# Patient Record
Sex: Male | Born: 1965 | Race: Black or African American | Hispanic: No | Marital: Married | Smoking: Never smoker
Health system: Southern US, Community
[De-identification: ages and names within clinical notes are randomized; demographics above are authoritative.]

## PROBLEM LIST (undated history)

## (undated) DIAGNOSIS — E11621 Type 2 diabetes mellitus with foot ulcer: Secondary | ICD-10-CM

## (undated) DIAGNOSIS — Z8619 Personal history of other infectious and parasitic diseases: Secondary | ICD-10-CM

## (undated) DIAGNOSIS — E119 Type 2 diabetes mellitus without complications: Secondary | ICD-10-CM

## (undated) DIAGNOSIS — J189 Pneumonia, unspecified organism: Secondary | ICD-10-CM

## (undated) DIAGNOSIS — H53002 Unspecified amblyopia, left eye: Secondary | ICD-10-CM

## (undated) DIAGNOSIS — I2510 Atherosclerotic heart disease of native coronary artery without angina pectoris: Secondary | ICD-10-CM

## (undated) DIAGNOSIS — Z87442 Personal history of urinary calculi: Secondary | ICD-10-CM

## (undated) DIAGNOSIS — N182 Chronic kidney disease, stage 2 (mild): Secondary | ICD-10-CM

## (undated) DIAGNOSIS — E1165 Type 2 diabetes mellitus with hyperglycemia: Secondary | ICD-10-CM

## (undated) DIAGNOSIS — I428 Other cardiomyopathies: Secondary | ICD-10-CM

## (undated) DIAGNOSIS — I5022 Chronic systolic (congestive) heart failure: Secondary | ICD-10-CM

## (undated) DIAGNOSIS — I509 Heart failure, unspecified: Secondary | ICD-10-CM

## (undated) DIAGNOSIS — Z86718 Personal history of other venous thrombosis and embolism: Secondary | ICD-10-CM

## (undated) DIAGNOSIS — I10 Essential (primary) hypertension: Secondary | ICD-10-CM

## (undated) HISTORY — DX: Type 2 diabetes mellitus without complications: E11.9

## (undated) HISTORY — DX: Essential (primary) hypertension: I10

## (undated) HISTORY — DX: Personal history of urinary calculi: Z87.442

## (undated) HISTORY — PX: EYE SURGERY: SHX253

## (undated) HISTORY — DX: Chronic kidney disease, stage 2 (mild): N18.2

## (undated) HISTORY — DX: Chronic systolic (congestive) heart failure: I50.22

## (undated) HISTORY — DX: Other cardiomyopathies: I42.8

## (undated) HISTORY — DX: Type 2 diabetes mellitus with foot ulcer: E11.621

## (undated) HISTORY — DX: Atherosclerotic heart disease of native coronary artery without angina pectoris: I25.10

## (undated) HISTORY — DX: Unspecified amblyopia, left eye: H53.002

## (undated) HISTORY — DX: Personal history of other infectious and parasitic diseases: Z86.19

## (undated) HISTORY — DX: Personal history of other venous thrombosis and embolism: Z86.718

## (undated) HISTORY — DX: Type 2 diabetes mellitus with hyperglycemia: E11.65

---

## 2011-05-29 ENCOUNTER — Emergency Department (HOSPITAL_COMMUNITY): Payer: Self-pay

## 2011-05-29 ENCOUNTER — Emergency Department (HOSPITAL_COMMUNITY)
Admission: EM | Admit: 2011-05-29 | Discharge: 2011-05-29 | Disposition: A | Discharge status: Home / Self Care | Payer: Self-pay | Attending: Emergency Medicine | Admitting: Emergency Medicine

## 2011-05-29 ENCOUNTER — Encounter (HOSPITAL_COMMUNITY): Payer: Self-pay | Admitting: Emergency Medicine

## 2011-05-29 DIAGNOSIS — W1789XA Other fall from one level to another, initial encounter: Secondary | ICD-10-CM | POA: Insufficient documentation

## 2011-05-29 DIAGNOSIS — M79603 Pain in arm, unspecified: Secondary | ICD-10-CM

## 2011-05-29 DIAGNOSIS — J45909 Unspecified asthma, uncomplicated: Secondary | ICD-10-CM | POA: Insufficient documentation

## 2011-05-29 DIAGNOSIS — M79609 Pain in unspecified limb: Secondary | ICD-10-CM | POA: Insufficient documentation

## 2011-05-29 MED ORDER — IBUPROFEN 800 MG PO TABS
800.0000 mg | ORAL_TABLET | Freq: Once | ORAL | Status: AC
  Administered 2011-05-29: 800 mg via ORAL
  Filled 2011-05-29: qty 1

## 2011-05-29 NOTE — Discharge Instructions (Signed)
Please use splint and ibuprofen. Decreased the use of the left upper extremity. Call Dr. Mina Marble' S office for followup if the immobilization does not decrease the pain in 2-3 days.   Return if worse at any time especially if you have increased redness swelling or line up the arm.Hypertension As your heart beats, it forces blood through your arteries. This force is your blood pressure. If the pressure is too high, it is called hypertension (HTN) or high blood pressure. HTN is dangerous because you may have it and not know it. High blood pressure may mean that your heart has to work harder to pump blood. Your arteries may be narrow or stiff. The extra work puts you at risk for heart disease, stroke, and other problems.  Blood pressure consists of two numbers, a higher number over a lower, 110/72, for example. It is stated as "110 over 72." The ideal is below 120 for the top number (systolic) and under 80 for the bottom (diastolic). Write down your blood pressure today. You should pay close attention to your blood pressure if you have certain conditions such as:  Heart failure.   Prior heart attack.   Diabetes   Chronic kidney disease.   Prior stroke.   Multiple risk factors for heart disease.  To see if you have HTN, your blood pressure should be measured while you are seated with your arm held at the level of the heart. It should be measured at least twice. A one-time elevated blood pressure reading (especially in the Emergency Department) does not mean that you need treatment. There may be conditions in which the blood pressure is different between your right and left arms. It is important to see your caregiver soon for a recheck. Most people have essential hypertension which means that there is not a specific cause. This type of high blood pressure may be lowered by changing lifestyle factors such as:  Stress.   Smoking.   Lack of exercise.   Excessive weight.   Drug/tobacco/alcohol  use.   Eating less salt.  Most people do not have symptoms from high blood pressure until it has caused damage to the body. Effective treatment can often prevent, delay or reduce that damage. TREATMENT  When a cause has been identified, treatment for high blood pressure is directed at the cause. There are a large number of medications to treat HTN. These fall into several categories, and your caregiver will help you select the medicines that are best for you. Medications may have side effects. You should review side effects with your caregiver. If your blood pressure stays high after you have made lifestyle changes or started on medicines,   Your medication(s) may need to be changed.   Other problems may need to be addressed.   Be certain you understand your prescriptions, and know how and when to take your medicine.   Be sure to follow up with your caregiver within the time frame advised (usually within two weeks) to have your blood pressure rechecked and to review your medications.   If you are taking more than one medicine to lower your blood pressure, make sure you know how and at what times they should be taken. Taking two medicines at the same time can result in blood pressure that is too low.  SEEK IMMEDIATE MEDICAL CARE IF:  You develop a severe headache, blurred or changing vision, or confusion.   You have unusual weakness or numbness, or a faint feeling.   You  have severe chest or abdominal pain, vomiting, or breathing problems.  MAKE SURE YOU:   Understand these instructions.   Will watch your condition.   Will get help right away if you are not doing well or get worse.  Document Released: 03/11/2005 Document Revised: 02/28/2011 Document Reviewed: 10/30/2007 [facility] Patient Information 2012 [place], [place].

## 2011-05-29 NOTE — ED Notes (Signed)
PT. REPORTS LEFT ARM PAIN ONSET 2 WEEKS AGO , FELL ON HIS RIGHT SIDE 2 WEEKS AGO .

## 2011-05-29 NOTE — ED Provider Notes (Addendum)
History     CSN: [id number]  Arrival date & time 05/29/11  0705   First MD Initiated Contact with Patient 05/29/11 [phone]      Chief Complaint  Patient presents with  . Arm Pain    (Consider location/radiation/quality/duration/timing/severity/associated sxs/prior treatment) HPI  Patient injured left arm.  Patient fell about three weeks ago but states it was on other side.  Pain in left arm began about a week ago.  Pain began gradually now worse for 1-2 days.  Patient states he was using muscle rub which helped initially.  Last night throbbing and couldn't lift.  Some swelling and warmth, no redness.  No recent trauma to area- denies cuts or needle sticks.  Pain in anterior aspect of left elbow fold.  Patient is left hand dominant. Pain "not bad" right now.  Improves with heat pad or rub.  Worsens with movement and palpation.    Past Medical History  Diagnosis Date  . Asthma     Past Surgical History  Procedure Date  . Eye surgery     No family history on file.  History  Substance Use Topics  . Smoking status: Never Smoker   . Smokeless tobacco: Not on file  . Alcohol Use: No      Review of Systems  All other systems reviewed and are negative.    Allergies  Review of patient's allergies indicates no known allergies.  Home Medications  No current outpatient prescriptions on file.  BP 157/101  Pulse 83  Temp(Src) 98.1 F (36.7 C) (Oral)  Resp 16  SpO2 96%  Physical Exam  Nursing note and vitals reviewed. Constitutional: He is oriented to person, place, and time. He appears well-developed and well-nourished.  HENT:  Head: Normocephalic.  Eyes: Conjunctivae and EOM are normal. Pupils are equal, round, and reactive to light.  Cardiovascular: Normal rate and regular rhythm.   Musculoskeletal:       Patient with some possible swelling in the medial aspect of the left anterior superior forearm. Radial pulses are 2+. Sensation is intact. He has good wrist and  finger movement. Have some increase in the pain with flexion and extension of the left wrist.  Neurological: He is alert and oriented to person, place, and time.  Skin: Skin is warm and dry.  Psychiatric: He has a normal mood and affect.    ED Course  Procedures (including critical care time)  Labs Reviewed - No data to display No results found.   No diagnosis found.  No information on file. Dg Forearm Left  05/29/2011  *RADIOLOGY REPORT*  Clinical Data: Arm pain.  LEFT FOREARM - 2 VIEW  Comparison: No priors.  Findings: AP and lateral views of the left radius and ulna demonstrates no definite acute fracture or soft tissue abnormality. There is a small well corticated bony fragment medial to the trochlea.  IMPRESSION: 1.  No acute radiographic abnormality of the bones of the left forearm. 2.  Small well-corticated bony fragment medial to the trochlea, likely reflects sequelae of remote avulsion type fracture.  To ensure that this is not related to an acute traumatic injury, correlation with physical exam findings is recommended.  Original Report Authenticated By: Florencia Reasons, M.D.   Dg Humerus Left  05/29/2011  *RADIOLOGY REPORT*  Clinical Data: Left-sided arm pain.  LEFT HUMERUS - 2+ VIEW  Comparison: No priors.  Findings: AP and lateral views of the left humerus demonstrate no definite acute fracture.  Medial to the trochlea  there is a small well corticated bony fragment, that likely reflects sequelae of remote avulsion type fracture.  IMPRESSION: 1.  No definite acute radiographic abnormality of the left humerus. 2.  Small well-corticated bony fragment medial to the trochlea, likely reflects sequelae of remote avulsion type fracture.  To ensure that this is not related to an acute traumatic injury, correlation with physical exam findings is recommended.  Original Report Authenticated By: Florencia Reasons, M.D.    MDM  Patient without any evidence of osteo-or fracture related to this  pain. The well-corticated bony fragment does not correlate with where his current pain is. Patient pain appears most severe with flexion and extension of the wrist. He does do pushing for work and this may be an overuse injury with muscle inflammation. He is placed in a splint to decrease this. He will be placed in a splint and sent to the hand surgeon for followup Patient is also hypertensive here and is advised close followup.      Hilario Quarry, MD 05/29/11 [id number]  Hilario Quarry, MD 05/29/11 1324

## 2011-05-29 NOTE — ED Notes (Signed)
Patient states his left arm started to hurt approx. 1 week ago. Patient states it feels like a pulled muscle. Patient denies any accident or activity. Left appears to be slightly swollen and warm to touch. EDP at bedside.

## 2011-05-29 NOTE — Progress Notes (Signed)
Orthopedic Tech Progress Note Patient Details:  John [name] 11/14/1965 [id number]  Other Ortho Devices Ortho Device Location: wrist splint  Ortho Device Interventions: Application   Cammer, Mickie Bail 05/29/2011, 8:50 AM

## 2011-05-30 NOTE — ED Notes (Signed)
PA Drucie Opitz did not feel like it was practical to give him a leave of absences for a week when [employer] should be able to offer light duty.There is also a question about the name being different on the FMLA vs EPIC chart.So at this time we are unable to fill out FMLA papers .

## 2011-06-11 ENCOUNTER — Emergency Department (HOSPITAL_COMMUNITY)
Admission: EM | Admit: 2011-06-11 | Discharge: 2011-06-11 | Disposition: A | Discharge status: Home / Self Care | Payer: Self-pay | Attending: Emergency Medicine | Admitting: Emergency Medicine

## 2011-06-11 DIAGNOSIS — Z0389 Encounter for observation for other suspected diseases and conditions ruled out: Secondary | ICD-10-CM | POA: Insufficient documentation

## 2011-06-11 NOTE — ED Notes (Signed)
Pt wanted FMLA papers filled out and signed from 05/29/11 til today.  Spoke with Patti in [employer] office who had already talked to this pt.  Explained to Dr. Nino Parsley who st's he will not sign.  Advised pt of this and he walked out

## 2012-03-19 ENCOUNTER — Encounter (HOSPITAL_COMMUNITY): Payer: Self-pay | Admitting: *Deleted

## 2012-03-19 ENCOUNTER — Telehealth: Payer: Self-pay | Admitting: Internal Medicine

## 2012-03-19 ENCOUNTER — Inpatient Hospital Stay (HOSPITAL_COMMUNITY)
Admission: EM | Admit: 2012-03-19 | Discharge: 2012-03-19 | DRG: 301 | Disposition: A | Discharge status: Home Health | Payer: MEDICAID | Attending: Emergency Medicine | Admitting: Emergency Medicine

## 2012-03-19 DIAGNOSIS — IMO0001 Reserved for inherently not codable concepts without codable children: Secondary | ICD-10-CM | POA: Diagnosis present

## 2012-03-19 DIAGNOSIS — M79609 Pain in unspecified limb: Secondary | ICD-10-CM

## 2012-03-19 DIAGNOSIS — J45909 Unspecified asthma, uncomplicated: Secondary | ICD-10-CM | POA: Diagnosis present

## 2012-03-19 DIAGNOSIS — M7989 Other specified soft tissue disorders: Secondary | ICD-10-CM

## 2012-03-19 DIAGNOSIS — Z86718 Personal history of other venous thrombosis and embolism: Secondary | ICD-10-CM | POA: Diagnosis present

## 2012-03-19 DIAGNOSIS — I824Y9 Acute embolism and thrombosis of unspecified deep veins of unspecified proximal lower extremity: Principal | ICD-10-CM | POA: Diagnosis present

## 2012-03-19 DIAGNOSIS — I82409 Acute embolism and thrombosis of unspecified deep veins of unspecified lower extremity: Secondary | ICD-10-CM

## 2012-03-19 LAB — CBC
HCT: 41.4 % (ref 39.0–52.0)
Hemoglobin: 14.7 g/dL (ref 13.0–17.0)
RBC: 4.76 MIL/uL (ref 4.22–5.81)

## 2012-03-19 LAB — HEMOGLOBIN A1C
Hgb A1c MFr Bld: 14.1 % — ABNORMAL HIGH (ref <5.7)
Mean Plasma Glucose: 358 mg/dL — ABNORMAL HIGH (ref <117)

## 2012-03-19 LAB — COMPREHENSIVE METABOLIC PANEL
ALT: 29 U/L (ref 0–53)
Alkaline Phosphatase: 123 U/L — ABNORMAL HIGH (ref 39–117)
CO2: 25 mEq/L (ref 19–32)
Chloride: 102 mEq/L (ref 96–112)
GFR calc Af Amer: >90 mL/min (ref >90)
GFR calc non Af Amer: >90 mL/min (ref >90)
Glucose, Bld: 311 mg/dL — ABNORMAL HIGH (ref 70–99)
Potassium: 4 mEq/L (ref 3.5–5.1)
Sodium: 139 mEq/L (ref 135–145)
Total Bilirubin: 0.7 mg/dL (ref 0.3–1.2)

## 2012-03-19 LAB — PROTIME-INR
INR: 1 (ref 0.00–1.49)
Prothrombin Time: 13.1 seconds (ref 11.6–15.2)

## 2012-03-19 MED ORDER — WARFARIN VIDEO: Freq: Once | Status: DC

## 2012-03-19 MED ORDER — SODIUM CHLORIDE 0.9 % IJ SOLN: 3.0000 mL | Freq: Two times a day (BID) | INTRAMUSCULAR | Status: DC

## 2012-03-19 MED ORDER — ACETAMINOPHEN 325 MG PO TABS: 650.0000 mg | ORAL_TABLET | Freq: Four times a day (QID) | ORAL | Status: DC | PRN

## 2012-03-19 MED ORDER — COUMADIN BOOK
Freq: Once | Status: AC
  Administered 2012-03-19: 12:00:00
  Filled 2012-03-19: qty 1

## 2012-03-19 MED ORDER — SODIUM CHLORIDE 0.9 % IV SOLN: 250.0000 mL | INTRAVENOUS | Status: DC | PRN

## 2012-03-19 MED ORDER — IBUPROFEN 800 MG PO TABS: 800.0000 mg | ORAL_TABLET | Freq: Three times a day (TID) | ORAL | Status: DC | PRN

## 2012-03-19 MED ORDER — ACETAMINOPHEN 650 MG RE SUPP: 650.0000 mg | Freq: Four times a day (QID) | RECTAL | Status: DC | PRN

## 2012-03-19 MED ORDER — IBUPROFEN 800 MG PO TABS
800.0000 mg | ORAL_TABLET | Freq: Once | ORAL | Status: AC
  Administered 2012-03-19: 800 mg via ORAL
  Filled 2012-03-19: qty 1

## 2012-03-19 MED ORDER — ENOXAPARIN SODIUM 100 MG/ML ~~LOC~~ SOLN: 1.0000 mg/kg | Freq: Two times a day (BID) | SUBCUTANEOUS | Status: DC

## 2012-03-19 MED ORDER — HEPARIN (PORCINE) IN NACL 100-0.45 UNIT/ML-% IJ SOLN
1300.0000 [IU]/h | INTRAMUSCULAR | Status: DC
  Filled 2012-03-19: qty 250

## 2012-03-19 MED ORDER — ENOXAPARIN SODIUM 120 MG/0.8ML ~~LOC~~ SOLN
120.0000 mg | SUBCUTANEOUS | Status: DC
  Administered 2012-03-19: 120 mg via SUBCUTANEOUS
  Filled 2012-03-19: qty 0.8

## 2012-03-19 MED ORDER — SODIUM CHLORIDE 0.9 % IJ SOLN: 3.0000 mL | INTRAMUSCULAR | Status: DC | PRN

## 2012-03-19 MED ORDER — WARFARIN - PHARMACIST DOSING INPATIENT: Freq: Every day | Status: DC

## 2012-03-19 MED ORDER — WARFARIN SODIUM 10 MG PO TABS
10.0000 mg | ORAL_TABLET | ORAL | Status: AC
  Administered 2012-03-19: 10 mg via ORAL
  Filled 2012-03-19: qty 1

## 2012-03-19 MED ORDER — HEPARIN BOLUS VIA INFUSION: 5000.0000 [IU] | Freq: Once | INTRAVENOUS | Status: DC

## 2012-03-19 MED ORDER — WARFARIN SODIUM 10 MG PO TABS
10.0000 mg | ORAL_TABLET | Freq: Once | ORAL | Status: DC
  Filled 2012-03-19: qty 1

## 2012-03-19 MED ORDER — ENOXAPARIN (LOVENOX) PATIENT EDUCATION KIT
PACK | Freq: Once | Status: AC
  Administered 2012-03-19: 12:00:00
  Filled 2012-03-19: qty 1

## 2012-03-19 NOTE — Progress Notes (Signed)
   CARE MANAGEMENT ED NOTE 03/19/2012  Patient:  John [name], John [name]   Account Number:  [id number]  Date Initiated:  03/19/2012  Documentation initiated by:  Fransico Michael  Subjective/Objective Assessment:   presented to ED with c/o leg pain     Subjective/Objective Assessment Detail:     Action/Plan:   Action/Plan Detail:   Anticipated DC Date:  03/19/2012     Status Recommendation to Physician:   Result of Recommendation:      DC Planning Services  CM consult  MATCH Program    Choice offered to / List presented to:            Status of service:    ED Comments:   ED Comments Detail:  03/19/12-1037-J.Zunaira Lamy,RN,BSN [phone]       Notified that paitent is being sent home on lovenox bridge to coumadin. Patient is self pay. In to speak to patient. Denies ability to pay for lovenox. Requesting assistance. Approved through [facility] program. Letter given to patient with instructions regarding copay and getting medication filled. Patient voiced understanding. No further needs identified at this time.

## 2012-03-19 NOTE — Progress Notes (Signed)
ANTICOAGULATION CONSULT NOTE - Initial Consult  Pharmacy Consult for Heparin Indication: DVT  No Known Allergies  Patient Measurements: Height: 5\' 10"  (177.8 cm) Weight: 180 lb (81.647 kg) IBW/kg (Calculated) : 73   Vital Signs: Temp: 97.8 F (36.6 C) (12/26 0739) Temp src: Oral (12/26 0739) BP: 150/99 mmHg (12/26 0739)  Labs:  Basename 03/19/12 0914  HGB 14.7  HCT 41.4  PLT 164  APTT --  LABPROT --  INR --  HEPARINUNFRC --  CREATININE --  CKTOTAL --  CKMB --  TROPONINI --    CrCl is unknown because no creatinine reading has been taken.   Medical History: Past Medical History  Diagnosis Date  . Asthma     Medications:  No home meds  Assessment: 46 y.o. male presents with LLE DVT. To begin heparin for VTE treatment. Pt on no meds at home. Baseline labs pending.  Goal of Therapy:  Heparin level 0.3-0.7 units/ml Monitor platelets by anticoagulation protocol: Yes   Plan:  1. Heparin IV bolus 5000 units 2. Heparin gtt at 1300 units/hr 3. F/u 6 hr heparin level 4. Daily heparin level and CBC 5. F/u start of po anticoagulation  Christoper Fabian, PharmD, BCPS Clinical pharmacist, pager [phone] 03/19/2012,9:53 AM

## 2012-03-19 NOTE — Telephone Encounter (Signed)
Internal Medicine Teaching Service Telephone Note:  Called Mr. Cherubini this afternoon to go over the results of his HbA1c that we checked in the ED this morning.  A1C was 14.1 with random glucose check of 311 on CMET.  This is new diagnosis of DM and he will need to be started on medication and set up with diabetes education and counselor asap along with establishing a pcp.  He was advised to call back the hospital operator and ask for IMTS.    He is supposed to come to [facility] tomorrow for INR check.  I will try to reach him then or speak to someone from clinic who can give him his starting medication and establish follow up.    -will send message to [facility] for possible discussion with him tomorrow -will send message to front desk as well -recommend starting with Metformin 500mg  PO BID, however, he likely needs insulin therapy at this point but he DOES NOT like needles at all so this will take some time and discussion and understanding.   -he does not have insurance at this time and pharmacy listed on EPIC is [facility], however, metformin will be more affordable at [facility] since it is on the $4 list.    [name]

## 2012-03-19 NOTE — Progress Notes (Addendum)
Pharmacy: Heparin --> lovenox, coumadin  To change heparin to lovenox per MD's request for LLE DVT.  Patient received heparin 5000 units bolus in the ED at 10:20 AM (heparin drip not started yet).  Will start lovenox 120mg  SQ q24h (~1.5mg kg/day) about one hour after heparin bolus dose and give coumadin 10mg  PO x1 today.  Dorna Leitz, PharmD, BCPS  ____________  Adden: Patient to be discharged home today per IM team.  Recom. lovenox 120mg  SQ q24 daily and coumadin 10mg  daily for discharge until patient is able to get INR check. Educated patient on coumadin (SE, indication, drug intxn, f/u INR, etc)

## 2012-03-19 NOTE — ED Notes (Signed)
Admitting MD made aware of heparin bolus

## 2012-03-19 NOTE — Progress Notes (Signed)
Right:  No evidence of DVT, superficial thrombosis, or Baker's cyst.  Left: DVT noted in the popliteal and posterior tibial veins.  No evidence of superficial thrombosis.  No Baker's cyst.

## 2012-03-19 NOTE — Consult Note (Signed)
Internal Medicine Teaching Service Consult Note--[place] Date: 03/19/2012  Chief Complaint: LLE pain  History of Present Illness: John [name] is a 46 year old African American male presenting with complaint of worsening LLE pain and swelling x5 days.  He explains that he woke up Sunday 03/15/12 morning with pain in his LLE that felt like a cramp.  He continued with his day and ended up driving to [facility] later that afternoon.  He says by the time he reached [place], his pain was so bad that he could not walk.  He subsequently just let his legs rest at home, applied heat which helped with pain and swelling. He drove back to [place] later that night and his condition improved until Tuesday, 03/17/12, when he starting having crampy pain in his left leg again.  He notes the leg to feel warmer than the right and swelling around his ankles.  The pain was worsening with his girlfriend massaging his leg, but improved with heat and applying icy hot.  He denies any prior similar episodes or family history of any blood clots or abnormal bleeding.  He denies smoking cigarettes, alcohol use, or any illicit drug use.  He works in [employer] and is fairly active.  He currently denies any shortness of breath, chest pain, headaches, light-headedness, syncope, fever, chills, N/V/D, pain in his right leg, or any urinary complaints at this time.    In ED: Vascular lab doppler study prelim result: Right: No evidence of DVT, superficial thrombosis, or Baker's cyst. Left: DVT noted in the popliteal and posterior tibial veins. No evidence of superficial thrombosis. No Baker's cyst.   Of note, he received 5000 unit bolus of Heparin in ED @1020am  prior to order being changed to Lovenox (120mg  sq q24h ~1.5mg /kg/day).    Meds: Current Outpatient Rx  Name  Route  Sig  Dispense  Refill  . ENOXAPARIN SODIUM 100 MG/ML [place] SOLN   Subcutaneous   Inject 0.8 mLs (80 mg total) into the skin every 12 (twelve) hours.   20 mL   0     Allergies: Allergies as of 03/19/2012  . (No Known Allergies)   Past Medical History  Diagnosis Date  . Asthma    Past Surgical History  Procedure Date  . Eye surgery    No family history on file. History   Social History  . Marital Status: Single    Spouse Name: N/A    Number of Children: N/A  . Years of Education: N/A   Occupational History  . Not on file.   Social History Main Topics  . Smoking status: Never Smoker   . Smokeless tobacco: Not on file  . Alcohol Use: No  . Drug Use: No  . Sexually Active:    Other Topics Concern  . Not on file   Social History Narrative  . No narrative on file   Review of Systems: Pertinent items are noted in HPI.  Physical Exam: Blood pressure 150/99, temperature 97.8 F (36.6 C), temperature source Oral, resp. rate 20, height 5\' 10"  (1.778 m), weight 180 lb (81.647 kg), SpO2 99.00%. Vitals reviewed. General: sitting up in bed, NAD HEENT: PERRLA, EOMI, no scleral icterus Cardiac: RRR, no rubs, murmurs or gallops Pulm: clear to auscultation bilaterally, no wheezes, rales, or rhonchi Abd: soft, nontender, nondistended, BS present Ext: warm and well perfused, +2 dp b/l, +1 edema LLE, mild tenderness to deep palpation of LLE (calf), + homan's sign LLE.  No edema noted in b/l upper  extremities and RLE.   Neuro: alert and oriented X3, cranial nerves II-XII grossly intact, strength and sensation to light touch equal in bilateral upper and lower extremities  Lab results: Basic Metabolic Panel:  [facility] 03/19/12 0914  NA 139  K 4.0  CL 102  CO2 25  GLUCOSE 311*  BUN 10  CREATININE 0.78  CALCIUM 9.8  MG --  PHOS --   Liver Function Tests:  [facility] 03/19/12 0914  AST 25  ALT 29  ALKPHOS 123*  BILITOT 0.7  PROT 8.7*  ALBUMIN 4.0   CBC:  Basename 03/19/12 0914  WBC 8.7  NEUTROABS --  HGB 14.7  HCT 41.4  MCV 87.0  PLT 164   Coagulation:  Basename 03/19/12 0914  LABPROT 13.1  INR 1.00   Assessment  & Plan by Problem: Mr. Vandergriff is a 46 year old presenting with LLE and found to have LDVT in popliteal and posterior tibial veins.    Principal Problem:  *DVT (deep venous thrombosis)--idiopathic.  LLE pain since Sunday 03/15/12.  Evaluated Mr. Barfield alongside resident (Dr. Allena Katz) and attending (Dr. Rogelia Boga).  Does not seem to have any risk factors or family history of clots.  Does not smoke, drink alcohol, or use illicit drugs.  Works at [employer] and is fairly active.  He denies any shortness of breath, chest pain, headaches, light-headedness, or syncopal episodes.  Drove to [facility] on Sunday after waking up with the pain and his only other long drive was last month also to [place].  He would like to be managed for this DVT as an outpatient and not be formally admitted to the hospital today. His girlfriend will come to meet him in the ED this afternoon and will get coaching alongside him for proper administration of Lovenox injections.  Will not do hypercoagulable panel at this time as it will be unhelpful to conduct this in an acute setting and may cause inaccurate values.   He is recommended to follow up with pcp and may consider complete and further work up as an outpatient after initial treatment.    -was given 5000unit bolus of Heparin in ED today.  Per pharmacy will receive Lovenox 120mg  SQ Q24H (~1.5mg /kg/day) and Coumadin 10mg  PO x1  today prior to leaving the ED. -coaching for proper administration of Lovenox to girlfriend and Mr. Klemens prior to leaving home from ED -discharge Mr. Hacker to home with prescription for Lovenox 120mg  SQ QD and coumadin 10mg  daily until INR check.  INR check should be done tomorrow in Internal Medicine out patient clinic.  Message has been sent to the front desk of the clinic for lab visit and contact information for the clinic will be provided to Mr. Pitre as well.    -f/u pcp  Dispo: Will be discharged from ED to home today with Lovenox and Coumadin  prescription with assistance of Match Program.  He will need to follow up in IM [facility] tomorrow, 03/19/12 for INR check and adjustment of coumadin as needed.     The patient does not have a current PCP (Default, Provider, MD), therefore will be requiring OPC follow-up after discharge.   The patient does not have transportation limitations that hinder transportation to clinic appointments.  SignedDarden Palmer 03/19/2012, 10:39 AM

## 2012-03-19 NOTE — Consult Note (Signed)
Internal Medicine Teaching Service Attending Note Date: 03/19/2012  Patient name: John [name]  Medical record number: [id number]  Date of birth: October 13, 1965   I have seen and evaluated Lacretia Leigh and discussed their care with the Residency Team. Please see Dr Waynard Reeds H&P for full details. Mr Fawver has no sig PMHx and present with L leg pain. Doppler showed L DVT of popliteal and posterior tibial veins. He has no personal or fam hx of VTE. He has no CP, SOB, palp. His only RF were drives to [facility].  He has no personal or fam hx of Dm. His random CBG was in the 300's. AiC is 14.1.   He has a girlfriend who works in [employer]. She is willing to admin Lovenox shots. He has no insurance but Dr Allena Katz arranged for him to be given lovenox and he can afford $4 for the warfarin.   PMHx, meds, allergies, soc hx, fam hx, and ROS were reviewed.  Exam : Vitals reviewed and stable L leg no sig increase in size. No edema. No tenderness to palp  Labs and imaging were reviewed   Assessment and Plan: I agree with the formulated Assessment and Plan with the following changes:   1. L acute DVT - can be tx as outpt with Lovenox for min of 5 days and warfarin for 6 month. Hypercoag testing not performed in acute setting and lack of insurance as will not change initial mgmt. Can be discussed in 6 months.  2. New onset DM II uncontrolled - start metformin and glucotrol. Will need close and intensive outpt mgmt and teaching.  Burns Spain, MD 12/26/20134:03 PM

## 2012-03-19 NOTE — ED Notes (Signed)
Patient and s/o received lovenox administration instructions with verbalized understanding and hands on skill training

## 2012-03-19 NOTE — ED Notes (Signed)
Patient with cramping in left leg x 4 days

## 2012-03-19 NOTE — ED Notes (Signed)
Patient received 5000 unit bolus bolus prior to orders d/c, Scarlett Presto, RN second nurse at bedside for verification

## 2012-03-19 NOTE — ED Provider Notes (Signed)
History     CSN: [id number]  Arrival date & time 03/19/12  5284   First MD Initiated Contact with Patient 03/19/12 0734      No chief complaint on file.   (Consider location/radiation/quality/duration/timing/severity/associated sxs/prior treatment) HPI Comments: 46 year old male presents to the emergency department complaining of cramping and swelling in his left leg since Sunday morning. States he woke up with his legs feeling crampy, then went on a car ride to [place] when the pain got worse. He has tried elevating, icing and heating his leg along with Aleve with only temporary relief. Pain rated 8/10, increasing to 10 out of 10 at night. Denies chest pain, shortness of breath or heart palpitations. No fever or chills. No numbness or tingling in his lower extremity. No personal or family history of blood clots. Patient works at [employer] and is on his feet all day long.  The history is provided by the patient.    Past Medical History  Diagnosis Date  . Asthma     Past Surgical History  Procedure Date  . Eye surgery     No family history on file.  History  Substance Use Topics  . Smoking status: Never Smoker   . Smokeless tobacco: Not on file  . Alcohol Use: No      Review of Systems  Constitutional: Negative for fever and chills.  HENT: Negative.   Respiratory: Negative for shortness of breath.   Cardiovascular: Positive for leg swelling. Negative for chest pain and palpitations.  Gastrointestinal: Negative for nausea and vomiting.  Musculoskeletal: Positive for myalgias (left calf pain).  Skin: Negative for color change.  Neurological: Negative for numbness.  Hematological: Does not bruise/bleed easily.  Psychiatric/Behavioral: The patient is not nervous/anxious.     Allergies  Review of patient's allergies indicates no known allergies.  Home Medications  No current outpatient prescriptions on file.  There were no vitals taken for this  visit.  Physical Exam  Nursing note and vitals reviewed. Constitutional: He is oriented to person, place, and time. He appears well-developed and well-nourished. No distress.  HENT:  Head: Normocephalic and atraumatic.  Eyes: Conjunctivae normal are normal.  Neck: Normal range of motion. Neck supple.  Cardiovascular: Normal rate, regular rhythm, normal heart sounds and intact distal pulses.   Pulses:      Dorsalis pedis pulses are 2+ on the right side, and 2+ on the left side.       Posterior tibial pulses are 2+ on the right side, and 2+ on the left side.  Pulmonary/Chest: Effort normal and breath sounds normal. He has no decreased breath sounds.  Musculoskeletal: Normal range of motion.       Left lower leg: He exhibits tenderness and swelling.       Legs:      L calf measures 41 cm compared to R calf measuring 39 cm.  Neurological: He is alert and oriented to person, place, and time.  Skin: Skin is warm and dry. No erythema.  Psychiatric: He has a normal mood and affect. His behavior is normal.    ED Course  Procedures (including critical care time)  Labs Reviewed - No data to display No results found.   1. DVT (deep venous thrombosis)       MDM  46 year old male with DVT seen on lower extremity duplex ultrasound. Patient has no risk factors for developing DVT. Case has been discussed with Dr. Silverio Lay feels admission for further workup is necessary at this  time. Heparin will be started per Dr. Silverio Lay. Labs drawn including CBC, CMP and PT/INR. Spoke with Dr. Allena Katz with teaching service who will evaluate patient for admission.   10:40 AM Teaching service spoke with their attending who states patient can be discharged with follow up both tomorrow and next week in the clinic where a coagulation workup can be obtained. He will begin on lovenox rather than heparin.    Trevor Mace, PA-C 03/19/12 0908  Trevor Mace, PA-C 03/19/12 1041

## 2012-03-20 ENCOUNTER — Encounter: Payer: Self-pay | Admitting: Dietician

## 2012-03-20 ENCOUNTER — Telehealth: Payer: Self-pay | Admitting: Internal Medicine

## 2012-03-20 ENCOUNTER — Other Ambulatory Visit (INDEPENDENT_AMBULATORY_CARE_PROVIDER_SITE_OTHER): Payer: Self-pay

## 2012-03-20 ENCOUNTER — Other Ambulatory Visit: Payer: Self-pay | Admitting: Internal Medicine

## 2012-03-20 ENCOUNTER — Encounter: Payer: Self-pay | Admitting: Internal Medicine

## 2012-03-20 ENCOUNTER — Ambulatory Visit (INDEPENDENT_AMBULATORY_CARE_PROVIDER_SITE_OTHER): Payer: Self-pay | Admitting: Internal Medicine

## 2012-03-20 VITALS — BP 156/98 | HR 90 | Temp 97.4°F | Ht 70.9 in | Wt 190.5 lb

## 2012-03-20 DIAGNOSIS — I82409 Acute embolism and thrombosis of unspecified deep veins of unspecified lower extremity: Secondary | ICD-10-CM

## 2012-03-20 DIAGNOSIS — I82402 Acute embolism and thrombosis of unspecified deep veins of left lower extremity: Secondary | ICD-10-CM

## 2012-03-20 DIAGNOSIS — E119 Type 2 diabetes mellitus without complications: Secondary | ICD-10-CM

## 2012-03-20 DIAGNOSIS — Z7901 Long term (current) use of anticoagulants: Secondary | ICD-10-CM

## 2012-03-20 DIAGNOSIS — E1165 Type 2 diabetes mellitus with hyperglycemia: Secondary | ICD-10-CM | POA: Insufficient documentation

## 2012-03-20 LAB — GLUCOSE, CAPILLARY

## 2012-03-20 MED ORDER — METFORMIN HCL 500 MG PO TABS: ORAL_TABLET | ORAL | Status: DC

## 2012-03-20 MED ORDER — WARFARIN SODIUM 5 MG PO TABS: ORAL_TABLET | ORAL | Status: DC

## 2012-03-20 MED ORDER — METFORMIN HCL 500 MG PO TABS: 500.0000 mg | ORAL_TABLET | Freq: Two times a day (BID) | ORAL | Status: DC

## 2012-03-20 MED ORDER — LISINOPRIL 2.5 MG PO TABS
2.5000 mg | ORAL_TABLET | Freq: Every day | ORAL | Status: DC
Start: 2012-03-20 — End: 2012-07-26

## 2012-03-20 NOTE — ED Provider Notes (Signed)
Medical screening examination/treatment/procedure(s) were performed by non-physician practitioner and as supervising physician I was immediately available for consultation/collaboration.   David H Yao, MD 03/20/12 0723 

## 2012-03-20 NOTE — Patient Instructions (Signed)
General Instructions: Please schedule a follow up appointment in 3 days with Dr. Alexandria Lodge for INR check. Please schedule a follow up with [facility] resident in 2-3 weeks Please bring your medication bottles with your next appointment. Please take your medicines as prescribed. I will call you with your lab results if anything will be abnormal.    Treatment Goals:  Goals (1 Years of Data) as of 03/20/2012          As of Today As of Today 03/19/12 06/11/11 05/29/11     Blood Pressure    . Blood Pressure < 140/90  156/98 149/100 150/99 141/99 152/99     Result Component    . HEMOGLOBIN A1C < 7.0    14.1      . LDL CALC < 130            Progress Toward Treatment Goals:    Self Care Goals & Plans:       Care Management & Community Referrals:

## 2012-03-20 NOTE — Progress Notes (Signed)
Subjective:   Patient ID: John [name] male   DOB: 1965/05/20 46 y.o.   MRN: [id number]  HPI: 46 year old man with past medical history significant for newly diagnosed left tibial and popliteal DVT and type 2 diabetes mellitus comes to the clinic as ED followup.  Patient was seen in the ER on 03/19/2012 for provoked left lower extremity DVT( risk factor-driving back and forth from [place] to [place]). He was discharged home on Lovenox and comes to the clinic today for a follow up. He still reports having pain in his left leg , rates 7/10 , describes as crampy pain associated with redness and swelling. He denies any personal or family history of blood clots.  His random blood sugar was found to be 300 and his A1c came out to be 14.1.  He states that DM runs in her maternal grandmother and aunts.    Past Medical History  Diagnosis Date  . Asthma    No family history on file. History   Social History  . Marital Status: Single    Spouse Name: N/A    Number of Children: N/A  . Years of Education: N/A   Occupational History  . Not on file.   Social History Main Topics  . Smoking status: Never Smoker   . Smokeless tobacco: Not on file  . Alcohol Use: No  . Drug Use: No  . Sexually Active: Not on file   Other Topics Concern  . Not on file   Social History Narrative  . No narrative on file   Review of Systems: General: Denies fever, chills, diaphoresis, appetite change and fatigue. HEENT: Denies photophobia, eye pain, redness, hearing loss, ear pain, congestion, sore throat, rhinorrhea, sneezing, mouth sores, trouble swallowing, neck pain, neck stiffness and tinnitus. Respiratory: Denies SOB, DOE, cough, chest tightness, and wheezing. Cardiovascular: Denies to chest pain, palpitations and leg swelling. Gastrointestinal: Denies nausea, vomiting, abdominal pain, diarrhea, constipation, blood in stool and abdominal distention. Genitourinary: Denies dysuria, urgency,  frequency, hematuria, flank pain and difficulty urinating. Musculoskeletal: Denies myalgias, back pain, joint swelling, arthralgias and gait problem.  Skin: Denies pallor, rash and wound. Neurological: Denies dizziness, seizures, syncope, weakness, light-headedness, numbness and headaches. Hematological: Denies adenopathy, easy bruising, personal or family bleeding history. Psychiatric/Behavioral: Denies suicidal ideation, mood changes, confusion, nervousness, sleep disturbance and agitation.    Current Outpatient Medications: Current Outpatient Prescriptions  Medication Sig Dispense Refill  . enoxaparin (LOVENOX) 100 MG/ML injection Inject 0.8 mLs (80 mg total) into the skin every 12 (twelve) hours.  20 mL  0  . metFORMIN (GLUCOPHAGE) 500 MG tablet Take 1 tablet (500 mg total) by mouth 2 (two) times daily with a meal.  60 tablet  3    Allergies: No Known Allergies    Objective:   Physical Exam: Filed Vitals:   03/20/12 1529  BP: 149/100  Pulse: 101  Temp: 97.4 F (36.3 C)    General: Vital signs reviewed and noted. Well-developed, well-nourished, in no acute distress; alert, appropriate and cooperative throughout examination. Walking with a limp because of left leg pain  Head: Normocephalic, atraumatic Lungs: Normal respiratory effort. Clear to auscultation BL without crackles or wheezes. Heart: RRR. S1 and S2 normal without gallop, murmur, or rubs. Abdomen:BS normoactive. Soft, Nondistended, non-tender.  No masses or organomegaly. Extremities: Left leg swelling and redness present as compared to the right side     Assessment & Plan:

## 2012-03-20 NOTE — Progress Notes (Signed)
Patient ID: John [name], male   DOB: 11-17-1965, 45 y.o.   MRN: [id number] Asked by Dr. Virgina Organ to meet with patient. Gave patient general information about diabetes, CDE phone number, answered his questions and suggested he make an appointment with CDE in the next week or so to discuss his new diagnosis and self management.

## 2012-03-20 NOTE — Addendum Note (Signed)
Addended by: Bufford Spikes on: 03/20/2012 02:50 PM   Modules accepted: Orders

## 2012-03-20 NOTE — Telephone Encounter (Signed)
Telephone call addendum:  I spoke with John [name] this afternoon.  He said he will come to clinic in the next couple of hours for INR check.  I also informed him of his elevated HbA1C and that he will need to be started on medication.  I sent his metformin prescription to [facility] that was listed on [facility] and he said that was okay.    I explained to him that he needs to establish primary care and to take his medications and learn to manage his diabetes.    He claims he has been taking his lovenox daily.    [name] 03/20/12 124pm

## 2012-03-21 LAB — MICROALBUMIN / CREATININE URINE RATIO: Microalb Creat Ratio: 118.6 mg/g — ABNORMAL HIGH (ref 0.0–30.0)

## 2012-03-22 NOTE — Assessment & Plan Note (Signed)
ER follow up for newly diagnosed left popliteal and posterior tibial vein DVT( provoked by driving back and forth from [place] to [facility]). He was discharged home on lovenox ( 80 mg BID)shots and returns to the clinic today.  - Would start him on coumadin. He was advised to take 7.5 mg of coumadin daily for 3 days and return to the clinic on Monday to see Dr. Alexandria Lodge to get his INR checked.  - Continue lovenox shots for atleast 5 days or longer until his INR is therapeutic. He may need to be on coumadin for 6 months for first provoked DVT. - Hypercoag testing not performed in acute setting and lack of insurance as will not change initial mgmt. Can be discussed in 6 months.

## 2012-03-22 NOTE — Assessment & Plan Note (Signed)
Patient is newly diagnosed Type 2 diabetic. His AIC was found to be 14.1. He was offered to be started on insulin but he wants to try oral medications first. He was seen by our diabetes educator and counselor Norm Parcel in the clinic. Appreciate her help! - Obtain micr/cr ratio. Regardless , planning to start him on ACE- I given his elevated BP.  Though ,his elevated BP could also be related to pain but I think he would benefit from low dose ACE-I given poorly controlled DM at the diagnosis making it likely to be a long standing problem already. - Start him on metformin 500 mg BID for 1 week and then increase it to 1000 mg BID  - Reschedule a follow up visit in 2-3 weeks in [facility]. At that time we may consider adding glimepiride.  - Diabetic foot exam was done today. - Lipid panel would be obtained with his visit on Monday, as per patient's preference.

## 2012-03-23 ENCOUNTER — Ambulatory Visit (INDEPENDENT_AMBULATORY_CARE_PROVIDER_SITE_OTHER): Payer: Self-pay | Admitting: Pharmacist

## 2012-03-23 DIAGNOSIS — I82409 Acute embolism and thrombosis of unspecified deep veins of unspecified lower extremity: Secondary | ICD-10-CM

## 2012-03-23 DIAGNOSIS — Z7901 Long term (current) use of anticoagulants: Secondary | ICD-10-CM

## 2012-03-23 LAB — POCT INR: INR: 1.9

## 2012-03-23 NOTE — Patient Instructions (Signed)
Patient instructed to take medications as defined in the Anti-coagulation Track section of this encounter.  Patient instructed to take today's dose.  Patient verbalized understanding of these instructions.    

## 2012-03-23 NOTE — Progress Notes (Signed)
Anti-Coagulation Progress Note  John [name] is a 46 y.o. male who is currently on an anti-coagulation regimen.    RECENT RESULTS: Recent results are below, the most recent result is correlated with a dose of 7.5 mg per day. Will increase to 10mg  QD x next 3 days; decrease to 7.5mg  warfarin QD thereafter and RTC on Monday 6-JAN-14. He will continue the Lovenox injections 80mg  SQ q12h x next 36 hours (3 doses).  Lab Results  Component Value Date   INR 1.90 03/23/2012   INR 1.1 03/20/2012   INR 1.00 03/19/2012    ANTI-COAG DOSE:   Latest dosing instructions   Total Sun Mon Tue Wed Thu Fri Sat   52.5 7.5 mg  10 mg 10 mg 10 mg 7.5 mg 7.5 mg    (5 mg1.5)  (5 mg2) (5 mg2) (5 mg2) (5 mg1.5) (5 mg1.5)         ANTICOAG SUMMARY: Anticoagulation Episode Summary              Current INR goal 2.0-3.0 Next INR check 03/30/2012   INR from last check 1.90! (03/23/2012)     Weekly max dose (mg)  Target end date 09/21/2012   Indications DVT (deep venous thrombosis) [453.40], Long term (current) use of anticoagulants [V58.61]   INR check location [facility]    Send INR reminders to    Comments Review of notes indicate this is believed to be a provoked, first known episode of VTE. Target duration of therapy found in notes indicates 6 months duration based upon these considerations.            ANTICOAG TODAY: Anticoagulation Summary as of 03/23/2012              INR goal 2.0-3.0     Selected INR 1.90! (03/23/2012) Next INR check 03/30/2012   Weekly max dose (mg)  Target end date 09/21/2012   Indications DVT (deep venous thrombosis) [453.40], Long term (current) use of anticoagulants [V58.61]    Anticoagulation Episode Summary              INR check location [facility]erred lab    Send INR reminders to    Comments Review of notes indicate this is believed to be a provoked, first known episode of VTE. Target duration of therapy found in notes indicates 6 months  duration based upon these considerations.            PATIENT INSTRUCTIONS: Patient Instructions  Patient instructed to take medications as defined in the Anti-coagulation Track section of this encounter.  Patient instructed to take today's dose.  Patient verbalized understanding of these instructions.        FOLLOW-UP Return in 7 days (on 03/30/2012) for Follow up INR at 3:30PM.  Hulen Luster, III Pharm.D., CACP

## 2012-03-30 ENCOUNTER — Ambulatory Visit (INDEPENDENT_AMBULATORY_CARE_PROVIDER_SITE_OTHER): Payer: Self-pay | Admitting: Pharmacist

## 2012-03-30 DIAGNOSIS — I82402 Acute embolism and thrombosis of unspecified deep veins of left lower extremity: Secondary | ICD-10-CM

## 2012-03-30 DIAGNOSIS — I82409 Acute embolism and thrombosis of unspecified deep veins of unspecified lower extremity: Secondary | ICD-10-CM

## 2012-03-30 DIAGNOSIS — Z7901 Long term (current) use of anticoagulants: Secondary | ICD-10-CM

## 2012-03-30 LAB — POCT INR: INR: 2.7

## 2012-03-30 MED ORDER — WARFARIN SODIUM 5 MG PO TABS: ORAL_TABLET | ORAL | Status: DC

## 2012-03-30 NOTE — Progress Notes (Signed)
Anti-Coagulation Progress Note  John [name] is a 47 y.o. male who is currently on an anti-coagulation regimen.    RECENT RESULTS: Recent results are below, the most recent result is correlated with a dose of 52.5 mg. Over 6 days. Lab Results  Component Value Date   INR 2.70 03/30/2012   INR 1.90 03/23/2012   INR 1.1 03/20/2012    ANTI-COAG DOSE:   Latest dosing instructions   Total Sun Mon Tue Wed Thu Fri Sat   60 7.5 mg 7.5 mg 10 mg 7.5 mg 10 mg 7.5 mg 10 mg    (5 mg1.5) (5 mg1.5) (5 mg2) (5 mg1.5) (5 mg2) (5 mg1.5) (5 mg2)         ANTICOAG SUMMARY: Anticoagulation Episode Summary              Current INR goal 2.0-3.0 Next INR check 04/06/2012   INR from last check 2.70 (03/30/2012)     Weekly max dose (mg)  Target end date 09/21/2012   Indications DVT (deep venous thrombosis) [453.40], Long term (current) use of anticoagulants [V58.61]   INR check location [facility]    Send INR reminders to    Comments Review of notes indicate this is believed to be a provoked, first known episode of VTE. Target duration of therapy found in notes indicates 6 months duration based upon these considerations.            ANTICOAG TODAY: Anticoagulation Summary as of 03/30/2012              INR goal 2.0-3.0     Selected INR 2.70 (03/30/2012) Next INR check 04/06/2012   Weekly max dose (mg)  Target end date 09/21/2012   Indications DVT (deep venous thrombosis) [453.40], Long term (current) use of anticoagulants [V58.61]    Anticoagulation Episode Summary              INR check location [facility]    Send INR reminders to    Comments Review of notes indicate this is believed to be a provoked, first known episode of VTE. Target duration of therapy found in notes indicates 6 months duration based upon these considerations.            PATIENT INSTRUCTIONS: Patient Instructions  Patient instructed to take medications as defined in the  Anti-coagulation Track section of this encounter.  Patient instructed to take today's dose.  Patient verbalized understanding of these instructions.        FOLLOW-UP Return in 7 days (on 04/06/2012) for Follow up INR at 3:30PM.  Hulen Luster, III Pharm.D., CACP

## 2012-03-30 NOTE — Patient Instructions (Signed)
Patient instructed to take medications as defined in the Anti-coagulation Track section of this encounter.  Patient instructed to take today's dose.  Patient verbalized understanding of these instructions.    

## 2012-04-05 ENCOUNTER — Other Ambulatory Visit: Payer: Self-pay | Admitting: Internal Medicine

## 2012-04-06 ENCOUNTER — Ambulatory Visit (INDEPENDENT_AMBULATORY_CARE_PROVIDER_SITE_OTHER): Payer: Self-pay | Admitting: Pharmacist

## 2012-04-06 ENCOUNTER — Ambulatory Visit (INDEPENDENT_AMBULATORY_CARE_PROVIDER_SITE_OTHER): Payer: Self-pay | Admitting: Internal Medicine

## 2012-04-06 VITALS — BP 138/93 | HR 101 | Temp 97.5°F | Ht 70.0 in | Wt 192.1 lb

## 2012-04-06 DIAGNOSIS — I82409 Acute embolism and thrombosis of unspecified deep veins of unspecified lower extremity: Secondary | ICD-10-CM

## 2012-04-06 DIAGNOSIS — I10 Essential (primary) hypertension: Secondary | ICD-10-CM | POA: Insufficient documentation

## 2012-04-06 DIAGNOSIS — E119 Type 2 diabetes mellitus without complications: Secondary | ICD-10-CM

## 2012-04-06 DIAGNOSIS — Z7901 Long term (current) use of anticoagulants: Secondary | ICD-10-CM

## 2012-04-06 LAB — LIPID PANEL
Cholesterol: 173 mg/dL (ref 0–200)
LDL Cholesterol: 110 mg/dL — ABNORMAL HIGH (ref 0–99)
Triglycerides: 151 mg/dL — ABNORMAL HIGH (ref <150)

## 2012-04-06 MED ORDER — GLIPIZIDE 5 MG PO TABS: 5.0000 mg | ORAL_TABLET | Freq: Two times a day (BID) | ORAL | Status: DC

## 2012-04-06 NOTE — Assessment & Plan Note (Signed)
Patient continues to do well on Coumadin. He denies any symptoms of leg swelling or leg pain today. He follows with Dr. Alexandria Lodge for his INR. He requests a refill for his Coumadin today.  -Continue Coumadin per Dr. Alexandria Lodge -Will need 6 months of anticoagulation, then reassess

## 2012-04-06 NOTE — Progress Notes (Signed)
Internal Medicine Clinic Visit    HPI:  John [name] is a 47 y.o. year old male with a recent history of DVT on Coumadin, and newly diagnosed type 2 diabetes, who presents for followup  Patient was newly diagnosed with type 2 diabetes in December 2013, A1c was found to be 14.1. He declined insulin at that time and was started on metformin.   Taking 1000 mg Metformin with lunch.   Denies changes in vision, no polyuria or polydipsia. Mouth not dry.  Has made a lot of of dietary changes since seeing [facility]. States that he has cut out fried foods, fast foods, is eating more whole gr and more vegetables. Is going to start working out soon.  Leg pain and swelling completely resolved.  No family history of blood clots  Works as a Financial risk analyst. Nonsmoker, never smoker, no etoh, no other drugs. Lives with girlfriend.    Past Medical History  Diagnosis Date  . Asthma     Past Surgical History  Procedure Date  . Eye surgery      ROS:  A complete review of systems was otherwise negative, except as noted in the HPI.  Allergies: Review of patient's allergies indicates no known allergies.  Medications: Current Outpatient Prescriptions  Medication Sig Dispense Refill  . enoxaparin (LOVENOX) 100 MG/ML injection Inject 0.8 mLs (80 mg total) into the skin every 12 (twelve) hours.  20 mL  0  . lisinopril (PRINIVIL,ZESTRIL) 2.5 MG tablet Take 1 tablet (2.5 mg total) by mouth daily.  30 tablet  3  . metFORMIN (GLUCOPHAGE) 500 MG tablet Take 1 tab by mouth twice a day for 1 week with meals and then increase it to two pills twice a day from day 8 till your next visit.  90 tablet  3  . warfarin (COUMADIN) 5 MG tablet TAKE 1 AND 1/2 TABLETS BY MOUTHJ FOR 3 DAYS THEN TAKE THE REST AS DIRECTED  30 tablet  0    History   Social History  . Marital Status: Single    Spouse Name: N/A    Number of Children: N/A  . Years of Education: N/A   Occupational History  . Not on file.   Social History  Main Topics  . Smoking status: Never Smoker   . Smokeless tobacco: Not on file  . Alcohol Use: No  . Drug Use: No  . Sexually Active: Not on file   Other Topics Concern  . Not on file   Social History Narrative  . No narrative on file    family history is not on file.  Physical Exam There were no vitals taken for this visit. General:  No acute distress, alert and oriented x 3, well-appearing  HEENT:  PERRL, EOMI, no lymphadenopathy, moist mucous membranes Cardiovascular:  Regular rate and rhythm, no murmurs, rubs or gallops Respiratory:  Clear to auscultation bilaterally, no wheezes, rales, or rhonchi Abdomen:  Soft, nondistended, nontender, normoactive bowel sounds Extremities:  Warm and well-perfused, no clubbing, cyanosis, or edema. Calf is nontender to palpation. Skin: Warm, dry, no rashes Neuro: Not anxious appearing, no depressed mood, normal affect  Labs: Lab Results  Component Value Date   CREATININE 0.78 03/19/2012   BUN 10 03/19/2012   NA 139 03/19/2012   K 4.0 03/19/2012   CL 102 03/19/2012   CO2 25 03/19/2012   Lab Results  Component Value Date   WBC 8.7 03/19/2012   HGB 14.7 03/19/2012   HCT 41.4 03/19/2012  MCV 87.0 03/19/2012   PLT 164 03/19/2012      Assessment and Plan:    FOLLOWUP: Ned Kakar will follow back up in our clinic in approximately one month. Thedore Pickel knows to call out clinic in the meantime with any questions or new issues.

## 2012-04-06 NOTE — Assessment & Plan Note (Addendum)
Patient with newly diagnosed type 2 diabetes, A1c 14.1 on 03/20/2012. He does not want to start insulin as he does not like needles. Patient was started on metformin and is now on a dose of 1000 mg BID. He denies any GI side effects from this medication at this time. Patient is already on an ACE inhibitor for hypertension. Up-to-date with exam.  -Continue maximum metformin dose to 1000 mg twice times a day -Will give new prescription for glipizide 5 mg twice a day 30 minutes before meals -Discussed use of glucometer and taking fasting blood sugars several times per week and write them in a log. Patient agrees to bring the meter and log to the next clinic visit for review. -Will check fasting lipid panel today

## 2012-04-06 NOTE — Progress Notes (Signed)
Anti-Coagulation Progress Note  John [name] is a 47 y.o. male who is currently on an anti-coagulation regimen.    RECENT RESULTS: Recent results are below, the most recent result is correlated with a dose of 62.5 mg. per week: Lab Results  Component Value Date   INR 3.20 04/06/2012   INR 2.70 03/30/2012   INR 1.90 03/23/2012    ANTI-COAG DOSE:   Latest dosing instructions   Total Sun Mon Tue Wed Thu Fri Sat   60 7.5 mg 10 mg 7.5 mg 10 mg 7.5 mg 10 mg 7.5 mg    (5 mg1.5) (5 mg2) (5 mg1.5) (5 mg2) (5 mg1.5) (5 mg2) (5 mg1.5)         ANTICOAG SUMMARY: Anticoagulation Episode Summary              Current INR goal 2.0-3.0 Next INR check 04/20/2012   INR from last check 3.20! (04/06/2012)     Weekly max dose (mg)  Target end date 09/21/2012   Indications DVT (deep venous thrombosis) [453.40], Long term (current) use of anticoagulants [V58.61]   INR check location [facility] Preferred lab    Send INR reminders to    Comments Review of notes indicate this is believed to be a provoked, first known episode of VTE. Target duration of therapy found in notes indicates 6 months duration based upon these considerations.            ANTICOAG TODAY: Anticoagulation Summary as of 04/06/2012              INR goal 2.0-3.0     Selected INR 3.20! (04/06/2012) Next INR check 04/20/2012   Weekly max dose (mg)  Target end date 09/21/2012   Indications DVT (deep venous thrombosis) [453.40], Long term (current) use of anticoagulants [V58.61]    Anticoagulation Episode Summary              INR check location [facility] Preferred lab    Send INR reminders to    Comments Review of notes indicate this is believed to be a provoked, first known episode of VTE. Target duration of therapy found in notes indicates 6 months duration based upon these considerations.            PATIENT INSTRUCTIONS: Patient Instructions  Patient instructed to take medications as defined in the  Anti-coagulation Track section of this encounter.  Patient instructed to take today's dose.  Patient verbalized understanding of these instructions.        FOLLOW-UP Return in about 2 weeks (around 04/20/2012) for Follow up INR at 1415h.  Hulen Luster, III Pharm.D., CACP

## 2012-04-06 NOTE — Patient Instructions (Addendum)
Please return to clinic in one month for followup diabetes. Please bring your glucometer and blood sugar log to next visit.

## 2012-04-06 NOTE — Patient Instructions (Signed)
Patient instructed to take medications as defined in the Anti-coagulation Track section of this encounter.  Patient instructed to take today's dose.  Patient verbalized understanding of these instructions.    

## 2012-04-06 NOTE — Assessment & Plan Note (Signed)
Patient recently started on lisinopril 2.5 mg, which is also good for his diabetes. His blood pressure today is 138/83, apical  -Continue to monitor blood pressure and adjust medications as needed

## 2012-04-08 ENCOUNTER — Encounter: Payer: Self-pay | Admitting: Dietician

## 2012-04-08 NOTE — Progress Notes (Signed)
Patient ID: John [name], male   DOB: 1966/01/05, 47 y.o.   MRN: [id number] Physician referred patient for education of self monitoring of blood glucose. Patient left office without seeing CDE for same. Per nurse, patient verbalized lack of readiness for self monitoring.

## 2012-04-10 ENCOUNTER — Ambulatory Visit: Payer: Self-pay | Admitting: Internal Medicine

## 2012-04-20 ENCOUNTER — Ambulatory Visit (INDEPENDENT_AMBULATORY_CARE_PROVIDER_SITE_OTHER): Payer: Self-pay | Admitting: Pharmacist

## 2012-04-20 DIAGNOSIS — I82409 Acute embolism and thrombosis of unspecified deep veins of unspecified lower extremity: Secondary | ICD-10-CM

## 2012-04-20 DIAGNOSIS — Z7901 Long term (current) use of anticoagulants: Secondary | ICD-10-CM

## 2012-04-20 LAB — POCT INR: INR: 3.4

## 2012-04-20 MED ORDER — WARFARIN SODIUM 5 MG PO TABS: ORAL_TABLET | ORAL | Status: DC

## 2012-04-20 NOTE — Patient Instructions (Signed)
Patient instructed to take medications as defined in the Anti-coagulation Track section of this encounter.  Patient instructed to take today's dose.  Patient verbalized understanding of these instructions.    

## 2012-04-20 NOTE — Progress Notes (Signed)
Anti-Coagulation Progress Note  John [name] is a 47 y.o. male who is currently on an anti-coagulation regimen.    RECENT RESULTS: Recent results are below, the most recent result is correlated with a dose of 60 mg. per week: Lab Results  Component Value Date   INR 3.40 04/20/2012   INR 3.20 04/06/2012   INR 2.70 03/30/2012    ANTI-COAG DOSE:   Latest dosing instructions   Total Sun Mon Tue Wed Thu Fri Sat   57.5 7.5 mg 10 mg 7.5 mg 7.5 mg 10 mg 7.5 mg 7.5 mg    (5 mg1.5) (5 mg2) (5 mg1.5) (5 mg1.5) (5 mg2) (5 mg1.5) (5 mg1.5)         ANTICOAG SUMMARY: Anticoagulation Episode Summary              Current INR goal 2.0-3.0 Next INR check 05/11/2012   INR from last check 3.40! (04/20/2012)     Weekly max dose (mg)  Target end date 09/21/2012   Indications DVT (deep venous thrombosis) [453.40], Long term (current) use of anticoagulants [V58.61]   INR check location [facility] Preferred lab    Send INR reminders to    Comments Review of notes indicate this is believed to be a provoked, first known episode of VTE. Target duration of therapy found in notes indicates 6 months duration based upon these considerations.            ANTICOAG TODAY: Anticoagulation Summary as of 04/20/2012              INR goal 2.0-3.0     Selected INR 3.40! (04/20/2012) Next INR check 05/11/2012   Weekly max dose (mg)  Target end date 09/21/2012   Indications DVT (deep venous thrombosis) [453.40], Long term (current) use of anticoagulants [V58.61]    Anticoagulation Episode Summary              [facility]R check location [facility] Preferred lab    Send INR reminders to    Comments Review of notes indicate this is believed to be a provoked, first known episode of VTE. Target duration of therapy found in notes indicates 6 months duration based upon these considerations.            PATIENT INSTRUCTIONS: Patient Instructions  Patient instructed to take medications as defined in the  Anti-coagulation Track section of this encounter.  Patient instructed to take today's dose.  Patient verbalized understanding of these instructions.        FOLLOW-UP Return in 3 weeks (on 05/11/2012) for Follow up INR at 2:30PM.  Hulen Luster, III Pharm.D., CACP

## 2012-04-22 ENCOUNTER — Other Ambulatory Visit: Payer: Self-pay | Admitting: Internal Medicine

## 2012-04-22 ENCOUNTER — Encounter: Payer: Self-pay | Admitting: Internal Medicine

## 2012-04-30 ENCOUNTER — Ambulatory Visit: Payer: Self-pay | Admitting: Emergency Medicine

## 2012-04-30 VITALS — BP 130/89 | HR 91 | Temp 97.8°F | Resp 16 | Ht 70.0 in | Wt 189.0 lb

## 2012-04-30 DIAGNOSIS — H00019 Hordeolum externum unspecified eye, unspecified eyelid: Secondary | ICD-10-CM

## 2012-04-30 MED ORDER — OFLOXACIN 0.3 % OP SOLN: 2.0000 [drp] | OPHTHALMIC | Status: DC

## 2012-04-30 NOTE — Addendum Note (Signed)
Addended by: Bufford Spikes on: 04/30/2012 10:40 AM   Modules accepted: Orders

## 2012-04-30 NOTE — Patient Instructions (Addendum)
Sty  A sty (hordeolum) is an infection of a gland in the eyelid located at the base of the eyelash. A sty may develop a white or yellow head of pus. It can be puffy (swollen). Usually, the sty will burst and pus will come out on its own. They do not leave lumps in the eyelid once they drain.  A sty is often confused with another form of cyst of the eyelid called a chalazion. Chalazions occur within the eyelid and not on the edge where the bases of the eyelashes are. They often are red, sore and then form firm lumps in the eyelid.  CAUSES    Germs (bacteria).   Lasting (chronic) eyelid inflammation.  SYMPTOMS    Tenderness, redness and swelling along the edge of the eyelid at the base of the eyelashes.   Sometimes, there is a white or yellow head of pus. It may or may not drain.  DIAGNOSIS   An ophthalmologist will be able to distinguish between a sty and a chalazion and treat the condition appropriately.   TREATMENT    Styes are typically treated with warm packs (compresses) until drainage occurs.   In rare cases, medicines that kill germs (antibiotics) may be prescribed. These antibiotics may be in the form of drops, cream or pills.   If a hard lump has formed, it is generally necessary to do a small incision and remove the hardened contents of the cyst in a minor surgical procedure done in the office.   In suspicious cases, your caregiver may send the contents of the cyst to the lab to be certain that it is not a rare, but dangerous form of cancer of the glands of the eyelid.  HOME CARE INSTRUCTIONS    Wash your hands often and dry them with a clean towel. Avoid touching your eyelid. This may spread the infection to other parts of the eye.   Apply heat to your eyelid for 10 to 20 minutes, several times a day, to ease pain and help to heal it faster.   Do not squeeze the sty. Allow it to drain on its own. Wash your eyelid carefully 3 to 4 times per day to remove any pus.  SEEK IMMEDIATE MEDICAL CARE IF:     Your eye becomes painful or puffy (swollen).   Your vision changes.   Your sty does not drain by itself within 3 days.   Your sty comes back within a short period of time, even with treatment.   You have redness (inflammation) around the eye.   You have a fever.  Document Released: 12/19/2004 Document Revised: 06/03/2011 Document Reviewed: 08/23/2008  [employer] Patient Information 2013 [employer]

## 2012-04-30 NOTE — Progress Notes (Signed)
  Subjective:    Patient ID: John [name], male    DOB: 01-27-1966, 47 y.o.   MRN: [id number]  HPI Pt here today with a complaint of his left eye being swollen. He states he has had styes in the past and eye surgery during his childhood. He was just recently diagnosed with diabetes and is now under treatment.     Review of Systems     Objective:   Physical Exam is a one by one one half centimeter red swollen area left lower area. There is an obvious stye also present of the lower lid on the left. There is sedimentation rate performed and nonreactive. 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        Assessment & Plan:  Ocuflox one drop in the left 4 times a day. Doxycycline 100 twice a day.

## 2012-05-11 ENCOUNTER — Ambulatory Visit (INDEPENDENT_AMBULATORY_CARE_PROVIDER_SITE_OTHER): Payer: Self-pay | Admitting: Pharmacist

## 2012-05-11 DIAGNOSIS — I82409 Acute embolism and thrombosis of unspecified deep veins of unspecified lower extremity: Secondary | ICD-10-CM

## 2012-05-11 DIAGNOSIS — Z7901 Long term (current) use of anticoagulants: Secondary | ICD-10-CM

## 2012-05-11 LAB — POCT INR: INR: 1.5

## 2012-05-11 NOTE — Patient Instructions (Signed)
Patient instructed to take medications as defined in the Anti-coagulation Track section of this encounter.  Patient instructed to take today's dose.  Patient verbalized understanding of these instructions.    

## 2012-05-11 NOTE — Progress Notes (Signed)
Anti-Coagulation Progress Note  John [name] is a 47 y.o. male who is currently on an anti-coagulation regimen.    RECENT RESULTS: Recent results are below, the most recent result is correlated with a dose of 57.5 mg. per week: Lab Results  Component Value Date   INR 1.50 05/11/2012   INR 3.40 04/20/2012   INR 3.20 04/06/2012    ANTI-COAG DOSE: Anticoagulation Dose Instructions as of 05/11/2012     Glynis Smiles Tue Wed Thu Fri Sat   New Dose 7.5 mg 10 mg 7.5 mg 10 mg 7.5 mg 10 mg 7.5 mg       ANTICOAG SUMMARY: Anticoagulation Episode Summary   Current INR goal 2.0-3.0  Next INR check 06/01/2012  INR from last check 1.50! (05/11/2012)  Weekly max dose   Target end date 09/21/2012  [facility] location [facility]  Preferred lab   Send INR reminders to    Indications  DVT (deep venous thrombosis) [453.40] Long term (current) use of anticoagulants [V58.61]        Comments Review of notes indicate this is believed to be a provoked, first known episode of VTE. Target duration of therapy found in notes indicates 6 months duration based upon these considerations.        ANTICOAG TODAY: Anticoagulation Summary as of 05/11/2012   INR goal 2.0-3.0  Selected INR 1.50! (05/11/2012)  Next INR check 06/01/2012  Target end date 09/21/2012   Indications  DVT (deep venous thrombosis) [453.40] Long term (current) use of anticoagulants [V58.61]      Anticoagulation Episode Summary   [facility]   Preferred lab    Send INR reminders to    Comments Review of notes indicate this is believed to be a provoked, first known episode of VTE. Target duration of therapy found in notes indicates 6 months duration based upon these considerations.      PATIENT INSTRUCTIONS: Patient Instructions  Patient instructed to take medications as defined in the Anti-coagulation Track section of this encounter.  Patient instructed to take today's dose.  Patient verbalized understanding of  these instructions.       FOLLOW-UP Return in 3 weeks (on 06/01/2012) for Follow up INR at 3PM.  Hulen Luster, III Pharm.D., CACP

## 2012-05-25 ENCOUNTER — Encounter: Payer: Self-pay | Admitting: Internal Medicine

## 2012-06-01 ENCOUNTER — Ambulatory Visit (INDEPENDENT_AMBULATORY_CARE_PROVIDER_SITE_OTHER): Payer: Self-pay | Admitting: Pharmacist

## 2012-06-01 DIAGNOSIS — I82409 Acute embolism and thrombosis of unspecified deep veins of unspecified lower extremity: Secondary | ICD-10-CM

## 2012-06-01 DIAGNOSIS — Z7901 Long term (current) use of anticoagulants: Secondary | ICD-10-CM

## 2012-06-01 LAB — POCT INR: INR: 2.1

## 2012-06-01 NOTE — Patient Instructions (Signed)
Patient instructed to take medications as defined in the Anti-coagulation Track section of this encounter.  Patient instructed to take today's dose.  Patient verbalized understanding of these instructions.    

## 2012-06-01 NOTE — Progress Notes (Signed)
Anti-Coagulation Progress Note  John [name] is a 47 y.o. male who is currently on an anti-coagulation regimen.    RECENT RESULTS: Recent results are below, the most recent result is correlated with a dose of 60 mg. per week: Lab Results  Component Value Date   INR 2.10 06/01/2012   INR 1.50 05/11/2012   INR 3.40 04/20/2012    ANTI-COAG DOSE: Anticoagulation Dose Instructions as of 06/01/2012     Glynis Smiles Tue Wed Thu Fri Sat   New Dose 7.5 mg 10 mg 10 mg 10 mg 10 mg 10 mg 10 mg       ANTICOAG SUMMARY: Anticoagulation Episode Summary   Current INR goal 2.0-3.0  Next INR check 06/22/2012  INR from last check 2.10 (06/01/2012)  Weekly max dose   Target end date 09/21/2012  [facility] location [facility]  Preferred lab   Send INR reminders to    Indications  DVT (deep venous thrombosis) [453.40] Long term (current) use of anticoagulants [V58.61]        Comments Review of notes indicate this is believed to be a provoked, first known episode of VTE. Target duration of therapy found in notes indicates 6 months duration based upon these considerations.        ANTICOAG TODAY: Anticoagulation Summary as of 06/01/2012   INR goal 2.0-3.0  Selected INR 2.10 (06/01/2012)  Next INR check 06/22/2012  Target end date 09/21/2012   Indications  DVT (deep venous thrombosis) [453.40] Long term (current) use of anticoagulants [V58.61]      Anticoagulation Episode Summary   [facility]   Preferred lab    Send INR reminders to    Comments Review of notes indicate this is believed to be a provoked, first known episode of VTE. Target duration of therapy found in notes indicates 6 months duration based upon these considerations.      PATIENT INSTRUCTIONS: Patient Instructions  Patient instructed to take medications as defined in the Anti-coagulation Track section of this encounter.  Patient instructed to take today's dose.  Patient verbalized understanding of these  instructions.       FOLLOW-UP Return in 3 weeks (on 06/22/2012) for Follow up INR at 3PM.  Hulen Luster, III Pharm.D., CACP

## 2012-06-02 NOTE — Progress Notes (Signed)
Indication: Provoked DVT, 6 months of therapy, reassessed in late June 2014.  Dr. Saralyn Pilar assessment and plan were reviewed and I agree with the plan as documented in his note.

## 2012-06-22 ENCOUNTER — Ambulatory Visit: Payer: Self-pay

## 2012-06-25 ENCOUNTER — Encounter: Payer: Self-pay | Admitting: Internal Medicine

## 2012-06-25 ENCOUNTER — Ambulatory Visit (INDEPENDENT_AMBULATORY_CARE_PROVIDER_SITE_OTHER): Payer: Self-pay | Admitting: Internal Medicine

## 2012-06-25 VITALS — BP 127/83 | HR 85 | Temp 98.6°F | Ht 70.0 in | Wt 193.5 lb

## 2012-06-25 DIAGNOSIS — Z23 Encounter for immunization: Secondary | ICD-10-CM

## 2012-06-25 DIAGNOSIS — E119 Type 2 diabetes mellitus without complications: Secondary | ICD-10-CM

## 2012-06-25 DIAGNOSIS — Z0000 Encounter for general adult medical examination without abnormal findings: Secondary | ICD-10-CM

## 2012-06-25 DIAGNOSIS — Z79899 Other long term (current) drug therapy: Secondary | ICD-10-CM

## 2012-06-25 MED ORDER — GLIPIZIDE 5 MG PO TABS: ORAL_TABLET | ORAL | Status: DC

## 2012-06-25 MED ORDER — METFORMIN HCL 1000 MG PO TABS: ORAL_TABLET | ORAL | Status: DC

## 2012-06-25 NOTE — Assessment & Plan Note (Addendum)
Lab Results  Component Value Date   HGBA1C 6.5 06/25/2012   HGBA1C 14.1* 03/19/2012     Assessment: Diabetes control: good control (HgbA1C at goal) Progress toward A1C goal:  improved  Plan: Medications:  continue current medications Home glucose monitoring: Frequency: 2 times a day Timing: before breakfast;after dinner Instruction/counseling given: reminded to bring blood glucose meter & log to each visit Educational resources provided: brochure Self management tools provided: home glucose logbook  Patient A1c decreased from 14.1 to 6.5 in the last 4 months with lifestyle changes and oral medications. I congratulated the patient on this achievement and encouraged him to continue with the diet and lifestyle modifications.  Cont Metformin 1000mg  BID and glipizide 5mg  BID Refer to ophtho for annual dilated eye exam Patient to return to clinic in 3 months, reminded patient to bring glucometer. Continue lisinopril Review of signs and symptoms of hypoglycemia. Patient knows to check his blood sugar if he has symptoms and call our clinic with any questions.

## 2012-06-25 NOTE — Patient Instructions (Addendum)
General Instructions:  Return to clinic in 3 months  Please bring your blood sugar log and all your medications to each visit    Treatment Goals:  Goals (1 Years of Data) as of 06/25/12         As of Today 04/30/12 04/06/12 03/20/12 03/20/12     Blood Pressure    . Blood Pressure < 140/90  127/83 130/89 138/93 156/98 149/100     Result Component    . HEMOGLOBIN A1C < 7.0  6.5        . LDL CALC < 130    110        Progress Toward Treatment Goals:  Treatment Goal 06/25/2012  Hemoglobin A1C unable to assess  Blood pressure at goal    Self Care Goals & Plans:  Self Care Goal 06/25/2012  Manage my medications take my medicines as prescribed; bring my medications to every visit; refill my medications on time  Monitor my health keep track of my blood glucose; bring my glucose meter and log to each visit  Eat healthy foods drink diet soda or water instead of juice or soda; eat more vegetables; eat foods that are low in salt; eat baked foods instead of fried foods  Be physically active find an activity I enjoy    Home Blood Glucose Monitoring 06/25/2012  Check my blood sugar 2 times a day  When to check my blood sugar before breakfast; after dinner     Care Management & Community Referrals:

## 2012-06-25 NOTE — Progress Notes (Signed)
Internal Medicine Clinic Visit    HPI:  John [name] is a 47 y.o. year old male with a recent history of DVT on Coumadin, and poorly controlled type 2 diabetes, who presents for followup.   Last A1c was 14 in December. He refused insulin therapy at that time and has been on metformin and glipizide. He checks his blood sugar twice daily, before breakfast and in the afternoon after lunch. He states his morning sugars are about 200. He has maintained less all changes such as cutting out fast food and high sugar foods. He denies symptoms of urinary frequency, polydipsia, excessive hunger. He denies any changes in his vision. He did not remember to bring his meter to clinic today as he came directly from work.  He states that he has been having trouble with the lancets piercing his skin. He often cannot get enough blood for the meter to read. He has not tried using warm water or massage techniques.  Works as a Financial risk analyst. Nonsmoker, never smoker, no etoh, no other drugs. Lives with girlfriend.   Past Medical History  Diagnosis Date  . Asthma   . Diabetes mellitus without complication   . Hypertension     Past Surgical History  Procedure Laterality Date  . Eye surgery       ROS:  A complete review of systems was otherwise negative, except as noted in the HPI.  Allergies: Review of patient's allergies indicates no known allergies.  Medications: Current Outpatient Prescriptions  Medication Sig Dispense Refill  . glipiZIDE (GLUCOTROL) 5 MG tablet Take 1 tablet (5 mg total) by mouth 2 (two) times daily. Take 30 min before a meal.  60 tablet  6  . lisinopril (PRINIVIL,ZESTRIL) 2.5 MG tablet Take 1 tablet (2.5 mg total) by mouth daily.  30 tablet  3  . metFORMIN (GLUCOPHAGE) 500 MG tablet Take 1 tab by mouth twice a day for 1 week with meals and then increase it to two pills twice a day from day 8 till your next visit.  90 tablet  3  . ofloxacin (OCUFLOX) 0.3 % ophthalmic solution Place 2 drops  into the left eye every 4 (four) hours.  5 mL  0  . warfarin (COUMADIN) 5 MG tablet Take 2 tablets on Monday/Thursday; Take 1 and 1/2 tables on all other days. Take as directed by anticoagulation clinic provider.  60 tablet  2  . warfarin (COUMADIN) 5 MG tablet TAKE 1 AND 1/2 TABLETS BY MOUTHJ FOR 3 DAYS THEN TAKE THE REST AS DIRECTED  30 tablet  0   No current facility-administered medications for this visit.    History   Social History  . Marital Status: Single    Spouse Name: N/A    Number of Children: N/A  . Years of Education: N/A   Occupational History  . Not on file.   Social History Main Topics  . Smoking status: Never Smoker   . Smokeless tobacco: Not on file  . Alcohol Use: No  . Drug Use: No  . Sexually Active: Not on file   Other Topics Concern  . Not on file   Social History Narrative  . No narrative on file    family history includes Heart disease in his father and mother.  Physical Exam Blood pressure 127/83, pulse 85, temperature 98.6 F (37 C), temperature source Oral, height 5\' 10"  (1.778 m), weight 193 lb 8 oz (87.771 kg), SpO2 97.00%. General:  No acute distress, alert and oriented x  3, well-appearing AAM HEENT:  PERRL, EOMI, no lymphadenopathy, moist mucous membranes Cardiovascular:  Regular rate and rhythm, no murmurs, rubs or gallops Respiratory:  Clear to auscultation bilaterally, no wheezes, rales, or rhonchi Abdomen:  Soft, nondistended, nontender, normoactive bowel sounds Extremities:  Warm and well-perfused, no clubbing, cyanosis, or edema. Calves nontender to palpation. Skin: Warm, dry, no rashes Neuro: Not anxious appearing, no depressed mood, normal affect  Labs: Lab Results  Component Value Date   CREATININE 0.78 03/19/2012   BUN 10 03/19/2012   NA 139 03/19/2012   K 4.0 03/19/2012   CL 102 03/19/2012   CO2 25 03/19/2012   Lab Results  Component Value Date   WBC 8.7 03/19/2012   HGB 14.7 03/19/2012   HCT 41.4 03/19/2012    MCV 87.0 03/19/2012   PLT 164 03/19/2012      Assessment and Plan:    FOLLOWUP: John [name] will follow back up in our clinic in approximately one month. John [name] knows to call our clinic in the meantime with any questions or new issues.

## 2012-06-25 NOTE — Progress Notes (Signed)
I have reviewed the note by Dr. Alexandria Lodge.  Agree with documented plan.

## 2012-07-01 DIAGNOSIS — Z0000 Encounter for general adult medical examination without abnormal findings: Secondary | ICD-10-CM | POA: Insufficient documentation

## 2012-07-01 NOTE — Assessment & Plan Note (Signed)
Patient referred for ophthalmology exam Tdap given at this visit Patient will need colonoscopy as he is over 40 and African American, address at next visit.

## 2012-07-13 ENCOUNTER — Other Ambulatory Visit: Payer: Self-pay | Admitting: Internal Medicine

## 2012-07-13 DIAGNOSIS — E119 Type 2 diabetes mellitus without complications: Secondary | ICD-10-CM

## 2012-07-13 MED ORDER — METFORMIN HCL 1000 MG PO TABS: ORAL_TABLET | ORAL | Status: DC

## 2012-07-26 ENCOUNTER — Other Ambulatory Visit: Payer: Self-pay | Admitting: Internal Medicine

## 2012-08-03 ENCOUNTER — Encounter: Payer: Self-pay | Admitting: Internal Medicine

## 2012-08-24 ENCOUNTER — Ambulatory Visit: Payer: Self-pay

## 2012-09-01 ENCOUNTER — Encounter: Payer: Self-pay | Admitting: Dietician

## 2012-09-07 NOTE — Addendum Note (Signed)
Addended by: Bufford Spikes on: 09/07/2012 03:39 PM   Modules accepted: Orders

## 2012-10-01 ENCOUNTER — Other Ambulatory Visit: Payer: Self-pay

## 2012-12-07 ENCOUNTER — Ambulatory Visit: Payer: Self-pay

## 2013-11-09 ENCOUNTER — Telehealth: Payer: Self-pay | Admitting: Dietician

## 2013-11-09 NOTE — Telephone Encounter (Signed)
phone not accepting calls

## 2014-03-11 ENCOUNTER — Ambulatory Visit: Payer: Self-pay | Admitting: Internal Medicine

## 2015-10-16 ENCOUNTER — Telehealth: Payer: Self-pay | Admitting: Behavioral Health

## 2015-10-16 NOTE — Telephone Encounter (Signed)
Unable to reach patient at time of Pre-Visit Call.  Left message for patient to return call when available.    

## 2015-10-18 ENCOUNTER — Ambulatory Visit (INDEPENDENT_AMBULATORY_CARE_PROVIDER_SITE_OTHER): Payer: BLUE CROSS/BLUE SHIELD | Admitting: Physician Assistant

## 2015-10-18 ENCOUNTER — Encounter: Payer: Self-pay | Admitting: Physician Assistant

## 2015-10-18 VITALS — BP 168/96 | HR 81 | Temp 98.3°F | Resp 16 | Ht 70.0 in | Wt 176.5 lb

## 2015-10-18 DIAGNOSIS — Z23 Encounter for immunization: Secondary | ICD-10-CM | POA: Diagnosis not present

## 2015-10-18 DIAGNOSIS — E119 Type 2 diabetes mellitus without complications: Secondary | ICD-10-CM | POA: Diagnosis not present

## 2015-10-18 DIAGNOSIS — I10 Essential (primary) hypertension: Secondary | ICD-10-CM | POA: Diagnosis not present

## 2015-10-18 DIAGNOSIS — Z86718 Personal history of other venous thrombosis and embolism: Secondary | ICD-10-CM | POA: Diagnosis not present

## 2015-10-18 DIAGNOSIS — R6882 Decreased libido: Secondary | ICD-10-CM | POA: Insufficient documentation

## 2015-10-18 LAB — HEMOGLOBIN A1C: Hgb A1c MFr Bld: 13.1 % — ABNORMAL HIGH (ref 4.6–6.5)

## 2015-10-18 LAB — MICROALBUMIN / CREATININE URINE RATIO
Creatinine,U: 88.5 mg/dL
MICROALB UR: 6 mg/dL — AB (ref 0.0–1.9)
Microalb Creat Ratio: 6.8 mg/g (ref 0.0–30.0)

## 2015-10-18 LAB — COMPREHENSIVE METABOLIC PANEL
ALK PHOS: 87 U/L (ref 39–117)
ALT: 19 U/L (ref 0–53)
AST: 16 U/L (ref 0–37)
Albumin: 4.3 g/dL (ref 3.5–5.2)
BILIRUBIN TOTAL: 0.9 mg/dL (ref 0.2–1.2)
BUN: 14 mg/dL (ref 6–23)
CALCIUM: 9.6 mg/dL (ref 8.4–10.5)
CO2: 29 mEq/L (ref 19–32)
CREATININE: 1.1 mg/dL (ref 0.40–1.50)
Chloride: 103 mEq/L (ref 96–112)
GFR: 91.3 mL/min (ref >60.00)
GLUCOSE: 328 mg/dL — AB (ref 70–99)
Potassium: 4 mEq/L (ref 3.5–5.1)
Sodium: 137 mEq/L (ref 135–145)
TOTAL PROTEIN: 8.2 g/dL (ref 6.0–8.3)

## 2015-10-18 LAB — URINALYSIS, ROUTINE W REFLEX MICROSCOPIC
Bilirubin Urine: NEGATIVE
Hgb urine dipstick: NEGATIVE
KETONES UR: NEGATIVE
LEUKOCYTES UA: NEGATIVE
Nitrite: NEGATIVE
PH: 5.5 (ref 5.0–8.0)
RBC / HPF: NONE SEEN (ref 0–?)
SPECIFIC GRAVITY, URINE: 1.015 (ref 1.000–1.030)
Total Protein, Urine: NEGATIVE
UROBILINOGEN UA: 0.2 (ref 0.0–1.0)

## 2015-10-18 LAB — LIPID PANEL
CHOL/HDL RATIO: 4
Cholesterol: 180 mg/dL (ref 0–200)
HDL: 43.3 mg/dL (ref >39.00)
LDL Cholesterol: 123 mg/dL — ABNORMAL HIGH (ref 0–99)
NonHDL: 136.3
TRIGLYCERIDES: 68 mg/dL (ref 0.0–149.0)
VLDL: 13.6 mg/dL (ref 0.0–40.0)

## 2015-10-18 MED ORDER — METFORMIN HCL 500 MG PO TABS: ORAL_TABLET | ORAL | 3 refills | Status: DC

## 2015-10-18 MED ORDER — LISINOPRIL 5 MG PO TABS: 5.0000 mg | ORAL_TABLET | Freq: Every day | ORAL | 3 refills | Status: DC

## 2015-10-18 MED ORDER — BLOOD GLUCOSE MONITOR KIT: PACK | 0 refills | Status: DC

## 2015-10-18 NOTE — Assessment & Plan Note (Signed)
Foot examination updated today. No abnormal sensation noted. Mild onychomycosis noted. Home remedies reviewed. Nail care discussed. Is overdue for eye examination. Denies history of retinopathy. Denies vision changes. Referral to Ophthalmology placed (Dr. Elmer Picker). Will check labs today to include BMP, A1C, UA, Urine microalbumin and Lipid profile. Will restart medication -- Metformin 500 mg once daily x 1 week. Increase to 1 tablet PO BID. Glucometer ordered. Sugar journal given to patient. He is to check fasting sugar daily and record. Bring to follow-up. Diet discussed. Meal planning guide given.   FU 1 month.

## 2015-10-18 NOTE — Assessment & Plan Note (Signed)
With diabetes. BP continues to be elevated on recheck. Asymptomatic. Will check labs today. Will start lisinopril 5 mg. DASH diet encouraged. FU 1 month for reassessment.

## 2015-10-18 NOTE — Assessment & Plan Note (Signed)
With some intermittent ED. Discussed diabetes and HTN as contributor. Will refer to urology per patient request.

## 2015-10-18 NOTE — Progress Notes (Signed)
Patient presents to clinic today to establish care.  Acute Concerns: Patient endorses decreased libido over the past couple of years. Endorses intermittent ED. Denies prior assessment or treatment. Is requesting referral to Urologist for evaluation.  Chronic Issues: Hypertension -- Endorses prior history, previously on Lisinopril. Has been out of medication for 2 years. Denies history of known CAD, MI or CVA. Patient denies chest pain, palpitations, lightheadedness, dizziness, vision changes or frequent headaches.  BP Readings from Last 3 Encounters:  10/18/15 (!) 168/96  06/25/12 127/83  04/30/12 130/89   Diabetes Mellitus II -- Uncontrolled. Previously on combination of Glucotrol and Metformin. Has not had medications in over 2 years. Has been eliminating carbs from diet. Body mass index is 25.33 kg/m. Endorses intermittent polyuria. Denies history of retinopathy. Needs referral to Ophthalmology. Denies history of kidney disease 2/2 DM. Endorses some intermittent tingling of feet bilaterally but without pain or numbness. Denies history of foot ulcers/sores.   Health Maintenance: Immunizations -- has never had Pneumovax. Agrees to have today.   Past Medical History:  Diagnosis Date  . Asthma   . Diabetes mellitus without complication (HCC)   . History of chicken pox   . History of DVT (deep vein thrombosis)    s/p trauma of R leg -- 2014. No other history of DVT  . History of kidney stones   . Hypertension   . Lazy eye of left side     Past Surgical History:  Procedure Laterality Date  . EYE SURGERY      No current outpatient prescriptions on file prior to visit.   No current facility-administered medications on file prior to visit.     No Known Allergies  Family History  Problem Relation Age of Onset  . Heart disease Mother 2  . Arthritis Mother   . Heart disease Father 22  . Stroke Father   . Healthy Brother   . Heart disease Maternal Grandmother   .  Diabetes Maternal Uncle     Social History   Social History  . Marital status: Single    Spouse name: N/A  . Number of children: N/A  . Years of education: N/A   Occupational History  . Cook     [employer]    Social History Main Topics  . Smoking status: Never Smoker  . Smokeless tobacco: Never Used  . Alcohol use No  . Drug use: No  . Sexual activity: Not Currently    Partners: Female   Other Topics Concern  . Not on file   Social History Narrative  . No narrative on file   Review of Systems  Constitutional: Negative for fever and weight loss.  HENT: Negative for ear discharge, ear pain, hearing loss and tinnitus.   Eyes: Negative for blurred vision, double vision, photophobia and pain.  Respiratory: Negative for cough and shortness of breath.   Cardiovascular: Negative for chest pain and palpitations.  Gastrointestinal: Negative for abdominal pain, blood in stool, constipation, diarrhea, heartburn, melena, nausea and vomiting.  Genitourinary: Positive for frequency. Negative for dysuria, flank pain, hematuria and urgency.       + decreased libido and ED.  Musculoskeletal: Negative for falls.  Neurological: Negative for dizziness, loss of consciousness and headaches.  Endo/Heme/Allergies: Negative for environmental allergies and polydipsia.  Psychiatric/Behavioral: Negative for depression, hallucinations, substance abuse and suicidal ideas. The patient is not nervous/anxious and does not have insomnia.    BP (!) 168/96 (BP Location: Left Arm, Cuff Size:  Large)   Pulse 81   Temp 98.3 F (36.8 C) (Oral)   Resp 16   Ht  (1.778 m)   Wt 176 lb 8 oz (80.1 kg)   SpO2 98%   BMI 25.33 kg/m   Physical Exam  Constitutional: He is oriented to person, place, and time and well-developed, well-nourished, and in no distress.  HENT:  Head: Normocephalic and atraumatic.  Right Ear: External ear normal.  Left Ear: External ear normal.  Nose: Nose normal.    Mouth/Throat: Oropharynx is clear and moist. No oropharyngeal exudate.  TM within normal limits bilaterally.  Eyes: Conjunctivae are normal. Pupils are equal, round, and reactive to light.  Neck: Neck supple.  Cardiovascular: Normal rate, regular rhythm, normal heart sounds and intact distal pulses.   Pulmonary/Chest: Effort normal and breath sounds normal. No respiratory distress. He has no wheezes. He has no rales. He exhibits no tenderness.  Lymphadenopathy:    He has no cervical adenopathy.  Neurological: He is alert and oriented to person, place, and time.  Skin: Skin is warm and dry. No rash noted.  Psychiatric: Affect normal.  Vitals reviewed.  Diabetic Foot Form - Detailed   Diabetic Foot Exam - detailed Diabetic Foot exam was performed with the following findings:  Yes 10/18/2015  9:39 AM  Visual Foot Exam completed.:  Yes  Is there a history of foot ulcer?:  No Can the patient see the bottom of their feet?:  Yes Are the shoes appropriate in style and fit?:  Yes Is there swelling or and abnormal foot shape?:  No Are the toenails long?:  Yes Are the toenails thick?:  Yes Do you have pain in calf while walking?:  No Is there a claw toe deformity?:  No Is there elevated skin temparature?:  No Is there limited skin dorsiflexion?:  No Is there foot or ankle muscle weakness?:  No Are the toenails ingrown?:  No Normal Range of Motion:  Yes Pulse Foot Exam completed.:  Yes  Right posterior Tibialias:  Present Left posterior Tibialias:  Present  Right Dorsalis Pedis:  Present Left Dorsalis Pedis:  Present  Sensory Foot Exam Completed.:  Yes Swelling:  No Semmes-Weinstein Monofilament Test   Comments:  Monofilament test performed within normal limits.     Assessment/Plan: Hypertension With diabetes. BP continues to be elevated on recheck. Asymptomatic. Will check labs today. Will start lisinopril 5 mg. DASH diet encouraged. FU 1 month for reassessment.  DM type 2  (diabetes mellitus, type 2) Foot examination updated today. No abnormal sensation noted. Mild onychomycosis noted. Home remedies reviewed. Nail care discussed. Is overdue for eye examination. Denies history of retinopathy. Denies vision changes. Referral to Ophthalmology placed (Dr. Elmer Picker). Will check labs today to include BMP, A1C, UA, Urine microalbumin and Lipid profile. Will restart medication -- Metformin 500 mg once daily x 1 week. Increase to 1 tablet PO BID. Glucometer ordered. Sugar journal given to patient. He is to check fasting sugar daily and record. Bring to follow-up. Diet discussed. Meal planning guide given.   FU 1 month.  Decreased libido With some intermittent ED. Discussed diabetes and HTN as contributor. Will refer to urology per patient request.    Piedad Climes, PA-C

## 2015-10-18 NOTE — Progress Notes (Signed)
Pre visit review using our clinic review tool, if applicable. No additional management support is needed unless otherwise documented below in the visit note/SLS  

## 2015-10-18 NOTE — Patient Instructions (Signed)
Please go to the lab for blood work. I will call you with your results. Please start the lisinopril daily as directed. We will restart metformin at 500 mg once daily for 1 week. Then increase to 1 tablet twice daily.   I have sent in a prescription for a [facility] Contour glucose testing kit with additional supplies. Please check your blood sugar each morning before breakfast. Write this down in the journal I have given you.  Bring to your follow-up in 4 weeks.  We will likely have to add on other medication to get sugar under control. Our eventual goal is a fasting sugar between 80-120.   Follow the dietary recommendations I have given you.   Follow-up with me in 1 month.  You will be contacted for a diabetic eye examination and an appointment with Urology.   Diabetes and Exercise Exercising regularly is important. It is not just about losing weight. It has many health benefits, such as:  Improving your overall fitness, flexibility, and endurance.  Increasing your bone density.  Helping with weight control.  Decreasing your body fat.  Increasing your muscle strength.  Reducing stress and tension.  Improving your overall health. People with diabetes who exercise gain additional benefits because exercise:  Reduces appetite.  Improves the body's use of blood sugar (glucose).  Helps lower or control blood glucose.  Decreases blood pressure.  Helps control blood lipids (such as cholesterol and triglycerides).  Improves the body's use of the hormone insulin by:  Increasing the body's insulin sensitivity.  Reducing the body's insulin needs.  Decreases the risk for heart disease because exercising:  Lowers cholesterol and triglycerides levels.  Increases the levels of good cholesterol (such as high-density lipoproteins [HDL]) in the body.  Lowers blood glucose levels. YOUR ACTIVITY PLAN  Choose an activity that you enjoy, and set realistic goals. To exercise safely,  you should begin practicing any new physical activity slowly, and gradually increase the intensity of the exercise over time. Your health care provider or diabetes educator can help create an activity plan that works for you. General recommendations include:  Encouraging children to engage in at least 60 minutes of physical activity each day.  Stretching and performing strength training exercises, such as yoga or weight lifting, at least 2 times per week.  Performing a total of at least 150 minutes of moderate-intensity exercise each week, such as brisk walking or water aerobics.  Exercising at least 3 days per week, making sure you allow no more than 2 consecutive days to pass without exercising.  Avoiding long periods of inactivity (90 minutes or more). When you have to spend an extended period of time sitting down, take frequent breaks to walk or stretch. RECOMMENDATIONS FOR EXERCISING WITH TYPE 1 OR TYPE 2 DIABETES   Check your blood glucose before exercising. If blood glucose levels are greater than 240 mg/dL, check for urine ketones. Do not exercise if ketones are present.  Avoid injecting insulin into areas of the body that are going to be exercised. For example, avoid injecting insulin into:  The arms when playing tennis.  The legs when jogging.  Keep a record of:  Food intake before and after you exercise.  Expected peak times of insulin action.  Blood glucose levels before and after you exercise.  The type and amount of exercise you have done.  Review your records with your health care provider. Your health care provider will help you to develop guidelines for adjusting food intake and  insulin amounts before and after exercising.  If you take insulin or oral hypoglycemic agents, watch for signs and symptoms of hypoglycemia. They include:  Dizziness.  Shaking.  Sweating.  Chills.  Confusion.  Drink plenty of water while you exercise to prevent dehydration or heat  stroke. Body water is lost during exercise and must be replaced.  Talk to your health care provider before starting an exercise program to make sure it is safe for you. Remember, almost any type of activity is better than none.   This information is not intended to replace advice given to you by your health care provider. Make sure you discuss any questions you have with your health care provider.   Document Released: 06/01/2003 Document Revised: 07/26/2014 Document Reviewed: 08/18/2012 Elsevier Interactive Patient Education [employer].

## 2015-10-20 ENCOUNTER — Telehealth: Payer: Self-pay | Admitting: Physician Assistant

## 2015-10-20 NOTE — Telephone Encounter (Signed)
Relation to VC:[facility] Call back number:[phone] Pharmacy:  Reason for call:  Patient inquiring about lab results

## 2015-10-20 NOTE — Telephone Encounter (Signed)
Patient informed, understood & agreed/sls 07/28 SEE Result note.

## 2015-10-27 LAB — HM DIABETES EYE EXAM

## 2015-10-31 ENCOUNTER — Encounter: Payer: Self-pay | Admitting: Physician Assistant

## 2015-10-31 ENCOUNTER — Ambulatory Visit (INDEPENDENT_AMBULATORY_CARE_PROVIDER_SITE_OTHER): Payer: BLUE CROSS/BLUE SHIELD | Admitting: Physician Assistant

## 2015-10-31 VITALS — BP 132/88 | HR 91 | Temp 98.5°F | Resp 16 | Ht 70.0 in | Wt 175.5 lb

## 2015-10-31 DIAGNOSIS — R350 Frequency of micturition: Secondary | ICD-10-CM

## 2015-10-31 DIAGNOSIS — I10 Essential (primary) hypertension: Secondary | ICD-10-CM

## 2015-10-31 DIAGNOSIS — R399 Unspecified symptoms and signs involving the genitourinary system: Secondary | ICD-10-CM | POA: Diagnosis not present

## 2015-10-31 DIAGNOSIS — E119 Type 2 diabetes mellitus without complications: Secondary | ICD-10-CM

## 2015-10-31 LAB — POCT URINALYSIS DIPSTICK
Bilirubin, UA: NEGATIVE
GLUCOSE UA: NEGATIVE
Ketones, UA: NEGATIVE
Leukocytes, UA: NEGATIVE
NITRITE UA: NEGATIVE
PROTEIN UA: POSITIVE
RBC UA: NEGATIVE
Spec Grav, UA: >=1.03
UROBILINOGEN UA: 0.2
pH, UA: 6

## 2015-10-31 NOTE — Patient Instructions (Signed)
Please go to the lab for blood work. I will call you with your results.  We are resending in the glucometer. Check your fasting sugars daily and write down.  If kidney function looks good we will alter your medication regimen.  Please stay well hydrated. Your urine test is negative. We are sending for culture. If positive we will treat for UTI.

## 2015-10-31 NOTE — Progress Notes (Signed)
Patient presents to clinic today for follow-up of diabetes mellitus, type II, uncontrolled. A1C obtained 10/18/15 revealed A1C at 13.1. Patient previously a diabetic and had been off of medication for quite some time before establishing here. Patient was started on metformin 500 mg once daily 1 week, then increase to 500 mg twice a day. Patient endorses taking medications as directed without side effect. Is working on diet and exercise. Is taking lisinopril 5 mg daily as directed. Patient denies chest pain, palpitations, lightheadedness, dizziness, vision changes or frequent headaches. Patient does note some urinary frequency. Denies dysuria, hematuria, nausea, vomiting, fever or chills. At last visit, prescription for [facility] meter and testing supplies was sent to pharmacy. Patient states he has not picked this up yet. As such, has not been checking fasting sugars as directed.  BP Readings from Last 3 Encounters:  10/31/15 132/88  10/18/15 (!) 168/96  06/25/12 127/83   Lab Results  Component Value Date   HGBA1C 13.1 (H) 10/18/2015   Past Medical History:  Diagnosis Date  . Asthma   . Diabetes mellitus without complication ([place])   . History of chicken pox   . History of DVT (deep vein thrombosis)    s/p trauma of R leg -- 2014. No other history of DVT  . History of kidney stones   . Hypertension   . Lazy eye of left side     Current Outpatient Prescriptions on File Prior to Visit  Medication Sig Dispense Refill  . lisinopril (PRINIVIL,ZESTRIL) 5 MG tablet Take 1 tablet (5 mg total) by mouth daily. 30 tablet 3  . metFORMIN (GLUCOPHAGE) 500 MG tablet Take 1 tablet by mouth daily x 1 week. Then increase to 1 tablet BID. (Patient taking differently: Take 500 mg by mouth 2 (two) times daily with a meal. Take 1 tablet by mouth daily x 1 week. Then increase to 1 tablet BID.) 60 tablet 3  . Multiple Vitamins-Minerals (MENS MULTIVITAMIN PLUS) TABS Take by mouth daily.    . blood glucose  meter kit and supplies KIT Dispense based on patient and insurance preference. Use up to four times daily as directed. (FOR ICD-9 250.00, 250.01). 1 each 0   No current facility-administered medications on file prior to visit.     No Known Allergies  Family History  Problem Relation Age of Onset  . Heart disease Mother 39  . Arthritis Mother   . Heart disease Father 59  . Stroke Father   . Healthy Brother   . Heart disease Maternal Grandmother   . Diabetes Maternal Uncle     Social History   Social History  . Marital status: Single    Spouse name: N/A  . Number of children: N/A  . Years of education: N/A   Occupational History  . [employer]    Social History Main Topics  . Smoking status: Never Smoker  . Smokeless tobacco: Never Used  . Alcohol use No  . Drug use: No  . Sexual activity: Not Currently    Partners: Female   Other Topics Concern  . None   Social History Narrative  . None    Review of Systems - See HPI.  All other ROS are negative.  BP 132/88 (BP Location: Left Arm, Patient Position: Sitting, Cuff Size: Large)   Pulse 91   Temp 98.5 F (36.9 C) (Oral)   Resp 16   Ht 5' 10"  (1.778 m)   Wt 175  lb 8 oz (79.6 kg)   SpO2 98%   BMI 25.18 kg/m   Physical Exam  Constitutional: He is oriented to person, place, and time and well-developed, well-nourished, and in no distress.  HENT:  Head: Normocephalic and atraumatic.  Eyes: Conjunctivae are normal.  Neck: Neck supple.  Cardiovascular: Normal rate, regular rhythm, normal heart sounds and intact distal pulses.   Pulmonary/Chest: Effort normal and breath sounds normal. No respiratory distress. He has no wheezes. He has no rales. He exhibits no tenderness.  Abdominal: Soft. Bowel sounds are normal. There is no tenderness.  Neurological: He is alert and oriented to person, place, and time.  Skin: Skin is warm and dry. No rash noted.  Psychiatric: Affect normal.  Vitals  reviewed.   Recent Results (from the past 2160 hour(s))  Comprehensive metabolic panel     Status: Abnormal   Collection Time: 10/18/15  9:28 AM  Result Value Ref Range   Sodium 137 135 - 145 mEq/L   Potassium 4.0 3.5 - 5.1 mEq/L   Chloride 103 96 - 112 mEq/L   CO2 29 19 - 32 mEq/L   Glucose, Bld 328 (H) 70 - 99 mg/dL   BUN 14 6 - 23 mg/dL   Creatinine, Ser 1.10 0.40 - 1.50 mg/dL   Total Bilirubin 0.9 0.2 - 1.2 mg/dL   Alkaline Phosphatase 87 39 - 117 U/L   AST 16 0 - 37 U/L   ALT 19 0 - 53 U/L   Total Protein 8.2 6.0 - 8.3 g/dL   Albumin 4.3 3.5 - 5.2 g/dL   Calcium 9.6 8.4 - 10.5 mg/dL   GFR 91.30 >60.00 mL/min  Hemoglobin A1c     Status: Abnormal   Collection Time: 10/18/15  9:28 AM  Result Value Ref Range   Hgb A1c MFr Bld 13.1 (H) 4.6 - 6.5 %    Comment: Glycemic Control Guidelines for People with Diabetes:Non Diabetic:  <6%Goal of Therapy: <7%Additional Action Suggested:  >8%   Lipid panel     Status: Abnormal   Collection Time: 10/18/15  9:28 AM  Result Value Ref Range   Cholesterol 180 0 - 200 mg/dL    Comment: ATP III Classification       Desirable:  < 200 mg/dL               Borderline High:  200 - 239 mg/dL          High:  > = 240 mg/dL   Triglycerides 68.0 0.0 - 149.0 mg/dL    Comment: Normal:  <150 mg/dLBorderline High:  150 - 199 mg/dL   HDL 43.30 >39.00 mg/dL   VLDL 13.6 0.0 - 40.0 mg/dL   LDL Cholesterol 123 (H) 0 - 99 mg/dL   Total CHOL/HDL Ratio 4     Comment:                Men          Women1/2 Average Risk     3.4          3.3Average Risk          5.0          4.42X Average Risk          9.6          7.13X Average Risk          15.0          11.0  NonHDL 136.30     Comment: NOTE:  Non-HDL goal should be 30 mg/dL higher than patient's LDL goal (i.e. LDL goal of < 70 mg/dL, would have non-HDL goal of < 100 mg/dL)  Urinalysis, Routine w reflex microscopic (not at [facility])     Status: Abnormal   Collection Time: 10/18/15  9:28 AM  Result  Value Ref Range   Color, Urine YELLOW Yellow;Lt. Yellow   APPearance CLEAR Clear   Specific Gravity, Urine 1.015 1.000 - 1.030   pH 5.5 5.0 - 8.0   Total Protein, Urine NEGATIVE Negative   Urine Glucose >=1000 (A) Negative   Ketones, ur NEGATIVE Negative   Bilirubin Urine NEGATIVE Negative   Hgb urine dipstick NEGATIVE Negative   Urobilinogen, UA 0.2 0.0 - 1.0   Leukocytes, UA NEGATIVE Negative   Nitrite NEGATIVE Negative   WBC, UA 0-2/hpf 0-2/hpf   RBC / HPF none seen 0-2/hpf   Squamous Epithelial / LPF Rare(0-4/hpf) Rare(0-4/hpf)  Urine Microalbumin w/creat. ratio     Status: Abnormal   Collection Time: 10/18/15  9:28 AM  Result Value Ref Range   Microalb, Ur 6.0 (H) 0.0 - 1.9 mg/dL   Creatinine,U 88.5 mg/dL   Microalb Creat Ratio 6.8 0.0 - 30.0 mg/g  HM DIABETES EYE EXAM     Status: None   Collection Time: 10/27/15 12:00 AM  Result Value Ref Range   HM Diabetic Eye Exam No Retinopathy No Retinopathy    Comment: [facility] -- Dr. Marshall Cork  Basic metabolic panel     Status: Abnormal   Collection Time: 10/31/15  4:38 PM  Result Value Ref Range   Sodium 137 135 - 145 mEq/L   Potassium 4.1 3.5 - 5.1 mEq/L   Chloride 100 96 - 112 mEq/L   CO2 32 19 - 32 mEq/L   Glucose, Bld 183 (H) 70 - 99 mg/dL   BUN 14 6 - 23 mg/dL   Creatinine, Ser 0.82 0.40 - 1.50 mg/dL   Calcium 10.4 8.4 - 10.5 mg/dL   GFR 128.12 >60.00 mL/min  POCT urinalysis dipstick     Status: None   Collection Time: 10/31/15  5:16 PM  Result Value Ref Range   Color, UA golden    Clarity, UA clear    Glucose, UA neg    Bilirubin, UA neg    Ketones, UA neg    Spec Grav, UA >=1.030    Blood, UA neg    pH, UA 6.0    Protein, UA positive 1+    Urobilinogen, UA 0.2    Nitrite, UA neg    Leukocytes, UA Negative Negative   Assessment/Plan: DM type 2 (diabetes mellitus, type 2) We'll obtain BMP today to reassess renal function. This also give me an idea of how much sugars have changed. 2 early  to repeat A1c. If renal function stable, we will continue titrating metformin up to 2000 mg max daily. Diet and exercise reviewed with patient. He will pick up glucose meter and begin checking fasting sugars daily. The current goal for now his fasting sugar between 80 and 130. FU scheduled.  Hypertension Much improved. Asymptomatic. Continue lisinopril 5 mg daily.  Urinary frequency Urine dip without sign of infection. Will send for culture. Supportive measures reviewed. Will treat based on culture results.    Leeanne Rio, PA-C

## 2015-11-01 LAB — BASIC METABOLIC PANEL
BUN: 14 mg/dL (ref 6–23)
CHLORIDE: 100 meq/L (ref 96–112)
CO2: 32 meq/L (ref 19–32)
CREATININE: 0.82 mg/dL (ref 0.40–1.50)
Calcium: 10.4 mg/dL (ref 8.4–10.5)
GFR: 128.12 mL/min (ref >60.00)
GLUCOSE: 183 mg/dL — AB (ref 70–99)
Potassium: 4.1 mEq/L (ref 3.5–5.1)
Sodium: 137 mEq/L (ref 135–145)

## 2015-11-03 ENCOUNTER — Encounter: Payer: Self-pay | Admitting: Physician Assistant

## 2015-11-03 DIAGNOSIS — R350 Frequency of micturition: Secondary | ICD-10-CM | POA: Insufficient documentation

## 2015-11-03 LAB — CULTURE, URINE COMPREHENSIVE: ORGANISM ID, BACTERIA: NO GROWTH

## 2015-11-03 NOTE — Assessment & Plan Note (Signed)
Much improved. Asymptomatic. Continue lisinopril 5 mg daily.

## 2015-11-03 NOTE — Assessment & Plan Note (Signed)
Urine dip without sign of infection. Will send for culture. Supportive measures reviewed. Will treat based on culture results.

## 2015-11-03 NOTE — Assessment & Plan Note (Signed)
We'll obtain BMP today to reassess renal function. This also give me an idea of how much sugars have changed. 2 early to repeat A1c. If renal function stable, we will continue titrating metformin up to 2000 mg max daily. Diet and exercise reviewed with patient. He will pick up glucose meter and begin checking fasting sugars daily. The current goal for now his fasting sugar between 80 and 130. FU scheduled.

## 2015-12-06 ENCOUNTER — Encounter: Payer: Self-pay | Admitting: Physician Assistant

## 2015-12-06 ENCOUNTER — Ambulatory Visit (INDEPENDENT_AMBULATORY_CARE_PROVIDER_SITE_OTHER): Payer: BLUE CROSS/BLUE SHIELD | Admitting: Physician Assistant

## 2015-12-06 VITALS — BP 136/84 | HR 105 | Temp 98.6°F | Resp 16 | Ht 70.0 in | Wt 173.4 lb

## 2015-12-06 DIAGNOSIS — E785 Hyperlipidemia, unspecified: Secondary | ICD-10-CM | POA: Diagnosis not present

## 2015-12-06 DIAGNOSIS — I10 Essential (primary) hypertension: Secondary | ICD-10-CM

## 2015-12-06 DIAGNOSIS — E119 Type 2 diabetes mellitus without complications: Secondary | ICD-10-CM | POA: Diagnosis not present

## 2015-12-06 MED ORDER — ATORVASTATIN CALCIUM 10 MG PO TABS: 10.0000 mg | ORAL_TABLET | Freq: Every day | ORAL | 3 refills | Status: DC

## 2015-12-06 NOTE — Progress Notes (Signed)
Patient presents to clinic today for close follow-up of DM II uncontrolled without known complications. Patient is currently on Metformin 500 mg AM and 1000 mg PM. Endorses taking as directed. Fasting sugars averaging 120-130. Is watching what he is eating. Endorses resolution of polydipsia, polyphagia or polyuria.  Denies urinary urgency, frequency. Denies vision changes. Patient is taking his Lisinopril - 5 mg daily. Is taking as directed. Patient denies chest pain, palpitations, lightheadedness, dizziness, vision changes or frequent headaches.  BP Readings from Last 3 Encounters:  12/06/15 136/84  10/31/15 132/88  10/18/15 (!) 168/96   Past Medical History:  Diagnosis Date  . Asthma   . Diabetes mellitus without complication ([place])   . History of chicken pox   . History of DVT (deep vein thrombosis)    s/p trauma of R leg -- 2014. No other history of DVT  . History of kidney stones   . Hypertension   . Lazy eye of left side     Current Outpatient Prescriptions on File Prior to Visit  Medication Sig Dispense Refill  . blood glucose meter kit and supplies KIT Dispense based on patient and insurance preference. Use up to four times daily as directed. (FOR ICD-9 250.00, 250.01). 1 each 0  . lisinopril (PRINIVIL,ZESTRIL) 5 MG tablet Take 1 tablet (5 mg total) by mouth daily. 30 tablet 3  . metFORMIN (GLUCOPHAGE) 500 MG tablet Take 1 tablet by mouth daily x 1 week. Then increase to 1 tablet BID. (Patient taking differently: Take 500 mg by mouth 2 (two) times daily with a meal. Take 1 tablet by mouth daily x 1 week. Then increase to 1 tablet BID.) 60 tablet 3  . Multiple Vitamins-Minerals (MENS MULTIVITAMIN PLUS) TABS Take by mouth daily.     No current facility-administered medications on file prior to visit.     No Known Allergies  Family History  Problem Relation Age of Onset  . Heart disease Mother 50  . Arthritis Mother   . Heart disease Father 94  . Stroke Father   . Healthy  Brother   . Heart disease Maternal Grandmother   . Diabetes Maternal Uncle     Social History   Social History  . Marital status: Single    Spouse name: N/A  . Number of children: N/A  . Years of education: N/A   Occupational History  . [employer]    Social History Main Topics  . Smoking status: Never Smoker  . Smokeless tobacco: Never Used  . Alcohol use No  . Drug use: No  . Sexual activity: Not Currently    Partners: Female   Other Topics Concern  . None   Social History Narrative  . None    Review of Systems - See HPI.  All other ROS are negative.  BP 136/84 (BP Location: Left Arm, Cuff Size: Normal)   Pulse (!) 105   Temp 98.6 F (37 C) (Oral)   Resp 16   Ht 5' 10"  (1.778 m)   Wt 173 lb 6 oz (78.6 kg)   SpO2 98%   BMI 24.88 kg/m   Physical Exam  Constitutional: He is oriented to person, place, and time and well-developed, well-nourished, and in no distress.  HENT:  Head: Normocephalic and atraumatic.  Eyes: Conjunctivae are normal.  Neck: Neck supple.  Cardiovascular: Normal rate, regular rhythm, normal heart sounds and intact distal pulses.   Pulmonary/Chest: Effort normal and breath sounds normal. No  respiratory distress. He has no wheezes. He has no rales. He exhibits no tenderness.  Neurological: He is alert and oriented to person, place, and time.  Skin: Skin is warm and dry. No rash noted.  Psychiatric: Affect normal.  Vitals reviewed.   Recent Results (from the past 2160 hour(s))  Comprehensive metabolic panel     Status: Abnormal   Collection Time: 10/18/15  9:28 AM  Result Value Ref Range   Sodium 137 135 - 145 mEq/L   Potassium 4.0 3.5 - 5.1 mEq/L   Chloride 103 96 - 112 mEq/L   CO2 29 19 - 32 mEq/L   Glucose, Bld 328 (H) 70 - 99 mg/dL   BUN 14 6 - 23 mg/dL   Creatinine, Ser 1.10 0.40 - 1.50 mg/dL   Total Bilirubin 0.9 0.2 - 1.2 mg/dL   Alkaline Phosphatase 87 39 - 117 U/L   AST 16 0 - 37 U/L   ALT 19 0 -  53 U/L   Total Protein 8.2 6.0 - 8.3 g/dL   Albumin 4.3 3.5 - 5.2 g/dL   Calcium 9.6 8.4 - 10.5 mg/dL   GFR 91.30 >60.00 mL/min  Hemoglobin A1c     Status: Abnormal   Collection Time: 10/18/15  9:28 AM  Result Value Ref Range   Hgb A1c MFr Bld 13.1 (H) 4.6 - 6.5 %    Comment: Glycemic Control Guidelines for People with Diabetes:Non Diabetic:  <6%Goal of Therapy: <7%Additional Action Suggested:  >8%   Lipid panel     Status: Abnormal   Collection Time: 10/18/15  9:28 AM  Result Value Ref Range   Cholesterol 180 0 - 200 mg/dL    Comment: ATP III Classification       Desirable:  < 200 mg/dL               Borderline High:  200 - 239 mg/dL          High:  > = 240 mg/dL   Triglycerides 68.0 0.0 - 149.0 mg/dL    Comment: Normal:  <150 mg/dLBorderline High:  150 - 199 mg/dL   HDL 43.30 >39.00 mg/dL   VLDL 13.6 0.0 - 40.0 mg/dL   LDL Cholesterol 123 (H) 0 - 99 mg/dL   Total CHOL/HDL Ratio 4     Comment:                Men          Women1/2 Average Risk     3.4          3.3Average Risk          5.0          4.42X Average Risk          9.6          7.13X Average Risk          15.0          11.0                       NonHDL 136.30     Comment: NOTE:  Non-HDL goal should be 30 mg/dL higher than patient's LDL goal (i.e. LDL goal of < 70 mg/dL, would have non-HDL goal of < 100 mg/dL)  Urinalysis, Routine w reflex microscopic (not at [facility])     Status: Abnormal   Collection Time: 10/18/15  9:28 AM  Result Value Ref Range   Color, Urine YELLOW Yellow;Lt. Yellow   APPearance  CLEAR Clear   Specific Gravity, Urine 1.015 1.000 - 1.030   pH 5.5 5.0 - 8.0   Total Protein, Urine NEGATIVE Negative   Urine Glucose >=1000 (A) Negative   Ketones, ur NEGATIVE Negative   Bilirubin Urine NEGATIVE Negative   Hgb urine dipstick NEGATIVE Negative   Urobilinogen, UA 0.2 0.0 - 1.0   Leukocytes, UA NEGATIVE Negative   Nitrite NEGATIVE Negative   WBC, UA 0-2/hpf 0-2/hpf   RBC / HPF none seen 0-2/hpf   Squamous  Epithelial / LPF Rare(0-4/hpf) Rare(0-4/hpf)  Urine Microalbumin w/creat. ratio     Status: Abnormal   Collection Time: 10/18/15  9:28 AM  Result Value Ref Range   Microalb, Ur 6.0 (H) 0.0 - 1.9 mg/dL   Creatinine,U 88.5 mg/dL   Microalb Creat Ratio 6.8 0.0 - 30.0 mg/g  HM DIABETES EYE EXAM     Status: None   Collection Time: 10/27/15 12:00 AM  Result Value Ref Range   HM Diabetic Eye Exam No Retinopathy No Retinopathy    Comment: [facility] -- Dr. Marshall Cork  Basic metabolic panel     Status: Abnormal   Collection Time: 10/31/15  4:38 PM  Result Value Ref Range   Sodium 137 135 - 145 mEq/L   Potassium 4.1 3.5 - 5.1 mEq/L   Chloride 100 96 - 112 mEq/L   CO2 32 19 - 32 mEq/L   Glucose, Bld 183 (H) 70 - 99 mg/dL   BUN 14 6 - 23 mg/dL   Creatinine, Ser 0.82 0.40 - 1.50 mg/dL   Calcium 10.4 8.4 - 10.5 mg/dL   GFR 128.12 >60.00 mL/min  POCT urinalysis dipstick     Status: None   Collection Time: 10/31/15  5:16 PM  Result Value Ref Range   Color, UA golden    Clarity, UA clear    Glucose, UA neg    Bilirubin, UA neg    Ketones, UA neg    Spec Grav, UA >=1.030    Blood, UA neg    pH, UA 6.0    Protein, UA positive 1+    Urobilinogen, UA 0.2    Nitrite, UA neg    Leukocytes, UA Negative Negative  CULTURE, URINE COMPREHENSIVE     Status: None   Collection Time: 10/31/15  5:19 PM  Result Value Ref Range   Organism ID, Bacteria NO GROWTH     Assessment/Plan: Hypertension BP looks good on recheck. Asymptomatic. Continue Lisinopril 5 mg daily. FU scheduled.  Hyperlipidemia LDL goal <100 Will begin Lipitor 10 mg daily. Patient has started 81 mg ASA.  DM type 2 (diabetes mellitus, type 2) Fasting sugars 100-130 per patient. Is exercising and watching diet. Will check BMP and fructosamine today. Continue Metformin. Will alter based on results. FU scheduled.    Leeanne Rio, PA-C

## 2015-12-06 NOTE — Assessment & Plan Note (Signed)
BP looks good on recheck. Asymptomatic. Continue Lisinopril 5 mg daily. FU scheduled.

## 2015-12-06 NOTE — Assessment & Plan Note (Signed)
Fasting sugars 100-130 per patient. Is exercising and watching diet. Will check BMP and fructosamine today. Continue Metformin. Will alter based on results. FU scheduled.

## 2015-12-06 NOTE — Patient Instructions (Signed)
Please go to the lab for blood work. I will call you with your results. Please continue Metformin as directed. Continue the Lisinopril. I am starting you on daily Lipitor for cholesterol.  Please start a baby Aspirin (81 mg ) daily. The combination of all of these is to help reduce risk for stoke and heart attack.  Follow-up with me at the end of October so we can recheck A1C.

## 2015-12-06 NOTE — Addendum Note (Signed)
Addended by: Regis Bill on: 12/06/2015 03:28 PM   Modules accepted: Orders

## 2015-12-06 NOTE — Assessment & Plan Note (Signed)
Will begin Lipitor 10 mg daily. Patient has started 81 mg ASA.

## 2015-12-07 LAB — BASIC METABOLIC PANEL
BUN: 15 mg/dL (ref 6–23)
CALCIUM: 9.5 mg/dL (ref 8.4–10.5)
CHLORIDE: 107 meq/L (ref 96–112)
CO2: 25 meq/L (ref 19–32)
CREATININE: 0.8 mg/dL (ref 0.40–1.50)
GFR: 131.77 mL/min (ref >60.00)
Glucose, Bld: 140 mg/dL — ABNORMAL HIGH (ref 70–99)
Potassium: 3.8 mEq/L (ref 3.5–5.1)
Sodium: 141 mEq/L (ref 135–145)

## 2015-12-07 LAB — URIC ACID: Uric Acid, Serum: 2.7 mg/dL — ABNORMAL LOW (ref 4.0–7.8)

## 2015-12-08 LAB — FRUCTOSAMINE: Fructosamine: 352 umol/L — ABNORMAL HIGH (ref 190–270)

## 2015-12-11 ENCOUNTER — Telehealth: Payer: Self-pay | Admitting: *Deleted

## 2015-12-11 MED ORDER — METFORMIN HCL 1000 MG PO TABS: 1000.0000 mg | ORAL_TABLET | Freq: Two times a day (BID) | ORAL | 2 refills | Status: DC

## 2015-12-11 NOTE — Telephone Encounter (Signed)
-----   Message from Waldon Merl, PA-C sent at 12/10/2015 12:47 PM EDT ----- Labs in. Fructosamine level at 352 correlating to A1C around 8.5, which is a major improvement since last check. Want him to increase the Metformin to 1000 mg twice daily. Ok to send in new Rx with new dose to cut down on number of pills needed per day. Uric acid level was low. Do not feel there is a chronic gout issue present. Have him follow-up with me as scheduled.

## 2015-12-11 NOTE — Telephone Encounter (Signed)
Patient informed, understood & agreed; new Rx to pharmacy/SLS 09/18

## 2016-01-22 ENCOUNTER — Ambulatory Visit: Payer: BLUE CROSS/BLUE SHIELD | Admitting: Physician Assistant

## 2016-01-24 ENCOUNTER — Ambulatory Visit (INDEPENDENT_AMBULATORY_CARE_PROVIDER_SITE_OTHER): Payer: BLUE CROSS/BLUE SHIELD | Admitting: Physician Assistant

## 2016-01-24 ENCOUNTER — Encounter: Payer: Self-pay | Admitting: Physician Assistant

## 2016-01-24 VITALS — BP 138/86 | HR 95 | Temp 98.2°F | Ht 70.0 in | Wt 175.4 lb

## 2016-01-24 DIAGNOSIS — I10 Essential (primary) hypertension: Secondary | ICD-10-CM | POA: Diagnosis not present

## 2016-01-24 DIAGNOSIS — E119 Type 2 diabetes mellitus without complications: Secondary | ICD-10-CM

## 2016-01-24 LAB — BASIC METABOLIC PANEL
BUN: 11 mg/dL (ref 6–23)
CALCIUM: 9.9 mg/dL (ref 8.4–10.5)
CO2: 25 mEq/L (ref 19–32)
Chloride: 104 mEq/L (ref 96–112)
Creatinine, Ser: 0.76 mg/dL (ref 0.40–1.50)
GFR: 139.73 mL/min (ref >60.00)
GLUCOSE: 173 mg/dL — AB (ref 70–99)
Potassium: 3.8 mEq/L (ref 3.5–5.1)
Sodium: 138 mEq/L (ref 135–145)

## 2016-01-24 LAB — HEMOGLOBIN A1C: HEMOGLOBIN A1C: 7.4 % — AB (ref 4.6–6.5)

## 2016-01-24 MED ORDER — ALBUTEROL SULFATE HFA 108 (90 BASE) MCG/ACT IN AERS: 2.0000 | INHALATION_SPRAY | Freq: Four times a day (QID) | RESPIRATORY_TRACT | 0 refills | Status: DC | PRN

## 2016-01-24 NOTE — Progress Notes (Signed)
Patient presents to clinic today for follow-up of Diabetes Mellitus II without complication, previously uncontrolled. Patient is currently on a regimen Metformin 1000 mg BID. Is taking medications as directed. Immunizations are up-to-date. Foot exam and eye examination up-to-date. Fasting sugars are averaging around 100-110. Denies polyuria, polydipsia and polyphagia. Is currently on Lisinopril 5 mg daily. Is taking as directed. Patient denies chest pain, palpitations, lightheadedness, dizziness, vision changes or frequent headaches. Took medication 10 minutes before appointment.   Past Medical History:  Diagnosis Date  . Asthma   . Diabetes mellitus without complication ([place])   . History of chicken pox   . History of DVT (deep vein thrombosis)    s/p trauma of R leg -- 2014. No other history of DVT  . History of kidney stones   . Hypertension   . Lazy eye of left side     Current Outpatient Prescriptions on File Prior to Visit  Medication Sig Dispense Refill  . aspirin 81 MG chewable tablet Chew 81 mg by mouth daily.    Marland Kitchen atorvastatin (LIPITOR) 10 MG tablet Take 1 tablet (10 mg total) by mouth daily. 30 tablet 3  . blood glucose meter kit and supplies KIT Dispense based on patient and insurance preference. Use up to four times daily as directed. (FOR ICD-9 250.00, 250.01). 1 each 0  . lisinopril (PRINIVIL,ZESTRIL) 5 MG tablet Take 1 tablet (5 mg total) by mouth daily. 30 tablet 3  . metFORMIN (GLUCOPHAGE) 1000 MG tablet Take 1 tablet (1,000 mg total) by mouth 2 (two) times daily with a meal. 60 tablet 2  . Multiple Vitamins-Minerals (MENS MULTIVITAMIN PLUS) TABS Take by mouth daily.     No current facility-administered medications on file prior to visit.     No Known Allergies  Family History  Problem Relation Age of Onset  . Heart disease Mother 91  . Arthritis Mother   . Heart disease Father 4  . Stroke Father   . Healthy Brother   . Heart disease Maternal Grandmother   .  Diabetes Maternal Uncle     Social History   Social History  . Marital status: Single    Spouse name: N/A  . Number of children: N/A  . Years of education: N/A   Occupational History  . [employer]    Social History Main Topics  . Smoking status: Never Smoker  . Smokeless tobacco: Never Used  . Alcohol use No  . Drug use: No  . Sexual activity: Not Currently    Partners: Female   Other Topics Concern  . Not on file   Social History Narrative  . No narrative on file   Review of Systems - See HPI.  All other ROS are negative.  BP 138/86   Pulse 95   Temp 98.2 F (36.8 C) (Oral)   Ht 5' 10"  (1.778 m)   Wt 175 lb 6.4 oz (79.6 kg)   SpO2 100%   BMI 25.17 kg/m   Physical Exam  Constitutional: He is oriented to person, place, and time and well-developed, well-nourished, and in no distress.  HENT:  Head: Normocephalic and atraumatic.  Eyes: Conjunctivae are normal.  Cardiovascular: Normal rate, regular rhythm, normal heart sounds and intact distal pulses.   Pulmonary/Chest: Effort normal and breath sounds normal. No respiratory distress. He has no wheezes. He has no rales.  Neurological: He is alert and oriented to person, place, and time.  Skin: Skin is warm  and dry. No rash noted.  Psychiatric: Affect normal.  Vitals reviewed.   Recent Results (from the past 2160 hour(s))  HM DIABETES EYE EXAM     Status: None   Collection Time: 10/27/15 12:00 AM  Result Value Ref Range   HM Diabetic Eye Exam No Retinopathy No Retinopathy    Comment: [facility] -- Dr. Marshall Cork  Basic metabolic panel     Status: Abnormal   Collection Time: 10/31/15  4:38 PM  Result Value Ref Range   Sodium 137 135 - 145 mEq/L   Potassium 4.1 3.5 - 5.1 mEq/L   Chloride 100 96 - 112 mEq/L   CO2 32 19 - 32 mEq/L   Glucose, Bld 183 (H) 70 - 99 mg/dL   BUN 14 6 - 23 mg/dL   Creatinine, Ser 0.82 0.40 - 1.50 mg/dL   Calcium 10.4 8.4 - 10.5 mg/dL   GFR  128.12 >60.00 mL/min  POCT urinalysis dipstick     Status: None   Collection Time: 10/31/15  5:16 PM  Result Value Ref Range   Color, UA golden    Clarity, UA clear    Glucose, UA neg    Bilirubin, UA neg    Ketones, UA neg    Spec Grav, UA >=1.030    Blood, UA neg    pH, UA 6.0    Protein, UA positive 1+    Urobilinogen, UA 0.2    Nitrite, UA neg    Leukocytes, UA Negative Negative  CULTURE, URINE COMPREHENSIVE     Status: None   Collection Time: 10/31/15  5:19 PM  Result Value Ref Range   Organism ID, Bacteria NO GROWTH   Fructosamine     Status: Abnormal   Collection Time: 12/06/15  3:03 PM  Result Value Ref Range   Fructosamine 352 (H) 190 - 270 umol/L  Basic metabolic panel     Status: Abnormal   Collection Time: 12/06/15  3:03 PM  Result Value Ref Range   Sodium 141 135 - 145 mEq/L   Potassium 3.8 3.5 - 5.1 mEq/L   Chloride 107 96 - 112 mEq/L   CO2 25 19 - 32 mEq/L   Glucose, Bld 140 (H) 70 - 99 mg/dL   BUN 15 6 - 23 mg/dL   Creatinine, Ser 0.80 0.40 - 1.50 mg/dL   Calcium 9.5 8.4 - 10.5 mg/dL   GFR 131.77 >60.00 mL/min  Uric acid     Status: Abnormal   Collection Time: 12/06/15  3:03 PM  Result Value Ref Range   Uric Acid, Serum 2.7 (L) 4.0 - 7.8 mg/dL    Assessment/Plan: Hypertension BP improved on recheck. Patient had just taken medication. Asymptomatic. Continue current regimen.  DM type 2 (diabetes mellitus, type 2) Foot exam, Eye exam up-to-date. Immunizations up-to-date except influenza which patient declines. Fasting CBGs averaging low 100s. Will repeat A1C today and BMP. Will alter regimen based on findings. Will plan on FU in 3 months. If A1C < 7.5 will FU 6 months.    Leeanne Rio, PA-C

## 2016-01-24 NOTE — Assessment & Plan Note (Signed)
BP improved on recheck. Patient had just taken medication. Asymptomatic. Continue current regimen.

## 2016-01-24 NOTE — Assessment & Plan Note (Signed)
Foot exam, Eye exam up-to-date. Immunizations up-to-date except influenza which patient declines. Fasting CBGs averaging low 100s. Will repeat A1C today and BMP. Will alter regimen based on findings. Will plan on FU in 3 months. If A1C < 7.5 will FU 6 months.

## 2016-01-24 NOTE — Patient Instructions (Signed)
Please restart your inhaler.  Continue chronic medications as directed.  I want you to return to the office next week for a quick BP check with the nurse. Make sure you have had your BP medication at least an hour before visit.  Follow-up will be based on your results.   Diabetes and Exercise Exercising regularly is important. It is not just about losing weight. It has many health benefits, such as:  Improving your overall fitness, flexibility, and endurance.  Increasing your bone density.  Helping with weight control.  Decreasing your body fat.  Increasing your muscle strength.  Reducing stress and tension.  Improving your overall health. People with diabetes who exercise gain additional benefits because exercise:  Reduces appetite.  Improves the body's use of blood sugar (glucose).  Helps lower or control blood glucose.  Decreases blood pressure.  Helps control blood lipids (such as cholesterol and triglycerides).  Improves the body's use of the hormone insulin by:  Increasing the body's insulin sensitivity.  Reducing the body's insulin needs.  Decreases the risk for heart disease because exercising:  Lowers cholesterol and triglycerides levels.  Increases the levels of good cholesterol (such as high-density lipoproteins [HDL]) in the body.  Lowers blood glucose levels. YOUR ACTIVITY PLAN  Choose an activity that you enjoy, and set realistic goals. To exercise safely, you should begin practicing any new physical activity slowly, and gradually increase the intensity of the exercise over time. Your health care provider or diabetes educator can help create an activity plan that works for you. General recommendations include:  Encouraging children to engage in at least 60 minutes of physical activity each day.  Stretching and performing strength training exercises, such as yoga or weight lifting, at least 2 times per week.  Performing a total of at least 150  minutes of moderate-intensity exercise each week, such as brisk walking or water aerobics.  Exercising at least 3 days per week, making sure you allow no more than 2 consecutive days to pass without exercising.  Avoiding long periods of inactivity (90 minutes or more). When you have to spend an extended period of time sitting down, take frequent breaks to walk or stretch. RECOMMENDATIONS FOR EXERCISING WITH TYPE 1 OR TYPE 2 DIABETES   Check your blood glucose before exercising. If blood glucose levels are greater than 240 mg/dL, check for urine ketones. Do not exercise if ketones are present.  Avoid injecting insulin into areas of the body that are going to be exercised. For example, avoid injecting insulin into:  The arms when playing tennis.  The legs when jogging.  Keep a record of:  Food intake before and after you exercise.  Expected peak times of insulin action.  Blood glucose levels before and after you exercise.  The type and amount of exercise you have done.  Review your records with your health care provider. Your health care provider will help you to develop guidelines for adjusting food intake and insulin amounts before and after exercising.  If you take insulin or oral hypoglycemic agents, watch for signs and symptoms of hypoglycemia. They include:  Dizziness.  Shaking.  Sweating.  Chills.  Confusion.  Drink plenty of water while you exercise to prevent dehydration or heat stroke. Body water is lost during exercise and must be replaced.  Talk to your health care provider before starting an exercise program to make sure it is safe for you. Remember, almost any type of activity is better than none.   This information  is not intended to replace advice given to you by your health care provider. Make sure you discuss any questions you have with your health care provider.   Document Released: 06/01/2003 Document Revised: 07/26/2014 Document Reviewed:  08/18/2012 Elsevier Interactive Patient Education [employer]

## 2016-02-01 ENCOUNTER — Other Ambulatory Visit: Payer: Self-pay | Admitting: Physician Assistant

## 2016-02-01 DIAGNOSIS — E119 Type 2 diabetes mellitus without complications: Secondary | ICD-10-CM

## 2016-02-01 DIAGNOSIS — I10 Essential (primary) hypertension: Secondary | ICD-10-CM

## 2016-02-12 ENCOUNTER — Ambulatory Visit: Payer: BLUE CROSS/BLUE SHIELD | Admitting: Physician Assistant

## 2016-02-12 DIAGNOSIS — Z0289 Encounter for other administrative examinations: Secondary | ICD-10-CM

## 2016-04-16 ENCOUNTER — Other Ambulatory Visit: Payer: Self-pay | Admitting: Physician Assistant

## 2016-04-16 DIAGNOSIS — E785 Hyperlipidemia, unspecified: Secondary | ICD-10-CM

## 2016-06-23 ENCOUNTER — Other Ambulatory Visit: Payer: Self-pay | Admitting: Physician Assistant

## 2016-06-23 DIAGNOSIS — E119 Type 2 diabetes mellitus without complications: Secondary | ICD-10-CM

## 2016-06-23 DIAGNOSIS — I10 Essential (primary) hypertension: Secondary | ICD-10-CM

## 2016-11-20 ENCOUNTER — Telehealth: Payer: Self-pay | Admitting: Emergency Medicine

## 2016-11-20 NOTE — Telephone Encounter (Signed)
LMOVM advising patient is due for DM follow up or CPE. He has not been seen since 01/2016. Please schedule patient for an appointment

## 2017-02-22 LAB — HM DIABETES EYE EXAM

## 2017-04-22 ENCOUNTER — Emergency Department (HOSPITAL_COMMUNITY): Payer: Self-pay

## 2017-04-22 ENCOUNTER — Emergency Department (HOSPITAL_COMMUNITY)
Admission: EM | Admit: 2017-04-22 | Discharge: 2017-04-22 | Disposition: A | Discharge status: Home / Self Care | Payer: Self-pay | Attending: Emergency Medicine | Admitting: Emergency Medicine

## 2017-04-22 ENCOUNTER — Encounter (HOSPITAL_COMMUNITY): Payer: Self-pay | Admitting: *Deleted

## 2017-04-22 ENCOUNTER — Other Ambulatory Visit: Payer: Self-pay

## 2017-04-22 DIAGNOSIS — Z7984 Long term (current) use of oral hypoglycemic drugs: Secondary | ICD-10-CM | POA: Insufficient documentation

## 2017-04-22 DIAGNOSIS — J181 Lobar pneumonia, unspecified organism: Secondary | ICD-10-CM | POA: Insufficient documentation

## 2017-04-22 DIAGNOSIS — J45909 Unspecified asthma, uncomplicated: Secondary | ICD-10-CM | POA: Insufficient documentation

## 2017-04-22 DIAGNOSIS — E119 Type 2 diabetes mellitus without complications: Secondary | ICD-10-CM | POA: Insufficient documentation

## 2017-04-22 DIAGNOSIS — Z79899 Other long term (current) drug therapy: Secondary | ICD-10-CM | POA: Insufficient documentation

## 2017-04-22 DIAGNOSIS — J189 Pneumonia, unspecified organism: Secondary | ICD-10-CM

## 2017-04-22 DIAGNOSIS — Z7982 Long term (current) use of aspirin: Secondary | ICD-10-CM | POA: Insufficient documentation

## 2017-04-22 DIAGNOSIS — I10 Essential (primary) hypertension: Secondary | ICD-10-CM | POA: Insufficient documentation

## 2017-04-22 LAB — URINALYSIS, ROUTINE W REFLEX MICROSCOPIC
BACTERIA UA: NONE SEEN
BILIRUBIN URINE: NEGATIVE
Glucose, UA: >=500 mg/dL — AB
Ketones, ur: 20 mg/dL — AB
LEUKOCYTES UA: NEGATIVE
NITRITE: NEGATIVE
Specific Gravity, Urine: 1.025 (ref 1.005–1.030)
pH: 5 (ref 5.0–8.0)

## 2017-04-22 LAB — COMPREHENSIVE METABOLIC PANEL
ALBUMIN: 3.2 g/dL — AB (ref 3.5–5.0)
ALT: 27 U/L (ref 17–63)
ANION GAP: 10 (ref 5–15)
AST: 40 U/L (ref 15–41)
Alkaline Phosphatase: 68 U/L (ref 38–126)
BILIRUBIN TOTAL: 1 mg/dL (ref 0.3–1.2)
BUN: 10 mg/dL (ref 6–20)
CO2: 20 mmol/L — ABNORMAL LOW (ref 22–32)
Calcium: 8.6 mg/dL — ABNORMAL LOW (ref 8.9–10.3)
Chloride: 102 mmol/L (ref 101–111)
Creatinine, Ser: 0.89 mg/dL (ref 0.61–1.24)
GFR calc Af Amer: >60 mL/min (ref >60)
GFR calc non Af Amer: >60 mL/min (ref >60)
GLUCOSE: 335 mg/dL — AB (ref 65–99)
POTASSIUM: 3.9 mmol/L (ref 3.5–5.1)
Sodium: 132 mmol/L — ABNORMAL LOW (ref 135–145)
TOTAL PROTEIN: 7.4 g/dL (ref 6.5–8.1)

## 2017-04-22 LAB — CBC WITH DIFFERENTIAL/PLATELET
BASOS ABS: 0.1 10*3/uL (ref 0.0–0.1)
Basophils Relative: 1 %
Eosinophils Absolute: 0 10*3/uL (ref 0.0–0.7)
Eosinophils Relative: 0 %
HEMATOCRIT: 38.6 % — AB (ref 39.0–52.0)
HEMOGLOBIN: 13.5 g/dL (ref 13.0–17.0)
LYMPHS PCT: 35 %
Lymphs Abs: 2.2 10*3/uL (ref 0.7–4.0)
MCH: 31 pg (ref 26.0–34.0)
MCHC: 35 g/dL (ref 30.0–36.0)
MCV: 88.5 fL (ref 78.0–100.0)
MONOS PCT: 9 %
Monocytes Absolute: 0.6 10*3/uL (ref 0.1–1.0)
Neutro Abs: 3.4 10*3/uL (ref 1.7–7.7)
Neutrophils Relative %: 55 %
Platelets: 185 10*3/uL (ref 150–400)
RBC: 4.36 MIL/uL (ref 4.22–5.81)
RDW: 12.4 % (ref 11.5–15.5)
WBC: 6.3 10*3/uL (ref 4.0–10.5)

## 2017-04-22 LAB — I-STAT CG4 LACTIC ACID, ED: LACTIC ACID, VENOUS: 1.19 mmol/L (ref 0.5–1.9)

## 2017-04-22 MED ORDER — AZITHROMYCIN 250 MG PO TABS: 250.0000 mg | ORAL_TABLET | Freq: Every day | ORAL | 0 refills | Status: DC

## 2017-04-22 MED ORDER — ALBUTEROL SULFATE HFA 108 (90 BASE) MCG/ACT IN AERS
1.0000 | INHALATION_SPRAY | Freq: Once | RESPIRATORY_TRACT | Status: AC
  Administered 2017-04-22: 1 via RESPIRATORY_TRACT
  Filled 2017-04-22: qty 6.7

## 2017-04-22 MED ORDER — IPRATROPIUM-ALBUTEROL 0.5-2.5 (3) MG/3ML IN SOLN
3.0000 mL | Freq: Once | RESPIRATORY_TRACT | Status: AC
  Administered 2017-04-22: 3 mL via RESPIRATORY_TRACT
  Filled 2017-04-22: qty 3

## 2017-04-22 MED ORDER — AMOXICILLIN 500 MG PO CAPS: 500.0000 mg | ORAL_CAPSULE | Freq: Three times a day (TID) | ORAL | 0 refills | Status: DC

## 2017-04-22 MED ORDER — SODIUM CHLORIDE 0.9 % IV BOLUS (SEPSIS)
1000.0000 mL | Freq: Once | INTRAVENOUS | Status: AC
  Administered 2017-04-22: 1000 mL via INTRAVENOUS

## 2017-04-22 NOTE — ED Notes (Signed)
Breathing Treatment finished.

## 2017-04-22 NOTE — ED Provider Notes (Signed)
[facility] EMERGENCY DEPARTMENT Provider Note   CSN: [id number] Arrival date & time: 04/22/17  1250     History   Chief Complaint Chief Complaint  Patient presents with  . Cough    mask on patient  . Weakness  . Fever    HPI John [name] is a 52 y.o. male with past medical history of type 2 diabetes, hypertension, asthma, presenting to the ED for productive cough and fever since last night.  Patient states he had a cold last week with congestion and sore throat, which has improved, however he began having a worsening cough and fever last night.  He states he has been managing his asthma at home with albuterol.  States he feels slightly short of breath today.  No recent antibiotics, or hospital admissions.  The history is provided by the patient.    Past Medical History:  Diagnosis Date  . Asthma   . Diabetes mellitus without complication ([place])   . History of chicken pox   . History of DVT (deep vein thrombosis)    s/p trauma of R leg -- 2014. No other history of DVT  . History of kidney stones   . Hypertension   . Lazy eye of left side     Patient Active Problem List   Diagnosis Date Noted  . Hyperlipidemia LDL goal <100 12/06/2015  . Decreased libido 10/18/2015  . Hypertension 04/06/2012  . DM type 2 (diabetes mellitus, type 2) ([place]) 03/20/2012  . History of DVT (deep vein thrombosis) 03/19/2012    Past Surgical History:  Procedure Laterality Date  . EYE SURGERY         Home Medications    Prior to Admission medications   Medication Sig Start Date End Date Taking? Authorizing Provider  albuterol (PROVENTIL HFA;VENTOLIN HFA) 108 (90 Base) MCG/ACT inhaler Inhale 2 puffs into the lungs every 6 (six) hours as needed for wheezing or shortness of breath. 01/24/16   [name], PA-C  amoxicillin (AMOXIL) 500 MG capsule Take 1 capsule (500 mg total) by mouth 3 (three) times daily. 04/22/17   Robinson, [place] N, PA-C  aspirin 81 MG chewable  tablet Chew 81 mg by mouth daily.    [provider]  atorvastatin (LIPITOR) 10 MG tablet TAKE 1 TABLET EVERY DAY 04/16/16   [name], PA-C  azithromycin (ZITHROMAX) 250 MG tablet Take 1 tablet (250 mg total) by mouth daily. Take first 2 tablets together, then 1 every day until finished. 04/22/17   Robinson, [place] N, PA-C  blood glucose meter kit and supplies KIT Dispense based on patient and insurance preference. Use up to four times daily as directed. (FOR ICD-9 250.00, 250.01). 10/18/15   [name], PA-C  lisinopril (PRINIVIL,ZESTRIL) 5 MG tablet TAKE 1 TABLET (5 MG TOTAL) BY MOUTH DAILY. 06/24/16   [name], PA-C  metFORMIN (GLUCOPHAGE) 1000 MG tablet TAKE 1 TABLET BY MOUTH TWICE A DAY WITH A MEAL 04/16/16   [name], PA-C  Multiple Vitamins-Minerals (MENS MULTIVITAMIN PLUS) TABS Take by mouth daily.    [provider]    Family History Family History  Problem Relation Age of Onset  . Heart disease Mother 50  . Arthritis Mother   . Heart disease Father 38  . Stroke Father   . Healthy Brother   . Heart disease Maternal Grandmother   . Diabetes Maternal Uncle     Social History Social History   Tobacco Use  .  Smoking status: Never Smoker  . Smokeless tobacco: Never Used  Substance Use Topics  . Alcohol use: No  . Drug use: No     Allergies   Patient has no known allergies.   Review of Systems Review of Systems  Constitutional: Positive for fever.  HENT: Negative for congestion, ear pain and sore throat.   Respiratory: Positive for cough and shortness of breath.   Cardiovascular: Negative for chest pain.  All other systems reviewed and are negative.    Physical Exam Updated Vital Signs BP (!) 163/88 (BP Location: Left Arm)   Pulse (!) 110   Temp 98.5 F (36.9 C) (Oral)   Resp 18   SpO2 97%   Physical Exam  Constitutional: He appears well-developed and well-nourished. No distress.  Tolerating secretions.  No  increased work of breathing.  HENT:  Head: Normocephalic and atraumatic.  Mouth/Throat: Oropharynx is clear and moist.  Eyes: Conjunctivae are normal.  Neck: Normal range of motion. Neck supple. No JVD present. No tracheal deviation present.  Cardiovascular: Regular rhythm, normal heart sounds and intact distal pulses.  Slightly tachycardic  Pulmonary/Chest: Effort normal. No stridor. No respiratory distress. He has no wheezes. He has no rales.  decreased lung sounds left lower lung.  Abdominal: Soft. Bowel sounds are normal. He exhibits no distension. There is no tenderness.  Lymphadenopathy:    He has no cervical adenopathy.  Neurological: He is alert.  Skin: Skin is warm.  Psychiatric: He has a normal mood and affect. His behavior is normal.  Nursing note and vitals reviewed.    ED Treatments / Results  Labs (all labs ordered are listed, but only abnormal results are displayed) Labs Reviewed  COMPREHENSIVE METABOLIC PANEL - Abnormal; Notable for the following components:      Result Value   Sodium 132 (*)    CO2 20 (*)    Glucose, Bld 335 (*)    Calcium 8.6 (*)    Albumin 3.2 (*)    All other components within normal limits  CBC WITH DIFFERENTIAL/PLATELET - Abnormal; Notable for the following components:   HCT 38.6 (*)    All other components within normal limits  URINALYSIS, ROUTINE W REFLEX MICROSCOPIC - Abnormal; Notable for the following components:   APPearance HAZY (*)    Glucose, UA >=500 (*)    Hgb urine dipstick SMALL (*)    Ketones, ur 20 (*)    Protein, ur >=300 (*)    Squamous Epithelial / LPF 0-5 (*)    All other components within normal limits  I-STAT CG4 LACTIC ACID, ED  I-STAT CG4 LACTIC ACID, ED    EKG  EKG Interpretation None       Radiology Dg Chest 2 View  Result Date: 04/22/2017 CLINICAL DATA:  Cold symptoms since last Tuesday. EXAM: CHEST  2 VIEW COMPARISON:  None. FINDINGS: The heart size and mediastinal contours are within normal  limits. There is patchy consolidation of the left mid and lung base. There is no pleural effusion or pulmonary edema. The visualized skeletal structures are unremarkable. IMPRESSION: Left mid and lung base pneumonia. Electronically Signed   By: Abelardo Diesel M.D.   On: 04/22/2017 14:47    Procedures Procedures (including critical care time)  Medications Ordered in ED Medications  sodium chloride 0.9 % bolus 1,000 mL (0 mLs Intravenous Stopped 04/22/17 2042)  ipratropium-albuterol (DUONEB) 0.5-2.5 (3) MG/3ML nebulizer solution 3 mL (3 mLs Nebulization Given 04/22/17 1843)  albuterol (PROVENTIL HFA;VENTOLIN HFA) 108 (90 Base)  MCG/ACT inhaler 1 puff (1 puff Inhalation Given 04/22/17 2030)     Initial Impression / Assessment and Plan / ED Course  I have reviewed the triage vital signs and the nursing notes.  Pertinent labs & imaging results that were available during my care of the patient were reviewed by me and considered in my medical decision making (see chart for details).  Clinical Course as of Apr 22 2057  Tue Apr 22, 2017  1927 Pt re-evaluated; improvement in SOB, feels he is at his baseline. Will recheck VS once IVF finish, with anticipated discharge.  [JR]    Clinical Course User Index [JR] Robinson, [place] N, PA-C    Patient has been diagnosed with CAP via chest xray. Pt is not ill appearing, immunocompromised, and does not have multiple co morbidities. Pt with improvement in SOB to baseline with duoneb. Pt mildly tachycardic, though given IVF. Suspect persistent tachycardia is 2/t to albuterol. Afebrile, nontoxic, not in distress. Pt has been advised to return to the ED if symptoms worsen or they do not improve. Pt to follow up with PCP. Safe for discharge.  Patient discussed with Dr. Hillard Danker, who agrees with care plan.  Discussed results, findings, treatment and follow up. Patient advised of return precautions. Patient verbalized understanding and agreed with plan.   Final  Clinical Impressions(s) / ED Diagnoses   Final diagnoses:  Community acquired pneumonia of left lower lobe of lung [facility])    ED Discharge Orders        Ordered    azithromycin (ZITHROMAX) 250 MG tablet  Daily     04/22/17 2026    amoxicillin (AMOXIL) 500 MG capsule  3 times daily     04/22/17 2026       Robinson, [place] N, PA-C 04/22/17 2059    Dorie Rank, MD 04/23/17 [phone]

## 2017-04-22 NOTE — ED Notes (Signed)
Pt is resting and appears comfortable with family at bedside.

## 2017-04-22 NOTE — ED Triage Notes (Signed)
PT went to urgent care and has had cold symptoms since last Tuesday.  Pt has asthma. Pt states fever and cough last night.  This am nauseated and vomited.  Pt reports weak and states there was something abnormal in his urine like "protein".

## 2017-04-22 NOTE — Discharge Instructions (Signed)
Please read instructions below. Take the antibiotics,as prescribed until they are gone. Use the albuterol inhaler every 6 hours as needed for shortness of breath. You can take Tylenol as needed for fever. Schedule an appointment with your primary care provider to follow up on your visit today. Return to the ER for worsening shortness of breath not relieved by albuterol, or new or worsening symptoms.

## 2017-04-22 NOTE — ED Notes (Signed)
Results reviewed

## 2017-04-24 ENCOUNTER — Telehealth: Payer: Self-pay

## 2017-04-24 NOTE — Telephone Encounter (Signed)
LM requesting call back to schedule ER f/u with PCP.  

## 2017-04-25 ENCOUNTER — Encounter: Payer: Self-pay | Admitting: Physician Assistant

## 2017-04-25 ENCOUNTER — Emergency Department (HOSPITAL_BASED_OUTPATIENT_CLINIC_OR_DEPARTMENT_OTHER): Payer: BLUE CROSS/BLUE SHIELD

## 2017-04-25 ENCOUNTER — Emergency Department (HOSPITAL_BASED_OUTPATIENT_CLINIC_OR_DEPARTMENT_OTHER)
Admission: EM | Admit: 2017-04-25 | Discharge: 2017-04-25 | Disposition: A | Discharge status: Home / Self Care | Payer: BLUE CROSS/BLUE SHIELD | Attending: Emergency Medicine | Admitting: Emergency Medicine

## 2017-04-25 ENCOUNTER — Encounter (HOSPITAL_BASED_OUTPATIENT_CLINIC_OR_DEPARTMENT_OTHER): Payer: Self-pay

## 2017-04-25 ENCOUNTER — Other Ambulatory Visit: Payer: Self-pay

## 2017-04-25 ENCOUNTER — Ambulatory Visit (INDEPENDENT_AMBULATORY_CARE_PROVIDER_SITE_OTHER): Payer: BLUE CROSS/BLUE SHIELD | Admitting: Physician Assistant

## 2017-04-25 VITALS — BP 134/93 | HR 117 | Temp 98.3°F | Resp 17 | Ht 70.0 in | Wt 173.2 lb

## 2017-04-25 DIAGNOSIS — J189 Pneumonia, unspecified organism: Secondary | ICD-10-CM | POA: Diagnosis present

## 2017-04-25 DIAGNOSIS — K2900 Acute gastritis without bleeding: Secondary | ICD-10-CM | POA: Insufficient documentation

## 2017-04-25 DIAGNOSIS — E860 Dehydration: Secondary | ICD-10-CM | POA: Diagnosis not present

## 2017-04-25 DIAGNOSIS — J45909 Unspecified asthma, uncomplicated: Secondary | ICD-10-CM | POA: Diagnosis not present

## 2017-04-25 DIAGNOSIS — I10 Essential (primary) hypertension: Secondary | ICD-10-CM | POA: Diagnosis not present

## 2017-04-25 DIAGNOSIS — R1110 Vomiting, unspecified: Secondary | ICD-10-CM

## 2017-04-25 DIAGNOSIS — J181 Lobar pneumonia, unspecified organism: Secondary | ICD-10-CM

## 2017-04-25 DIAGNOSIS — R197 Diarrhea, unspecified: Secondary | ICD-10-CM

## 2017-04-25 DIAGNOSIS — E119 Type 2 diabetes mellitus without complications: Secondary | ICD-10-CM

## 2017-04-25 LAB — COMPREHENSIVE METABOLIC PANEL
ALK PHOS: 82 U/L (ref 38–126)
ALT: 22 U/L (ref 17–63)
AST: 33 U/L (ref 15–41)
Albumin: 2.7 g/dL — ABNORMAL LOW (ref 3.5–5.0)
Anion gap: 10 (ref 5–15)
BUN: 12 mg/dL (ref 6–20)
CALCIUM: 9 mg/dL (ref 8.9–10.3)
CHLORIDE: 99 mmol/L — AB (ref 101–111)
CO2: 26 mmol/L (ref 22–32)
CREATININE: 1.26 mg/dL — AB (ref 0.61–1.24)
GFR calc non Af Amer: >60 mL/min (ref >60)
Glucose, Bld: 398 mg/dL — ABNORMAL HIGH (ref 65–99)
Potassium: 4.5 mmol/L (ref 3.5–5.1)
Sodium: 135 mmol/L (ref 135–145)
Total Bilirubin: 0.8 mg/dL (ref 0.3–1.2)
Total Protein: 7.8 g/dL (ref 6.5–8.1)

## 2017-04-25 LAB — CBC WITH DIFFERENTIAL/PLATELET
BASOS PCT: 0 %
Basophils Absolute: 0 10*3/uL (ref 0.0–0.1)
EOS ABS: 0 10*3/uL (ref 0.0–0.7)
Eosinophils Relative: 0 %
HCT: 43.7 % (ref 39.0–52.0)
HEMOGLOBIN: 15 g/dL (ref 13.0–17.0)
LYMPHS PCT: 22 %
Lymphs Abs: 2.1 10*3/uL (ref 0.7–4.0)
MCH: 30.6 pg (ref 26.0–34.0)
MCHC: 34.3 g/dL (ref 30.0–36.0)
MCV: 89.2 fL (ref 78.0–100.0)
Monocytes Absolute: 1.1 10*3/uL — ABNORMAL HIGH (ref 0.1–1.0)
Monocytes Relative: 12 %
NEUTROS PCT: 66 %
Neutro Abs: 6.2 10*3/uL (ref 1.7–7.7)
PLATELETS: 310 10*3/uL (ref 150–400)
RBC: 4.9 MIL/uL (ref 4.22–5.81)
RDW: 12.3 % (ref 11.5–15.5)
WBC: 9.4 10*3/uL (ref 4.0–10.5)

## 2017-04-25 LAB — CBG MONITORING, ED
GLUCOSE-CAPILLARY: 277 mg/dL — AB (ref 65–99)
Glucose-Capillary: 383 mg/dL — ABNORMAL HIGH (ref 65–99)

## 2017-04-25 LAB — POCT CBG (FASTING - GLUCOSE)-MANUAL ENTRY: Glucose Fasting, POC: 430 mg/dL — AB (ref 70–99)

## 2017-04-25 LAB — LIPASE, BLOOD: Lipase: 39 U/L (ref 11–51)

## 2017-04-25 LAB — POCT GLYCOSYLATED HEMOGLOBIN (HGB A1C): Hemoglobin A1C: 13.3

## 2017-04-25 MED ORDER — PANTOPRAZOLE SODIUM 40 MG IV SOLR
40.0000 mg | Freq: Once | INTRAVENOUS | Status: AC
  Administered 2017-04-25: 40 mg via INTRAVENOUS
  Filled 2017-04-25: qty 40

## 2017-04-25 MED ORDER — DICYCLOMINE HCL 10 MG/ML IM SOLN
20.0000 mg | Freq: Once | INTRAMUSCULAR | Status: AC
  Administered 2017-04-25: 20 mg via INTRAMUSCULAR
  Filled 2017-04-25: qty 2

## 2017-04-25 MED ORDER — SODIUM CHLORIDE 0.9 % IV BOLUS (SEPSIS)
1000.0000 mL | Freq: Once | INTRAVENOUS | Status: AC
  Administered 2017-04-25: 1000 mL via INTRAVENOUS

## 2017-04-25 MED ORDER — GI COCKTAIL ~~LOC~~
30.0000 mL | Freq: Once | ORAL | Status: AC
Start: 2017-04-25 — End: 2017-04-25
  Administered 2017-04-25: 30 mL via ORAL
  Filled 2017-04-25: qty 30

## 2017-04-25 MED ORDER — ONDANSETRON 4 MG PO TBDP: 4.0000 mg | ORAL_TABLET | Freq: Three times a day (TID) | ORAL | 0 refills | Status: DC | PRN

## 2017-04-25 MED ORDER — PANTOPRAZOLE SODIUM 20 MG PO TBEC: 40.0000 mg | DELAYED_RELEASE_TABLET | Freq: Every day | ORAL | 0 refills | Status: DC

## 2017-04-25 MED ORDER — ONDANSETRON HCL 4 MG/2ML IJ SOLN
4.0000 mg | Freq: Once | INTRAMUSCULAR | Status: AC
  Administered 2017-04-25: 4 mg via INTRAVENOUS
  Filled 2017-04-25: qty 2

## 2017-04-25 MED FILL — ONDANSETRON ODT 4 MG TABLET: 4 | 2 days supply | Qty: 9 | Fill #0

## 2017-04-25 MED FILL — PANTOPRAZOLE SOD DR 20 MG T: 20 | 7 days supply | Qty: 14 | Fill #0

## 2017-04-25 NOTE — ED Notes (Signed)
Patient transported to X-ray 

## 2017-04-25 NOTE — Progress Notes (Signed)
Patient presents to clinic today for ER follow-up. Patient was seen in ER 04/22/17 for URI symptoms. Was diagnosed with CAP and started on Amoxicillin 500 mg TID and a Z-pack. Is taking as directed. Has noted significant nausea since starting the medication, now with vomiting and diarrhea. Is trying to stay hydrated. Has not had anything to eat in past 24 hours. Denies fever, chills. Denies headache or AMS. Notes significant fatigue and the need to lay down. Patient with DM II, initially stating he is taking medication as directed. Has not been seen since 2017 and medication refills are all expired for quite some time on check with pharmacy. After questioning he notes he is not taking medications as directed. Is not checking his sugars as directed. POC fasting CBG checked by CMA at 430.    Past Medical History:  Diagnosis Date  . Asthma   . Diabetes mellitus without complication ([place])   . History of chicken pox   . History of DVT (deep vein thrombosis)    s/p trauma of R leg -- 2014. No other history of DVT  . History of kidney stones   . Hypertension   . Lazy eye of left side     Current Outpatient Medications on File Prior to Visit  Medication Sig Dispense Refill  . albuterol (PROVENTIL HFA;VENTOLIN HFA) 108 (90 Base) MCG/ACT inhaler Inhale 2 puffs into the lungs every 6 (six) hours as needed for wheezing or shortness of breath. 1 Inhaler 0  . amoxicillin (AMOXIL) 500 MG capsule Take 1 capsule (500 mg total) by mouth 3 (three) times daily. 21 capsule 0  . aspirin 81 MG chewable tablet Chew 81 mg by mouth daily.    Marland Kitchen azithromycin (ZITHROMAX) 250 MG tablet Take 1 tablet (250 mg total) by mouth daily. Take first 2 tablets together, then 1 every day until finished. 6 tablet 0  . blood glucose meter kit and supplies KIT Dispense based on patient and insurance preference. Use up to four times daily as directed. (FOR ICD-9 250.00, 250.01). 1 each 0  . Multiple Vitamins-Minerals (MENS MULTIVITAMIN  PLUS) TABS Take by mouth daily.    Marland Kitchen atorvastatin (LIPITOR) 10 MG tablet TAKE 1 TABLET EVERY DAY (Patient not taking: Reported on 04/25/2017) 30 tablet 5  . lisinopril (PRINIVIL,ZESTRIL) 5 MG tablet TAKE 1 TABLET (5 MG TOTAL) BY MOUTH DAILY. (Patient not taking: Reported on 04/25/2017) 30 tablet 3  . metFORMIN (GLUCOPHAGE) 1000 MG tablet TAKE 1 TABLET BY MOUTH TWICE A DAY WITH A MEAL (Patient not taking: Reported on 04/25/2017) 60 tablet 5   No current facility-administered medications on file prior to visit.     No Known Allergies  Family History  Problem Relation Age of Onset  . Heart disease Mother 60  . Arthritis Mother   . Heart disease Father 14  . Stroke Father   . Healthy Brother   . Heart disease Maternal Grandmother   . Diabetes Maternal Uncle     Social History   Socioeconomic History  . Marital status: Single    Spouse name: None  . Number of children: None  . Years of education: None  . Highest education level: None  Social Needs  . Financial resource strain: None  . Food insecurity - worry: None  . Food insecurity - inability: None  . Transportation needs - medical: None  . Transportation needs - non-medical: None  Occupational History  . 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 Smoking status: Never Smoker  . Smokeless tobacco: Never Used  Substance and Sexual Activity  . Alcohol use: No  . Drug use: No  . Sexual activity: Not Currently    Partners: Female  Other Topics Concern  . None  Social History Narrative  . None   Review of Systems - See HPI.  All other ROS are negative.  BP (!) 134/93   Pulse (!) 117   Temp 98.3 F (36.8 C) (Oral)   Resp 17   Ht '5\' 10"'$  (1.778 m)   Wt 173 lb 4 oz (78.6 kg)   SpO2 97%   BMI 24.86 kg/m   Physical Exam  Constitutional: He is oriented to person, place, and time and well-developed, well-nourished, and in no distress.  HENT:  Head: Normocephalic and atraumatic.  Right Ear:  Tympanic membrane and external ear normal.  Left Ear: Tympanic membrane and external ear normal.  Nose: Nose normal.  Mouth/Throat: Uvula is midline and oropharynx is clear and moist. Mucous membranes are dry.  Neck: Neck supple.  Cardiovascular: Regular rhythm, normal heart sounds and intact distal pulses. Tachycardia present.  Pulmonary/Chest: Effort normal and breath sounds normal. No respiratory distress. He has no wheezes. He has no rales. He exhibits no tenderness.  Neurological: He is alert and oriented to person, place, and time.  Skin: Skin is warm and dry. No rash noted.    Recent Results (from the past 2160 hour(s))  [facility] DIABETES EYE EXAM     Status: None   Collection Time: 02/22/17 12:00 AM  Result Value Ref Range   HM Diabetic Eye Exam No Retinopathy No Retinopathy  Comprehensive metabolic panel     Status: Abnormal   Collection Time: 04/22/17  1:44 PM  Result Value Ref Range   Sodium 132 (L) 135 - 145 mmol/L   Potassium 3.9 3.5 - 5.1 mmol/L   Chloride 102 101 - 111 mmol/L   CO2 20 (L) 22 - 32 mmol/L   Glucose, Bld 335 (H) 65 - 99 mg/dL   BUN 10 6 - 20 mg/dL   Creatinine, Ser 0.89 0.61 - 1.24 mg/dL   Calcium 8.6 (L) 8.9 - 10.3 mg/dL   Total Protein 7.4 6.5 - 8.1 g/dL   Albumin 3.2 (L) 3.5 - 5.0 g/dL   AST 40 15 - 41 U/L   ALT 27 17 - 63 U/L   Alkaline Phosphatase 68 38 - 126 U/L   Total Bilirubin 1.0 0.3 - 1.2 mg/dL   GFR calc non Af Amer >60 >60 mL/min   GFR calc Af Amer >60 >60 mL/min    Comment: (NOTE) The eGFR has been calculated using the CKD EPI equation. This calculation has not been validated in all clinical situations. eGFR's persistently <60 mL/min signify possible Chronic Kidney Disease.    Anion gap 10 5 - 15  CBC with Differential     Status: Abnormal   Collection Time: 04/22/17  1:44 PM  Result Value Ref Range   WBC 6.3 4.0 - 10.5 K/uL   RBC 4.36 4.22 - 5.81 MIL/uL   Hemoglobin 13.5 13.0 - 17.0 g/dL   HCT 38.6 (L) 39.0 - 52.0 %   MCV 88.5  78.0 - 100.0 fL   MCH 31.0 26.0 - 34.0 pg   MCHC 35.0 30.0 - 36.0 g/dL   RDW 12.4 11.5 - 15.5 %   Platelets 185 150 - 400 K/uL   Neutrophils Relative % 55 %   Lymphocytes Relative  35 %   Monocytes Relative 9 %   Eosinophils Relative 0 %   Basophils Relative 1 %   Neutro Abs 3.4 1.7 - 7.7 K/uL   Lymphs Abs 2.2 0.7 - 4.0 K/uL   Monocytes Absolute 0.6 0.1 - 1.0 K/uL   Eosinophils Absolute 0.0 0.0 - 0.7 K/uL   Basophils Absolute 0.1 0.0 - 0.1 K/uL   WBC Morphology MILD LEFT SHIFT (1-5% METAS, OCC MYELO, OCC BANDS)     Comment: ATYPICAL LYMPHOCYTES  Urinalysis, Routine w reflex microscopic     Status: Abnormal   Collection Time: 04/22/17  1:53 PM  Result Value Ref Range   Color, Urine YELLOW YELLOW   APPearance HAZY (A) CLEAR   Specific Gravity, Urine 1.025 1.005 - 1.030   pH 5.0 5.0 - 8.0   Glucose, UA >=500 (A) NEGATIVE mg/dL   Hgb urine dipstick SMALL (A) NEGATIVE   Bilirubin Urine NEGATIVE NEGATIVE   Ketones, ur 20 (A) NEGATIVE mg/dL   Protein, ur >=300 (A) NEGATIVE mg/dL   Nitrite NEGATIVE NEGATIVE   Leukocytes, UA NEGATIVE NEGATIVE   RBC / HPF 0-5 0 - 5 RBC/hpf   WBC, UA 0-5 0 - 5 WBC/hpf   Bacteria, UA NONE SEEN NONE SEEN   Squamous Epithelial / LPF 0-5 (A) NONE SEEN   Mucus PRESENT   I-Stat CG4 Lactic Acid, ED     Status: None   Collection Time: 04/22/17  1:58 PM  Result Value Ref Range   Lactic Acid, Venous 1.19 0.5 - 1.9 mmol/L  POCT CBG (Fasting - Glucose)     Status: Abnormal   Collection Time: 04/25/17 12:11 PM  Result Value Ref Range   Glucose Fasting, POC 430 (A) 70 - 99 mg/dL  POCT HgB A1C     Status: Abnormal   Collection Time: 04/25/17 12:11 PM  Result Value Ref Range   Hemoglobin A1C 13.3     Assessment/Plan: 1. Type 2 diabetes mellitus without complication, without long-term current use of insulin ([place]) 2. Community acquired pneumonia of left lower lobe of lung ([place]) 3. Essential hypertension 4. Vomiting and diarrhea   Patient with uncontrolled  Type II diabetes, out of medication for some time. Has not been seen since 2017 concerning chronic medical issues. Non-adherent with recommendations. Fasting glucose is 430. Signs of mild dehydration noted. No "fruity breath" noted. He has been having nausea and vomiting 2/2 antibiotics for CAP. Suspect Amoxicillin is most likely culprit. Has noted diarrhea as well. Noted respiratory symptoms are improved. Lungs CTAB. Concern for further deterioration and AKI due to vomiting, loose stools. Stop Amoxicillin. Patient sent to ER with girlfriend for IV Fluids and assessment. Will call patient to discuss chronic medications and follow-up once he is out of the ER.    Leeanne Rio, PA-C

## 2017-04-25 NOTE — ED Triage Notes (Signed)
Pt sent from PCP office-pt dx with PNE 1/29-was at PCP for f/u-sent for possible dehydration-pt c/o nausea, diarrhea x 1 episode-see PCP notes-pt NAD-steady gait

## 2017-04-25 NOTE — ED Notes (Signed)
ED Provider at bedside. 

## 2017-04-25 NOTE — ED Provider Notes (Signed)
[facility]ENC[facility] DEPARTMENT Provider Note   CSN: [id number] Arrival date & time: 04/25/17  1253     History   Chief Complaint Chief Complaint  Patient presents with  . Pneumonia    HPI John [name] is a 52 y.o. male.  HPI   Last Tuesday, had cold, was bed-ridden for about 3 days, then asthma was triggered then Fever started Sunday and Monday went to ER and was diagnosed with pneumonia.  Was vomiting prior.  Went to ED, coughing, diagnosed with pneumonia. Given abx. Stomach pain worsened, stopped eating, dry heaving.  Wednesday-today was still vomiting.  Saw PA Cody because found was dehydrated.  Thinks maybe amoxicillin bothering stomach. Not coughing.  Hurts in top of abdomen and LUQ, comes and goes.  Worsens with eating or drinking.  Can't keep anything solid on stomach. Can take in water but drinking or eating anything else makes it worse. Feels like gets full quickly.  Dry heaving at this point.  One episode of diarrhea yesterday.  No hx of abdominal surgery.  No urinary symptoms.  No fevers since the beginning of the week. Not taking ibuprofen.   Past Medical History:  Diagnosis Date  . Asthma   . Diabetes mellitus without complication ([place])   . History of chicken pox   . History of DVT (deep vein thrombosis)    s/p trauma of R leg -- 2014. No other history of DVT  . History of kidney stones   . Hypertension   . Lazy eye of left side     Patient Active Problem List   Diagnosis Date Noted  . Hyperlipidemia LDL goal <100 12/06/2015  . Decreased libido 10/18/2015  . Hypertension 04/06/2012  . DM type 2 (diabetes mellitus, type 2) ([place]) 03/20/2012  . History of DVT (deep vein thrombosis) 03/19/2012    Past Surgical History:  Procedure Laterality Date  . EYE SURGERY         Home Medications    Prior to Admission medications   Medication Sig Start Date End Date Taking? Authorizing Provider  albuterol (PROVENTIL HFA;VENTOLIN HFA) 108 (90 Base)  MCG/ACT inhaler Inhale 2 puffs into the lungs every 6 (six) hours as needed for wheezing or shortness of breath. 01/24/16   [name], PA-C  amoxicillin (AMOXIL) 500 MG capsule Take 1 capsule (500 mg total) by mouth 3 (three) times daily. 04/22/17   Robinson, [place] N, PA-C  aspirin 81 MG chewable tablet Chew 81 mg by mouth daily.    [provider]  atorvastatin (LIPITOR) 10 MG tablet TAKE 1 TABLET EVERY DAY Patient not taking: Reported on 04/25/2017 04/16/16   [name], PA-C  azithromycin (ZITHROMAX) 250 MG tablet Take 1 tablet (250 mg total) by mouth daily. Take first 2 tablets together, then 1 every day until finished. 04/22/17   Robinson, [place] N, PA-C  blood glucose meter kit and supplies KIT Dispense based on patient and insurance preference. Use up to four times daily as directed. (FOR ICD-9 250.00, 250.01). 10/18/15   [name], PA-C  lisinopril (PRINIVIL,ZESTRIL) 5 MG tablet TAKE 1 TABLET (5 MG TOTAL) BY MOUTH DAILY. Patient not taking: Reported on 04/25/2017 06/24/16   [name], PA-C  metFORMIN (GLUCOPHAGE) 1000 MG tablet TAKE 1 TABLET BY MOUTH TWICE A DAY WITH A MEAL Patient not taking: Reported on 04/25/2017 04/16/16   [name], PA-C  Multiple Vitamins-Minerals (MENS MULTIVITAMIN PLUS) TABS Take by mouth daily.    [provider]  ondansetron (ZOFRAN ODT) 4 MG disintegrating tablet Take 1 tablet (4 mg total) by mouth every 8 (eight) hours as needed for nausea or vomiting. 04/25/17   Gareth Morgan, MD  pantoprazole (PROTONIX) 20 MG tablet Take 2 tablets (40 mg total) by mouth daily for 7 days. 04/25/17 05/02/17  Gareth Morgan, MD    Family History Family History  Problem Relation Age of Onset  . Heart disease Mother 62  . Arthritis Mother   . Heart disease Father 51  . Stroke Father   . Healthy Brother   . Heart disease Maternal Grandmother   . Diabetes Maternal Uncle     Social History Social History   Tobacco Use  . Smoking  status: Never Smoker  . Smokeless tobacco: Never Used  Substance Use Topics  . Alcohol use: No  . Drug use: No     Allergies   Patient has no known allergies.   Review of Systems Review of Systems  Constitutional: Negative for fever (improved now).  HENT: Negative for sore throat.   Eyes: Negative for visual disturbance.  Respiratory: Positive for cough (improving). Negative for shortness of breath.   Cardiovascular: Negative for chest pain.  Gastrointestinal: Positive for abdominal pain, diarrhea (one episode), nausea and vomiting.  Genitourinary: Negative for difficulty urinating.  Musculoskeletal: Negative for back pain and neck stiffness.  Skin: Negative for rash.  Neurological: Positive for headaches (thinks from not eating). Negative for syncope.     Physical Exam Updated Vital Signs BP (!) 172/101 (BP Location: Left Arm)   Pulse 90   Temp 98.2 F (36.8 C) (Oral)   Resp 18   Wt 79.2 kg (174 lb 9.7 oz)   SpO2 100%   BMI 25.05 kg/m   Physical Exam  Constitutional: He is oriented to person, place, and time. He appears well-developed and well-nourished. No distress.  HENT:  Head: Normocephalic and atraumatic.  Eyes: Conjunctivae and EOM are normal.  Neck: Normal range of motion.  Cardiovascular: Normal rate, regular rhythm, normal heart sounds and intact distal pulses. Exam reveals no gallop and no friction rub.  No murmur heard. Pulmonary/Chest: Effort normal and breath sounds normal. No respiratory distress. He has no wheezes. He has no rales.  Abdominal: Soft. He exhibits no distension. There is tenderness (LUQ). There is no guarding.  Negative murphy's, no RUQ tenderness  Musculoskeletal: He exhibits no edema.  Neurological: He is alert and oriented to person, place, and time.  Skin: Skin is warm and dry. He is not diaphoretic.  Nursing note and vitals reviewed.    ED Treatments / Results  Labs (all labs ordered are listed, but only abnormal results  are displayed) Labs Reviewed  CBC WITH DIFFERENTIAL/PLATELET - Abnormal; Notable for the following components:      Result Value   Monocytes Absolute 1.1 (*)    All other components within normal limits  COMPREHENSIVE METABOLIC PANEL - Abnormal; Notable for the following components:   Chloride 99 (*)    Glucose, Bld 398 (*)    Creatinine, Ser 1.26 (*)    Albumin 2.7 (*)    All other components within normal limits  CBG MONITORING, ED - Abnormal; Notable for the following components:   Glucose-Capillary 383 (*)    All other components within normal limits  CBG MONITORING, ED - Abnormal; Notable for the following components:   Glucose-Capillary 277 (*)    All other components within normal limits  LIPASE, BLOOD    EKG  EKG  Interpretation None       Radiology Dg Chest 2 View  Result Date: 04/25/2017 CLINICAL DATA:  Cough, fever and congestion for 1 week. EXAM: CHEST  2 VIEW COMPARISON:  PA and lateral chest 04/22/2017. FINDINGS: Patchy airspace disease in the left lower lobe appears mildly improved. The right lung is clear. Heart size is normal. No pneumothorax or pleural effusion. IMPRESSION: Some improvement and patchy left lower lobe airspace disease. No new abnormality. Electronically Signed   By: Inge Rise M.D.   On: 04/25/2017 13:29    Procedures Procedures (including critical care time)  Medications Ordered in ED Medications  sodium chloride 0.9 % bolus 1,000 mL (0 mLs Intravenous Stopped 04/25/17 1418)  sodium chloride 0.9 % bolus 1,000 mL (0 mLs Intravenous Stopped 04/25/17 1538)  ondansetron (ZOFRAN) injection 4 mg (4 mg Intravenous Given 04/25/17 1417)  pantoprazole (PROTONIX) injection 40 mg (40 mg Intravenous Given 04/25/17 1417)  gi cocktail (Maalox,Lidocaine,Donnatal) (30 mLs Oral Given 04/25/17 1417)  dicyclomine (BENTYL) injection 20 mg (20 mg Intramuscular Given 04/25/17 1417)     Initial Impression / Assessment and Plan / ED Course  I have reviewed the  triage vital signs and the nursing notes.  Pertinent labs & imaging results that were available during my care of the patient were reviewed by me and considered in my medical decision making (see chart for details).     52 year old male with history above including and recent diagnosis of pneumonia days ago presents with concern for abdominal pain nausea and vomiting.  Pneumonia symptoms are improving, and chest x-ray is also improving on evaluation.  Abdominal exam is benign, with no right upper quadrant, no right lower quadrants, no left lower quadrant tenderness, no signs of distention or small bowel obstruction.  Have low suspicion for cholecystitis, appendicitis or diverticulitis.  Lipase and transaminases are within normal limits.  Suspect left upper quadrant pain is likely gastritis, post viral from recent illness, or possibly related to antibiotic treatment.  Patient's creatinine is mildly increased to 1.26 from prior.  He also has hyperglycemia, however no signs of DKA.  He is given 2 L of normal saline, zofran, GI cocktail, Bentyl and Protonix with improvement of symptoms.  Given prescription for Protonix and Zofran.  He is tolerating p.o.  Recommend follow-up with primary care physician, with recheck of creatinine in 1 week and continue hydration.  Final Clinical Impressions(s) / ED Diagnoses   Final diagnoses:  Dehydration  Acute gastritis without hemorrhage, unspecified gastritis type  Community acquired pneumonia of left lung, unspecified part of lung    ED Discharge Orders        Ordered    ondansetron (ZOFRAN ODT) 4 MG disintegrating tablet  Every 8 hours PRN     04/25/17 1525    pantoprazole (PROTONIX) 20 MG tablet  Daily     04/25/17 1525       Gareth Morgan, MD 04/25/17 Bosie Helper

## 2017-04-25 NOTE — Patient Instructions (Signed)
Please keep well-hydrated.  I am sending you to the ER for IV fluids due to signs of mild dehydration on exam, increased heart rate and significanly elevated fasting glucose.  This will help you feel better and lower sugars. I will be reviewing their notes so I can call you once discharge and alter your medications based on kidney function.   Stop the Amoxicillin as it is likely the cause of your nausea.  Finish entire course of azithromycin unless directed otherwise by ER physician.   We will schedule you a follow-up once you get your acute assessment.

## 2017-05-06 ENCOUNTER — Telehealth: Payer: Self-pay | Admitting: Physician Assistant

## 2017-05-06 NOTE — Telephone Encounter (Signed)
Copied from CRM [id number]. Topic: Inquiry >> May 06, 2017 10:20 AM Yvonna Alanis wrote: Reason for CRM: Patient called requesting a refill of metFORMIN (GLUCOPHAGE) 1000 MG tablet and his blood pressure medication (which the patient could not name at the time of this call). Patient did contact his pharmacy, but they stated that Selena Batten would have to send in the prescriptions. Patient's preferred pharmacy is  [id number] [place], [place] - [address] [phone] (Phone) [phone] (Fax)

## 2017-05-08 NOTE — Telephone Encounter (Signed)
Patient requested a refill on medications, refused to list specific meds. Requesting provider give him a call to verify which meds needs to be refilled. please advise. CB# [phone]

## 2017-05-08 NOTE — Telephone Encounter (Addendum)
Spoke with patient about medication questions. He is out of the Metformin and needed a refill.Will hold off on refilling medication until recheck of creatinine level at scheduled a hosp follow up

## 2017-05-09 ENCOUNTER — Other Ambulatory Visit: Payer: Self-pay

## 2017-05-09 ENCOUNTER — Encounter: Payer: Self-pay | Admitting: Physician Assistant

## 2017-05-09 ENCOUNTER — Ambulatory Visit (INDEPENDENT_AMBULATORY_CARE_PROVIDER_SITE_OTHER): Payer: BLUE CROSS/BLUE SHIELD | Admitting: Physician Assistant

## 2017-05-09 VITALS — BP 170/98 | HR 90 | Temp 97.8°F | Resp 16 | Ht 70.0 in | Wt 178.0 lb

## 2017-05-09 DIAGNOSIS — E785 Hyperlipidemia, unspecified: Secondary | ICD-10-CM

## 2017-05-09 DIAGNOSIS — E010 Iodine-deficiency related diffuse (endemic) goiter: Secondary | ICD-10-CM | POA: Diagnosis not present

## 2017-05-09 DIAGNOSIS — I10 Essential (primary) hypertension: Secondary | ICD-10-CM

## 2017-05-09 DIAGNOSIS — E119 Type 2 diabetes mellitus without complications: Secondary | ICD-10-CM

## 2017-05-09 DIAGNOSIS — N289 Disorder of kidney and ureter, unspecified: Secondary | ICD-10-CM

## 2017-05-09 DIAGNOSIS — Z1211 Encounter for screening for malignant neoplasm of colon: Secondary | ICD-10-CM | POA: Diagnosis not present

## 2017-05-09 MED ORDER — ONETOUCH ULTRASOFT LANCETS MISC: 12 refills | Status: DC

## 2017-05-09 MED ORDER — GLUCOSE BLOOD VI STRP: ORAL_STRIP | 12 refills | Status: DC

## 2017-05-09 MED ORDER — AMLODIPINE BESYLATE 5 MG PO TABS: 5.0000 mg | ORAL_TABLET | Freq: Every day | ORAL | 1 refills | Status: DC

## 2017-05-09 NOTE — Patient Instructions (Signed)
Please go to the lab today for blood work.  I will call you with your results. We will alter treatment regimen(s) if indicated by your results.   Please continue Metformin as directed for now. We are holding off on the lisinopril until we get lab results. We will restart this and change Metformin dose based on results. Start the Amlodipine once daily for blood pressure.  Watch diet and keep active. I am setting you up with a Podiatrist for diabetic foot care.  Get some OTC Lotrimin to apply to the feet as directed for yeast. The nail fungus will take more aggressive treatment. Will let you discuss with the Podiatrist.  You will be contacted for a screening colonoscopy.  You will also be contacted for an Ultrasound of your thyroid due to some enlargement noted on examination.   Please check fasting sugar every day and record.   Follow-up with me in 2 weeks for reassessment.

## 2017-05-10 LAB — COMPREHENSIVE METABOLIC PANEL
AG Ratio: 1.1 (calc) (ref 1.0–2.5)
ALBUMIN MSPROF: 3.5 g/dL — AB (ref 3.6–5.1)
ALKALINE PHOSPHATASE (APISO): 67 U/L (ref 40–115)
ALT: 14 U/L (ref 9–46)
AST: 15 U/L (ref 10–35)
BILIRUBIN TOTAL: 0.5 mg/dL (ref 0.2–1.2)
BUN: 14 mg/dL (ref 7–25)
CALCIUM: 8.9 mg/dL (ref 8.6–10.3)
CHLORIDE: 105 mmol/L (ref 98–110)
CO2: 24 mmol/L (ref 20–32)
CREATININE: 1.24 mg/dL (ref 0.70–1.33)
GLOBULIN: 3.3 g/dL (ref 1.9–3.7)
Glucose, Bld: 229 mg/dL — ABNORMAL HIGH (ref 65–99)
POTASSIUM: 3.8 mmol/L (ref 3.5–5.3)
Sodium: 140 mmol/L (ref 135–146)
Total Protein: 6.8 g/dL (ref 6.1–8.1)

## 2017-05-10 LAB — LIPID PANEL
CHOLESTEROL: 245 mg/dL — AB (ref <200)
HDL: 49 mg/dL (ref >40)
LDL CHOLESTEROL (CALC): 176 mg/dL — AB
Non-HDL Cholesterol (Calc): 196 mg/dL (calc) — ABNORMAL HIGH (ref <130)
TRIGLYCERIDES: 89 mg/dL (ref <150)
Total CHOL/HDL Ratio: 5 (calc) — ABNORMAL HIGH (ref <5.0)

## 2017-05-10 LAB — TSH: TSH: 0.82 mIU/L (ref 0.40–4.50)

## 2017-05-10 LAB — T4, FREE: Free T4: 1.1 ng/dL (ref 0.8–1.8)

## 2017-05-10 LAB — EXTRA LAV TOP TUBE

## 2017-05-13 NOTE — Progress Notes (Signed)
Patient presents to clinic today for follow-up regarding diabetes. At last visit on 04/25/17, patient was sent to the ER due to significant hyperglycemia due to hemoconcentration related to dehydration. ER assessment included CBG monitoring, CMP with mild elevation in creatinine to 1.26, negative CXR. Patient was hydrated with 2 boluses of NaCl. Patient was discharged with Zofran and Pantoprazole for gastritis. Was instructed to hold lisinopril until his follow-up here in PCP office.   Since discharge, patient endorses doing very well overall. Denies any residual GI symptoms. Has stopped his Lisinopril as directed. Is taking Metformin twice daily as directed. Notes glucose averaging in the upper 100s. Denies polyuria, polydipsia or polyphagia. Denies podiatric symptoms. Has been staying well-hydrated and eating a well-balanced diet.    Past Medical History:  Diagnosis Date  . Asthma   . Diabetes mellitus without complication ([place])   . History of chicken pox   . History of DVT (deep vein thrombosis)    s/p trauma of R leg -- 2014. No other history of DVT  . History of kidney stones   . Hypertension   . Lazy eye of left side     Current Outpatient Medications on File Prior to Visit  Medication Sig Dispense Refill  . albuterol (PROVENTIL HFA;VENTOLIN HFA) 108 (90 Base) MCG/ACT inhaler Inhale 2 puffs into the lungs every 6 (six) hours as needed for wheezing or shortness of breath. 1 Inhaler 0  . aspirin 81 MG chewable tablet Chew 81 mg by mouth daily.    Marland Kitchen lisinopril (PRINIVIL,ZESTRIL) 5 MG tablet TAKE 1 TABLET (5 MG TOTAL) BY MOUTH DAILY. 30 tablet 3  . metFORMIN (GLUCOPHAGE) 1000 MG tablet TAKE 1 TABLET BY MOUTH TWICE A DAY WITH A MEAL 60 tablet 5  . Multiple Vitamins-Minerals (MENS MULTIVITAMIN PLUS) TABS Take by mouth daily.    Marland Kitchen atorvastatin (LIPITOR) 10 MG tablet TAKE 1 TABLET EVERY DAY (Patient not taking: Reported on 04/25/2017) 30 tablet 5  . blood glucose meter kit and supplies KIT  Dispense based on patient and insurance preference. Use up to four times daily as directed. (FOR ICD-9 250.00, 250.01). (Patient not taking: Reported on 05/09/2017) 1 each 0   No current facility-administered medications on file prior to visit.     No Known Allergies  Family History  Problem Relation Age of Onset  . Heart disease Mother 43  . Arthritis Mother   . Heart disease Father 4  . Stroke Father   . Healthy Brother   . Heart disease Maternal Grandmother   . Diabetes Maternal Uncle     Social History   Socioeconomic History  . Marital status: Single    Spouse name: None  . Number of children: None  . Years of education: None  . Highest education level: None  Social Needs  . Financial resource strain: None  . Food insecurity - worry: None  . Food insecurity - inability: None  . Transportation needs - medical: None  . Transportation needs - non-medical: None  Occupational History  . Occupation: [employer]    Comment: [employer]   Tobacco Use  . Smoking status: Never Smoker  . Smokeless tobacco: Never Used  Substance and Sexual Activity  . Alcohol use: No  . Drug use: No  . Sexual activity: None  Other Topics Concern  . None  Social History Narrative  . None   Review of Systems - See HPI.  All other ROS are negative.  BP (!) 170/98   Pulse  90   Temp 97.8 F (36.6 C) (Oral)   Resp 16   Ht 5' 10"  (1.778 m)   Wt 178 lb (80.7 kg)   SpO2 98%   BMI 25.54 kg/m   Physical Exam  Constitutional: He is oriented to person, place, and time and well-developed, well-nourished, and in no distress.  HENT:  Head: Normocephalic and atraumatic.  Eyes: Conjunctivae are normal.  Neck: Neck supple. Thyromegaly (without palpable nodularity) present.  Cardiovascular: Normal rate, regular rhythm, normal heart sounds and intact distal pulses.  Pulmonary/Chest: Effort normal and breath sounds normal. No respiratory distress. He has no wheezes. He has no rales. He  exhibits no tenderness.  Abdominal: Soft. Bowel sounds are normal. He exhibits no distension and no mass. There is no tenderness. There is no rebound and no guarding.  Lymphadenopathy:    He has no cervical adenopathy.  Neurological: He is alert and oriented to person, place, and time.  Skin: Skin is warm and dry. No rash noted.  Psychiatric: Affect normal.  Vitals reviewed.  Recent Results (from the past 2160 hour(s))  HM DIABETES EYE EXAM     Status: None   Collection Time: 02/22/17 12:00 AM  Result Value Ref Range   HM Diabetic Eye Exam No Retinopathy No Retinopathy  Comprehensive metabolic panel     Status: Abnormal   Collection Time: 04/22/17  1:44 PM  Result Value Ref Range   Sodium 132 (L) 135 - 145 mmol/L   Potassium 3.9 3.5 - 5.1 mmol/L   Chloride 102 101 - 111 mmol/L   CO2 20 (L) 22 - 32 mmol/L   Glucose, Bld 335 (H) 65 - 99 mg/dL   BUN 10 6 - 20 mg/dL   Creatinine, Ser 0.89 0.61 - 1.24 mg/dL   Calcium 8.6 (L) 8.9 - 10.3 mg/dL   Total Protein 7.4 6.5 - 8.1 g/dL   Albumin 3.2 (L) 3.5 - 5.0 g/dL   AST 40 15 - 41 U/L   ALT 27 17 - 63 U/L   Alkaline Phosphatase 68 38 - 126 U/L   Total Bilirubin 1.0 0.3 - 1.2 mg/dL   GFR calc non Af Amer >60 >60 mL/min   GFR calc Af Amer >60 >60 mL/min    Comment: (NOTE) The eGFR has been calculated using the CKD EPI equation. This calculation has not been validated in all clinical situations. eGFR's persistently <60 mL/min signify possible Chronic Kidney Disease.    Anion gap 10 5 - 15  CBC with Differential     Status: Abnormal   Collection Time: 04/22/17  1:44 PM  Result Value Ref Range   WBC 6.3 4.0 - 10.5 K/uL   RBC 4.36 4.22 - 5.81 MIL/uL   Hemoglobin 13.5 13.0 - 17.0 g/dL   HCT 38.6 (L) 39.0 - 52.0 %   MCV 88.5 78.0 - 100.0 fL   MCH 31.0 26.0 - 34.0 pg   MCHC 35.0 30.0 - 36.0 g/dL   RDW 12.4 11.5 - 15.5 %   Platelets 185 150 - 400 K/uL   Neutrophils Relative % 55 %   Lymphocytes Relative 35 %   Monocytes Relative 9 %     Eosinophils Relative 0 %   Basophils Relative 1 %   Neutro Abs 3.4 1.7 - 7.7 K/uL   Lymphs Abs 2.2 0.7 - 4.0 K/uL   Monocytes Absolute 0.6 0.1 - 1.0 K/uL   Eosinophils Absolute 0.0 0.0 - 0.7 K/uL   Basophils Absolute 0.1 0.0 - 0.1  K/uL   WBC Morphology MILD LEFT SHIFT (1-5% METAS, OCC MYELO, OCC BANDS)     Comment: ATYPICAL LYMPHOCYTES  Urinalysis, Routine w reflex microscopic     Status: Abnormal   Collection Time: 04/22/17  1:53 PM  Result Value Ref Range   Color, Urine YELLOW YELLOW   APPearance HAZY (A) CLEAR   Specific Gravity, Urine 1.025 1.005 - 1.030   pH 5.0 5.0 - 8.0   Glucose, UA >=500 (A) NEGATIVE mg/dL   Hgb urine dipstick SMALL (A) NEGATIVE   Bilirubin Urine NEGATIVE NEGATIVE   Ketones, ur 20 (A) NEGATIVE mg/dL   Protein, ur >=300 (A) NEGATIVE mg/dL   Nitrite NEGATIVE NEGATIVE   Leukocytes, UA NEGATIVE NEGATIVE   RBC / HPF 0-5 0 - 5 RBC/hpf   WBC, UA 0-5 0 - 5 WBC/hpf   Bacteria, UA NONE SEEN NONE SEEN   Squamous Epithelial / LPF 0-5 (A) NONE SEEN   Mucus PRESENT   I-Stat CG4 Lactic Acid, ED     Status: None   Collection Time: 04/22/17  1:58 PM  Result Value Ref Range   Lactic Acid, Venous 1.19 0.5 - 1.9 mmol/L  POCT CBG (Fasting - Glucose)     Status: Abnormal   Collection Time: 04/25/17 12:11 PM  Result Value Ref Range   Glucose Fasting, POC 430 (A) 70 - 99 mg/dL  POCT HgB A1C     Status: Abnormal   Collection Time: 04/25/17 12:11 PM  Result Value Ref Range   Hemoglobin A1C 13.3   CBG monitoring, ED     Status: Abnormal   Collection Time: 04/25/17  1:06 PM  Result Value Ref Range   Glucose-Capillary 383 (H) 65 - 99 mg/dL  CBC with Differential     Status: Abnormal   Collection Time: 04/25/17  1:08 PM  Result Value Ref Range   WBC 9.4 4.0 - 10.5 K/uL   RBC 4.90 4.22 - 5.81 MIL/uL   Hemoglobin 15.0 13.0 - 17.0 g/dL   HCT 43.7 39.0 - 52.0 %   MCV 89.2 78.0 - 100.0 fL   MCH 30.6 26.0 - 34.0 pg   MCHC 34.3 30.0 - 36.0 g/dL   RDW 12.3 11.5 - 15.5 %    Platelets 310 150 - 400 K/uL   Neutrophils Relative % 66 %   Lymphocytes Relative 22 %   Monocytes Relative 12 %   Eosinophils Relative 0 %   Basophils Relative 0 %   Neutro Abs 6.2 1.7 - 7.7 K/uL   Lymphs Abs 2.1 0.7 - 4.0 K/uL   Monocytes Absolute 1.1 (H) 0.1 - 1.0 K/uL   Eosinophils Absolute 0.0 0.0 - 0.7 K/uL   Basophils Absolute 0.0 0.0 - 0.1 K/uL   Smear Review MORPHOLOGY UNREMARKABLE     Comment: Performed at [facility], [address] [place], [place] [address]  Comprehensive metabolic panel     Status: Abnormal   Collection Time: 04/25/17  1:08 PM  Result Value Ref Range   Sodium 135 135 - 145 mmol/L   Potassium 4.5 3.5 - 5.1 mmol/L   Chloride 99 (L) 101 - 111 mmol/L   CO2 26 22 - 32 mmol/L   Glucose, Bld 398 (H) 65 - 99 mg/dL   BUN 12 6 - 20 mg/dL   Creatinine, Ser 1.26 (H) 0.61 - 1.24 mg/dL   Calcium 9.0 8.9 - 10.3 mg/dL   Total Protein 7.8 6.5 - 8.1 g/dL   Albumin 2.7 (L) 3.5 -  5.0 g/dL   AST 33 15 - 41 U/L   ALT 22 17 - 63 U/L   Alkaline Phosphatase 82 38 - 126 U/L   Total Bilirubin 0.8 0.3 - 1.2 mg/dL   GFR calc non Af Amer >60 >60 mL/min   GFR calc Af Amer >60 >60 mL/min    Comment: (NOTE) The eGFR has been calculated using the CKD EPI equation. This calculation has not been validated in all clinical situations. eGFR's persistently <60 mL/min signify possible Chronic Kidney Disease.    Anion gap 10 5 - 15    Comment: Performed at [facility], [address] [place], [place] [address]  Lipase, blood     Status: None   Collection Time: 04/25/17  1:08 PM  Result Value Ref Range   Lipase 39 11 - 51 U/L    Comment: Performed at [facility], [address] [place], [place] [address]  POC CBG, ED     Status: Abnormal   Collection Time: 04/25/17  2:56 PM  Result Value Ref Range   Glucose-Capillary 277 (H) 65 - 99 mg/dL  Comp Met (CMET)     Status: Abnormal   Collection Time: 05/09/17  3:34 PM  Result Value Ref Range    Glucose, Bld 229 (H) 65 - 99 mg/dL    Comment: .            Fasting reference interval . For someone without known diabetes, a glucose value >125 mg/dL indicates that they may have diabetes and this should be confirmed with a follow-up test. .    BUN 14 7 - 25 mg/dL   Creat 1.24 0.70 - 1.33 mg/dL    Comment: For patients >29 years of age, the reference limit for Creatinine is approximately 13% higher for people identified as African-American. .    BUN/Creatinine Ratio NOT APPLICABLE 6 - 22 (calc)   Sodium 140 135 - 146 mmol/L   Potassium 3.8 3.5 - 5.3 mmol/L   Chloride 105 98 - 110 mmol/L   CO2 24 20 - 32 mmol/L   Calcium 8.9 8.6 - 10.3 mg/dL   Total Protein 6.8 6.1 - 8.1 g/dL   Albumin 3.5 (L) 3.6 - 5.1 g/dL   Globulin 3.3 1.9 - 3.7 g/dL (calc)   AG Ratio 1.1 1.0 - 2.5 (calc)   Total Bilirubin 0.5 0.2 - 1.2 mg/dL   Alkaline phosphatase (APISO) 67 40 - 115 U/L   AST 15 10 - 35 U/L   ALT 14 9 - 46 U/L  Lipid panel     Status: Abnormal   Collection Time: 05/09/17  3:34 PM  Result Value Ref Range   Cholesterol 245 (H) <200 mg/dL   HDL 49 >40 mg/dL   Triglycerides 89 <150 mg/dL   LDL Cholesterol (Calc) 176 (H) mg/dL (calc)    Comment: Reference range: <100 . Desirable range <100 mg/dL for primary prevention;   <70 mg/dL for patients with CHD or diabetic patients  with > or = 2 CHD risk factors. Marland Kitchen LDL-C is now calculated using the Terris Germano-Hopkins  calculation, which is a validated novel method providing  better accuracy than the Friedewald equation in the  estimation of LDL-C.  Cresenciano Genre et al. Annamaria Helling. 8546;270(35): 2061-2068  (http://education.[facility].com/faq/FAQ164)    Total CHOL/HDL Ratio 5.0 (H) <5.0 (calc)   Non-HDL Cholesterol (Calc) 196 (H) <130 mg/dL (calc)    Comment: For patients with diabetes plus 1 major ASCVD risk  factor,  treating to a non-HDL-C goal of <100 mg/dL  (LDL-C of <70 mg/dL) is considered a therapeutic  option.   T4, free      Status: None   Collection Time: 05/09/17  3:34 PM  Result Value Ref Range   Free T4 1.1 0.8 - 1.8 ng/dL  TSH     Status: None   Collection Time: 05/09/17  3:34 PM  Result Value Ref Range   TSH 0.82 0.40 - 4.50 mIU/L  EXTRA LAV TOP TUBE     Status: None   Collection Time: 05/09/17  3:34 PM  Result Value Ref Range   EXTRA LAVENDER-TOP TUBE      Comment: We received an extra specimen with no test requested. If any test is desired for this specimen please call client services and advise.    Assessment/Plan: 1. Type 2 diabetes mellitus without complication, without long-term current use of insulin (HCC) Glucose levels much improved. Will reassess CMP today and increase Metformin if renal function improved. Supplies refilled. - POCT CBG (Fasting - Glucose) - glucose blood test strip; Use as instructed, Check blood sugars twice daily  Dispense: 100 each; Refill: 12 - Lancets (ONETOUCH ULTRASOFT) lancets; Use as instructed, Check blood sugars twice daily  Dispense: 100 each; Refill: 12 - Comp Met (CMET) - Lipid panel - T4, free - TSH  2. Essential hypertension Above goal off of the lisinopril. Will start amlodipine 5 mg. Check CMP today and restart lisinopril as well if labs are improved. Close follow-up scheduled. - amLODipine (NORVASC) 5 MG tablet; Take 1 tablet (5 mg total) by mouth daily.  Dispense: 30 tablet; Refill: 1 - Comp Met (CMET) - Lipid panel - T4, free - TSH  3. Hyperlipidemia LDL goal <100 - Comp Met (CMET) - Lipid panel   4. Thyromegaly Noted on examination. Will check thyroid function and obtain US of the thyroid to further assess.  - US Soft Tissue Head/Neck; Future - Comp Met (CMET) - Lipid panel - T4, free - TSH  5. Acute renal insufficiency Is hydrating well. Will repeat CMP today and restart lisinopril if appropriate.  - Comp Met (CMET) - Lipid panel 6. Colon cancer screening Due. Average risk. Asymptomatic. Referral to Gastroenterology placed. -  Ambulatory referral to Gastroenterology    Leeanne Rio, PA-C

## 2017-05-14 ENCOUNTER — Telehealth: Payer: Self-pay | Admitting: Physician Assistant

## 2017-05-14 NOTE — Telephone Encounter (Signed)
Results given and documented in result note. 

## 2017-05-14 NOTE — Telephone Encounter (Signed)
Copied from CRM [id number]. Topic: Quick Communication - Lab Results >> May 14, 2017  5:57 PM Stephannie Li, [place] wrote: Patient called and would like lab results  [phone]

## 2017-05-15 ENCOUNTER — Other Ambulatory Visit: Payer: Self-pay | Admitting: Physician Assistant

## 2017-05-15 DIAGNOSIS — E785 Hyperlipidemia, unspecified: Secondary | ICD-10-CM

## 2017-05-15 MED ORDER — ATORVASTATIN CALCIUM 10 MG PO TABS: 10.0000 mg | ORAL_TABLET | Freq: Every day | ORAL | 5 refills | Status: DC

## 2017-05-23 ENCOUNTER — Encounter: Payer: Self-pay | Admitting: Emergency Medicine

## 2017-05-23 ENCOUNTER — Ambulatory Visit: Payer: BLUE CROSS/BLUE SHIELD | Admitting: Physician Assistant

## 2017-06-09 ENCOUNTER — Telehealth: Payer: Self-pay | Admitting: *Deleted

## 2017-06-09 NOTE — Telephone Encounter (Signed)
OK w me.  

## 2017-06-09 NOTE — Telephone Encounter (Signed)
Copied from CRM [id number]. Topic: Appointment Scheduling - Scheduling Inquiry for Clinic >> Jun 09, 2017  2:39 PM Oneal Grout wrote: Reason for CRM: Would like to switch providers from Malva Cogan to someone in [facility], Dr Carmelia Roller, insurance will only pay for MD. Please advise

## 2017-06-09 NOTE — Telephone Encounter (Signed)
Ok with me 

## 2017-06-10 NOTE — Telephone Encounter (Signed)
LVM on cell phone for pt to schedule a transfer pt appt that was approved by Dr Carmelia Roller.

## 2017-06-16 ENCOUNTER — Ambulatory Visit: Payer: PRIVATE HEALTH INSURANCE | Admitting: Family Medicine

## 2017-06-16 ENCOUNTER — Encounter: Payer: Self-pay | Admitting: Family Medicine

## 2017-06-16 VITALS — BP 170/120 | HR 100 | Temp 98.2°F | Ht 70.5 in | Wt 178.5 lb

## 2017-06-16 DIAGNOSIS — I10 Essential (primary) hypertension: Secondary | ICD-10-CM

## 2017-06-16 DIAGNOSIS — E119 Type 2 diabetes mellitus without complications: Secondary | ICD-10-CM | POA: Diagnosis not present

## 2017-06-16 MED ORDER — METFORMIN HCL 1000 MG PO TABS: 1000.0000 mg | ORAL_TABLET | Freq: Two times a day (BID) | ORAL | 3 refills | Status: DC

## 2017-06-16 MED ORDER — EMPAGLIFLOZIN 10 MG PO TABS: 10.0000 mg | ORAL_TABLET | Freq: Every day | ORAL | 2 refills | Status: DC

## 2017-06-16 MED ORDER — LISINOPRIL 10 MG PO TABS: 10.0000 mg | ORAL_TABLET | Freq: Every day | ORAL | 3 refills | Status: DC

## 2017-06-16 MED ORDER — ATORVASTATIN CALCIUM 20 MG PO TABS: 20.0000 mg | ORAL_TABLET | Freq: Every day | ORAL | 0 refills | Status: DC

## 2017-06-16 MED ORDER — AMLODIPINE BESYLATE 5 MG PO TABS: 5.0000 mg | ORAL_TABLET | Freq: Every day | ORAL | 3 refills | Status: DC

## 2017-06-16 NOTE — Progress Notes (Signed)
Chief Complaint  Patient presents with  . Establish Care    transfer from  [place] is a 52 y.o. male who presents for hypertension follow up. He does not monitor home blood pressures. He is compliant with medications normally.  He went to a funeral over the past couple days and did not take any of his medicine. Patient has these side effects of medication: none He is adhering to a healthy diet overall. Current exercise: physically active at work  Hx of DM, does not check sugars because the lancets do not work. PCV23 in 2017. Last eye exam was a couple months ago.  His last A1c was 13.3 on 2/1, at that time his previous PCP increase his dose of metformin from 500 mg twice daily to 1000 mg twice daily.  He tolerated this increase well.  He is not on insulin or any other medications.  As noted above, his diet is healthy overall and is physically active at work.  He does admit that he probably needs to exercise more.  He has received a pneumonia vaccine.  He is on Lipitor 10 mg daily.  Past Medical History:  Diagnosis Date  . Asthma   . Diabetes mellitus without complication ([place])   . History of chicken pox   . History of DVT (deep vein thrombosis)    s/p trauma of R leg -- 2014. No other history of DVT  . History of kidney stones   . Hypertension   . Lazy eye of left side    Family History  Problem Relation Age of Onset  . Heart disease Mother 11  . Arthritis Mother   . Heart disease Father 45  . Stroke Father   . Healthy Brother   . Heart disease Maternal Grandmother   . Diabetes Maternal Uncle     Medications Current Outpatient Medications on File Prior to Visit  Medication Sig Dispense Refill  . albuterol (PROVENTIL HFA;VENTOLIN HFA) 108 (90 Base) MCG/ACT inhaler Inhale 2 puffs into the lungs every 6 (six) hours as needed for wheezing or shortness of breath. 1 Inhaler 0  . amLODipine (NORVASC) 5 MG tablet Take 1 tablet (5 mg total) by mouth  daily. 30 tablet 1  . aspirin 81 MG chewable tablet Chew 81 mg by mouth daily.    Marland Kitchen atorvastatin (LIPITOR) 10 MG tablet Take 1 tablet (10 mg total) by mouth daily. 30 tablet 5  . blood glucose meter kit and supplies KIT Dispense based on patient and insurance preference. Use up to four times daily as directed. (FOR ICD-9 250.00, 250.01). 1 each 0  . glucose blood test strip Use as instructed, Check blood sugars twice daily 100 each 12  . Lancets (ONETOUCH ULTRASOFT) lancets Use as instructed, Check blood sugars twice daily 100 each 12  . metFORMIN (GLUCOPHAGE) 1000 MG tablet TAKE 1 TABLET BY MOUTH TWICE A DAY WITH A MEAL 60 tablet 5  . Multiple Vitamins-Minerals (MENS MULTIVITAMIN PLUS) TABS Take by mouth daily.    Marland Kitchen lisinopril (PRINIVIL,ZESTRIL) 5 MG tablet TAKE 1 TABLET (5 MG TOTAL) BY MOUTH DAILY. (Patient not taking: Reported on 06/16/2017) 30 tablet 3    Allergies No Known Allergies  Review of Systems Cardiovascular: no chest pain Respiratory:  no shortness of breath  Exam BP (!) 170/120 (BP Location: Left Arm, Patient Position: Sitting, Cuff Size: Normal)   Pulse 100   Temp 98.2 F (36.8 C) (Oral)   Ht 5' 10.5" (  1.791 m)   Wt 178 lb 8 oz (81 kg)   SpO2 97%   BMI 25.25 kg/m  General:  well developed, well nourished, in no apparent distress Skin: warm, no pallor or diaphoresis Eyes: pupils equal and round, sclera anicteric without injection Heart: RRR, no bruits, no LE edema Lungs: clear to auscultation, no accessory muscle use Skin: dry skin and medial great toe callus noted, no ulcers or fissures of feet Neuro: Sensation intact to pinprick Psych: well oriented with normal range of affect and appropriate judgment/insight  Type 2 diabetes mellitus without complication, without long-term current use of insulin (HCC) - Plan: metFORMIN (GLUCOPHAGE) 1000 MG tablet, atorvastatin (LIPITOR) 20 MG tablet, empagliflozin (JARDIANCE) 10 MG TABS tablet, Microalbumin / creatinine urine  ratio  Essential hypertension - Plan: lisinopril (PRINIVIL,ZESTRIL) 10 MG tablet, amLODipine (NORVASC) 5 MG tablet  Orders as above.  Add Jardiance, increase dose of Lipitor, check microalbumin/creatinine ratio.  Payment card for Jardiance given, if too expensive he will let us know.  I will likely try Actos at that time. Counseled on diet and exercise. Add back lisinopril, discussed returning in 2 weeks for a nurse visit versus checking blood pressures at home. F/u in 6 weeks. He is going to check BP's over next couple weeks The patient voiced understanding and agreement to the plan.  [place], DO 06/16/17  4:40 PM

## 2017-06-16 NOTE — Progress Notes (Signed)
Pre visit review using our clinic review tool, if applicable. No additional management support is needed unless otherwise documented below in the visit note. 

## 2017-06-16 NOTE — Patient Instructions (Signed)
Check blood pressures at home. If they do not come back down, let us know.  Keep the diet clean.  Stay active. Try to add some walking to your routine.  Take 2 tabs of Lipitor until you run out. A new dose has been called in.   Do not fill medicine if too expensive. Let me know though.   Let us know if you need anything.

## 2017-06-17 LAB — MICROALBUMIN / CREATININE URINE RATIO
Creatinine,U: 165.5 mg/dL
Microalb Creat Ratio: 52.6 mg/g — ABNORMAL HIGH (ref 0.0–30.0)
Microalb, Ur: 87 mg/dL — ABNORMAL HIGH (ref 0.0–1.9)

## 2017-07-28 ENCOUNTER — Ambulatory Visit: Payer: PRIVATE HEALTH INSURANCE | Admitting: Family Medicine

## 2017-07-28 DIAGNOSIS — Z0289 Encounter for other administrative examinations: Secondary | ICD-10-CM

## 2017-07-29 ENCOUNTER — Encounter: Payer: Self-pay | Admitting: Family Medicine

## 2019-01-27 ENCOUNTER — Ambulatory Visit
Admission: EM | Admit: 2019-01-27 | Discharge: 2019-01-27 | Disposition: A | Discharge status: Home / Self Care | Payer: BC Managed Care – PPO | Attending: Physician Assistant | Admitting: Physician Assistant

## 2019-01-27 DIAGNOSIS — H6123 Impacted cerumen, bilateral: Secondary | ICD-10-CM

## 2019-01-27 NOTE — Discharge Instructions (Addendum)
Unable to remove all the ear wax due to hardness of the ear wax. Continue ear drop both ears for 7 days, and we can recheck. You can use mixture of hydrogen peroxide and water for warm mixture to flush your ears. Follow up in 7 days for further evaluation if still not improving.

## 2019-01-27 NOTE — ED Triage Notes (Signed)
Pt c/o lt ear fullness and pain x3 days

## 2019-01-27 NOTE — ED Provider Notes (Signed)
[facility]    CSN: [id number] Arrival date & time: 01/27/19  5809      History   Chief Complaint Chief Complaint  Patient presents with  . Otalgia    HPI John [name] is a 53 y.o. male.   53 year old male comes in for 3-day history of left ear fullness and pain.  States area is pounding in sensation.  Has muffled hearing.  Denies ear drainage.  Denies URI symptoms such as cough, congestion, sore throat.  Denies fever, chills, body aches.  Has tried over-the-counter eardrops and cotton swab without relief.  Patient states last A1c about 8 earlier this year, has not followed up for recheck for few months.     Past Medical History:  Diagnosis Date  . Asthma   . Diabetes mellitus without complication ([place])   . History of chicken pox   . History of DVT (deep vein thrombosis)    s/p trauma of R leg -- 2014. No other history of DVT  . History of kidney stones   . Hypertension   . Lazy eye of left side     Patient Active Problem List   Diagnosis Date Noted  . Hyperlipidemia LDL goal <100 12/06/2015  . Hypertension 04/06/2012  . DM type 2 (diabetes mellitus, type 2) ([place]) 03/20/2012  . History of DVT (deep vein thrombosis) 03/19/2012    Past Surgical History:  Procedure Laterality Date  . EYE SURGERY         Home Medications    Prior to Admission medications   Medication Sig Start Date End Date Taking? Authorizing Provider  albuterol (PROVENTIL HFA;VENTOLIN HFA) 108 (90 Base) MCG/ACT inhaler Inhale 2 puffs into the lungs every 6 (six) hours as needed for wheezing or shortness of breath. 01/24/16   [name], PA-C  amLODipine (NORVASC) 5 MG tablet Take 1 tablet (5 mg total) by mouth daily. 06/16/17   [name], DO  aspirin 81 MG chewable tablet Chew 81 mg by mouth daily.    [provider]  atorvastatin (LIPITOR) 20 MG tablet Take 1 tablet (20 mg total) by mouth daily. 06/16/17   [name], DO  blood glucose  meter kit and supplies KIT Dispense based on patient and insurance preference. Use up to four times daily as directed. (FOR ICD-9 250.00, 250.01). 10/18/15   Raiford Noble C, PA-C  glucose blood test strip Use as instructed, Check blood sugars twice daily 05/09/17   [name], PA-C  Lancets [facility] ULTRASOFT) lancets Use as instructed, Check blood sugars twice daily 05/09/17   [name], PA-C  lisinopril (PRINIVIL,ZESTRIL) 10 MG tablet Take 1 tablet (10 mg total) by mouth daily. 06/16/17   [name], DO  metFORMIN (GLUCOPHAGE) 1000 MG tablet Take 1 tablet (1,000 mg total) by mouth 2 (two) times daily with a meal. 06/16/17   Wendling, [name], DO  Multiple Vitamins-Minerals (MENS MULTIVITAMIN PLUS) TABS Take by mouth daily.    [provider]    Family History Family History  Problem Relation Age of Onset  . Heart disease Mother 65  . Arthritis Mother   . Heart disease Father 68  . Stroke Father   . Healthy Brother   . Heart disease Maternal Grandmother   . Diabetes Maternal Uncle     Social History Social History   Tobacco Use  . Smoking status: Never Smoker  . Smokeless tobacco: Never Used  Substance Use Topics  . Alcohol use: No  .  Drug use: No     Allergies   Patient has no known allergies.   Review of Systems Review of Systems  Reason unable to perform ROS: See HPI as above.     Physical Exam Triage Vital Signs ED Triage Vitals [01/27/19 0831]  Enc Vitals Group     BP (!) 168/98     Pulse Rate 91     Resp 18     Temp 97.9 F (36.6 C)     Temp Source Oral     SpO2 98 %     Weight      Height      Head Circumference      Peak Flow      Pain Score 2     Pain Loc      Pain Edu?      Excl. in [place]?    No data found.  Updated Vital Signs BP (!) 168/98 (BP Location: Left Arm)   Pulse 91   Temp 97.9 F (36.6 C) (Oral)   Resp 18   SpO2 98%   Physical Exam Constitutional:      General: He is not in acute  distress.    Appearance: He is well-developed. He is not diaphoretic.  HENT:     Head: Normocephalic and atraumatic.     Right Ear: External ear normal.     Left Ear: External ear normal.     Ears:     Comments: No tenderness to palpation of bilateral tragus.  Bilateral cerumen impaction, TM not visible.  Ear irrigation discontinued by patient due to pain.  Bilateral cerumen impaction still present, hard cerumen. TM not visible. Eyes:     Conjunctiva/sclera: Conjunctivae normal.     Pupils: Pupils are equal, round, and reactive to light.  Pulmonary:     Effort: Pulmonary effort is normal. No respiratory distress.  Neurological:     Mental Status: He is alert and oriented to person, place, and time.      UC Treatments / Results  Labs (all labs ordered are listed, but only abnormal results are displayed) Labs Reviewed - No data to display  EKG   Radiology No results found.  Procedures Procedures (including critical care time)  Medications Ordered in UC Medications - No data to display  Initial Impression / Assessment and Plan / UC Course  I have reviewed the triage vital signs and the nursing notes.  Pertinent labs & imaging results that were available during my care of the patient were reviewed by me and considered in my medical decision making (see chart for details).    Ear irrigation discontinued by patient due to pain.  Bilateral cerumen hard, TM not visible.  Will have patient use Debrox/mineral oil-year-old for 7 days and recheck for irrigation.  Discussed trying patient peroxide at home to see if this relieves symptoms.  Return precautions given.  Patient expresses understanding and agrees to plan.  Final Clinical Impressions(s) / UC Diagnoses   Final diagnoses:  Bilateral impacted cerumen   ED Prescriptions    None     PDMP not reviewed this encounter.   Ok Edwards, PA-C 01/27/19 [id number]

## 2019-05-28 ENCOUNTER — Ambulatory Visit: Payer: BC Managed Care – PPO | Admitting: Family Medicine

## 2019-05-28 ENCOUNTER — Other Ambulatory Visit: Payer: Self-pay

## 2019-05-28 ENCOUNTER — Encounter: Payer: Self-pay | Admitting: Family Medicine

## 2019-05-28 ENCOUNTER — Other Ambulatory Visit: Payer: Self-pay | Admitting: Family Medicine

## 2019-05-28 VITALS — BP 170/118 | HR 109 | Temp 96.6°F | Ht 70.0 in | Wt 170.2 lb

## 2019-05-28 DIAGNOSIS — I10 Essential (primary) hypertension: Secondary | ICD-10-CM

## 2019-05-28 DIAGNOSIS — L84 Corns and callosities: Secondary | ICD-10-CM | POA: Diagnosis not present

## 2019-05-28 DIAGNOSIS — E119 Type 2 diabetes mellitus without complications: Secondary | ICD-10-CM | POA: Diagnosis not present

## 2019-05-28 LAB — COMPREHENSIVE METABOLIC PANEL
ALT: 22 U/L (ref 0–53)
AST: 17 U/L (ref 0–37)
Albumin: 4.4 g/dL (ref 3.5–5.2)
Alkaline Phosphatase: 97 U/L (ref 39–117)
BUN: 15 mg/dL (ref 6–23)
CO2: 27 mEq/L (ref 19–32)
Calcium: 10.2 mg/dL (ref 8.4–10.5)
Chloride: 101 mEq/L (ref 96–112)
Creatinine, Ser: 1.26 mg/dL (ref 0.40–1.50)
GFR: 72.4 mL/min (ref >60.00)
Glucose, Bld: 340 mg/dL — ABNORMAL HIGH (ref 70–99)
Potassium: 4.2 mEq/L (ref 3.5–5.1)
Sodium: 137 mEq/L (ref 135–145)
Total Bilirubin: 1.2 mg/dL (ref 0.2–1.2)
Total Protein: 8.3 g/dL (ref 6.0–8.3)

## 2019-05-28 LAB — LIPID PANEL
Cholesterol: 210 mg/dL — ABNORMAL HIGH (ref 0–200)
HDL: 47.1 mg/dL (ref >39.00)
LDL Cholesterol: 146 mg/dL — ABNORMAL HIGH (ref 0–99)
NonHDL: 162.59
Total CHOL/HDL Ratio: 4
Triglycerides: 83 mg/dL (ref 0.0–149.0)
VLDL: 16.6 mg/dL (ref 0.0–40.0)

## 2019-05-28 LAB — HEMOGLOBIN A1C: Hgb A1c MFr Bld: 15.3 % — ABNORMAL HIGH (ref 4.6–6.5)

## 2019-05-28 LAB — MICROALBUMIN / CREATININE URINE RATIO
Creatinine,U: 125.9 mg/dL
Microalb Creat Ratio: 22.6 mg/g (ref 0.0–30.0)
Microalb, Ur: 28.5 mg/dL — ABNORMAL HIGH (ref 0.0–1.9)

## 2019-05-28 MED ORDER — FREESTYLE LIBRE SENSOR SYSTEM MISC: 5 refills | Status: DC

## 2019-05-28 MED ORDER — LISINOPRIL 20 MG PO TABS: 20.0000 mg | ORAL_TABLET | Freq: Every day | ORAL | 2 refills | Status: DC

## 2019-05-28 MED ORDER — METFORMIN HCL ER 500 MG PO TB24: 1000.0000 mg | ORAL_TABLET | Freq: Every day | ORAL | 2 refills | Status: DC

## 2019-05-28 MED ORDER — GLYBURIDE 5 MG PO TABS: 5.0000 mg | ORAL_TABLET | Freq: Two times a day (BID) | ORAL | 2 refills | Status: DC

## 2019-05-28 MED ORDER — FREESTYLE LIBRE 14 DAY READER DEVI: 1.0000 | 4 refills | Status: DC

## 2019-05-28 MED ORDER — AMLODIPINE BESYLATE 5 MG PO TABS: 5.0000 mg | ORAL_TABLET | Freq: Every day | ORAL | 3 refills | Status: DC

## 2019-05-28 MED ORDER — ATORVASTATIN CALCIUM 20 MG PO TABS: 20.0000 mg | ORAL_TABLET | Freq: Every day | ORAL | 3 refills | Status: DC

## 2019-05-28 NOTE — Progress Notes (Signed)
Subjective:   Chief Complaint  Patient presents with  . Follow-up    Blood sugar problems    Zubair Lofton is a 54 y.o. male here for follow-up of diabetes.   [place] does not monitor his home sugars. Reports he cannot draw blood when he uses his lancets. Patient does not require insulin.   Medications include: Metformin 1 g bid; gets GI side effects Exercise: Active at work  Hypertension Patient presents for hypertension follow up. He does not monitor home blood pressures. He is compliant with medication- lisinopril 10 mg/d. Patient has these side effects of medication: none Diet/exercise as above.   Past Medical History:  Diagnosis Date  . Asthma   . Diabetes mellitus without complication (HCC)   . History of chicken pox   . History of DVT (deep vein thrombosis)    s/p trauma of R leg -- 2014. No other history of DVT  . History of kidney stones   . Hypertension   . Lazy eye of left side      Related testing: Date of retinal exam: Done Pneumovax: done Flu Shot: done  Review of Systems: Pulmonary:  No SOB Cardiovascular:  No chest pain  Objective:  BP (!) 170/118 (BP Location: Left Arm, Patient Position: Sitting, Cuff Size: Normal)   Pulse (!) 109   Temp (!) 96.6 F (35.9 C) (Temporal)   Ht 5\' 10"  (1.778 m)   Wt 170 lb 4 oz (77.2 kg)   SpO2 97%   BMI 24.43 kg/m  General:  Well developed, well nourished, in no apparent distress Skin:  Large medial callous on feet Head:  Normocephalic, atraumatic Eyes:  Pupils equal and round, sclera anicteric without injection  Lungs:  CTAB, no access msc use Cardio:  RRR, no bruits, no LE edema Psych: Age appropriate judgment and insight  Assessment:   Type 2 diabetes mellitus without complication, without long-term current use of insulin (HCC) - Plan: atorvastatin (LIPITOR) 20 MG tablet, Microalbumin / creatinine urine ratio, Comprehensive metabolic panel, Hemoglobin A1c, Lipid panel, Ambulatory referral to  Podiatry  Essential hypertension - Plan: amLODipine (NORVASC) 5 MG tablet  Pre-ulcerative corn or callous - Plan: Ambulatory referral to Podiatry   Plan:   1- Cont statin. Change metformin to XR. Counseled on diet and exercise. 2- Increase lisinopril to 20 mg/d. Add back Norvasc. 3- Refer to podiatry.  F/u in 1 mo. The patient voiced understanding and agreement to the plan.  [place], DO 05/28/19 11:10 AM

## 2019-05-28 NOTE — Patient Instructions (Addendum)
Keep the diet clean and stay active.  Give [place] 2-3 business days to get the results of your labs back.   The only lifestyle changes that have data behind them are weight loss for the overweight/obese and elevating the head of the bed. Finding out which foods/positions are triggers is important.  Let us know if you need anything.  Healthy Eating Plan Many factors influence your heart health, including eating and exercise habits. Heart (coronary) risk increases with abnormal blood fat (lipid) levels. Heart-healthy meal planning includes limiting unhealthy fats, increasing healthy fats, and making other small dietary changes. This includes maintaining a healthy body weight to help keep lipid levels within a normal range.  WHAT IS MY PLAN?  Your health care provider recommends that you:  Drink a glass of water before meals to help with satiety.  Eat slowly.  An alternative to the water is to add Metamucil. This will help with satiety as well. It does contain calories, unlike water.  WHAT TYPES OF FAT SHOULD I CHOOSE?  Choose healthy fats more often. Choose monounsaturated and polyunsaturated fats, such as olive oil and canola oil, flaxseeds, walnuts, almonds, and seeds.  Eat more omega-3 fats. Good choices include salmon, mackerel, sardines, tuna, flaxseed oil, and ground flaxseeds. Aim to eat fish at least two times each week.  Avoid foods with partially hydrogenated oils in them. These contain trans fats. Examples of foods that contain trans fats are stick margarine, some tub margarines, cookies, crackers, and other baked goods. If you are going to avoid a fat, this is the one to avoid!  WHAT GENERAL GUIDELINES DO I NEED TO FOLLOW?  Check food labels carefully to identify foods with trans fats. Avoid these types of options when possible.  Fill one half of your plate with vegetables and green salads. Eat 4-5 servings of vegetables per day. A serving of vegetables equals 1 cup of raw leafy  vegetables,  cup of raw or cooked cut-up vegetables, or  cup of vegetable juice.  Fill one fourth of your plate with whole grains. Look for the word "whole" as the first word in the ingredient list.  Fill one fourth of your plate with lean protein foods.  Eat 4-5 servings of fruit per day. A serving of fruit equals one medium whole fruit,  cup of dried fruit,  cup of fresh, frozen, or canned fruit. Try to avoid fruits in cups/syrups as the sugar content can be high.  Eat more foods that contain soluble fiber. Examples of foods that contain this type of fiber are apples, broccoli, carrots, beans, peas, and barley. Aim to get 20-30 g of fiber per day.  Eat more home-cooked food and less restaurant, buffet, and fast food.  Limit or avoid alcohol.  Limit foods that are high in starch and sugar.  Avoid fried foods when able.  Cook foods by using methods other than frying. Baking, boiling, grilling, and broiling are all great options. Other fat-reducing suggestions include: ? Removing the skin from poultry. ? Removing all visible fats from meats. ? Skimming the fat off of stews, soups, and gravies before serving them. ? Steaming vegetables in water or broth.  Lose weight if you are overweight. Losing just 5-10% of your initial body weight can help your overall health and prevent diseases such as diabetes and heart disease.  Increase your consumption of nuts, legumes, and seeds to 4-5 servings per week. One serving of dried beans or legumes equals  cup after being cooked,  one serving of nuts equals 1 ounces, and one serving of seeds equals  ounce or 1 tablespoon.  WHAT ARE GOOD FOODS CAN I EAT? Grains Grainy breads (try to find bread that is 3 g of fiber per slice or greater), oatmeal, light popcorn. Whole-grain cereals. Rice and pasta, including brown rice and those that are made with whole wheat. Edamame pasta is a great alternative to grain pasta. It has a higher protein content. Try  to avoid significant consumption of white bread, sugary cereals, or pastries/baked goods.  Vegetables All vegetables. Cooked white potatoes do not count as vegetables.  Fruits All fruits, but limit pineapple and bananas as these fruits have a higher sugar content.  Meats and Other Protein Sources Lean, well-trimmed beef, veal, pork, and lamb. Chicken and [place] without skin. All fish and shellfish. Wild duck, rabbit, pheasant, and venison. Egg whites or low-cholesterol egg substitutes. Dried beans, peas, lentils, and tofu.Seeds and most nuts.  Dairy Low-fat or nonfat cheeses, including ricotta, string, and mozzarella. Skim or 1% milk that is liquid, powdered, or evaporated. Buttermilk that is made with low-fat milk. Nonfat or low-fat yogurt. Soy/Almond milk are good alternatives if you cannot handle dairy.  Beverages Water is the best for you. Sports drinks with less sugar are more desirable unless you are a highly active athlete.  Sweets and Desserts Sherbets and fruit ices. Honey, jam, marmalade, jelly, and syrups. Dark chocolate.  Eat all sweets and desserts in moderation.  Fats and Oils Nonhydrogenated (trans-free) margarines. Vegetable oils, including soybean, sesame, sunflower, olive, peanut, safflower, corn, canola, and cottonseed. Salad dressings or mayonnaise that are made with a vegetable oil. Limit added fats and oils that you use for cooking, baking, salads, and as spreads.  Other Cocoa powder. Coffee and tea. Most condiments.  The items listed above may not be a complete list of recommended foods or beverages. Contact your dietitian for more options.

## 2019-06-02 ENCOUNTER — Ambulatory Visit: Payer: PRIVATE HEALTH INSURANCE | Admitting: Family Medicine

## 2019-06-03 LAB — HM DIABETES EYE EXAM

## 2019-06-18 IMAGING — CR DG CHEST 2V
2 series · 2 of 2 positions shown · non-contrast
Comparison: PA and lateral chest 04/22/2017.

CLINICAL DATA: Cough, fever and congestion for 1 week.

EXAM:
CHEST  2 VIEW

[w chest pa]
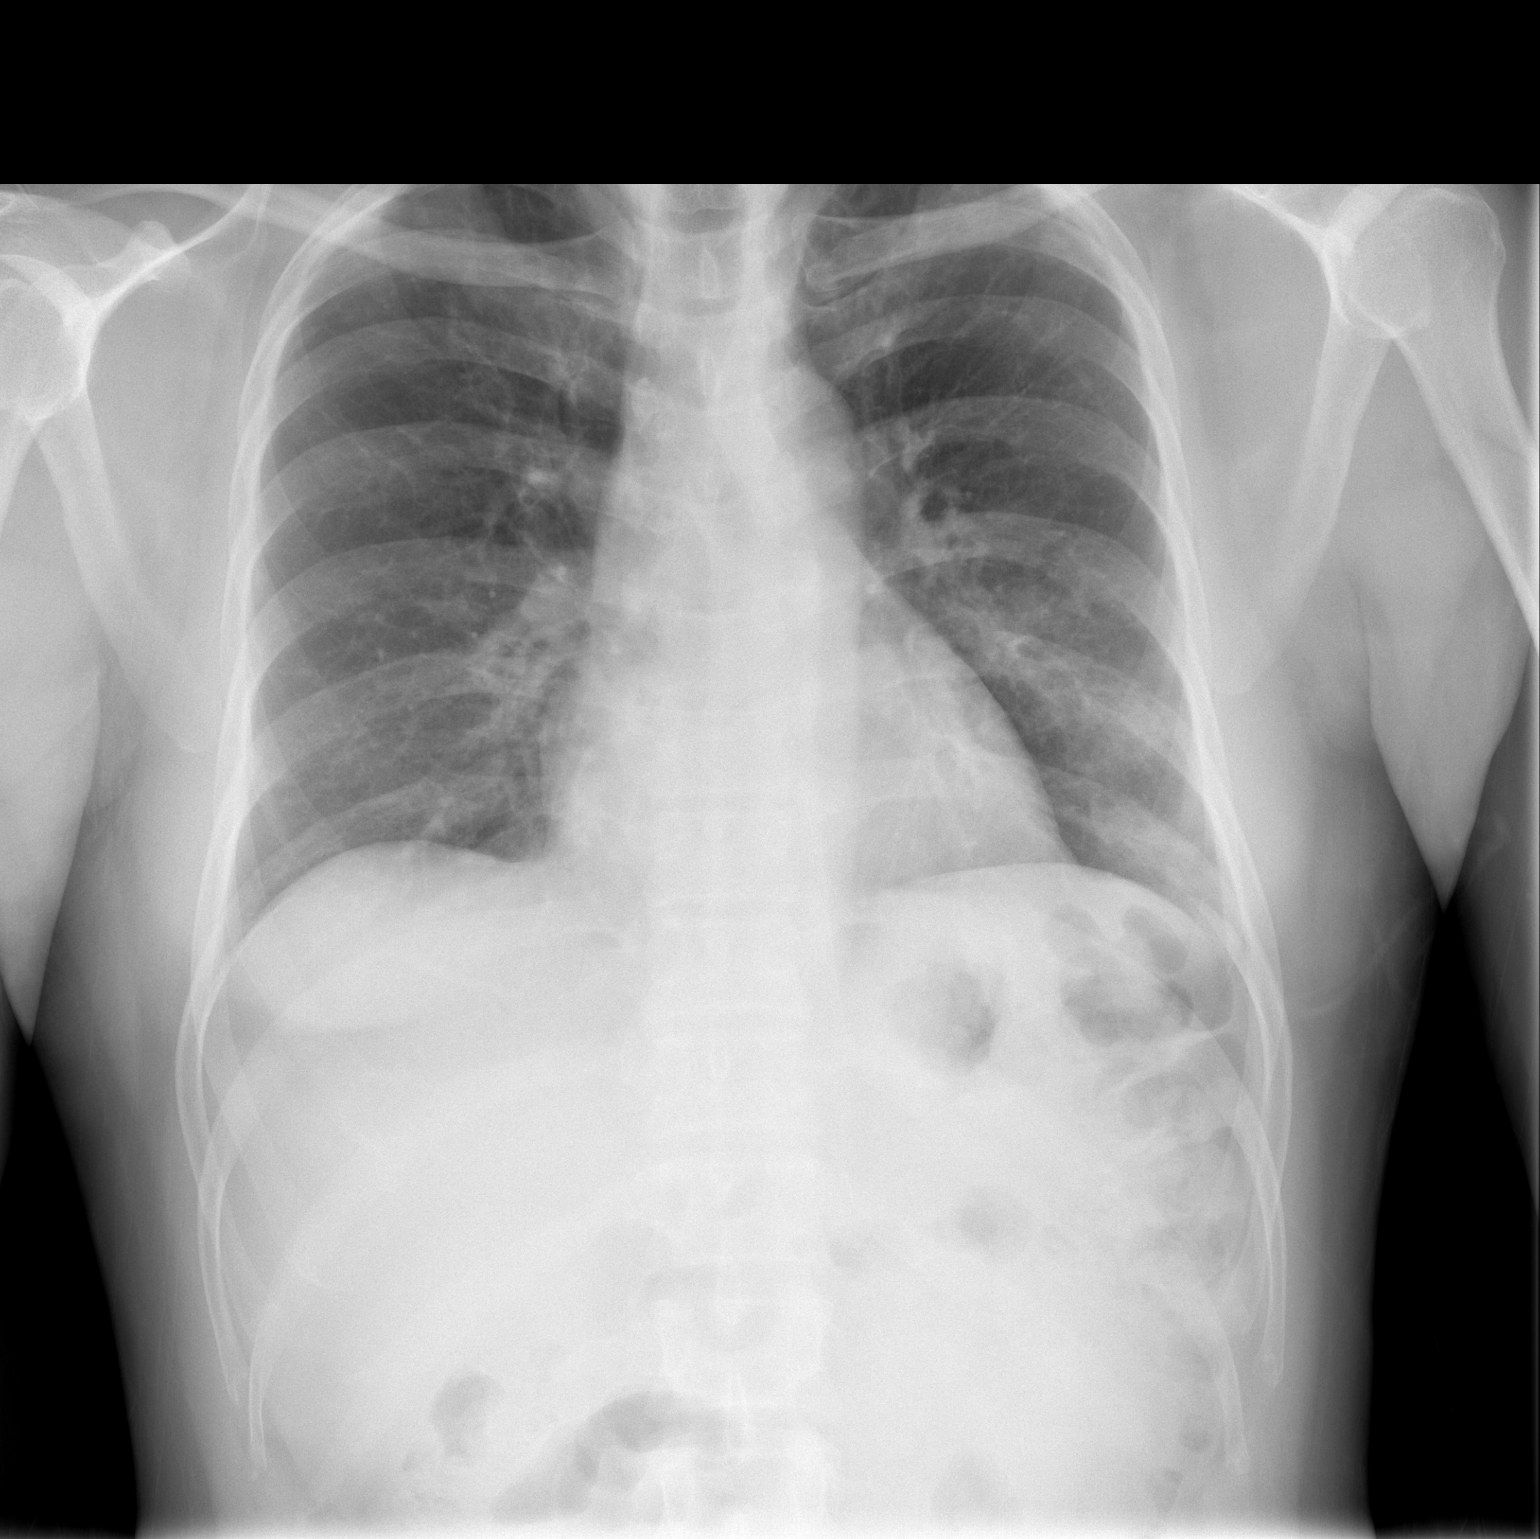

[w chest lat]
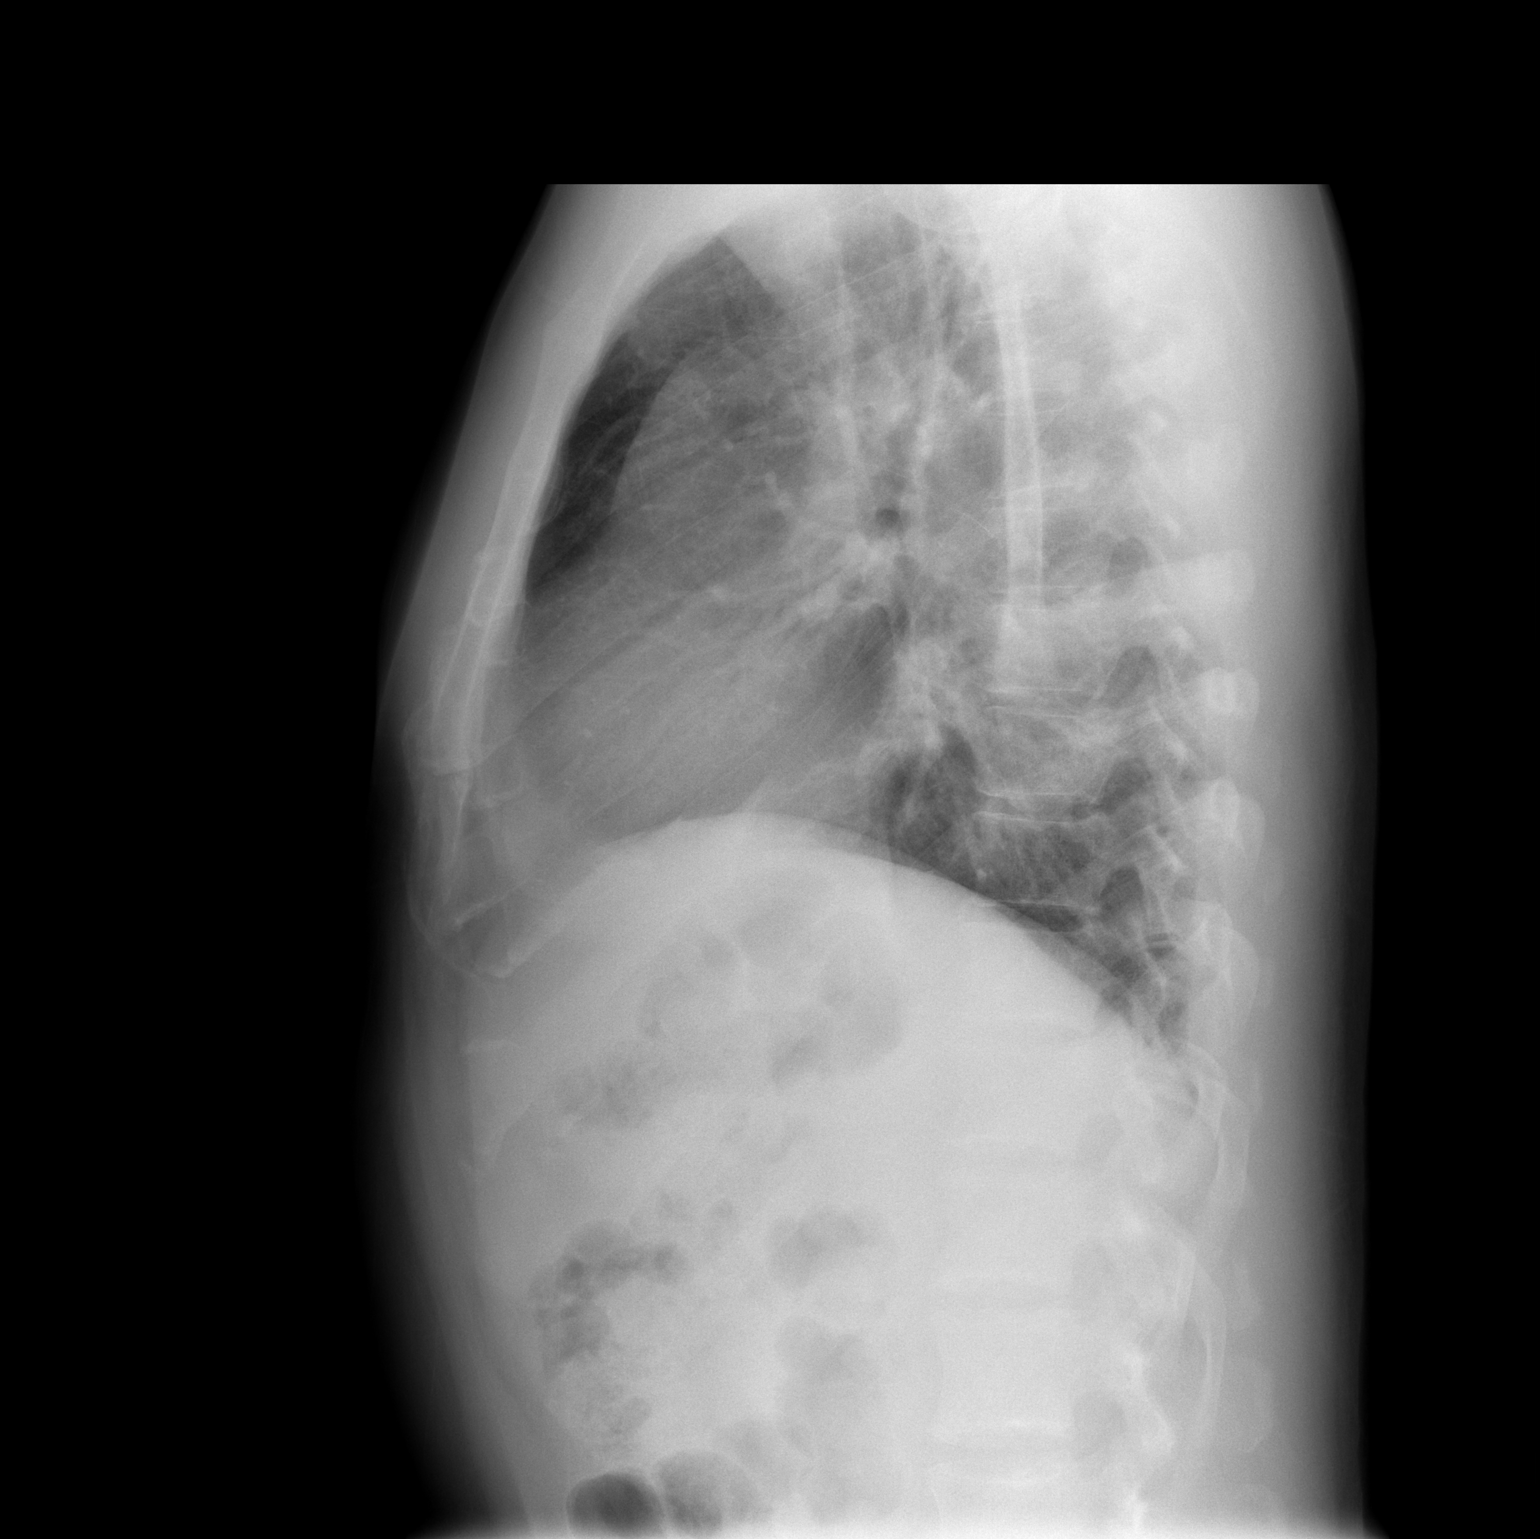

[2 of 2 positions shown; findings below may reference images not displayed]

FINDINGS: Patchy airspace disease in the left lower lobe appears mildly
improved. The right lung is clear. Heart size is normal. No
pneumothorax or pleural effusion.
IMPRESSION: Some improvement and patchy left lower lobe airspace disease. No new
abnormality.

## 2019-06-28 ENCOUNTER — Ambulatory Visit (INDEPENDENT_AMBULATORY_CARE_PROVIDER_SITE_OTHER): Payer: BC Managed Care – PPO

## 2019-06-28 ENCOUNTER — Ambulatory Visit: Payer: BC Managed Care – PPO | Admitting: Podiatry

## 2019-06-28 ENCOUNTER — Other Ambulatory Visit: Payer: Self-pay

## 2019-06-28 VITALS — Temp 96.0°F

## 2019-06-28 DIAGNOSIS — M79671 Pain in right foot: Secondary | ICD-10-CM | POA: Diagnosis not present

## 2019-06-28 DIAGNOSIS — E0843 Diabetes mellitus due to underlying condition with diabetic autonomic (poly)neuropathy: Secondary | ICD-10-CM

## 2019-06-28 DIAGNOSIS — M79676 Pain in unspecified toe(s): Secondary | ICD-10-CM | POA: Diagnosis not present

## 2019-06-28 DIAGNOSIS — M79672 Pain in left foot: Secondary | ICD-10-CM

## 2019-06-28 DIAGNOSIS — B351 Tinea unguium: Secondary | ICD-10-CM

## 2019-06-28 DIAGNOSIS — L989 Disorder of the skin and subcutaneous tissue, unspecified: Secondary | ICD-10-CM | POA: Diagnosis not present

## 2019-06-29 ENCOUNTER — Other Ambulatory Visit: Payer: Self-pay

## 2019-06-30 ENCOUNTER — Other Ambulatory Visit: Payer: Self-pay | Admitting: Family Medicine

## 2019-06-30 ENCOUNTER — Other Ambulatory Visit: Payer: Self-pay

## 2019-06-30 ENCOUNTER — Encounter: Payer: Self-pay | Admitting: Family Medicine

## 2019-06-30 ENCOUNTER — Ambulatory Visit: Payer: BC Managed Care – PPO | Admitting: Family Medicine

## 2019-06-30 VITALS — BP 152/88 | HR 89 | Temp 95.9°F | Ht 70.0 in | Wt 176.2 lb

## 2019-06-30 DIAGNOSIS — I10 Essential (primary) hypertension: Secondary | ICD-10-CM | POA: Diagnosis not present

## 2019-06-30 DIAGNOSIS — B353 Tinea pedis: Secondary | ICD-10-CM | POA: Diagnosis not present

## 2019-06-30 DIAGNOSIS — E119 Type 2 diabetes mellitus without complications: Secondary | ICD-10-CM | POA: Diagnosis not present

## 2019-06-30 LAB — BASIC METABOLIC PANEL
BUN: 19 mg/dL (ref 6–23)
CO2: 27 mEq/L (ref 19–32)
Calcium: 9.4 mg/dL (ref 8.4–10.5)
Chloride: 103 mEq/L (ref 96–112)
Creatinine, Ser: 1.1 mg/dL (ref 0.40–1.50)
GFR: 84.65 mL/min (ref >60.00)
Glucose, Bld: 209 mg/dL — ABNORMAL HIGH (ref 70–99)
Potassium: 4.2 mEq/L (ref 3.5–5.1)
Sodium: 137 mEq/L (ref 135–145)

## 2019-06-30 LAB — GLUCOSE, POCT (MANUAL RESULT ENTRY): POC Glucose: 240 mg/dl — AB (ref 70–99)

## 2019-06-30 MED ORDER — GLYBURIDE 5 MG PO TABS: 10.0000 mg | ORAL_TABLET | Freq: Two times a day (BID) | ORAL | 3 refills | Status: DC

## 2019-06-30 MED ORDER — KETOCONAZOLE 2 % EX CREA: 1.0000 "application " | TOPICAL_CREAM | Freq: Every day | CUTANEOUS | 0 refills | Status: AC

## 2019-06-30 MED ORDER — CHLORTHALIDONE 25 MG PO TABS: 25.0000 mg | ORAL_TABLET | Freq: Every day | ORAL | 2 refills | Status: DC

## 2019-06-30 NOTE — Progress Notes (Signed)
Chief Complaint  Patient presents with  . Follow-up    Diabetes and BP    Subjective John [name] is a 54 y.o. male who presents for hypertension follow up. He does monitor home blood pressures. Blood pressures ranging from 140's/90-100's on average. He is compliant with medications- Norvasc 5 mg/d, lisinopril 20 mg/d. Patient has these side effects of medication: none He is adhering to a healthy diet overall. Current exercise: active at work (works at [employer])  DM II Taking Metformin XR 1000 mg/d and glyburide 5 mg bid. Compliant, no AE's. Does not check sugars at home. Has difficulty w lancets. Diet/exercise as above.    Past Medical History:  Diagnosis Date  . Asthma   . Diabetes mellitus without complication (HCC)   . History of chicken pox   . History of DVT (deep vein thrombosis)    s/p trauma of R leg -- 2014. No other history of DVT  . History of kidney stones   . Hypertension   . Lazy eye of left side     Exam BP (!) 152/88 (BP Location: Left Arm, Patient Position: Sitting, Cuff Size: Normal)   Pulse 89   Temp (!) 95.9 F (35.5 C) (Temporal)   Ht 5\' 10"  (1.778 m)   Wt 176 lb 4 oz (79.9 kg)   SpO2 95%   BMI 25.29 kg/m  General:  well developed, well nourished, in no apparent distress Heart: RRR, no bruits, no LE edema Lungs: clear to auscultation, no accessory muscle use Skin: Macerated tissue between R digits 3/4 on foot.  No other lesions noted Neuro: Sensation intact to pinprick b/l feet Psych: well oriented with normal range of affect and appropriate judgment/insight  Type 2 diabetes mellitus without complication, without long-term current use of insulin (HCC) - Plan: POCT Glucose (CBG)  Essential hypertension - Plan: Basic metabolic panel, chlorthalidone (HYGROTON) 25 MG tablet  Tinea pedis of right foot - Plan: ketoconazole (NIZORAL) 2 % cream  1- Ck BMP. Cont meds for now, may need to add another. Will consider GLP-1 vs insulin vs Actos. Counseled  on diet and exercise. 2- Add chlorthalidone. Cont monitoring BP's at home. 3- 6 weeks of Ketoconazole.  F/u in 1 mo. The patient voiced understanding and agreement to the plan.  [place], DO 06/30/19  11:09 AM

## 2019-06-30 NOTE — Patient Instructions (Addendum)
Keep the diet clean and stay active.  Keep checking your blood pressure at home.  We will be in touch regarding your labs and if we need to make any changes with your diabetes regimen.   Let us know if you need anything.

## 2019-07-01 NOTE — Progress Notes (Signed)
    Subjective: Patient is a 54 y.o. male with PMHx of diabetes mellitus (last A1C on 05/28/2019 was 15.3) presenting to the office today as a new patient with a chief complaint of burning pain secondary to callus lesion(s) noted to the bilateral feet that have been present for the past few years. Walking increases his pain. He has not had any treatment for the symptoms.  Patient also complains of elongated, thickened nails that cause pain while ambulating in shoes. He is unable to trim his own nails. Patient presents today for further treatment and evaluation.  Past Medical History:  Diagnosis Date  . Asthma   . Diabetes mellitus without complication (HCC)   . History of chicken pox   . History of DVT (deep vein thrombosis)    s/p trauma of R leg -- 2014. No other history of DVT  . History of kidney stones   . Hypertension   . Lazy eye of left side     Objective:  Physical Exam General: Alert and oriented x3 in no acute distress  Dermatology: Hyperkeratotic lesion(s) present on the bilateral feet. Pain on palpation with a central nucleated core noted. Skin is warm, dry and supple bilateral lower extremities. Negative for open lesions or macerations. Nails are tender, long, thickened and dystrophic with subungual debris, consistent with onychomycosis, 1-5 bilateral. No signs of infection noted.  Vascular: Palpable pedal pulses bilaterally. No edema or erythema noted. Capillary refill within normal limits.  Neurological: Epicritic and protective threshold diminished bilaterally.   Musculoskeletal Exam: Pain on palpation at the keratotic lesion(s) noted. Range of motion within normal limits bilateral. Muscle strength 5/5 in all groups bilateral.  Radiographic Exam:  Normal osseous mineralization. Joint spaces preserved. No fracture/dislocation/boney destruction.    Assessment: 1. Onychodystrophic nails 1-5 bilateral with hyperkeratosis of nails.  2. Onychomycosis of nail due to  dermatophyte bilateral 3. Pre-ulcerative callus lesions noted to the bilateral feet x 4 4. Diabetes mellitus type II - uncontrolled    Plan of Care:  1. Patient evaluated. X-Rays reviewed.  2. Excisional debridement of keratoic lesion(s) using a chisel blade was performed without incident.  3. Dressed with light dressing. 4. Mechanical debridement of nails 1-5 bilaterally performed using a nail nipper. Filed with dremel without incident.  5. Continue management of diabetes mellitus with PCP.  6. Patient is to return to the clinic in 3 months.   Felecia Shelling, DPM [facility]  Dr. Felecia Shelling, DPM    [address]                                        [place], [place] [address]                Office [phone]  Fax [phone]

## 2019-07-08 ENCOUNTER — Telehealth: Payer: Self-pay | Admitting: *Deleted

## 2019-07-08 DIAGNOSIS — I10 Essential (primary) hypertension: Secondary | ICD-10-CM

## 2019-07-08 NOTE — Telephone Encounter (Signed)
Pt has lab appt scheduled for tomorrow at 8:30am but I do not see in orders in Epic.  Please place future orders if appropriate.

## 2019-07-09 ENCOUNTER — Other Ambulatory Visit (INDEPENDENT_AMBULATORY_CARE_PROVIDER_SITE_OTHER): Payer: BC Managed Care – PPO

## 2019-07-09 ENCOUNTER — Other Ambulatory Visit: Payer: Self-pay

## 2019-07-09 DIAGNOSIS — I10 Essential (primary) hypertension: Secondary | ICD-10-CM | POA: Diagnosis not present

## 2019-07-09 LAB — BASIC METABOLIC PANEL
BUN: 23 mg/dL (ref 6–23)
CO2: 25 mEq/L (ref 19–32)
Calcium: 9.9 mg/dL (ref 8.4–10.5)
Chloride: 102 mEq/L (ref 96–112)
Creatinine, Ser: 1.18 mg/dL (ref 0.40–1.50)
GFR: 78.06 mL/min (ref >60.00)
Glucose, Bld: 177 mg/dL — ABNORMAL HIGH (ref 70–99)
Potassium: 3.9 mEq/L (ref 3.5–5.1)
Sodium: 137 mEq/L (ref 135–145)

## 2019-07-20 ENCOUNTER — Other Ambulatory Visit: Payer: Self-pay | Admitting: Family Medicine

## 2019-07-20 DIAGNOSIS — I10 Essential (primary) hypertension: Secondary | ICD-10-CM

## 2019-07-22 ENCOUNTER — Other Ambulatory Visit: Payer: Self-pay | Admitting: Family Medicine

## 2019-07-26 ENCOUNTER — Ambulatory Visit: Payer: BC Managed Care – PPO | Admitting: Family Medicine

## 2019-07-26 ENCOUNTER — Encounter: Payer: Self-pay | Admitting: Family Medicine

## 2019-07-26 ENCOUNTER — Other Ambulatory Visit: Payer: Self-pay

## 2019-07-26 VITALS — BP 136/84 | HR 105 | Temp 96.2°F | Ht 70.0 in | Wt 172.0 lb

## 2019-07-26 DIAGNOSIS — H6123 Impacted cerumen, bilateral: Secondary | ICD-10-CM

## 2019-07-26 DIAGNOSIS — I10 Essential (primary) hypertension: Secondary | ICD-10-CM | POA: Diagnosis not present

## 2019-07-26 NOTE — Progress Notes (Signed)
Chief Complaint  Patient presents with  . Follow-up  . Ear Pain    Subjective John [name] is a 54 y.o. male who presents for hypertension follow up. He does monitor home blood pressures. Blood pressures ranging from 130's/80's on average. He is compliant with medications- Norvasc 5 mg/d, lisinopril 20 mg/d, chlorthalidone 25 mg/d. Patient has these side effects of medication: none He is adhering to a healthy diet overall. Current exercise: active at work  L ear pain over past 2 weeks. No inj. Hx of wax buildup. +hearing loss. No allergies or URI s/s's.    Past Medical History:  Diagnosis Date  . Asthma   . Diabetes mellitus without complication (HCC)   . History of chicken pox   . History of DVT (deep vein thrombosis)    s/p trauma of R leg -- 2014. No other history of DVT  . History of kidney stones   . Hypertension   . Lazy eye of left side      Exam BP 136/84 (BP Location: Right Arm, Patient Position: Sitting, Cuff Size: Normal)   Pulse (!) 105   Temp (!) 96.2 F (35.7 C) (Temporal)   Ht 5\' 10"  (1.778 m)   Wt 172 lb (78 kg)   SpO2 96%   BMI 24.68 kg/m  General:  well developed, well nourished, in no apparent distress Ears: 100% obstructed w cerumen b/l Heart: RRR, no bruits, no LE edema Lungs: clear to auscultation, no accessory muscle use Psych: well oriented with normal range of affect and appropriate judgment/insight  Procedure note: Cerumen removal irrigation Verbal consent obtained. Robin Ewing, CMA performed procedure. A mixture of warm water and Dulcolax was used to irrigate ear. Cerumen successfully removed. Repeated on contralateral side.  Pt reported immediate improvement. Pt tolerated procedure well. There were no immediate complications noted.  Essential hypertension  Bilateral impacted cerumen  1- Cont current meds. OK to stop cking bp. Counseled on diet and exercise. 2- Removed today. Home irrigation instructions verbalized and written  down.  F/u in 5 weeks for DM visit. The patient voiced understanding and agreement to the plan.  [place], DO 07/26/19  9:40 AM

## 2019-07-26 NOTE — Patient Instructions (Addendum)
Keep the diet clean and stay active.  Because your blood pressure is well-controlled, you no longer have to check your blood pressure at home anymore unless you wish. Some people check it twice daily every day and some people stop altogether. Either or anything in between is fine. Strong work!  OK to use Debrox (peroxide) in the ear to loosen up wax. Also recommend using a bulb syringe (for removing boogers from baby's noses) to flush through warm water. Do not use Q-tips as this can impact wax further.  Let us know if you need anything.

## 2019-08-24 ENCOUNTER — Ambulatory Visit: Payer: BC Managed Care – PPO | Admitting: Family Medicine

## 2019-08-27 ENCOUNTER — Other Ambulatory Visit (INDEPENDENT_AMBULATORY_CARE_PROVIDER_SITE_OTHER): Payer: BC Managed Care – PPO

## 2019-08-27 ENCOUNTER — Other Ambulatory Visit: Payer: Self-pay | Admitting: Family Medicine

## 2019-08-27 ENCOUNTER — Other Ambulatory Visit: Payer: Self-pay

## 2019-08-27 ENCOUNTER — Ambulatory Visit: Payer: BC Managed Care – PPO | Admitting: Family Medicine

## 2019-08-27 ENCOUNTER — Encounter: Payer: Self-pay | Admitting: Family Medicine

## 2019-08-27 VITALS — BP 132/84 | HR 105 | Temp 96.3°F | Ht 70.0 in | Wt 174.0 lb

## 2019-08-27 DIAGNOSIS — I10 Essential (primary) hypertension: Secondary | ICD-10-CM | POA: Diagnosis not present

## 2019-08-27 DIAGNOSIS — E1165 Type 2 diabetes mellitus with hyperglycemia: Secondary | ICD-10-CM

## 2019-08-27 DIAGNOSIS — D729 Disorder of white blood cells, unspecified: Secondary | ICD-10-CM

## 2019-08-27 DIAGNOSIS — R000 Tachycardia, unspecified: Secondary | ICD-10-CM

## 2019-08-27 DIAGNOSIS — D72818 Other decreased white blood cell count: Secondary | ICD-10-CM

## 2019-08-27 LAB — CBC
HCT: 35.2 % — ABNORMAL LOW (ref 39.0–52.0)
Hemoglobin: 12.1 g/dL — ABNORMAL LOW (ref 13.0–17.0)
MCHC: 34.5 g/dL (ref 30.0–36.0)
MCV: 91.1 fl (ref 78.0–100.0)
Platelets: 224 10*3/uL (ref 150.0–400.0)
RBC: 3.86 Mil/uL — ABNORMAL LOW (ref 4.22–5.81)
RDW: 12.5 % (ref 11.5–15.5)
WBC: 8.2 10*3/uL (ref 4.0–10.5)

## 2019-08-27 LAB — TSH: TSH: 1.16 u[IU]/mL (ref 0.35–4.50)

## 2019-08-27 LAB — T4, FREE: Free T4: 0.97 ng/dL (ref 0.60–1.60)

## 2019-08-27 LAB — IBC + FERRITIN
Ferritin: 245.3 ng/mL (ref 22.0–322.0)
Iron: 61 ug/dL (ref 42–165)
Saturation Ratios: 16.9 % — ABNORMAL LOW (ref 20.0–50.0)
Transferrin: 258 mg/dL (ref 212.0–360.0)

## 2019-08-27 LAB — HEMOGLOBIN A1C: Hgb A1c MFr Bld: 9.1 % — ABNORMAL HIGH (ref 4.6–6.5)

## 2019-08-27 MED ORDER — GLYBURIDE 5 MG PO TABS: ORAL_TABLET | ORAL | 3 refills | Status: DC

## 2019-08-27 MED ORDER — DAPAGLIFLOZIN PROPANEDIOL 10 MG PO TABS: 10.0000 mg | ORAL_TABLET | Freq: Every day | ORAL | 3 refills | Status: DC

## 2019-08-27 NOTE — Patient Instructions (Signed)
Give [place] 2-3 business days to get the results of your labs back.   Keep the diet clean and stay active.  We will make any changes based on your lab results.  Let us know if you need anything.

## 2019-08-27 NOTE — Progress Notes (Signed)
Subjective:   Chief Complaint  Patient presents with  . Follow-up    labs    John [name] is a 54 y.o. male here for follow-up of diabetes.   John [name] does not check his sugars.  Patient does not require insulin.   Medications include: metformin XR 1000 mg daily, glyburide 10 mg twice daily Diet is healthy overall. Exercise: Active at work.  Hypertension Patient presents for hypertension follow up. He does not monitor home blood pressures. He is compliant with medications-chlorthalidone 25 mg daily, lisinopril 20 mg daily, amlodipine 5 mg daily. Patient has these side effects of medication: none Diet/exercise as above.   Past Medical History:  Diagnosis Date  . Asthma   . Diabetes mellitus without complication (HCC)   . History of chicken pox   . History of DVT (deep vein thrombosis)    s/p trauma of R leg -- 2014. No other history of DVT  . History of kidney stones   . Hypertension   . Lazy eye of left side      Related testing: Date of retinal exam: Done Pneumovax: done Objective:  BP 132/84 (BP Location: Right Arm, Patient Position: Sitting, Cuff Size: Normal)   Pulse (!) 105   Temp (!) 96.3 F (35.7 C) (Temporal)   Ht 5\' 10"  (1.778 m)   Wt 174 lb (78.9 kg)   SpO2 99%   BMI 24.97 kg/m  General:  Well developed, well nourished, in no apparent distress Head:  Normocephalic, atraumatic Eyes:  Pupils equal and round, sclera anicteric without injection  Lungs:  CTAB, no access msc use Cardio: Tachycardic, regular rhythm, no bruits, no LE edema Psych: Age appropriate judgment and insight  Assessment:   Type 2 diabetes mellitus with hyperglycemia, without long-term current use of insulin (HCC) - Plan: Hemoglobin A1c, CBC  Tachycardia - Plan: TSH, T4, free  Essential hypertension   Plan:   Check above.  Continue current medications for both diabetes and high blood pressure.  Does not need to check blood pressure.  Counseled on diet and exercise. Follow-up in  3-6 months pending above. The patient voiced understanding and agreement to the plan.  [place], DO 08/27/19 10:42 AM

## 2019-08-30 ENCOUNTER — Telehealth: Payer: Self-pay | Admitting: Family Medicine

## 2019-08-30 MED ORDER — PIOGLITAZONE HCL 30 MG PO TABS: 30.0000 mg | ORAL_TABLET | Freq: Every day | ORAL | 3 refills | Status: DC

## 2019-08-30 NOTE — Telephone Encounter (Signed)
Medication  dapagliflozin propanediol (FARXIGA) 10 MG TABS tablet    Patient was told to call if this meds was too expensive. He stated they wanted  $1200 and he is not able to afford that. Patient is looking for a replacement medication. Please advise   He also has questions about his low blood count

## 2019-08-30 NOTE — Telephone Encounter (Signed)
Called left message to call back 

## 2019-08-30 NOTE — Telephone Encounter (Signed)
Will remove Farxiga and add Actos.  We will recheck blood count at his next visit. Iron levels are normal.  Ty.

## 2019-08-30 NOTE — Telephone Encounter (Signed)
Called informed the patient of change and response to questions

## 2019-09-18 ENCOUNTER — Other Ambulatory Visit: Payer: Self-pay | Admitting: Family Medicine

## 2019-09-24 ENCOUNTER — Other Ambulatory Visit: Payer: Self-pay | Admitting: Family Medicine

## 2019-09-24 DIAGNOSIS — I10 Essential (primary) hypertension: Secondary | ICD-10-CM

## 2019-09-29 ENCOUNTER — Ambulatory Visit: Payer: BC Managed Care – PPO | Admitting: Podiatry

## 2019-10-21 ENCOUNTER — Other Ambulatory Visit: Payer: Self-pay | Admitting: Family Medicine

## 2019-10-21 MED ORDER — ALBUTEROL SULFATE HFA 108 (90 BASE) MCG/ACT IN AERS: 2.0000 | INHALATION_SPRAY | Freq: Four times a day (QID) | RESPIRATORY_TRACT | 5 refills | Status: DC | PRN

## 2019-10-21 NOTE — Telephone Encounter (Signed)
Refills sent

## 2019-10-21 NOTE — Telephone Encounter (Signed)
New message:   1.Medication Requested: albuterol (PROVENTIL HFA;VENTOLIN HFA) 108 (90 Base) MCG/ACT inhaler 2. Pharmacy (Name, Street, [place]): [id number] - [place],  - [address] 3. On Med List: yes  4. Last Visit with PCP: 08/27/19  5. Next visit date with PCP: 12/01/19   Agent: Please be advised that RX refills may take up to 3 business days. We ask that you follow-up with your pharmacy.

## 2019-11-05 ENCOUNTER — Other Ambulatory Visit: Payer: Self-pay | Admitting: Family Medicine

## 2019-12-01 ENCOUNTER — Ambulatory Visit: Payer: BC Managed Care – PPO | Admitting: Family Medicine

## 2019-12-01 ENCOUNTER — Encounter: Payer: Self-pay | Admitting: Family Medicine

## 2019-12-01 ENCOUNTER — Other Ambulatory Visit: Payer: Self-pay

## 2019-12-01 VITALS — BP 140/90 | HR 111 | Temp 98.4°F | Ht 70.0 in | Wt 174.1 lb

## 2019-12-01 DIAGNOSIS — I10 Essential (primary) hypertension: Secondary | ICD-10-CM | POA: Diagnosis not present

## 2019-12-01 DIAGNOSIS — Z114 Encounter for screening for human immunodeficiency virus [HIV]: Secondary | ICD-10-CM | POA: Diagnosis not present

## 2019-12-01 DIAGNOSIS — E1165 Type 2 diabetes mellitus with hyperglycemia: Secondary | ICD-10-CM

## 2019-12-01 DIAGNOSIS — Z1159 Encounter for screening for other viral diseases: Secondary | ICD-10-CM | POA: Diagnosis not present

## 2019-12-01 MED ORDER — CARVEDILOL 25 MG PO TABS: 25.0000 mg | ORAL_TABLET | Freq: Two times a day (BID) | ORAL | 3 refills | Status: DC

## 2019-12-01 MED ORDER — LEVOCETIRIZINE DIHYDROCHLORIDE 5 MG PO TABS: 5.0000 mg | ORAL_TABLET | Freq: Every evening | ORAL | 2 refills | Status: DC

## 2019-12-01 MED ORDER — AMLODIPINE BESYLATE 5 MG PO TABS: 5.0000 mg | ORAL_TABLET | Freq: Every day | ORAL | 2 refills | Status: DC

## 2019-12-01 NOTE — Patient Instructions (Addendum)
Give [place] 2-3 business days to get the results of your labs back.   Keep the diet clean and stay active.  Stop chewing gum, drinking carbonated beverages, gulping liquids, and drinking alcohol to help with belching.  These foods may cause you to belch more: Wheat, barley, rye, onion, leek, white part of spring onion, garlic, shallots, artichokes, beetroot, fennel, peas, chicory, pistachio, cashews, legumes, lentils, and chickpeas; Milk, custard, ice cream, and yogurt; Apples, pears, mangoes, cherries, watermelon, asparagus, sugar snap peas, honey, high-fructose corn syrup; Apricots, nectarines, peaches, plums, mushrooms, cauliflower, artificially sweetened chewing gum and confectionery  Claritin (loratadine), Allegra (fexofenadine), Zyrtec (cetirizine) which is also equivalent to Xyzal (levocetirizine); these are listed in order from weakest to strongest. Generic, and therefore cheaper, options are in the parentheses.   Flonase (fluticasone); nasal spray that is over the counter. 2 sprays each nostril, once daily. Aim towards the same side eye when you spray.  There are available OTC, and the generic versions, which may be cheaper, are in parentheses. Show this to a pharmacist if you have trouble finding any of these items.  Let us know if you need anything.

## 2019-12-01 NOTE — Progress Notes (Signed)
Subjective:   Chief Complaint  Patient presents with  . Follow-up    John [name] is a 54 y.o. male here for follow-up of diabetes.   Otoniel does not routinely monitor labs.  Patient does not require insulin.   Medications include: metformin XR 1000 mg/d, glyburide 10 mg bid Diet is fair.  Exercise: active at work.  Hypertension Patient presents for hypertension follow up. He does monitor home blood pressures. Blood pressures ranging on average from 130-140's/70-80's. He is compliant with medications- Norvasc 5 mg/d, lisinopril 20 mg/d, chlorthalidone 25 mg/d. Patient has these side effects of medication: reflux Diet/exercise as above.   Past Medical History:  Diagnosis Date  . Asthma   . Diabetes mellitus without complication (HCC)   . History of chicken pox   . History of DVT (deep vein thrombosis)    s/p trauma of R leg -- 2014. No other history of DVT  . History of kidney stones   . Hypertension   . Lazy eye of left side      Related testing: Retinal exam: Done Pneumovax: done  Objective:  BP 140/90 (BP Location: Left Arm, Patient Position: Sitting, Cuff Size: Normal)   Pulse (!) 111   Temp 98.4 F (36.9 C) (Oral)   Ht 5\' 10"  (1.778 m)   Wt 174 lb 2 oz (79 kg)   SpO2 99%   BMI 24.98 kg/m  General:  Well developed, well nourished, in no apparent distress Skin:  Warm, no pallor or diaphoresis Head:  Normocephalic, atraumatic Eyes:  Pupils equal and round, sclera anicteric without injection  Lungs:  CTAB, no access msc use Cardio:  RRR (HR around 84 during my exam), no bruits, no LE edema Psych: Age appropriate judgment and insight  Assessment:   Type 2 diabetes mellitus with hyperglycemia, without long-term current use of insulin (HCC) - Plan: Hemoglobin A1c  Essential hypertension - Plan: carvedilol (COREG) 25 MG tablet, amLODipine (NORVASC) 5 MG tablet  Encounter for hepatitis C screening test for low risk patient - Plan: Hepatitis C  antibody  Screening for HIV (human immunodeficiency virus) - Plan: HIV Antibody (routine testing w rflx)   Plan:   Counseled on diet and exercise. Change chlorthalidone, as the most likely culprit for his GI irritation, to Coreg.  Interested to see what his A1c is.  F/u in 1 mo to reck BP. The patient voiced understanding and agreement to the plan.  [place], DO 12/01/19 9:05 AM

## 2019-12-02 ENCOUNTER — Other Ambulatory Visit: Payer: Self-pay | Admitting: Family Medicine

## 2019-12-02 LAB — HEMOGLOBIN A1C
Hgb A1c MFr Bld: 11.7 % of total Hgb — ABNORMAL HIGH (ref <5.7)
Mean Plasma Glucose: 289 (calc)
eAG (mmol/L): 16 (calc)

## 2019-12-02 LAB — HIV ANTIBODY (ROUTINE TESTING W REFLEX): HIV 1&2 Ab, 4th Generation: NONREACTIVE

## 2019-12-02 LAB — HEPATITIS C ANTIBODY
Hepatitis C Ab: NONREACTIVE
SIGNAL TO CUT-OFF: 0.17 (ref <1.00)

## 2019-12-02 MED ORDER — METFORMIN HCL ER 500 MG PO TB24: 1000.0000 mg | ORAL_TABLET | Freq: Two times a day (BID) | ORAL | 2 refills | Status: DC

## 2019-12-06 ENCOUNTER — Encounter: Payer: Self-pay | Admitting: Family Medicine

## 2020-01-04 ENCOUNTER — Ambulatory Visit: Payer: BC Managed Care – PPO | Admitting: Family Medicine

## 2020-01-04 ENCOUNTER — Encounter: Payer: Self-pay | Admitting: Family Medicine

## 2020-01-04 ENCOUNTER — Other Ambulatory Visit: Payer: Self-pay

## 2020-01-04 VITALS — BP 130/80 | HR 79 | Temp 98.0°F | Ht 70.0 in | Wt 174.4 lb

## 2020-01-04 DIAGNOSIS — I10 Essential (primary) hypertension: Secondary | ICD-10-CM | POA: Diagnosis not present

## 2020-01-04 DIAGNOSIS — E1165 Type 2 diabetes mellitus with hyperglycemia: Secondary | ICD-10-CM | POA: Diagnosis not present

## 2020-01-04 DIAGNOSIS — Z23 Encounter for immunization: Secondary | ICD-10-CM | POA: Diagnosis not present

## 2020-01-04 NOTE — Patient Instructions (Signed)
Continue checking your sugars and blood pressure at home.  If blood pressures are >140/90 consistently, let me know.  Keep the diet clean and stay active.  Let us know if you need anything.

## 2020-01-04 NOTE — Progress Notes (Signed)
Chief Complaint  Patient presents with  . Follow-up    1 month    Subjective John [name] is a 54 y.o. male who presents for hypertension follow up. He does monitor home blood pressures. Blood pressures ranging from 130's/70-80's on average. He is compliant with medications. Patient has these side effects of medication: none He is sometimes adhering to a healthy diet overall. Current exercise: active at work  DM Metformin has been increased to twice daily. He is tolerating well. Taking glyburide 10 mg bid also. No low's. Sugars running in the low 100's since making change. Not on insulin.    Past Medical History:  Diagnosis Date  . Asthma   . Diabetes mellitus without complication (HCC)   . History of chicken pox   . History of DVT (deep vein thrombosis)    s/p trauma of R leg -- 2014. No other history of DVT  . History of kidney stones   . Hypertension   . Lazy eye of left side     Exam BP 130/80 (BP Location: Left Arm, Patient Position: Sitting, Cuff Size: Normal)   Pulse 79   Temp 98 F (36.7 C) (Oral)   Ht 5\' 10"  (1.778 m)   Wt 174 lb 6 oz (79.1 kg)   SpO2 99%   BMI 25.02 kg/m  General:  well developed, well nourished, in no apparent distress Heart: RRR, no bruits, no LE edema Lungs: clear to auscultation, no accessory muscle use Psych: well oriented with normal range of affect and appropriate judgment/insight  Primary hypertension  Type 2 diabetes mellitus with hyperglycemia, without long-term current use of insulin (HCC)  Need for influenza vaccination - Plan: Flu Vaccine QUAD 6+ mos PF IM (Fluarix Quad PF)  1. Cont monitoring BP at home. Cont Norvasc 5 mg/d, Coreg 25 mg bid, lisinopril 20 mg/d. If elevated at home, will double dosage of Norvasc to 10 mg/d and let me know. Counseled on diet and exercise. 2. Sounds like sugars are doing much better. Cont Metformin XR 1000 mg bid and glyburide 10 mg bid. Does not want SGLT-2 due to high cost in past.  F/u in  2.5 mo for DM visit. The patient voiced understanding and agreement to the plan.  [place], DO 01/04/20  9:00 AM

## 2020-01-19 ENCOUNTER — Other Ambulatory Visit: Payer: Self-pay

## 2020-01-19 ENCOUNTER — Ambulatory Visit: Payer: BC Managed Care – PPO | Admitting: Podiatry

## 2020-01-19 DIAGNOSIS — M79676 Pain in unspecified toe(s): Secondary | ICD-10-CM | POA: Diagnosis not present

## 2020-01-19 DIAGNOSIS — L989 Disorder of the skin and subcutaneous tissue, unspecified: Secondary | ICD-10-CM | POA: Diagnosis not present

## 2020-01-19 DIAGNOSIS — E0843 Diabetes mellitus due to underlying condition with diabetic autonomic (poly)neuropathy: Secondary | ICD-10-CM

## 2020-01-19 DIAGNOSIS — B351 Tinea unguium: Secondary | ICD-10-CM

## 2020-01-19 NOTE — Progress Notes (Signed)
    Subjective: Patient is a 54 y.o. male with PMHx of diabetes mellitus presenting today for follow-up evaluation of routine nail care.  He complains of elongated thickened hyperkeratotic nails bilateral.  He also states that his feet feel stiff and he has significant calluses to the bilateral forefoot.  Past Medical History:  Diagnosis Date  . Asthma   . Diabetes mellitus without complication (HCC)   . History of chicken pox   . History of DVT (deep vein thrombosis)    s/p trauma of R leg -- 2014. No other history of DVT  . History of kidney stones   . Hypertension   . Lazy eye of left side     Objective:  Physical Exam General: Alert and oriented x3 in no acute distress  Dermatology: Hyperkeratotic lesion(s) present on the bilateral feet. Pain on palpation with a central nucleated core noted. Skin is warm, dry and supple bilateral lower extremities. Negative for open lesions or macerations. Nails are tender, long, thickened and dystrophic with subungual debris, consistent with onychomycosis, 1-5 bilateral. No signs of infection noted.  Vascular: Palpable pedal pulses bilaterally. No edema or erythema noted. Capillary refill within normal limits.  Neurological: Epicritic and protective threshold diminished bilaterally.   Musculoskeletal Exam: Pain on palpation at the keratotic lesion(s) noted. Range of motion within normal limits bilateral. Muscle strength 5/5 in all groups bilateral.  Radiographic Exam:  Normal osseous mineralization. Joint spaces preserved. No fracture/dislocation/boney destruction.    Assessment: 1. Onychodystrophic nails 1-5 bilateral with hyperkeratosis of nails.  2. Onychomycosis of nail due to dermatophyte bilateral 3. Pre-ulcerative callus lesions noted to the bilateral feet x 4 4. Diabetes mellitus type II - uncontrolled    Plan of Care:  1. Patient evaluated. X-Rays reviewed.  2. Excisional debridement of keratoic lesion(s) using a chisel blade  was performed without incident.  3. Dressed with light dressing. 4. Mechanical debridement of nails 1-5 bilaterally performed using a nail nipper. Filed with dremel without incident.  5. Continue management of diabetes mellitus with PCP.  6. Patient is to return to the clinic in 3 months.   Felecia Shelling, DPM [facility]  Dr. Felecia Shelling, DPM    [address]                                        [place], [place] [address]                Office [phone]  Fax [phone]

## 2020-03-21 ENCOUNTER — Ambulatory Visit: Payer: BC Managed Care – PPO | Admitting: Family Medicine

## 2020-03-21 ENCOUNTER — Telehealth: Payer: Self-pay

## 2020-03-21 NOTE — Telephone Encounter (Signed)
Caller states he is needing to reschedule his appointment. Telephone: [phone]

## 2020-03-25 ENCOUNTER — Other Ambulatory Visit: Payer: Self-pay | Admitting: Family Medicine

## 2020-03-25 DIAGNOSIS — I10 Essential (primary) hypertension: Secondary | ICD-10-CM

## 2020-03-27 NOTE — Telephone Encounter (Signed)
Called to reschedule appt  LVM to call office back

## 2020-04-26 ENCOUNTER — Ambulatory Visit: Payer: BC Managed Care – PPO | Admitting: Podiatry

## 2020-07-18 ENCOUNTER — Other Ambulatory Visit: Payer: Self-pay

## 2020-07-18 ENCOUNTER — Encounter: Payer: Self-pay | Admitting: Family Medicine

## 2020-07-18 ENCOUNTER — Ambulatory Visit: Payer: BC Managed Care – PPO | Admitting: Family Medicine

## 2020-07-18 VITALS — BP 138/86 | HR 114 | Temp 98.2°F | Ht 70.0 in | Wt 171.1 lb

## 2020-07-18 DIAGNOSIS — E119 Type 2 diabetes mellitus without complications: Secondary | ICD-10-CM

## 2020-07-18 DIAGNOSIS — I10 Essential (primary) hypertension: Secondary | ICD-10-CM | POA: Diagnosis not present

## 2020-07-18 MED ORDER — METFORMIN HCL ER 500 MG PO TB24: 1000.0000 mg | ORAL_TABLET | Freq: Two times a day (BID) | ORAL | 2 refills | Status: DC

## 2020-07-18 MED ORDER — CARVEDILOL 25 MG PO TABS: 25.0000 mg | ORAL_TABLET | Freq: Two times a day (BID) | ORAL | 2 refills | Status: DC

## 2020-07-18 MED ORDER — ATORVASTATIN CALCIUM 20 MG PO TABS: 20.0000 mg | ORAL_TABLET | Freq: Every day | ORAL | 3 refills | Status: DC

## 2020-07-18 MED ORDER — GLYBURIDE 5 MG PO TABS: ORAL_TABLET | ORAL | 1 refills | Status: DC

## 2020-07-18 MED ORDER — CHLORTHALIDONE 25 MG PO TABS: 25.0000 mg | ORAL_TABLET | Freq: Every day | ORAL | 2 refills | Status: DC

## 2020-07-18 MED ORDER — AMLODIPINE BESYLATE 5 MG PO TABS: 5.0000 mg | ORAL_TABLET | Freq: Every day | ORAL | 2 refills | Status: DC

## 2020-07-18 MED ORDER — LISINOPRIL 20 MG PO TABS: 20.0000 mg | ORAL_TABLET | Freq: Every day | ORAL | 2 refills | Status: DC

## 2020-07-18 MED ORDER — KETOCONAZOLE 2 % EX CREA: 1.0000 "application " | TOPICAL_CREAM | Freq: Every day | CUTANEOUS | 0 refills | Status: AC

## 2020-07-18 NOTE — Progress Notes (Signed)
Subjective:   Chief Complaint  Patient presents with  . Follow-up    diabetes    John [name] is a 55 y.o. male here for follow-up of diabetes.   [name]'s self monitored glucose range is low 100's.  Patient denies hypoglycemic reactions. He checks his glucose levels intermittently.  Patient does not require insulin.   Medications include: Metformin XR 1000 mg twice daily, glyburide 10 mg twice daily Diet is healthy overall.  Exercise: Active at work No CP or SOB.   Hypertension Patient presents for hypertension follow up. He does not monitor home blood pressures. He is compliant with medications-lisinopril 20 mg daily, chlorthalidone 25 mg daily, amlodipine 5 mg daily, Coreg 25 mg daily. Patient has these side effects of medication: none Diet and exercise as above  Past Medical History:  Diagnosis Date  . Asthma   . Diabetes mellitus without complication (HCC)   . History of chicken pox   . History of DVT (deep vein thrombosis)    s/p trauma of R leg -- 2014. No other history of DVT  . History of kidney stones   . Hypertension   . Lazy eye of left side      Related testing: Retinal exam: Due. Scheduled.  Pneumovax: done  Objective:  BP 138/86 (BP Location: Left Arm, Patient Position: Sitting, Cuff Size: Normal)   Pulse (!) 114   Temp 98.2 F (36.8 C) (Oral)   Ht 5\' 10"  (1.778 m)   Wt 171 lb 2 oz (77.6 kg)   SpO2 98%   BMI 24.55 kg/m  General:  Well developed, well nourished, in no apparent distress Skin: Macerated tissue between the toes bilaterally.  There is also a bunion on the left.  Thickened skin noted on the plantar surface of both heels.  Do not appreciate any wounds or ulcers. Head:  Normocephalic, atraumatic Eyes:  Pupils equal and round, sclera anicteric without injection  Lungs:  CTAB, no access msc use Cardio:  RRR, no bruits, no LE edema Musculoskeletal:  Symmetrical muscle groups noted without atrophy or deformity Neuro:  Sensation intact to  pinprick on feet Psych: Age appropriate judgment and insight  Assessment:   Type 2 diabetes mellitus without complication, without long-term current use of insulin (HCC) - Plan: atorvastatin (LIPITOR) 20 MG tablet, glyBURIDE (DIABETA) 5 MG tablet, metFORMIN (GLUCOPHAGE-XR) 500 MG 24 hr tablet, CBC, Comprehensive metabolic panel, Hemoglobin A1c, Lipid panel, Microalbumin / creatinine urine ratio  Essential hypertension - Plan: amLODipine (NORVASC) 5 MG tablet, lisinopril (ZESTRIL) 20 MG tablet, chlorthalidone (HYGROTON) 25 MG tablet, carvedilol (COREG) 25 MG tablet  Essential (primary) hypertension   Plan:   1.  Continue monitoring sugars.  Check above.  Continue metformin XR 1000 mg twice daily and glyburide 10 mg twice daily.  Depending on his A1c, will consider Ozempic versus Actos.  We will try to hold off on insulin.  Counseled on diet and exercise. 2.  Currently controlled.  Continue amlodipine 5 mg daily, lisinopril 20 mg daily, chlorthalidone mg thank you for your favorite twice daily.  Does not need to monitor blood pressure at home. F/u in 3-6 mo pending results. The patient voiced understanding and agreement to the plan.  [place], DO 07/18/20 4:45 PM

## 2020-07-18 NOTE — Patient Instructions (Signed)
Give [place] 2-3 business days to get the results of your labs back.   Make sure to reschedule with your foot doctor.  Our follow up will be based on your lab results.  Let us know if you need anything.

## 2020-07-19 LAB — COMPREHENSIVE METABOLIC PANEL
ALT: 19 U/L (ref 0–53)
AST: 18 U/L (ref 0–37)
Albumin: 4.5 g/dL (ref 3.5–5.2)
Alkaline Phosphatase: 116 U/L (ref 39–117)
BUN: 22 mg/dL (ref 6–23)
CO2: 27 mEq/L (ref 19–32)
Calcium: 10.5 mg/dL (ref 8.4–10.5)
Chloride: 93 mEq/L — ABNORMAL LOW (ref 96–112)
Creatinine, Ser: 1.56 mg/dL — ABNORMAL HIGH (ref 0.40–1.50)
GFR: 50.12 mL/min — ABNORMAL LOW (ref >60.00)
Glucose, Bld: 465 mg/dL — ABNORMAL HIGH (ref 70–99)
Potassium: 4.3 mEq/L (ref 3.5–5.1)
Sodium: 131 mEq/L — ABNORMAL LOW (ref 135–145)
Total Bilirubin: 1 mg/dL (ref 0.2–1.2)
Total Protein: 8.5 g/dL — ABNORMAL HIGH (ref 6.0–8.3)

## 2020-07-19 LAB — CBC
HCT: 40.5 % (ref 39.0–52.0)
Hemoglobin: 13.9 g/dL (ref 13.0–17.0)
MCHC: 34.3 g/dL (ref 30.0–36.0)
MCV: 89.5 fl (ref 78.0–100.0)
Platelets: 173 10*3/uL (ref 150.0–400.0)
RBC: 4.53 Mil/uL (ref 4.22–5.81)
RDW: 12.6 % (ref 11.5–15.5)
WBC: 9.1 10*3/uL (ref 4.0–10.5)

## 2020-07-19 LAB — LIPID PANEL
Cholesterol: 162 mg/dL (ref 0–200)
HDL: 46.7 mg/dL (ref >39.00)
LDL Cholesterol: 88 mg/dL (ref 0–99)
NonHDL: 115.3
Total CHOL/HDL Ratio: 3
Triglycerides: 138 mg/dL (ref 0.0–149.0)
VLDL: 27.6 mg/dL (ref 0.0–40.0)

## 2020-07-19 LAB — MICROALBUMIN / CREATININE URINE RATIO
Creatinine,U: 54.3 mg/dL
Microalb Creat Ratio: 9.2 mg/g (ref 0.0–30.0)
Microalb, Ur: 5 mg/dL — ABNORMAL HIGH (ref 0.0–1.9)

## 2020-07-19 LAB — HEMOGLOBIN A1C: Hgb A1c MFr Bld: 16.1 % — ABNORMAL HIGH (ref 4.6–6.5)

## 2020-07-24 ENCOUNTER — Other Ambulatory Visit: Payer: Self-pay

## 2020-07-24 ENCOUNTER — Ambulatory Visit: Payer: BC Managed Care – PPO | Admitting: Family Medicine

## 2020-07-24 ENCOUNTER — Encounter: Payer: Self-pay | Admitting: Family Medicine

## 2020-07-24 VITALS — BP 118/70 | HR 121 | Temp 98.2°F | Ht 70.0 in | Wt 166.1 lb

## 2020-07-24 DIAGNOSIS — E0843 Diabetes mellitus due to underlying condition with diabetic autonomic (poly)neuropathy: Secondary | ICD-10-CM

## 2020-07-24 MED ORDER — NOVOFINE PLUS PEN NEEDLE 32G X 4 MM MISC: 1 refills | Status: DC

## 2020-07-24 MED ORDER — TRESIBA FLEXTOUCH 100 UNIT/ML ~~LOC~~ SOPN: 12.0000 [IU] | PEN_INJECTOR | Freq: Every day | SUBCUTANEOUS | 2 refills | Status: DC

## 2020-07-24 NOTE — Patient Instructions (Signed)
John [name] is the insulin it looks like your insurance will cover.  If things are too expensive, we can try the [facility] payment card.  Check your sugars in the mornings and give me a few evening/afternoon readings.   Send me a message with your readings in 2 weeks.   Keep the diet clean and stay active.  Let us know if you need anything.

## 2020-07-24 NOTE — Progress Notes (Signed)
Chief Complaint  Patient presents with  . Discuss insulin    Subjective: Patient is a 55 y.o. male here for DM f/u.  The patient had a diabetes visit and his A1c had gone from 11.7-16.1.  He reports a decent diet and is active in his job.  He is compliant with glyburide 10 mg twice daily, metformin XR 1000 mg twice daily.  SGLT-2 inhibitors were too expensive and he was averse to the idea of giving himself a shot.  He does have family members with diabetes who require insulin.  Past Medical History:  Diagnosis Date  . Asthma   . Diabetes mellitus without complication (HCC)   . History of chicken pox   . History of DVT (deep vein thrombosis)    s/p trauma of R leg -- 2014. No other history of DVT  . History of kidney stones   . Hypertension   . Lazy eye of left side     Objective: BP 118/70 (BP Location: Left Arm, Patient Position: Sitting, Cuff Size: Normal)   Pulse (!) 121   Temp 98.2 F (36.8 C) (Oral)   Ht 5\' 10"  (1.778 m)   Wt 166 lb 2 oz (75.4 kg)   SpO2 95%   BMI 23.84 kg/m  General: Awake, appears stated age Lungs: No accessory muscle use Psych: Age appropriate judgment and insight, normal affect and mood  Assessment and Plan: Diabetes mellitus due to underlying condition with diabetic autonomic neuropathy, unspecified whether long term insulin use (HCC) - Plan: insulin degludec (TRESIBA FLEXTOUCH) 100 UNIT/ML FlexTouch Pen  Status: Chronic, uncontrolled.  I offered to refer him to an endocrinologist or come back here to discuss insulin.  appears to be the insulin his insurance prefers.  We will start a basal insulin dosage of 12 units nightly.  He will send me a message with his readings in 2 weeks.  I will see him in 1 month.  He is to let me know if the medicine is too expensive.  Continue metformin XR 1000 mg twice daily and glyburide 10 mg twice daily.  Counseled about exercise. The patient voiced understanding and agreement to the plan.  John [name]  [place], DO 07/24/20  2:07 PM

## 2020-07-24 NOTE — Addendum Note (Signed)
Addended by: Scharlene Gloss B on: 07/24/2020 02:32 PM   Modules accepted: Orders

## 2020-08-14 ENCOUNTER — Ambulatory Visit (INDEPENDENT_AMBULATORY_CARE_PROVIDER_SITE_OTHER): Payer: BC Managed Care – PPO

## 2020-08-14 ENCOUNTER — Ambulatory Visit: Payer: BC Managed Care – PPO | Admitting: Podiatry

## 2020-08-14 ENCOUNTER — Other Ambulatory Visit: Payer: Self-pay

## 2020-08-14 DIAGNOSIS — B351 Tinea unguium: Secondary | ICD-10-CM | POA: Diagnosis not present

## 2020-08-14 DIAGNOSIS — L989 Disorder of the skin and subcutaneous tissue, unspecified: Secondary | ICD-10-CM | POA: Diagnosis not present

## 2020-08-14 DIAGNOSIS — M79672 Pain in left foot: Secondary | ICD-10-CM | POA: Diagnosis not present

## 2020-08-14 DIAGNOSIS — M79675 Pain in left toe(s): Secondary | ICD-10-CM | POA: Diagnosis not present

## 2020-08-14 DIAGNOSIS — L97512 Non-pressure chronic ulcer of other part of right foot with fat layer exposed: Secondary | ICD-10-CM | POA: Diagnosis not present

## 2020-08-14 DIAGNOSIS — M79671 Pain in right foot: Secondary | ICD-10-CM | POA: Diagnosis not present

## 2020-08-14 DIAGNOSIS — M79674 Pain in right toe(s): Secondary | ICD-10-CM

## 2020-08-14 DIAGNOSIS — E0843 Diabetes mellitus due to underlying condition with diabetic autonomic (poly)neuropathy: Secondary | ICD-10-CM | POA: Diagnosis not present

## 2020-08-14 DIAGNOSIS — S90129A Contusion of unspecified lesser toe(s) without damage to nail, initial encounter: Secondary | ICD-10-CM

## 2020-08-25 ENCOUNTER — Ambulatory Visit: Payer: BC Managed Care – PPO | Admitting: Family Medicine

## 2020-08-25 ENCOUNTER — Encounter: Payer: Self-pay | Admitting: Family Medicine

## 2020-08-25 ENCOUNTER — Other Ambulatory Visit: Payer: Self-pay

## 2020-08-25 VITALS — BP 148/90 | HR 93 | Temp 98.0°F | Ht 70.0 in | Wt 171.0 lb

## 2020-08-25 DIAGNOSIS — K219 Gastro-esophageal reflux disease without esophagitis: Secondary | ICD-10-CM | POA: Diagnosis not present

## 2020-08-25 DIAGNOSIS — E1165 Type 2 diabetes mellitus with hyperglycemia: Secondary | ICD-10-CM

## 2020-08-25 DIAGNOSIS — Z794 Long term (current) use of insulin: Secondary | ICD-10-CM | POA: Diagnosis not present

## 2020-08-25 MED ORDER — PANTOPRAZOLE SODIUM 40 MG PO TBEC: 40.0000 mg | DELAYED_RELEASE_TABLET | Freq: Every day | ORAL | 1 refills | Status: DC

## 2020-08-25 MED ORDER — GLYBURIDE 5 MG PO TABS: ORAL_TABLET | ORAL | 1 refills | Status: DC

## 2020-08-25 NOTE — Progress Notes (Signed)
Chief Complaint  Patient presents with  . Follow-up    Acid reflux     Subjective: Patient is a 55 y.o. male here for f/u DM.   Pt started on Tresiba 12 u qhs. He is also continuing glyburide 10 mg bid, metformin XR 1000 mg bid. Actos, Marcelline Deist and [place] Pepper were not affordable for him. Diet is fair, he is active at work. Lifts weights at home. No low's. Sugars have been running at low 100's. No CP or SOB.   GERD Going on for past week. Was taking omeprazole 20 mg OTC that did not help. Has been drinking a lot of diet sodas. It happens after he eats. Stomach churns, heart burn/pain, nausea, water brash, bitter taste. No bleeding, unintentional wt loss, nighttime awakenings.  Of note, he has not taken his blood pressure medicine in the last few days due to the reflux.  Past Medical History:  Diagnosis Date  . Asthma   . Diabetes mellitus without complication (HCC)   . History of chicken pox   . History of DVT (deep vein thrombosis)    s/p trauma of R leg -- 2014. No other history of DVT  . History of kidney stones   . Hypertension   . Lazy eye of left side     Objective: BP (!) 148/90 (BP Location: Left Arm, Patient Position: Sitting, Cuff Size: Normal)   Pulse 93   Temp 98 F (36.7 C) (Oral)   Ht 5\' 10"  (1.778 m)   Wt 171 lb (77.6 kg)   SpO2 94%   BMI 24.54 kg/m  General: Awake, appears stated age HEENT: MMM, EOMi Heart: RRR, no bruits, no lower extremity edema Abdomen: Bowel sounds present, soft, nontender, nondistended. Lungs: CTAB, no rales, wheezes or rhonchi. No accessory muscle use Psych: Age appropriate judgment and insight, normal affect and mood  Assessment and Plan: Type 2 diabetes mellitus with hyperglycemia, with long-term current use of insulin (HCC) - Plan: glyBURIDE (DIABETA) 5 MG tablet  Gastroesophageal reflux disease, unspecified whether esophagitis present - Plan: pantoprazole (PROTONIX) 40 MG tablet  1.  Chronic, seems like it is improving.   Continue glyburide 10 mg twice daily, metformin XR 1000 mg twice daily, Tresiba 12 units nightly.  We will recheck in 2 months.  Counseled on diet and exercise.  Continue to monitor sugars.  He needs to schedule his eye exam. 2.  Chronic, uncontrolled.  Start Protonix 40 mg daily for the next 2 months.  We will reassess at that time.  Reflux precautions discussed. The patient voiced understanding and agreement to the plan.  [place], DO 08/25/20  8:23 AM

## 2020-08-25 NOTE — Patient Instructions (Addendum)
Please continue to check your sugars.  Keep the diet clean and stay active.  The only lifestyle changes that have data behind them are weight loss for the overweight/obese and elevating the head of the bed. Finding out which foods/positions are triggers is important.  Schedule your eye exam please.   Let us know if you need anything.

## 2020-08-28 NOTE — Progress Notes (Signed)
   Subjective:  55 y.o. male with PMHx of diabetes mellitus presenting today for evaluation of a symptomatic callus to the right great toe that is been present for few weeks now.  He does not recall an incident that would have aggravated the area.  He states that slowly over time the callus is built-up and now has some discoloration. He also is states that he is here for routine diabetic foot care.  He complains of symptomatic thickened elongated nails bilateral.  He is unable to trim his own nails.  He presents for further treatment and evaluation   Past Medical History:  Diagnosis Date  . Asthma   . Diabetes mellitus without complication (HCC)   . History of chicken pox   . History of DVT (deep vein thrombosis)    s/p trauma of R leg -- 2014. No other history of DVT  . History of kidney stones   . Hypertension   . Lazy eye of left side       Objective/Physical Exam General: The patient is alert and oriented x3 in no acute distress.  Dermatology:  Wound #1 noted to the plantar aspect right hallux measuring approximately 3.0 x 2.0 x 0.2 cm (LxWxD).   To the noted ulceration(s), there is no eschar. There is a moderate amount of slough, fibrin, and necrotic tissue noted. Granulation tissue and wound base is red. There is a minimal amount of serosanguineous drainage noted. There is no exposed bone muscle-tendon ligament or joint. There is no malodor. Periwound integrity is intact. Skin is warm, dry and supple bilateral lower extremities.  Hyperkeratotic elongated thickened nails noted 1-5 bilateral with associated tenderness to palpation There is also some hyperkeratotic preulcerative callus lesions noted to the bilateral feet  Vascular: Palpable pedal pulses bilaterally. No edema or erythema noted. Capillary refill within normal limits.  Neurological: Epicritic and protective threshold diminished bilaterally.   Musculoskeletal Exam: Range of motion within normal limits to all pedal and  ankle joints bilateral. Muscle strength 5/5 in all groups bilateral.   Assessment: 1.  Ulcer right hallux secondary to diabetes mellitus 2.  Pain due to onychomycosis of toenails bilateral  3.  Preulcerative callus lesions bilateral  4.  Diabetes mellitus w/ peripheral neuropathy   Plan of Care:  1. Patient was evaluated. 2. medically necessary excisional debridement including subcutaneous tissue was performed using a tissue nipper and a chisel blade. Excisional debridement of all the necrotic nonviable tissue down to healthy bleeding viable tissue was performed with post-debridement measurements same as pre-. 3. the wound was cleansed and dry sterile dressing applied. 4.  Recommend iodine daily to the hallux with a light dressing  5.  Mechanical debridement of nails 1-5 bilateral was performed using a nail nipper without incident or bleeding  6.  Excisional debridement of the hyperkeratotic preulcerative callus lesions was performed using a 312 scalpel without incident or bleeding  7.  Patient is to return to clinic in 4 weeks   Felecia Shelling, DPM [facility]  Dr. Felecia Shelling, DPM    [address] [place], [place] [address]                Office [phone]  Fax [phone]

## 2020-09-04 LAB — HM DIABETES EYE EXAM

## 2020-09-05 ENCOUNTER — Encounter: Payer: Self-pay | Admitting: Family Medicine

## 2020-09-18 ENCOUNTER — Other Ambulatory Visit: Payer: Self-pay

## 2020-09-18 ENCOUNTER — Ambulatory Visit: Payer: BC Managed Care – PPO | Admitting: Podiatry

## 2020-09-18 DIAGNOSIS — L97512 Non-pressure chronic ulcer of other part of right foot with fat layer exposed: Secondary | ICD-10-CM | POA: Diagnosis not present

## 2020-09-18 DIAGNOSIS — E0843 Diabetes mellitus due to underlying condition with diabetic autonomic (poly)neuropathy: Secondary | ICD-10-CM

## 2020-09-18 MED ORDER — GENTAMICIN SULFATE 0.1 % EX CREA: 1.0000 "application " | TOPICAL_CREAM | Freq: Two times a day (BID) | CUTANEOUS | 1 refills | Status: DC

## 2020-09-18 NOTE — Progress Notes (Signed)
   Subjective:  55 y.o. male with PMHx of diabetes mellitus presenting today for follow-up evaluation of an ulcer to the right great toe.  Patient states is feeling much better.  He has been applying iodine daily to the area.  He states that overall there is significant improvement.  No new complaints at this time  Past Medical History:  Diagnosis Date   Asthma    Diabetes mellitus without complication (HCC)    History of chicken pox    History of DVT (deep vein thrombosis)    s/p trauma of R leg -- 2014. No other history of DVT   History of kidney stones    Hypertension    Lazy eye of left side       Objective/Physical Exam General: The patient is alert and oriented x3 in no acute distress.  Dermatology:  Wound #1 noted to the plantar aspect right hallux measuring approximately 0.5 x 0.5 x 0.2 cm (LxWxD).   To the noted ulceration(s), there is no eschar. There is a moderate amount of slough, fibrin, and necrotic tissue noted. Granulation tissue and wound base is red. There is a minimal amount of serosanguineous drainage noted. There is no exposed bone muscle-tendon ligament or joint. There is no malodor. Periwound integrity is intact. Skin is warm, dry and supple bilateral lower extremities. There is also some hyperkeratotic preulcerative callus lesions noted to the bilateral feet  Vascular: Palpable pedal pulses bilaterally. No edema or erythema noted. Capillary refill within normal limits.  Neurological: Epicritic and protective threshold diminished bilaterally.   Musculoskeletal Exam: Range of motion within normal limits to all pedal and ankle joints bilateral. Muscle strength 5/5 in all groups bilateral.   Assessment: 1.  Ulcer right hallux secondary to diabetes mellitus 2.  Preulcerative callus lesions bilateral  3.  Diabetes mellitus w/ peripheral neuropathy   Plan of Care:  1. Patient was evaluated. 2. medically necessary excisional debridement including subcutaneous  tissue was performed using a tissue nipper and a chisel blade. Excisional debridement of all the necrotic nonviable tissue down to healthy bleeding viable tissue was performed with post-debridement measurements same as pre-. 3. the wound was cleansed and dry sterile dressing applied. 4.  Prescription for gentamicin cream.  Apply to the right hallux daily with a Band-Aid 5.  Continue wearing good supportive shoes and sneakers.  Advised against going barefoot 6.  Excisional debridement of the hyperkeratotic preulcerative callus lesions was performed using a 312 scalpel without incident or bleeding  7.  Patient is to return to clinic in 4 weeks   Felecia Shelling, DPM [facility]  Dr. Felecia Shelling, DPM    [address] [place], [place] [address]                Office [phone]  Fax [phone]

## 2020-10-16 ENCOUNTER — Ambulatory Visit: Payer: BC Managed Care – PPO | Admitting: Podiatry

## 2020-10-16 ENCOUNTER — Other Ambulatory Visit: Payer: Self-pay

## 2020-10-16 DIAGNOSIS — L989 Disorder of the skin and subcutaneous tissue, unspecified: Secondary | ICD-10-CM

## 2020-10-16 NOTE — Progress Notes (Signed)
   Subjective: 55 y.o. male presenting to the office today for follow-up evaluation of bilateral calluses that are preulcerative to the bilateral feet.  Patient has a history of diabetes mellitus.  He works on his feet all day.  He presents for further treatment and evaluation   Past Medical History:  Diagnosis Date   Asthma    Diabetes mellitus without complication (HCC)    History of chicken pox    History of DVT (deep vein thrombosis)    s/p trauma of R leg -- 2014. No other history of DVT   History of kidney stones    Hypertension    Lazy eye of left side      Objective:  Physical Exam General: Alert and oriented x3 in no acute distress  Dermatology: Hyperkeratotic lesion(s) present on the bilateral feet. Pain on palpation with a central nucleated core noted. Skin is warm, dry and supple bilateral lower extremities. Negative for open lesions or macerations.  There are no open wounds noted today  Vascular: Palpable pedal pulses bilaterally. No edema or erythema noted. Capillary refill within normal limits.  Neurological: Epicritic and protective threshold grossly intact bilaterally.   Musculoskeletal Exam: Pain on palpation at the keratotic lesion(s) noted. Range of motion within normal limits bilateral. Muscle strength 5/5 in all groups bilateral.  Assessment: 1.  Preulcerative callus lesions bilateral feet   Plan of Care:  1. Patient evaluated 2. Excisional debridement of keratoic lesion(s) using a chisel blade was performed without incident.  3. Dressed area with light dressing. 4. Patient is to return to the clinic PRN.   Felecia Shelling, DPM [facility]  Dr. Felecia Shelling, DPM    [address] [place], [place] [address]                Office [phone]  Fax [phone]

## 2020-10-27 ENCOUNTER — Ambulatory Visit: Payer: BC Managed Care – PPO | Admitting: Family Medicine

## 2020-10-27 ENCOUNTER — Encounter: Payer: Self-pay | Admitting: Family Medicine

## 2020-10-27 ENCOUNTER — Other Ambulatory Visit: Payer: Self-pay | Admitting: Family Medicine

## 2020-10-27 ENCOUNTER — Other Ambulatory Visit: Payer: Self-pay

## 2020-10-27 VITALS — BP 152/92 | HR 88 | Temp 97.0°F | Ht 71.0 in | Wt 181.0 lb

## 2020-10-27 DIAGNOSIS — E1165 Type 2 diabetes mellitus with hyperglycemia: Secondary | ICD-10-CM | POA: Diagnosis not present

## 2020-10-27 DIAGNOSIS — E0843 Diabetes mellitus due to underlying condition with diabetic autonomic (poly)neuropathy: Secondary | ICD-10-CM

## 2020-10-27 DIAGNOSIS — Z794 Long term (current) use of insulin: Secondary | ICD-10-CM

## 2020-10-27 DIAGNOSIS — I10 Essential (primary) hypertension: Secondary | ICD-10-CM | POA: Diagnosis not present

## 2020-10-27 LAB — BASIC METABOLIC PANEL
BUN: 13 mg/dL (ref 6–23)
CO2: 25 mEq/L (ref 19–32)
Calcium: 9.5 mg/dL (ref 8.4–10.5)
Chloride: 105 mEq/L (ref 96–112)
Creatinine, Ser: 1.18 mg/dL (ref 0.40–1.50)
GFR: 69.93 mL/min (ref >60.00)
Glucose, Bld: 251 mg/dL — ABNORMAL HIGH (ref 70–99)
Potassium: 4.3 mEq/L (ref 3.5–5.1)
Sodium: 137 mEq/L (ref 135–145)

## 2020-10-27 LAB — HEMOGLOBIN A1C: Hgb A1c MFr Bld: 9.9 % — ABNORMAL HIGH (ref 4.6–6.5)

## 2020-10-27 MED ORDER — TRESIBA FLEXTOUCH 100 UNIT/ML ~~LOC~~ SOPN: 16.0000 [IU] | PEN_INJECTOR | Freq: Every day | SUBCUTANEOUS | 2 refills | Status: DC

## 2020-10-27 MED ORDER — NOVOFINE PLUS PEN NEEDLE 32G X 4 MM MISC: 1 refills | Status: DC

## 2020-10-27 NOTE — Addendum Note (Signed)
Addended by: Scharlene Gloss B on: 10/27/2020 07:59 AM   Modules accepted: Orders

## 2020-10-27 NOTE — Progress Notes (Signed)
Subjective:   Chief Complaint  Patient presents with   Follow-up    John [name] is a 55 y.o. male here for follow-up of diabetes.   [place] self monitored glucose range is low-mid 100's.  Patient denies hypoglycemic reactions. He checks his glucose levels 2 time(s) per day. Patient does not require insulin.   Medications include: glyburide 10 mg bid, metformin XR 1000 mg bid,  Diet is healthy overall.  Exercise: active at work No CP or SOB.  Hypertension Patient presents for hypertension follow up. He does not monitor home blood pressures. He is compliant with medications-Coreg 25 mg twice daily, amlodipine 5 mg daily, chlorthalidone 25 mg daily, lisinopril 20 mg daily. Patient has these side effects of medication: none Diet/exercise as above.   Past Medical History:  Diagnosis Date   Asthma    Diabetes mellitus without complication (HCC)    History of chicken pox    History of DVT (deep vein thrombosis)    s/p trauma of R leg -- 2014. No other history of DVT   History of kidney stones    Hypertension    Lazy eye of left side      Related testing: Retinal exam: Done Pneumovax: done  Objective:  BP (!) 152/92   Pulse 88   Temp (!) 97 F (36.1 C) (Oral)   Ht 5\' 11"  (1.803 m)   Wt 181 lb (82.1 kg)   SpO2 98%   BMI 25.24 kg/m  General:  Well developed, well nourished, in no apparent distress Skin:  Warm, no pallor or diaphoresis Head:  Normocephalic, atraumatic Lungs:  CTAB, no access msc use Cardio:  RRR, no bruits, no LE edema Psych: Age appropriate judgment and insight  Assessment:   Type 2 diabetes mellitus with hyperglycemia, with long-term current use of insulin (HCC) - Plan: Basic metabolic panel, Hemoglobin A1c  Primary hypertension   Plan:   Chronic, uncontrolled. Cont Tresiba 12 u qhs, glyburide 10 mg bid, metformin XR 1000 mg bid. Cont to monitor sugars. May need a new monitor if this is not controlled/improved. Counseled on diet and  exercise. Chronic, uncontrolled. Monitor BP at home starting now. He had been controlled before. Cont Coreg 25 mg bid, Norvasc 5 mg/d, lisinopril 20 mg/d, chlorthalidone 25 mg/d. Would consider increasing dosage of Norvasc and/or lisinopril vs adding spironolactone if not controlled. I will plan to see him in 1 mo if not controlled.  F/u in 3-6  mo pending lab results. The patient voiced understanding and agreement to the plan.  [place], DO 10/27/20 7:15 AM

## 2020-10-27 NOTE — Patient Instructions (Addendum)
Give [place] 2-3 business days to get the results of your labs back. Our follow up will be based on the results.   Keep the diet clean and stay active.  Check your blood pressures 2-3 times per week, alternating the time of day you check it. If it is high, considering waiting 1-2 minutes and rechecking. If it gets higher, your anxiety is likely creeping up and we should avoid rechecking. If >140/90 consistently, I want to see you in a month.   I recommend getting the flu shot in mid October. This suggestion would change if the CDC comes out with a different recommendation.   The new Shingrix vaccine (for shingles) is a 2 shot series. It can make people feel low energy, achy and almost like they have the flu for 48 hours after injection. Please plan accordingly when deciding on when to get this shot. Call our office for a nurse visit appointment to get this. The second shot of the series is less severe regarding the side effects, but it still lasts 48 hours.   Let us know if you need anything.

## 2020-11-20 ENCOUNTER — Ambulatory Visit: Payer: BC Managed Care – PPO | Admitting: Podiatry

## 2020-12-18 ENCOUNTER — Ambulatory Visit: Payer: BC Managed Care – PPO | Admitting: Family Medicine

## 2021-01-27 ENCOUNTER — Ambulatory Visit
Admission: EM | Admit: 2021-01-27 | Discharge: 2021-01-27 | Disposition: A | Discharge status: Home / Self Care | Payer: BC Managed Care – PPO | Attending: Internal Medicine | Admitting: Internal Medicine

## 2021-01-27 ENCOUNTER — Ambulatory Visit: Payer: BC Managed Care – PPO

## 2021-01-27 ENCOUNTER — Other Ambulatory Visit: Payer: Self-pay

## 2021-01-27 DIAGNOSIS — H6123 Impacted cerumen, bilateral: Secondary | ICD-10-CM

## 2021-01-27 DIAGNOSIS — H60391 Other infective otitis externa, right ear: Secondary | ICD-10-CM

## 2021-01-27 DIAGNOSIS — H65191 Other acute nonsuppurative otitis media, right ear: Secondary | ICD-10-CM

## 2021-01-27 DIAGNOSIS — I10 Essential (primary) hypertension: Secondary | ICD-10-CM

## 2021-01-27 MED ORDER — OFLOXACIN 0.3 % OT SOLN: 10.0000 [drp] | Freq: Every day | OTIC | 0 refills | Status: AC

## 2021-01-27 MED ORDER — AMOXICILLIN 875 MG PO TABS: 875.0000 mg | ORAL_TABLET | Freq: Two times a day (BID) | ORAL | 0 refills | Status: AC

## 2021-01-27 MED ORDER — CHLORTHALIDONE 25 MG PO TABS: 25.0000 mg | ORAL_TABLET | Freq: Every day | ORAL | 0 refills | Status: DC

## 2021-01-27 NOTE — Discharge Instructions (Signed)
Your ears have been cleaned out and you have ear infection of the right ear.  You have been prescribed 2 medications to help treat this.  Your chlorthalidone has also been refilled.  Please follow-up with primary care for further evaluation management of high blood pressure.

## 2021-01-27 NOTE — ED Provider Notes (Signed)
John [name]    CSN: [id number] Arrival date & time: 01/27/21  6195      History   Chief Complaint Chief Complaint  Patient presents with   right ear situation    HPI John [name] is a 55 y.o. male.   Patient presents with right ear discomfort and states that it feels like his ear is "clogged up".  Denies any pain, drainage, decreased hearing.  Denies any fever or upper respiratory symptoms.  Denies any trauma to the ear.  Also has elevated blood pressure reading today in urgent [name].  Patient reports that he does take several blood pressure medications but has not taken his blood pressure medicine in approximately 2 days.  Denies any chest pain, shortness of breath, headache, blurred vision, dizziness, nausea, vomiting.  Patient requesting refill on chlorthalidone blood pressure medication.    Past Medical History:  Diagnosis Date   Asthma    Diabetes mellitus without complication ([name])    History of chicken pox    History of DVT (deep vein thrombosis)    s/p trauma of R leg -- 2014. No other history of DVT   History of kidney stones    Hypertension    Lazy eye of left side     Patient Active Problem List   Diagnosis Date Noted   Diabetes mellitus due to underlying condition with diabetic autonomic neuropathy, unspecified whether long term insulin use ([name]) 07/24/2020   Hyperlipidemia LDL goal <100 12/06/2015   Hypertension 04/06/2012   Type 2 diabetes mellitus with hyperglycemia, with long-term current use of insulin ([name]) 03/20/2012   History of DVT (deep vein thrombosis) 03/19/2012    Past Surgical History:  Procedure Laterality Date   EYE SURGERY         Home Medications    Prior to Admission medications   Medication Sig Start Date End Date Taking? Authorizing Provider  amoxicillin (AMOXIL) 875 MG tablet Take 1 tablet (875 mg total) by mouth 2 (two) times daily for 7 days. 01/27/21 02/03/21 Yes Nilah Belcourt, Michele Rockers, FNP  ofloxacin (FLOXIN) 0.3 % OTIC  solution Place 10 drops into the right ear daily for 7 days. 01/27/21 02/03/21 Yes [name], Michele Rockers, FNP  albuterol (VENTOLIN HFA) 108 (90 Base) MCG/ACT inhaler Inhale 2 puffs into the lungs every 6 (six) hours as needed for wheezing or shortness of breath. 10/21/19   Wendling, [name], DO  amLODipine (NORVASC) 5 MG tablet Take 1 tablet (5 mg total) by mouth daily. 07/18/20   [name], DO  aspirin 81 MG chewable tablet Chew 81 mg by mouth daily.    [provider]  atorvastatin (LIPITOR) 20 MG tablet Take 1 tablet (20 mg total) by mouth daily. 07/18/20   [name], DO  blood glucose meter kit and supplies KIT Dispense based on patient and insurance preference. Use up to four times daily as directed. (FOR ICD-9 250.00, 250.01). 10/18/15   [name], PA-C  carvedilol (COREG) 25 MG tablet Take 1 tablet (25 mg total) by mouth 2 (two) times daily with a meal. 07/18/20   Wendling, [name], DO  chlorthalidone (HYGROTON) 25 MG tablet Take 1 tablet (25 mg total) by mouth daily. 01/27/21   [name], FNP  gentamicin cream (GARAMYCIN) 0.1 % Apply 1 application topically 2 (two) times daily. 09/18/20   [name], DPM  glucose blood test strip Use as instructed, Check blood sugars twice daily 05/09/17   [name], PA-C  glyBURIDE (DIABETA) 5 MG tablet Take 2 tabs twice daily. 08/25/20   [name], DO  insulin degludec (TRESIBA FLEXTOUCH) 100 UNIT/ML FlexTouch Pen Inject 16 Units into the skin at bedtime. 10/27/20   [name], DO  Insulin Pen Needle (NOVOFINE PLUS PEN NEEDLE) 32G X 4 MM MISC Use daily to insulin 10/27/20   Wendling, [name], DO  Lancets [facility] ULTRASOFT) lancets Use as instructed, Check blood sugars twice daily 05/09/17   [name], PA-C  levocetirizine (XYZAL) 5 MG tablet Take 1 tablet (5 mg total) by mouth every evening. 12/01/19   [name], DO  lisinopril (ZESTRIL) 20 MG tablet  Take 1 tablet (20 mg total) by mouth daily. 07/18/20   [name], DO  metFORMIN (GLUCOPHAGE-XR) 500 MG 24 hr tablet Take 2 tablets (1,000 mg total) by mouth in the morning and at bedtime. 07/18/20   [name], DO  Multiple Vitamins-Minerals (MENS MULTIVITAMIN PLUS) TABS Take by mouth daily.    [provider]  pantoprazole (PROTONIX) 40 MG tablet Take 1 tablet (40 mg total) by mouth daily. 08/25/20   [name], DO  Prednisolon-Gatiflox-Bromfenac 1-0.5-0.075 % SUSP Place 1 drop into the right eye 4 (four) times daily. 08/04/19   [provider]    Family History Family History  Problem Relation Age of Onset   Heart disease Mother 68   Arthritis Mother    Heart disease Father 38   Stroke Father    Healthy Brother    Heart disease Maternal Grandmother    Diabetes Maternal Uncle     Social History Social History   Tobacco Use   Smoking status: Never   Smokeless tobacco: Never  Substance Use Topics   Alcohol use: No   Drug use: No     Allergies   Patient has no known allergies.   Review of Systems Review of Systems Per HPI  Physical Exam Triage Vital Signs ED Triage Vitals  Enc Vitals Group     BP 01/27/21 1007 (!) 206/125     Pulse Rate 01/27/21 1007 (!) 101     Resp 01/27/21 1007 18     Temp 01/27/21 1007 98 F (36.7 C)     Temp Source 01/27/21 1007 Oral     SpO2 01/27/21 1007 98 %     Weight --      Height --      Head Circumference --      Peak Flow --      Pain Score 01/27/21 1008 5     Pain Loc --      Pain Edu? --      Excl. in [place]? --    No data found.  Updated Vital Signs BP (!) 177/121 (BP Location: Right Arm)   Pulse (!) 101   Temp 98 F (36.7 C) (Oral)   Resp 18   SpO2 98%   Visual Acuity Right Eye Distance:   Left Eye Distance:   Bilateral Distance:    Right Eye Near:   Left Eye Near:    Bilateral Near:     Physical Exam Constitutional:      General: He is not in acute  distress.    Appearance: Normal appearance. He is not toxic-appearing or diaphoretic.  HENT:     Head: Normocephalic and atraumatic.     Right Ear: Ear canal and external ear normal. No decreased hearing noted. No drainage, swelling or tenderness. There is impacted cerumen.  Tympanic membrane is erythematous. Tympanic membrane is not perforated or bulging.     Left Ear: Ear canal and external ear normal. No decreased hearing noted. No drainage, swelling or tenderness. There is impacted cerumen. Tympanic membrane is not perforated, erythematous or bulging.     Nose: Nose normal.     Mouth/Throat:     Lips: Pink.     Mouth: Mucous membranes are moist.     Pharynx: Oropharynx is clear. No posterior oropharyngeal erythema.     Comments: Impacted cerumen bilaterally on initial exam.  Patient has erythematous right tympanic membrane and erythematous external canal of right ear on his second physical exam once ears were cleaned out. Eyes:     Extraocular Movements: Extraocular movements intact.     Conjunctiva/sclera: Conjunctivae normal.     Pupils: Pupils are equal, round, and reactive to light.  Cardiovascular:     Rate and Rhythm: Normal rate and regular rhythm.     Pulses: Normal pulses.     Heart sounds: Normal heart sounds.  Pulmonary:     Effort: Pulmonary effort is normal. No respiratory distress.     Breath sounds: Normal breath sounds.  Neurological:     General: No focal deficit present.     Mental Status: He is alert and oriented to person, place, and time. Mental status is at baseline.     Cranial Nerves: Cranial nerves 2-12 are intact.     Sensory: Sensation is intact.     Motor: Motor function is intact.     Coordination: Coordination is intact.     Gait: Gait is intact.     Comments: Patient has irregular pupil to the left eye.  Patient reports that this is due to trauma when he was a child and that this is normal.  Psychiatric:        Mood and Affect: Mood normal.         Behavior: Behavior normal.        Thought Content: Thought content normal.        Judgment: Judgment normal.     UC Treatments / Results  Labs (all labs ordered are listed, but only abnormal results are displayed) Labs Reviewed - No data to display  EKG   Radiology No results found.  Procedures Procedures (including critical [name] time)  Medications Ordered in UC Medications - No data to display  Initial Impression / Assessment and Plan / UC Course  I have reviewed the triage vital signs and the nursing notes.  Pertinent labs & imaging results that were available during my [name] of the patient were reviewed by me and considered in my medical decision making (see chart for details).     Cerumen removal completed by bilateral ear irrigation.  On second physical exam after cerumen removal, patient had right otitis media and right eye externa.  Will treat with ofloxacin eardrops and amoxicillin antibiotic x7 days.  Patient had elevated blood pressure reading in urgent [name] today, although patient has not taken his blood pressure medication in 2 to 3 days.  Advised patient of the importance of restarting medications as soon as possible.  No signs of endorgan damage on exam and neuro exam was normal.  Recheck on blood pressure had decreased to 161 systolic.  Will refill chlorthalidone for 30 days per patient request. He states that he has other BP meds at home.  Patient was advised to follow-up with PCP as soon as possible for further evaluation and management of  HTN and to manage refills on medications.  Patient advised to monitor blood pressure at home with home cuff at least twice daily and to go to the hospital if blood pressure remains elevated.  No signs of hypertensive urgency on exam.  Discussed strict return precautions.  Patient verbalized understanding and was agreeable plan. Final Clinical Impressions(s) / UC Diagnoses   Final diagnoses:  Impacted cerumen of both ears  Other  non-recurrent acute nonsuppurative otitis media of right ear  Other infective acute otitis externa of right ear     Discharge Instructions      Your ears have been cleaned out and you have ear infection of the right ear.  You have been prescribed 2 medications to help treat this.  Your chlorthalidone has also been refilled.  Please follow-up with primary [name] for further evaluation management of high blood pressure.     ED Prescriptions     Medication Sig Dispense Auth. Provider   chlorthalidone (HYGROTON) 25 MG tablet Take 1 tablet (25 mg total) by mouth daily. 30 tablet [place], [place] E, [place]   amoxicillin (AMOXIL) 875 MG tablet Take 1 tablet (875 mg total) by mouth 2 (two) times daily for 7 days. 14 tablet [place], [place] E, [place]   ofloxacin (FLOXIN) 0.3 % OTIC solution Place 10 drops into the right ear daily for 7 days. 5 mL [name], [place]      PDMP not reviewed this encounter.   [name], [place] 01/27/21 1123

## 2021-01-27 NOTE — ED Triage Notes (Signed)
Pt c/o "wax buildup" in right ear first noticed Monday. States it is painful, and sounds "like I'm in a fog."   Denies drainage, sore throat, cough, headache.

## 2021-01-31 ENCOUNTER — Ambulatory Visit: Payer: BC Managed Care – PPO | Admitting: Family Medicine

## 2021-02-06 ENCOUNTER — Ambulatory Visit: Payer: BC Managed Care – PPO | Admitting: Family Medicine

## 2021-02-06 ENCOUNTER — Encounter: Payer: Self-pay | Admitting: Family Medicine

## 2021-02-26 ENCOUNTER — Ambulatory Visit (INDEPENDENT_AMBULATORY_CARE_PROVIDER_SITE_OTHER): Payer: BC Managed Care – PPO

## 2021-02-26 ENCOUNTER — Other Ambulatory Visit: Payer: Self-pay

## 2021-02-26 ENCOUNTER — Ambulatory Visit: Payer: BC Managed Care – PPO | Admitting: Podiatry

## 2021-02-26 DIAGNOSIS — M2012 Hallux valgus (acquired), left foot: Secondary | ICD-10-CM

## 2021-02-26 DIAGNOSIS — L989 Disorder of the skin and subcutaneous tissue, unspecified: Secondary | ICD-10-CM

## 2021-02-26 DIAGNOSIS — L97512 Non-pressure chronic ulcer of other part of right foot with fat layer exposed: Secondary | ICD-10-CM

## 2021-02-26 DIAGNOSIS — M2042 Other hammer toe(s) (acquired), left foot: Secondary | ICD-10-CM

## 2021-02-26 DIAGNOSIS — M79675 Pain in left toe(s): Secondary | ICD-10-CM | POA: Diagnosis not present

## 2021-02-26 DIAGNOSIS — L97522 Non-pressure chronic ulcer of other part of left foot with fat layer exposed: Secondary | ICD-10-CM

## 2021-02-26 DIAGNOSIS — E0843 Diabetes mellitus due to underlying condition with diabetic autonomic (poly)neuropathy: Secondary | ICD-10-CM | POA: Diagnosis not present

## 2021-02-26 DIAGNOSIS — B351 Tinea unguium: Secondary | ICD-10-CM | POA: Diagnosis not present

## 2021-02-26 DIAGNOSIS — M79674 Pain in right toe(s): Secondary | ICD-10-CM | POA: Diagnosis not present

## 2021-02-26 MED ORDER — GENTAMICIN SULFATE 0.1 % EX CREA: 1.0000 "application " | TOPICAL_CREAM | Freq: Two times a day (BID) | CUTANEOUS | 1 refills | Status: DC

## 2021-02-26 MED ORDER — DOXYCYCLINE HYCLATE 100 MG PO TABS: 100.0000 mg | ORAL_TABLET | Freq: Two times a day (BID) | ORAL | 0 refills | Status: DC

## 2021-02-26 NOTE — Progress Notes (Signed)
G

## 2021-02-26 NOTE — Progress Notes (Signed)
Subjective:  55 y.o. male with PMHx of diabetes mellitus presenting to the office today for evaluation of symptomatic calluses and ulcers that developed to the patient's bilateral feet.  He was last seen in the office on 10/16/2020 for the same condition.  He says the calluses have gotten significantly painful and increased in size.  He now has a wound on his left second toe.  He presents for further treatment evaluation  Patient also complains of elongated thickened dystrophic toenails 1-5 bilateral   Past Medical History:  Diagnosis Date   Asthma    Diabetes mellitus without complication (HCC)    History of chicken pox    History of DVT (deep vein thrombosis)    s/p trauma of R leg -- 2014. No other history of DVT   History of kidney stones    Hypertension    Lazy eye of left side          Objective/Physical Exam General: The patient is alert and oriented x3 in no acute distress.  Dermatology:  Wound #1 noted to the left second toe measuring approximately 0.5x0.7 x 0.2 cm (LxWxD).   Wound #2 noted to the plantar aspect of the right great toe measuring approximately 1.5x1.5 x 0.1 with a hypergranular central portion of the wound  To the noted ulceration(s), there is no eschar. There is a moderate amount of slough, fibrin, and necrotic tissue noted. Granulation tissue and wound base is red. There is a minimal amount of serosanguineous drainage noted. There is no exposed bone muscle-tendon ligament or joint. There is no malodor. Periwound integrity is intact.  There is also significant amount of hyperkeratotic preulcerative callus tissue noted to the bilateral feet and weightbearing surfaces of the feet.  There is also elongated thickened dystrophic nails 1-5 bilateral  Vascular: Palpable pedal pulses bilaterally. No edema or erythema noted. Capillary refill within normal limits.  Neurological: Epicritic and protective threshold diminished bilaterally.   Musculoskeletal Exam:  Range of motion within normal limits to all pedal and ankle joints bilateral. Muscle strength 5/5 in all groups bilateral.  Hallux valgus deformity noted left foot with hammertoe contracture and overlapping of the second digit  Radiographic exam: Normal osseous mineralization.  There may be some slight periosteal reaction to the medial aspect of the PIPJ of the left second toe.  No clear evidence of osteomyelitis or cortical erosion.  Joint spaces mostly preserved.  Assessment: 1.  Ulcers left second toe and right great toe secondary to diabetes mellitus 2. diabetes mellitus w/ peripheral neuropathy 3.  Hallux valgus left 4.  Hammertoe second left 5.  Multiple hyperkeratotic preulcerative callus tissue bilateral feet 6.  Pain due to onychomycosis of toenails bilateral   Plan of Care:  1. Patient was evaluated. 2. medically necessary excisional debridement including subcutaneous tissue was performed using a tissue nipper and a chisel blade. Excisional debridement of all the necrotic nonviable tissue down to healthy bleeding viable tissue was performed with post-debridement measurements same as pre-. 3. the wound was cleansed and dry sterile dressing applied. 4.  Prescription for gentamicin cream applied daily  5.  Prescription for doxycycline 100 mg 2 times daily  6.  Cultures taken and sent to pathology left second toe  7.  Postsurgical shoes dispensed.  Weightbearing as tolerated  8.  We did discuss today the possibility of surgery to correct for the hallux valgus and hammertoe deformities if these wounds heal.  Correction of the hallux valgus and hammertoes would likely improve the  patient's calluses and possibly prevent recurrent ulcers from developing which could lead to possible amputation  9.  Excisional debridement of the hyperkeratotic preulcerative callus lesions was performed using 312 scalpel without incident or bleeding  10.  Mechanical debridement of nails 1-5 bilateral was  performed using a nail nipper without incident or bleeding  11.  Patient is to return to clinic in 3 weeks   Felecia Shelling, DPM [facility]  Dr. Felecia Shelling, DPM    [address]                                        [place], [place] [address]                Office [phone]  Fax [phone]

## 2021-02-26 NOTE — Addendum Note (Signed)
Addended by: Francesca Oman on: 02/26/2021 11:01 AM   Modules accepted: Orders

## 2021-03-01 LAB — WOUND CULTURE
MICRO NUMBER:: 12714511
SPECIMEN QUALITY:: ADEQUATE

## 2021-03-12 ENCOUNTER — Other Ambulatory Visit: Payer: Self-pay

## 2021-03-12 ENCOUNTER — Ambulatory Visit: Payer: BC Managed Care – PPO | Admitting: Podiatry

## 2021-03-12 DIAGNOSIS — L97522 Non-pressure chronic ulcer of other part of left foot with fat layer exposed: Secondary | ICD-10-CM

## 2021-03-12 DIAGNOSIS — L97512 Non-pressure chronic ulcer of other part of right foot with fat layer exposed: Secondary | ICD-10-CM

## 2021-03-12 DIAGNOSIS — E0843 Diabetes mellitus due to underlying condition with diabetic autonomic (poly)neuropathy: Secondary | ICD-10-CM

## 2021-03-12 MED ORDER — GENTAMICIN SULFATE 0.1 % EX CREA: 1.0000 "application " | TOPICAL_CREAM | Freq: Two times a day (BID) | CUTANEOUS | 1 refills | Status: DC

## 2021-03-12 MED ORDER — LEVOFLOXACIN 750 MG PO TABS: 750.0000 mg | ORAL_TABLET | Freq: Every day | ORAL | 0 refills | Status: DC

## 2021-03-12 NOTE — Progress Notes (Signed)
Subjective:  55 y.o. male with PMHx of diabetes mellitus presenting to the office today for follow-up evaluation of diabetic foot ulcers to the left second toe and right great toe.  Patient states that he is using the gentamicin cream daily.  He is still taking the doxycycline and cleaning the wound every day.  He has been wearing the postsurgical shoe as instructed.  No new complaints at this time  Past Medical History:  Diagnosis Date   Asthma    Diabetes mellitus without complication (HCC)    History of chicken pox    History of DVT (deep vein thrombosis)    s/p trauma of R leg -- 2014. No other history of DVT   History of kidney stones    Hypertension    Lazy eye of left side      Objective/Physical Exam General: The patient is alert and oriented x3 in no acute distress.  Dermatology:  Wound #1 noted to the left second toe measuring approximately 0.5x0.7 x 0.2 cm (LxWxD).  Mostly unchanged since last visit  Wound #2 noted to the plantar aspect of the right great toe measuring approximately 1.5x1.5 x 0.1 centimeters.  To the noted ulceration(s), there is no eschar. There is a moderate amount of slough, fibrin, and necrotic tissue noted. Granulation tissue and wound base is red. There is a minimal amount of serosanguineous drainage noted. There is no exposed bone muscle-tendon ligament or joint. There is no malodor. Periwound integrity is intact.  There continues to be a significant amount of hyperkeratotic preulcerative callus tissue noted to the bilateral feet and weightbearing surfaces of the feet.  Vascular: Palpable pedal pulses bilaterally. No edema or erythema noted. Capillary refill within normal limits.  Neurological: Epicritic and protective threshold diminished bilaterally.   Musculoskeletal Exam: Range of motion within normal limits to all pedal and ankle joints bilateral. Muscle strength 5/5 in all groups bilateral.  Hallux valgus deformity noted left foot with  hammertoe contracture and overlapping of the second digit which is likely contributing to the ulcer to the medial aspect of the second toe as well as the plantar aspect of the great toe  Assessment: 1.  Ulcers left second toe and right great toe secondary to diabetes mellitus 2. diabetes mellitus w/ peripheral neuropathy 3.  Hallux valgus left 4.  Hammertoe second left 5.  Multiple hyperkeratotic preulcerative callus tissue bilateral feet 6.  Pain due to onychomycosis of toenails bilateral   Plan of Care:  1. Patient was evaluated. 2. medically necessary excisional debridement including subcutaneous tissue was performed using a tissue nipper and a chisel blade. Excisional debridement of all the necrotic nonviable tissue down to healthy bleeding viable tissue was performed with post-debridement measurements same as pre-. 3. the wound was cleansed and dry sterile dressing applied. 4.  Continue the gentamicin cream daily.  Refill prescription provided 5.  Prescription for Levaquin 750 mg daily based on cultures taken last visit 6.  Appointment with Pedorthist for diabetic shoes and insoles to offload pressure from the foot 7.  Patient's last A1c was 9.0 on 10/27/2020.  Discussed the importance of maintaining control of his diabetes and working closely with his PCP.  Patient understands. 8.  Patient states that he has a follow-up appointment with his PCP in approximately 2 weeks.  Return to clinic after PCP follow-up for further management and follow-up x-ray to compare with x-rays taken 02/27/2019.  Felecia Shelling, DPM [facility]  Dr. Felecia Shelling,  DPM    [address]                                        [place], [place] [address]                Office [phone]  Fax [phone]

## 2021-03-14 ENCOUNTER — Other Ambulatory Visit: Payer: Self-pay | Admitting: Family Medicine

## 2021-03-14 DIAGNOSIS — E119 Type 2 diabetes mellitus without complications: Secondary | ICD-10-CM

## 2021-03-14 MED ORDER — ATORVASTATIN CALCIUM 20 MG PO TABS: 20.0000 mg | ORAL_TABLET | Freq: Every day | ORAL | 3 refills | Status: DC

## 2021-04-26 ENCOUNTER — Other Ambulatory Visit: Payer: Self-pay

## 2021-04-26 ENCOUNTER — Emergency Department (HOSPITAL_BASED_OUTPATIENT_CLINIC_OR_DEPARTMENT_OTHER): Payer: BC Managed Care – PPO

## 2021-04-26 ENCOUNTER — Ambulatory Visit: Payer: BC Managed Care – PPO

## 2021-04-26 ENCOUNTER — Emergency Department (HOSPITAL_BASED_OUTPATIENT_CLINIC_OR_DEPARTMENT_OTHER)
Admission: EM | Admit: 2021-04-26 | Discharge: 2021-04-26 | Disposition: A | Discharge status: Home / Self Care | Payer: BC Managed Care – PPO | Attending: Emergency Medicine | Admitting: Emergency Medicine

## 2021-04-26 ENCOUNTER — Ambulatory Visit
Admission: RE | Admit: 2021-04-26 | Discharge: 2021-04-26 | Disposition: A | Discharge status: Home / Self Care | Payer: BC Managed Care – PPO | Source: Ambulatory Visit | Attending: Family Medicine | Admitting: Family Medicine

## 2021-04-26 ENCOUNTER — Encounter (HOSPITAL_BASED_OUTPATIENT_CLINIC_OR_DEPARTMENT_OTHER): Payer: Self-pay | Admitting: Emergency Medicine

## 2021-04-26 VITALS — BP 158/111 | HR 127 | Temp 97.9°F | Resp 18 | Ht 70.0 in | Wt 175.0 lb

## 2021-04-26 DIAGNOSIS — K3184 Gastroparesis: Secondary | ICD-10-CM | POA: Diagnosis not present

## 2021-04-26 DIAGNOSIS — R1013 Epigastric pain: Secondary | ICD-10-CM | POA: Diagnosis not present

## 2021-04-26 DIAGNOSIS — N179 Acute kidney failure, unspecified: Secondary | ICD-10-CM | POA: Insufficient documentation

## 2021-04-26 DIAGNOSIS — E1169 Type 2 diabetes mellitus with other specified complication: Secondary | ICD-10-CM

## 2021-04-26 DIAGNOSIS — E860 Dehydration: Secondary | ICD-10-CM | POA: Diagnosis not present

## 2021-04-26 DIAGNOSIS — Z7984 Long term (current) use of oral hypoglycemic drugs: Secondary | ICD-10-CM | POA: Diagnosis not present

## 2021-04-26 DIAGNOSIS — Z7982 Long term (current) use of aspirin: Secondary | ICD-10-CM | POA: Insufficient documentation

## 2021-04-26 DIAGNOSIS — R1012 Left upper quadrant pain: Secondary | ICD-10-CM

## 2021-04-26 DIAGNOSIS — E1143 Type 2 diabetes mellitus with diabetic autonomic (poly)neuropathy: Secondary | ICD-10-CM | POA: Diagnosis not present

## 2021-04-26 DIAGNOSIS — Z794 Long term (current) use of insulin: Secondary | ICD-10-CM | POA: Insufficient documentation

## 2021-04-26 DIAGNOSIS — Z0389 Encounter for observation for other suspected diseases and conditions ruled out: Secondary | ICD-10-CM | POA: Diagnosis not present

## 2021-04-26 LAB — URINALYSIS, ROUTINE W REFLEX MICROSCOPIC
Bilirubin Urine: NEGATIVE
Glucose, UA: >1000 mg/dL — AB
Hgb urine dipstick: NEGATIVE
Ketones, ur: 15 mg/dL — AB
Leukocytes,Ua: NEGATIVE
Nitrite: NEGATIVE
Protein, ur: 30 mg/dL — AB
Specific Gravity, Urine: 1.027 (ref 1.005–1.030)
pH: 7 (ref 5.0–8.0)

## 2021-04-26 LAB — I-STAT VENOUS BLOOD GAS, ED
Acid-Base Excess: 4 mmol/L — ABNORMAL HIGH (ref 0.0–2.0)
Bicarbonate: 28.6 mmol/L — ABNORMAL HIGH (ref 20.0–28.0)
Calcium, Ion: 1.19 mmol/L (ref 1.15–1.40)
HCT: 36 % — ABNORMAL LOW (ref 39.0–52.0)
Hemoglobin: 12.2 g/dL — ABNORMAL LOW (ref 13.0–17.0)
O2 Saturation: 55 %
Potassium: 3.9 mmol/L (ref 3.5–5.1)
Sodium: 134 mmol/L — ABNORMAL LOW (ref 135–145)
TCO2: 30 mmol/L (ref 22–32)
pCO2, Ven: 43.5 mmHg — ABNORMAL LOW (ref 44.0–60.0)
pH, Ven: 7.426 (ref 7.250–7.430)
pO2, Ven: 28 mmHg — CL (ref 32.0–45.0)

## 2021-04-26 LAB — COMPREHENSIVE METABOLIC PANEL
ALT: 9 U/L (ref 0–44)
AST: 17 U/L (ref 15–41)
Albumin: 4.4 g/dL (ref 3.5–5.0)
Alkaline Phosphatase: 85 U/L (ref 38–126)
Anion gap: 14 (ref 5–15)
BUN: 18 mg/dL (ref 6–20)
CO2: 27 mmol/L (ref 22–32)
Calcium: 10.7 mg/dL — ABNORMAL HIGH (ref 8.9–10.3)
Chloride: 91 mmol/L — ABNORMAL LOW (ref 98–111)
Creatinine, Ser: 1.56 mg/dL — ABNORMAL HIGH (ref 0.61–1.24)
GFR, Estimated: 52 mL/min — ABNORMAL LOW (ref >60)
Glucose, Bld: 376 mg/dL — ABNORMAL HIGH (ref 70–99)
Potassium: 4.4 mmol/L (ref 3.5–5.1)
Sodium: 132 mmol/L — ABNORMAL LOW (ref 135–145)
Total Bilirubin: 1.3 mg/dL — ABNORMAL HIGH (ref 0.3–1.2)
Total Protein: 9.4 g/dL — ABNORMAL HIGH (ref 6.5–8.1)

## 2021-04-26 LAB — CBC
HCT: 41.7 % (ref 39.0–52.0)
Hemoglobin: 14.1 g/dL (ref 13.0–17.0)
MCH: 29.9 pg (ref 26.0–34.0)
MCHC: 33.8 g/dL (ref 30.0–36.0)
MCV: 88.3 fL (ref 80.0–100.0)
Platelets: 225 10*3/uL (ref 150–400)
RBC: 4.72 MIL/uL (ref 4.22–5.81)
RDW: 11.9 % (ref 11.5–15.5)
WBC: 10.4 10*3/uL (ref 4.0–10.5)
nRBC: 0 % (ref 0.0–0.2)

## 2021-04-26 LAB — CBG MONITORING, ED: Glucose-Capillary: 358 mg/dL — ABNORMAL HIGH (ref 70–99)

## 2021-04-26 LAB — TROPONIN I (HIGH SENSITIVITY): Troponin I (High Sensitivity): 6 ng/L (ref <18)

## 2021-04-26 LAB — LIPASE, BLOOD: Lipase: 14 U/L (ref 11–51)

## 2021-04-26 MED ORDER — FAMOTIDINE IN NACL 20-0.9 MG/50ML-% IV SOLN
20.0000 mg | Freq: Once | INTRAVENOUS | Status: AC
  Administered 2021-04-26: 20 mg via INTRAVENOUS
  Filled 2021-04-26: qty 50

## 2021-04-26 MED ORDER — SODIUM CHLORIDE 0.9 % IV BOLUS
1000.0000 mL | Freq: Once | INTRAVENOUS | Status: AC
  Administered 2021-04-26: 1000 mL via INTRAVENOUS

## 2021-04-26 MED ORDER — ALUM & MAG HYDROXIDE-SIMETH 200-200-20 MG/5ML PO SUSP
15.0000 mL | Freq: Once | ORAL | Status: AC
Start: 2021-04-26 — End: 2021-04-26
  Administered 2021-04-26: 15 mL via ORAL
  Filled 2021-04-26: qty 30

## 2021-04-26 MED ORDER — METOPROLOL TARTRATE 5 MG/5ML IV SOLN
5.0000 mg | Freq: Once | INTRAVENOUS | Status: AC
  Administered 2021-04-26: 5 mg via INTRAVENOUS
  Filled 2021-04-26: qty 5

## 2021-04-26 MED ORDER — ONDANSETRON HCL 4 MG PO TABS: 4.0000 mg | ORAL_TABLET | Freq: Four times a day (QID) | ORAL | 0 refills | Status: DC

## 2021-04-26 MED ORDER — KETOROLAC TROMETHAMINE 15 MG/ML IJ SOLN
15.0000 mg | Freq: Once | INTRAMUSCULAR | Status: AC
  Administered 2021-04-26: 15 mg via INTRAVENOUS
  Filled 2021-04-26: qty 1

## 2021-04-26 MED ORDER — FAMOTIDINE 20 MG PO TABS: 20.0000 mg | ORAL_TABLET | Freq: Two times a day (BID) | ORAL | 0 refills | Status: DC

## 2021-04-26 MED ORDER — ONDANSETRON HCL 4 MG/2ML IJ SOLN
4.0000 mg | Freq: Once | INTRAMUSCULAR | Status: AC
  Administered 2021-04-26: 4 mg via INTRAVENOUS
  Filled 2021-04-26: qty 2

## 2021-04-26 MED ORDER — INSULIN ASPART 100 UNIT/ML IJ SOLN
10.0000 [IU] | Freq: Once | INTRAMUSCULAR | Status: AC
  Administered 2021-04-26: 10 [IU] via SUBCUTANEOUS

## 2021-04-26 NOTE — ED Provider Notes (Signed)
[place] EMERGENCY DEPT Provider Note   CSN: [id number] Arrival date & time: 04/26/21  7416     History  Chief Complaint  Patient presents with   Nausea   Heartburn   Abdominal Pain    John [name] is a 56 y.o. male.  Patient is a 56 yo male with pmh of DM presenting for nausea, vomiting, and epigastric abdominal pain since 5 days. Pt denies fevers or chills. Admits to heart burn. Denies chest pain or sob. Has not taken insulin or metformin six weeks.   The history is provided by the patient. No language interpreter was used.  Heartburn Associated symptoms include abdominal pain. Pertinent negatives include no chest pain and no shortness of breath.  Abdominal Pain Associated symptoms: no chest pain, no chills, no cough, no dysuria, no fever, no hematuria, no shortness of breath, no sore throat and no vomiting       Home Medications Prior to Admission medications   Medication Sig Start Date End Date Taking? Authorizing Provider  albuterol (VENTOLIN HFA) 108 (90 Base) MCG/ACT inhaler Inhale 2 puffs into the lungs every 6 (six) hours as needed for wheezing or shortness of breath. 10/21/19   Wendling, [name], DO  amLODipine (NORVASC) 5 MG tablet Take 1 tablet (5 mg total) by mouth daily. 07/18/20   [name], DO  aspirin 81 MG chewable tablet Chew 81 mg by mouth daily.    [provider]  atorvastatin (LIPITOR) 20 MG tablet Take 1 tablet (20 mg total) by mouth daily. 03/14/21   [name], DO  blood glucose meter kit and supplies KIT Dispense based on patient and insurance preference. Use up to four times daily as directed. (FOR ICD-9 250.00, 250.01). 10/18/15   [name], PA-C  carvedilol (COREG) 25 MG tablet Take 1 tablet (25 mg total) by mouth 2 (two) times daily with a meal. 07/18/20   Wendling, [name], DO  chlorthalidone (HYGROTON) 25 MG tablet Take 1 tablet (25 mg total) by mouth daily. 01/27/21   [name], FNP  doxycycline (VIBRA-TABS) 100 MG tablet Take 1 tablet (100 mg total) by mouth 2 (two) times daily. 02/26/21   [name], DPM  gentamicin cream (GARAMYCIN) 0.1 % Apply 1 application topically 2 (two) times daily. 03/12/21   [name], DPM  glucose blood test strip Use as instructed, Check blood sugars twice daily 05/09/17   [name], PA-C  glyBURIDE (DIABETA) 5 MG tablet Take 2 tabs twice daily. 08/25/20   [name], DO  insulin degludec (TRESIBA FLEXTOUCH) 100 UNIT/ML FlexTouch Pen Inject 16 Units into the skin at bedtime. 10/27/20   [name], DO  Insulin Pen Needle (NOVOFINE PLUS PEN NEEDLE) 32G X 4 MM MISC Use daily to insulin 10/27/20   Wendling, [name], DO  Lancets [facility] ULTRASOFT) lancets Use as instructed, Check blood sugars twice daily 05/09/17   [name], PA-C  levocetirizine (XYZAL) 5 MG tablet Take 1 tablet (5 mg total) by mouth every evening. 12/01/19   [name], DO  levofloxacin (LEVAQUIN) 750 MG tablet Take 1 tablet (750 mg total) by mouth daily. 03/12/21   [name], DPM  lisinopril (ZESTRIL) 20 MG tablet Take 1 tablet (20 mg total) by mouth daily. 07/18/20   [name], DO  metFORMIN (GLUCOPHAGE-XR) 500 MG 24 hr tablet Take 2 tablets (1,000 mg total) by mouth in the morning and at bedtime. 07/18/20  [name], DO  Multiple Vitamins-Minerals (MENS MULTIVITAMIN PLUS) TABS Take by mouth daily.    [provider]  pantoprazole (PROTONIX) 40 MG tablet Take 1 tablet (40 mg total) by mouth daily. 08/25/20   [name], DO  Prednisolon-Gatiflox-Bromfenac 1-0.5-0.075 % SUSP Place 1 drop into the right eye 4 (four) times daily. 08/04/19   [provider]      Allergies    Patient has no known allergies.    Review of Systems   Review of Systems  Constitutional:  Negative for chills and fever.  HENT:  Negative for ear pain and sore throat.   Eyes:   Negative for pain and visual disturbance.  Respiratory:  Negative for cough and shortness of breath.   Cardiovascular:  Negative for chest pain and palpitations.  Gastrointestinal:  Positive for abdominal pain and heartburn. Negative for vomiting.  Genitourinary:  Negative for dysuria and hematuria.  Musculoskeletal:  Negative for arthralgias and back pain.  Skin:  Negative for color change and rash.  Neurological:  Negative for seizures and syncope.  All other systems reviewed and are negative.  Physical Exam Updated Vital Signs BP (!) 165/108    Pulse (!) 108    Temp 98.7 F (37.1 C)    Resp 16    Ht _0  (1.778 m)    Wt 79.4 kg    SpO2 96%    BMI 25.12 kg/m  Physical Exam Vitals and nursing note reviewed.  Constitutional:      General: He is not in acute distress.    Appearance: He is well-developed.  HENT:     Head: Normocephalic and atraumatic.  Eyes:     Conjunctiva/sclera: Conjunctivae normal.  Cardiovascular:     Rate and Rhythm: Normal rate and regular rhythm.     Heart sounds: No murmur heard. Pulmonary:     Effort: Pulmonary effort is normal. No respiratory distress.     Breath sounds: Normal breath sounds.  Abdominal:     Palpations: Abdomen is soft.     Tenderness: There is abdominal tenderness in the epigastric area.  Musculoskeletal:        General: No swelling.     Cervical back: Neck supple.  Skin:    General: Skin is warm and dry.     Capillary Refill: Capillary refill takes less than 2 seconds.  Neurological:     Mental Status: He is alert.  Psychiatric:        Mood and Affect: Mood normal.    ED Results / Procedures / Treatments   Labs (all labs ordered are listed, but only abnormal results are displayed) Labs Reviewed  COMPREHENSIVE METABOLIC PANEL - Abnormal; Notable for the following components:      Result Value   Sodium 132 (*)    Chloride 91 (*)    Glucose, Bld 376 (*)    Creatinine, Ser 1.56 (*)    Calcium 10.7 (*)    Total Protein  9.4 (*)    Total Bilirubin 1.3 (*)    GFR, Estimated 52 (*)    All other components within normal limits  URINALYSIS, ROUTINE W REFLEX MICROSCOPIC - Abnormal; Notable for the following components:   Glucose, UA >1,000 (*)    Ketones, ur 15 (*)    Protein, ur 30 (*)    All other components within normal limits  LIPASE, BLOOD  CBC  TROPONIN I (HIGH SENSITIVITY)    EKG EKG Interpretation  Date/Time:  Thursday April 26 2021 10:24:37 EST Ventricular Rate:  125 PR Interval:  130 QRS Duration: 80 QT Interval:  326 QTC Calculation: 470 R Axis:   55 Text Interpretation: Sinus tachycardia Biatrial enlargement Minimal voltage criteria for LVH, may be normal variant ( Sokolow-Lyon ) Nonspecific ST abnormality Abnormal ECG No previous ECGs available Confirmed by Campbell Stall ([id number]) on 05/25/9922 11:36:09 AM  Radiology No results found.  Procedures Procedures    Medications Ordered in ED Medications  alum & mag hydroxide-simeth (MAALOX/MYLANTA) 200-200-20 MG/5ML suspension 15 mL (has no administration in time range)  famotidine (PEPCID) IVPB 20 mg premix (has no administration in time range)  ketorolac (TORADOL) 15 MG/ML injection 15 mg (has no administration in time range)  ondansetron (ZOFRAN) injection 4 mg (has no administration in time range)  metoprolol tartrate (LOPRESSOR) injection 5 mg (has no administration in time range)  sodium chloride 0.9 % bolus 1,000 mL (has no administration in time range)    ED Course/ Medical Decision Making/ A&P                           Medical Decision Making Amount and/or Complexity of Data Reviewed Labs: ordered. Radiology: ordered.  Risk OTC drugs. Prescription drug management.   11:44 AM  56 yo male with pmh of DM presenting for nausea, vomiting, and epigastric abdominal pain since 5 days. Pt is Aox3, no acute distress, afebrile, with stable vitals. Physicial exam demonstrates tenderness to epigastric region. Pepcid, maalox,  zofran, and iv fluids given.  Laboratory studies demonstrates hyperglycemia-likely due to pt have DM and not taking his insulin for weeks. No DKA. Insulin 10 U subq given. Otherwise stable liver profile and lipase. AKI likely secondary to dehydration. CT abdomen demonstrates no acute process.   Patient in no distress and overall condition improved here in the ED. Patient's symptoms likely secondary to gastroparesis and hyperglycemia. Social work consult placed due to patient difficulty obtaining medications.    Detailed discussions were had with the patient regarding current findings, and need for close f/u with PCP or on call doctor. The patient has been instructed to return immediately if the symptoms worsen in any way for re-evaluation. Patient verbalized understanding and is in agreement with current care plan. All questions answered prior to discharge.         Final Clinical Impression(s) / ED Diagnoses Final diagnoses:  Type 2 diabetes mellitus with other specified complication, with long-term current use of insulin ([place])  Dehydration  AKI (acute kidney injury) ([place])  Diabetic gastroparesis ([place])    Rx / DC Orders ED Discharge Orders     None         Lianne Cure, DO 26/83/41 [id number]

## 2021-04-26 NOTE — ED Triage Notes (Signed)
Patient c/o heartburn, dizziness and "knots in stomach" x 4 days.  Patient has had some nausea and vomiting.  Patient has taken Pepto Bismol and Omeprazole w/o much relief.

## 2021-04-26 NOTE — Discharge Instructions (Addendum)
Your labs today showed evidence of kidney injury likely secondary to dehydration.   Sometimes when your blood sugar is too high this can cause nausea, vomiting, and abdominal pain. Please make sure to take your insulin so that you do not have any life threatening results of elevated blood sugar.   Stay hydrated

## 2021-04-26 NOTE — ED Triage Notes (Signed)
Patient arrives via POV for complaints of heartburn, nausea, and abdominal pain x4 days. The patient took some otc meds with minimal relief. Went to urgent care this morning and they recommended he come here for additional testing.

## 2021-04-26 NOTE — ED Provider Notes (Signed)
[facility]    CSN: [id number] Arrival date & time: 04/26/21  0856      History   Chief Complaint Chief Complaint  Patient presents with   Appointment    Heartburn/Dizziness    HPI John [name] is a 56 y.o. male.   Patient presents with abdominal pain, nausea, vomiting, diarrhea, dizziness that have been present for approximately 4 days.  Patient attributes this to heartburn as it feels slightly similar to heartburn that he has had in the past.  Pain is located in the left upper quadrant and is constant.  Pain is currently 4/10 on pain scale but has been more severe over the past few days.  States that he has taken Pepto-Bismol and omeprazole with not much relief which typically eradicates his heartburn.  Also has some occasional dizziness.  Denies chest pain or shortness of breath.  No aggravating or relieving factors to pain.  No blood in stool or emesis.    Past Medical History:  Diagnosis Date   Asthma    Diabetes mellitus without complication ([place])    History of chicken pox    History of DVT (deep vein thrombosis)    s/p trauma of R leg -- 2014. No other history of DVT   History of kidney stones    Hypertension    Lazy eye of left side     Patient Active Problem List   Diagnosis Date Noted   Diabetes mellitus due to underlying condition with diabetic autonomic neuropathy, unspecified whether long term insulin use ([place]) 07/24/2020   Hyperlipidemia LDL goal <100 12/06/2015   Hypertension 04/06/2012   Type 2 diabetes mellitus with hyperglycemia, with long-term current use of insulin ([place]) 03/20/2012   History of DVT (deep vein thrombosis) 03/19/2012    Past Surgical History:  Procedure Laterality Date   EYE SURGERY         Home Medications    Prior to Admission medications   Medication Sig Start Date End Date Taking? Authorizing Provider  albuterol (VENTOLIN HFA) 108 (90 Base) MCG/ACT inhaler Inhale 2 puffs into the lungs every 6 (six) hours as  needed for wheezing or shortness of breath. 10/21/19  Yes Wendling, [name], DO  amLODipine (NORVASC) 5 MG tablet Take 1 tablet (5 mg total) by mouth daily. 07/18/20  Yes [name], DO  aspirin 81 MG chewable tablet Chew 81 mg by mouth daily.   Yes [provider]  atorvastatin (LIPITOR) 20 MG tablet Take 1 tablet (20 mg total) by mouth daily. 03/14/21  Yes [name], DO  blood glucose meter kit and supplies KIT Dispense based on patient and insurance preference. Use up to four times daily as directed. (FOR ICD-9 250.00, 250.01). 10/18/15  Yes [name], PA-C  carvedilol (COREG) 25 MG tablet Take 1 tablet (25 mg total) by mouth 2 (two) times daily with a meal. 07/18/20  Yes Wendling, [name], DO  chlorthalidone (HYGROTON) 25 MG tablet Take 1 tablet (25 mg total) by mouth daily. 01/27/21  Yes Brennyn Ortlieb, Hildred Alamin E, FNP  gentamicin cream (GARAMYCIN) 0.1 % Apply 1 application topically 2 (two) times daily. 03/12/21  Yes [name], DPM  glucose blood test strip Use as instructed, Check blood sugars twice daily 05/09/17  Yes [name], PA-C  glyBURIDE (DIABETA) 5 MG tablet Take 2 tabs twice daily. 08/25/20  Yes Wendling, [name], DO  insulin degludec (TRESIBA FLEXTOUCH) 100 UNIT/ML FlexTouch Pen Inject 16 Units into the skin  at bedtime. 10/27/20  Yes [name], DO  Insulin Pen Needle (NOVOFINE PLUS PEN NEEDLE) 32G X 4 MM MISC Use daily to insulin 10/27/20  Yes Wendling, [name], DO  Lancets [facility] ULTRASOFT) lancets Use as instructed, Check blood sugars twice daily 05/09/17  Yes [name], PA-C  levocetirizine (XYZAL) 5 MG tablet Take 1 tablet (5 mg total) by mouth every evening. 12/01/19  Yes [name], DO  lisinopril (ZESTRIL) 20 MG tablet Take 1 tablet (20 mg total) by mouth daily. 07/18/20  Yes [name], DO  metFORMIN (GLUCOPHAGE-XR) 500 MG 24 hr tablet Take 2 tablets (1,000 mg total) by  mouth in the morning and at bedtime. 07/18/20  Yes Wendling, [name], DO  Multiple Vitamins-Minerals (MENS MULTIVITAMIN PLUS) TABS Take by mouth daily.   Yes [provider]  pantoprazole (PROTONIX) 40 MG tablet Take 1 tablet (40 mg total) by mouth daily. 08/25/20  Yes [name], DO  doxycycline (VIBRA-TABS) 100 MG tablet Take 1 tablet (100 mg total) by mouth 2 (two) times daily. 02/26/21   [name], DPM  levofloxacin (LEVAQUIN) 750 MG tablet Take 1 tablet (750 mg total) by mouth daily. 03/12/21   [name], DPM  Prednisolon-Gatiflox-Bromfenac 1-0.5-0.075 % SUSP Place 1 drop into the right eye 4 (four) times daily. 08/04/19   [provider]    Family History Family History  Problem Relation Age of Onset   Heart disease Mother 67   Arthritis Mother    Heart disease Father 47   Stroke Father    Healthy Brother    Heart disease Maternal Grandmother    Diabetes Maternal Uncle     Social History Social History   Tobacco Use   Smoking status: Never   Smokeless tobacco: Never  Substance Use Topics   Alcohol use: No   Drug use: No     Allergies   Patient has no known allergies.   Review of Systems Review of Systems Per HPI  Physical Exam Triage Vital Signs ED Triage Vitals  Enc Vitals Group     BP 04/26/21 0906 (!) 158/111     Pulse Rate 04/26/21 0906 (!) 127     Resp 04/26/21 0906 18     Temp 04/26/21 0906 97.9 F (36.6 C)     Temp Source 04/26/21 0906 Oral     SpO2 04/26/21 0906 98 %     Weight 04/26/21 0907 175 lb (79.4 kg)     Height 04/26/21 0907 _0  (1.778 m)     Head Circumference --      Peak Flow --      Pain Score 04/26/21 0907 2     Pain Loc --      Pain Edu? --      Excl. in [place]? --    No data found.  Updated Vital Signs BP (!) 158/111 (BP Location: Right Arm)    Pulse (!) 127    Temp 97.9 F (36.6 C) (Oral)    Resp 18    Ht _1  (1.778 m)    Wt 175 lb (79.4 kg)    SpO2 98%    BMI 25.11 kg/m    Visual Acuity Right Eye Distance:   Left Eye Distance:   Bilateral Distance:    Right Eye Near:   Left Eye Near:    Bilateral Near:     Physical Exam Constitutional:      General: He is not in  acute distress.    Appearance: Normal appearance. He is not toxic-appearing or diaphoretic.  HENT:     Head: Normocephalic and atraumatic.  Eyes:     Extraocular Movements: Extraocular movements intact.     Conjunctiva/sclera: Conjunctivae normal.  Cardiovascular:     Rate and Rhythm: Normal rate and regular rhythm.     Pulses: Normal pulses.     Heart sounds: Normal heart sounds.  Pulmonary:     Effort: Pulmonary effort is normal. No respiratory distress.     Breath sounds: Normal breath sounds.  Abdominal:     General: Abdomen is flat. Bowel sounds are normal. There is no distension.     Palpations: Abdomen is soft.     Tenderness: There is abdominal tenderness in the left upper quadrant.  Neurological:     General: No focal deficit present.     Mental Status: He is alert and oriented to person, place, and time. Mental status is at baseline.  Psychiatric:        Mood and Affect: Mood normal.        Behavior: Behavior normal.        Thought Content: Thought content normal.        Judgment: Judgment normal.     UC Treatments / Results  Labs (all labs ordered are listed, but only abnormal results are displayed) Labs Reviewed - No data to display  EKG   Radiology No results found.  Procedures Procedures (including critical care time)  Medications Ordered in UC Medications - No data to display  Initial Impression / Assessment and Plan / UC Course  I have reviewed the triage vital signs and the nursing notes.  Pertinent labs & imaging results that were available during my care of the patient were reviewed by me and considered in my medical decision making (see chart for details).     It is likely the patient's symptoms could be related to gastroesophageal reflux.   Although, patient is experiencing tachycardia, dizziness, and diarrhea so do think that more worrisome etiologies need to be ruled out.  Pain has also been refractory to reflux medications.  Advised patient that I do think he needs more extensive evaluation than can be provided at the urgent care.  Patient was advised to go to the hospital for more extensive evaluation and management.  Patient was agreeable with plan.  Vital signs fairly stable at discharge.  Agree with patient self transport to the hospital. Final Clinical Impressions(s) / UC Diagnoses   Final diagnoses:  Left upper quadrant abdominal pain     Discharge Instructions      Please go to the emergency department as soon as you leave urgent care for further evaluation and management.    ED Prescriptions   None    PDMP not reviewed this encounter.   [name], [place] 04/26/21 [phone]

## 2021-04-26 NOTE — Discharge Instructions (Signed)
Please go to the emergency department as soon as you leave urgent care for further evaluation and management. ?

## 2021-04-27 ENCOUNTER — Telehealth: Payer: Self-pay

## 2021-04-27 NOTE — Telephone Encounter (Signed)
TOC consult indicates patient not taking DM medications.  RNCM called patient x2, to advise patient to utilize [facility] at [facility], due to lower cost for insulin and metformin.  RNCM unable to leave message.

## 2021-04-30 ENCOUNTER — Encounter: Payer: Self-pay | Admitting: Family Medicine

## 2021-04-30 ENCOUNTER — Ambulatory Visit: Payer: BC Managed Care – PPO | Admitting: Family Medicine

## 2021-04-30 VITALS — BP 132/86 | HR 96 | Temp 98.0°F | Ht 70.0 in | Wt 175.5 lb

## 2021-04-30 DIAGNOSIS — R600 Localized edema: Secondary | ICD-10-CM | POA: Diagnosis not present

## 2021-04-30 DIAGNOSIS — Z794 Long term (current) use of insulin: Secondary | ICD-10-CM

## 2021-04-30 DIAGNOSIS — S91302A Unspecified open wound, left foot, initial encounter: Secondary | ICD-10-CM | POA: Diagnosis not present

## 2021-04-30 DIAGNOSIS — E1165 Type 2 diabetes mellitus with hyperglycemia: Secondary | ICD-10-CM

## 2021-04-30 NOTE — Patient Instructions (Addendum)
For the swelling in your lower extremities, be sure to elevate your legs when able, mind the salt intake, stay physically active and consider wearing compression stockings.  When you do wash it, use only soap and water. Do not vigorously scrub. Apply triple antibiotic ointment (like Neosporin) twice daily. Keep the area clean and dry.   Things to look out for: increasing pain not relieved by ibuprofen/acetaminophen, fevers, spreading redness, drainage of pus, or foul odor.  Make sure to follow up with your podiatrist.   Give [place] 2-3 business days to get the results of your labs back.   Keep the diet clean and stay active.  Consider contacting your insurance company to see what insulin they will cover.   Let us know if you need anything.

## 2021-04-30 NOTE — Progress Notes (Signed)
Chief Complaint  Patient presents with   Foot Swelling    Both     Roye Gustafson here for bilateral leg swelling.  Duration: 2 weeks Hx of prolonged bedrest, recent surgery, travel or injury? 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 No SOB? No Personal or family history of clot or bleeding disorder? No Hx of heart failure, renal failure, hepatic failure? No  Patient with a history of type 2 diabetes.  A1c was 16.1 and he was started on insulin.  He takes 16 units of Tresiba nightly.  Since that time, his sugars have been in the low 100s.  His last A1c 6 months ago was 9.9.  Diet is fair, he stays active at work.  No lows.  Cost is prohibitive with his insulin.  He does follow with a podiatrist but notes a blister popped yesterday on his foot and his left wound.  Past Medical History:  Diagnosis Date   Asthma    Diabetes mellitus without complication ([place])    History of chicken pox    History of DVT (deep vein thrombosis)    s/p trauma of R leg -- 2014. No other history of DVT   History of kidney stones    Hypertension    Lazy eye of left side    Family History  Problem Relation Age of Onset   Heart disease Mother 30   Arthritis Mother    Heart disease Father 42   Stroke Father    Healthy Brother    Heart disease Maternal Grandmother    Diabetes Maternal Uncle    Past Surgical History:  Procedure Laterality Date   EYE SURGERY      Current Outpatient Medications:    albuterol (VENTOLIN HFA) 108 (90 Base) MCG/ACT inhaler, Inhale 2 puffs into the lungs every 6 (six) hours as needed for wheezing or shortness of breath., Disp: 18 g, Rfl: 5   amLODipine (NORVASC) 5 MG tablet, Take 1 tablet (5 mg total) by mouth daily., Disp: 90 tablet, Rfl: 2   aspirin 81 MG chewable tablet, Chew 81 mg by mouth daily., Disp: , Rfl:    atorvastatin (LIPITOR) 20 MG tablet, Take 1 tablet (20 mg total) by mouth daily., Disp: 90 tablet, Rfl: 3   blood glucose meter kit and  supplies KIT, Dispense based on patient and insurance preference. Use up to four times daily as directed. (FOR ICD-9 250.00, 250.01)., Disp: 1 each, Rfl: 0   carvedilol (COREG) 25 MG tablet, Take 1 tablet (25 mg total) by mouth 2 (two) times daily with a meal., Disp: 180 tablet, Rfl: 2   chlorthalidone (HYGROTON) 25 MG tablet, Take 1 tablet (25 mg total) by mouth daily., Disp: 30 tablet, Rfl: 0   famotidine (PEPCID) 20 MG tablet, Take 1 tablet (20 mg total) by mouth 2 (two) times daily., Disp: 30 tablet, Rfl: 0   gentamicin cream (GARAMYCIN) 0.1 %, Apply 1 application topically 2 (two) times daily., Disp: 30 g, Rfl: 1   glucose blood test strip, Use as instructed, Check blood sugars twice daily, Disp: 100 each, Rfl: 12   glyBURIDE (DIABETA) 5 MG tablet, Take 2 tabs twice daily., Disp: 360 tablet, Rfl: 1   insulin degludec (TRESIBA FLEXTOUCH) 100 UNIT/ML FlexTouch Pen, Inject 16 Units into the skin at bedtime., Disp: 15 mL, Rfl: 2   Insulin Pen Needle (NOVOFINE PLUS PEN NEEDLE) 32G X 4 MM MISC, Use daily to insulin, Disp: 50 each, Rfl:  1   Lancets (ONETOUCH ULTRASOFT) lancets, Use as instructed, Check blood sugars twice daily, Disp: 100 each, Rfl: 12   levocetirizine (XYZAL) 5 MG tablet, Take 1 tablet (5 mg total) by mouth every evening., Disp: 30 tablet, Rfl: 2   levofloxacin (LEVAQUIN) 750 MG tablet, Take 1 tablet (750 mg total) by mouth daily., Disp: 14 tablet, Rfl: 0   lisinopril (ZESTRIL) 20 MG tablet, Take 1 tablet (20 mg total) by mouth daily., Disp: 90 tablet, Rfl: 2   metFORMIN (GLUCOPHAGE-XR) 500 MG 24 hr tablet, Take 2 tablets (1,000 mg total) by mouth in the morning and at bedtime., Disp: 360 tablet, Rfl: 2   Multiple Vitamins-Minerals (MENS MULTIVITAMIN PLUS) TABS, Take by mouth daily., Disp: , Rfl:    ondansetron (ZOFRAN) 4 MG tablet, Take 1 tablet (4 mg total) by mouth every 6 (six) hours., Disp: 12 tablet, Rfl: 0   pantoprazole (PROTONIX) 40 MG tablet, Take 1 tablet (40 mg total) by  mouth daily., Disp: 30 tablet, Rfl: 1   Prednisolon-Gatiflox-Bromfenac 1-0.5-0.075 % SUSP, Place 1 drop into the right eye 4 (four) times daily., Disp: , Rfl:   BP 132/86 (BP Location: Left Arm, Cuff Size: Normal)    Pulse 96    Temp 98 F (36.7 C) (Oral)    Ht 5' 10"  (1.778 m)    Wt 175 lb 8 oz (79.6 kg)    SpO2 99%    BMI 25.18 kg/m  Gen- awake, alert, appears stated age Heart- RRR, no murmurs, + nonpitting LE edema tapering distal third of tibia bilaterally Lungs- CTAB, normal effort w/o accessory muscle use Skin: Various hyperkeratinization/callus formation on his feet.  On the left lateral great toe, there is an open wound without TTP, fluctuance, drainage, spreading redness, eschar formation, or foul odor. MSK-no calf pain Psych: Age appropriate judgment and insight  Bilateral lower extremity edema - Plan: TSH  Type 2 diabetes mellitus with hyperglycemia, with long-term current use of insulin (HCC) - Plan: Lipid panel, Hemoglobin A1c, Comprehensive metabolic panel, CBC, Microalbumin / creatinine urine ratio  Wound of left foot  Likely dependent edema secondary to being on his feet.  Elevate legs, light salt intake, stay active.  Consider over-the-counter compression stockings. Chronic, uncontrolled.  Payment cards for Basaglar and Toujeo provided.  He will contact his insurance company to see which formulation they cover.  Continue glyburide 10 mg twice daily, metformin XR 1000 mg twice daily.  He cannot afford any GLP-1 or SGLT2 inhibitor. Triple antibiotic ointment twice daily, keep clean and dry.  He has an appointment with his podiatrist coming up.  Warning signs and symptoms verbalized and written down. F/u pending the above. Pt voiced understanding and agreement to the plan.  [place], DO 04/30/21  9:01 AM

## 2021-05-14 ENCOUNTER — Ambulatory Visit: Payer: BC Managed Care – PPO | Admitting: Podiatry

## 2021-05-14 ENCOUNTER — Other Ambulatory Visit: Payer: Self-pay

## 2021-05-14 ENCOUNTER — Encounter (HOSPITAL_COMMUNITY): Payer: Self-pay | Admitting: Emergency Medicine

## 2021-05-14 ENCOUNTER — Inpatient Hospital Stay (HOSPITAL_COMMUNITY)
Admission: EM | Admit: 2021-05-14 | Discharge: 2021-05-19 | DRG: 255 | Disposition: A | Discharge status: Home / Self Care | Payer: BC Managed Care – PPO | Attending: Internal Medicine | Admitting: Internal Medicine

## 2021-05-14 ENCOUNTER — Ambulatory Visit (INDEPENDENT_AMBULATORY_CARE_PROVIDER_SITE_OTHER): Payer: BC Managed Care – PPO

## 2021-05-14 ENCOUNTER — Emergency Department (HOSPITAL_COMMUNITY): Payer: BC Managed Care – PPO

## 2021-05-14 DIAGNOSIS — M869 Osteomyelitis, unspecified: Secondary | ICD-10-CM | POA: Diagnosis not present

## 2021-05-14 DIAGNOSIS — E1122 Type 2 diabetes mellitus with diabetic chronic kidney disease: Secondary | ICD-10-CM | POA: Diagnosis not present

## 2021-05-14 DIAGNOSIS — L97529 Non-pressure chronic ulcer of other part of left foot with unspecified severity: Secondary | ICD-10-CM | POA: Diagnosis not present

## 2021-05-14 DIAGNOSIS — U071 COVID-19: Secondary | ICD-10-CM

## 2021-05-14 DIAGNOSIS — N1831 Chronic kidney disease, stage 3a: Secondary | ICD-10-CM | POA: Diagnosis present

## 2021-05-14 DIAGNOSIS — E1165 Type 2 diabetes mellitus with hyperglycemia: Secondary | ICD-10-CM | POA: Diagnosis not present

## 2021-05-14 DIAGNOSIS — M86172 Other acute osteomyelitis, left ankle and foot: Secondary | ICD-10-CM | POA: Diagnosis not present

## 2021-05-14 DIAGNOSIS — R609 Edema, unspecified: Secondary | ICD-10-CM | POA: Diagnosis not present

## 2021-05-14 DIAGNOSIS — L97512 Non-pressure chronic ulcer of other part of right foot with fat layer exposed: Secondary | ICD-10-CM

## 2021-05-14 DIAGNOSIS — Z09 Encounter for follow-up examination after completed treatment for conditions other than malignant neoplasm: Secondary | ICD-10-CM

## 2021-05-14 DIAGNOSIS — Z8249 Family history of ischemic heart disease and other diseases of the circulatory system: Secondary | ICD-10-CM | POA: Diagnosis not present

## 2021-05-14 DIAGNOSIS — Z833 Family history of diabetes mellitus: Secondary | ICD-10-CM

## 2021-05-14 DIAGNOSIS — Z794 Long term (current) use of insulin: Secondary | ICD-10-CM | POA: Diagnosis not present

## 2021-05-14 DIAGNOSIS — I10 Essential (primary) hypertension: Secondary | ICD-10-CM

## 2021-05-14 DIAGNOSIS — R112 Nausea with vomiting, unspecified: Secondary | ICD-10-CM

## 2021-05-14 DIAGNOSIS — L97522 Non-pressure chronic ulcer of other part of left foot with fat layer exposed: Secondary | ICD-10-CM | POA: Diagnosis not present

## 2021-05-14 DIAGNOSIS — M86171 Other acute osteomyelitis, right ankle and foot: Secondary | ICD-10-CM | POA: Diagnosis not present

## 2021-05-14 DIAGNOSIS — N183 Chronic kidney disease, stage 3 unspecified: Secondary | ICD-10-CM | POA: Diagnosis present

## 2021-05-14 DIAGNOSIS — Z823 Family history of stroke: Secondary | ICD-10-CM

## 2021-05-14 DIAGNOSIS — Z86718 Personal history of other venous thrombosis and embolism: Secondary | ICD-10-CM | POA: Diagnosis not present

## 2021-05-14 DIAGNOSIS — E871 Hypo-osmolality and hyponatremia: Secondary | ICD-10-CM

## 2021-05-14 DIAGNOSIS — R1110 Vomiting, unspecified: Secondary | ICD-10-CM

## 2021-05-14 DIAGNOSIS — E785 Hyperlipidemia, unspecified: Secondary | ICD-10-CM | POA: Diagnosis not present

## 2021-05-14 DIAGNOSIS — E119 Type 2 diabetes mellitus without complications: Secondary | ICD-10-CM

## 2021-05-14 DIAGNOSIS — E1169 Type 2 diabetes mellitus with other specified complication: Secondary | ICD-10-CM | POA: Diagnosis present

## 2021-05-14 DIAGNOSIS — Z8261 Family history of arthritis: Secondary | ICD-10-CM | POA: Diagnosis not present

## 2021-05-14 DIAGNOSIS — D631 Anemia in chronic kidney disease: Secondary | ICD-10-CM | POA: Diagnosis not present

## 2021-05-14 DIAGNOSIS — D649 Anemia, unspecified: Secondary | ICD-10-CM

## 2021-05-14 DIAGNOSIS — E1152 Type 2 diabetes mellitus with diabetic peripheral angiopathy with gangrene: Principal | ICD-10-CM | POA: Diagnosis present

## 2021-05-14 DIAGNOSIS — D509 Iron deficiency anemia, unspecified: Secondary | ICD-10-CM | POA: Diagnosis present

## 2021-05-14 DIAGNOSIS — E0843 Diabetes mellitus due to underlying condition with diabetic autonomic (poly)neuropathy: Secondary | ICD-10-CM

## 2021-05-14 DIAGNOSIS — Z7984 Long term (current) use of oral hypoglycemic drugs: Secondary | ICD-10-CM | POA: Diagnosis not present

## 2021-05-14 DIAGNOSIS — I517 Cardiomegaly: Secondary | ICD-10-CM | POA: Diagnosis not present

## 2021-05-14 DIAGNOSIS — E11621 Type 2 diabetes mellitus with foot ulcer: Secondary | ICD-10-CM | POA: Diagnosis present

## 2021-05-14 DIAGNOSIS — S98112A Complete traumatic amputation of left great toe, initial encounter: Secondary | ICD-10-CM | POA: Diagnosis not present

## 2021-05-14 DIAGNOSIS — L97519 Non-pressure chronic ulcer of other part of right foot with unspecified severity: Secondary | ICD-10-CM | POA: Diagnosis not present

## 2021-05-14 DIAGNOSIS — Z89412 Acquired absence of left great toe: Secondary | ICD-10-CM | POA: Diagnosis not present

## 2021-05-14 DIAGNOSIS — I129 Hypertensive chronic kidney disease with stage 1 through stage 4 chronic kidney disease, or unspecified chronic kidney disease: Secondary | ICD-10-CM | POA: Diagnosis not present

## 2021-05-14 DIAGNOSIS — L03116 Cellulitis of left lower limb: Secondary | ICD-10-CM | POA: Diagnosis not present

## 2021-05-14 DIAGNOSIS — Z7982 Long term (current) use of aspirin: Secondary | ICD-10-CM | POA: Diagnosis not present

## 2021-05-14 DIAGNOSIS — R7989 Other specified abnormal findings of blood chemistry: Secondary | ICD-10-CM | POA: Diagnosis not present

## 2021-05-14 DIAGNOSIS — M86672 Other chronic osteomyelitis, left ankle and foot: Secondary | ICD-10-CM | POA: Diagnosis not present

## 2021-05-14 DIAGNOSIS — Z79899 Other long term (current) drug therapy: Secondary | ICD-10-CM

## 2021-05-14 DIAGNOSIS — I96 Gangrene, not elsewhere classified: Secondary | ICD-10-CM | POA: Diagnosis not present

## 2021-05-14 DIAGNOSIS — L03032 Cellulitis of left toe: Secondary | ICD-10-CM | POA: Diagnosis not present

## 2021-05-14 LAB — CBC WITH DIFFERENTIAL/PLATELET
Abs Immature Granulocytes: 0.03 10*3/uL (ref 0.00–0.07)
Basophils Absolute: 0 10*3/uL (ref 0.0–0.1)
Basophils Relative: 0 %
Eosinophils Absolute: 0.2 10*3/uL (ref 0.0–0.5)
Eosinophils Relative: 2 %
HCT: 36.6 % — ABNORMAL LOW (ref 39.0–52.0)
Hemoglobin: 12.4 g/dL — ABNORMAL LOW (ref 13.0–17.0)
Immature Granulocytes: 1 %
Lymphocytes Relative: 20 %
Lymphs Abs: 1.3 10*3/uL (ref 0.7–4.0)
MCH: 30.6 pg (ref 26.0–34.0)
MCHC: 33.9 g/dL (ref 30.0–36.0)
MCV: 90.4 fL (ref 80.0–100.0)
Monocytes Absolute: 0.8 10*3/uL (ref 0.1–1.0)
Monocytes Relative: 13 %
Neutro Abs: 4.1 10*3/uL (ref 1.7–7.7)
Neutrophils Relative %: 64 %
Platelets: 251 10*3/uL (ref 150–400)
RBC: 4.05 MIL/uL — ABNORMAL LOW (ref 4.22–5.81)
RDW: 11.9 % (ref 11.5–15.5)
WBC: 6.3 10*3/uL (ref 4.0–10.5)
nRBC: 0 % (ref 0.0–0.2)

## 2021-05-14 LAB — URINALYSIS, ROUTINE W REFLEX MICROSCOPIC
Bacteria, UA: NONE SEEN
Bilirubin Urine: NEGATIVE
Glucose, UA: >=500 mg/dL — AB
Ketones, ur: 5 mg/dL — AB
Leukocytes,Ua: NEGATIVE
Nitrite: NEGATIVE
Protein, ur: 100 mg/dL — AB
Specific Gravity, Urine: 1.02 (ref 1.005–1.030)
pH: 5 (ref 5.0–8.0)

## 2021-05-14 LAB — COMPREHENSIVE METABOLIC PANEL
ALT: 18 U/L (ref 0–44)
AST: 17 U/L (ref 15–41)
Albumin: 3.5 g/dL (ref 3.5–5.0)
Alkaline Phosphatase: 66 U/L (ref 38–126)
Anion gap: 9 (ref 5–15)
BUN: 14 mg/dL (ref 6–20)
CO2: 24 mmol/L (ref 22–32)
Calcium: 9.1 mg/dL (ref 8.9–10.3)
Chloride: 101 mmol/L (ref 98–111)
Creatinine, Ser: 1.28 mg/dL — ABNORMAL HIGH (ref 0.61–1.24)
GFR, Estimated: >60 mL/min (ref >60)
Glucose, Bld: 252 mg/dL — ABNORMAL HIGH (ref 70–99)
Potassium: 4 mmol/L (ref 3.5–5.1)
Sodium: 134 mmol/L — ABNORMAL LOW (ref 135–145)
Total Bilirubin: 0.7 mg/dL (ref 0.3–1.2)
Total Protein: 8.6 g/dL — ABNORMAL HIGH (ref 6.5–8.1)

## 2021-05-14 LAB — CBG MONITORING, ED: Glucose-Capillary: 220 mg/dL — ABNORMAL HIGH (ref 70–99)

## 2021-05-14 LAB — LACTIC ACID, PLASMA: Lactic Acid, Venous: 1.1 mmol/L (ref 0.5–1.9)

## 2021-05-14 MED ORDER — AMLODIPINE BESYLATE 5 MG PO TABS
5.0000 mg | ORAL_TABLET | Freq: Every day | ORAL | Status: DC
  Administered 2021-05-14 – 2021-05-19 (×6): 5 mg via ORAL
  Filled 2021-05-14 (×6): qty 1

## 2021-05-14 MED ORDER — VANCOMYCIN HCL 1500 MG/300ML IV SOLN
1500.0000 mg | Freq: Once | INTRAVENOUS | Status: DC
Start: 2021-05-14 — End: 2021-05-14
  Filled 2021-05-14: qty 300

## 2021-05-14 MED ORDER — CARVEDILOL 25 MG PO TABS
25.0000 mg | ORAL_TABLET | Freq: Two times a day (BID) | ORAL | Status: DC
  Administered 2021-05-14 – 2021-05-19 (×10): 25 mg via ORAL
  Filled 2021-05-14 (×3): qty 1
  Filled 2021-05-14: qty 2
  Filled 2021-05-14 (×6): qty 1

## 2021-05-14 MED ORDER — PIPERACILLIN-TAZOBACTAM 3.375 G IVPB
3.3750 g | Freq: Three times a day (TID) | INTRAVENOUS | Status: DC
  Administered 2021-05-15 – 2021-05-16 (×4): 3.375 g via INTRAVENOUS
  Filled 2021-05-14 (×5): qty 50

## 2021-05-14 MED ORDER — ATORVASTATIN CALCIUM 10 MG PO TABS
20.0000 mg | ORAL_TABLET | Freq: Every day | ORAL | Status: DC
  Administered 2021-05-15 – 2021-05-19 (×5): 20 mg via ORAL
  Filled 2021-05-14 (×5): qty 2

## 2021-05-14 MED ORDER — VANCOMYCIN HCL 1750 MG/350ML IV SOLN
1750.0000 mg | INTRAVENOUS | Status: DC
  Administered 2021-05-14 – 2021-05-18 (×5): 1750 mg via INTRAVENOUS
  Filled 2021-05-14 (×6): qty 350

## 2021-05-14 MED ORDER — INSULIN ASPART 100 UNIT/ML IJ SOLN
0.0000 [IU] | INTRAMUSCULAR | Status: DC
  Administered 2021-05-14: 3 [IU] via SUBCUTANEOUS
  Administered 2021-05-15: 2 [IU] via SUBCUTANEOUS

## 2021-05-14 MED ORDER — LORAZEPAM 1 MG PO TABS: 0.5000 mg | ORAL_TABLET | Freq: Once | ORAL | Status: DC

## 2021-05-14 MED ORDER — ACETAMINOPHEN 650 MG RE SUPP: 650.0000 mg | Freq: Four times a day (QID) | RECTAL | Status: DC | PRN

## 2021-05-14 MED ORDER — CARVEDILOL 12.5 MG PO TABS: 25.0000 mg | ORAL_TABLET | Freq: Two times a day (BID) | ORAL | Status: DC

## 2021-05-14 MED ORDER — PIPERACILLIN-TAZOBACTAM 3.375 G IVPB 30 MIN
3.3750 g | Freq: Once | INTRAVENOUS | Status: AC
  Administered 2021-05-14: 3.375 g via INTRAVENOUS
  Filled 2021-05-14: qty 50

## 2021-05-14 MED ORDER — GADOBUTROL 1 MMOL/ML IV SOLN
7.5000 mL | Freq: Once | INTRAVENOUS | Status: AC | PRN
  Administered 2021-05-14: 7.5 mL via INTRAVENOUS

## 2021-05-14 MED ORDER — AMLODIPINE BESYLATE 5 MG PO TABS: 5.0000 mg | ORAL_TABLET | Freq: Every day | ORAL | Status: DC

## 2021-05-14 MED ORDER — ACETAMINOPHEN 325 MG PO TABS
650.0000 mg | ORAL_TABLET | Freq: Four times a day (QID) | ORAL | Status: DC | PRN
  Administered 2021-05-16 – 2021-05-18 (×2): 650 mg via ORAL
  Filled 2021-05-14 (×2): qty 2

## 2021-05-14 NOTE — ED Provider Notes (Signed)
[facility]PT [facility] Emergency Department Provider Note MRN:  [id number]  Arrival date & time: 05/14/21     Chief Complaint   Wound Check   History of Present Illness   John [name] is a 56 y.o. year-old male presents to the ED with chief complaint of wound on left great toe.  He states that he was sent to the ED by his podiatrist, Dr Logan Bores, who told him that he would likely need emergency surgery.  Patient states that he is diabetic, and that his blood sugars have been running high.  He denies any fever or chills.  He states that the wound has been present on his toe for quite some time, but it waxes and wanes in severity.  He reports that he has been on Levaquin.  History provided by patient.  Review of Systems  Pertinent review of systems noted in HPI.    Physical Exam   Vitals:   05/14/21 2100 05/14/21 2200  BP: (!) 191/117 (!) 169/100  Pulse: 98 95  Resp: 16 16  Temp:    SpO2: 99% 99%    CONSTITUTIONAL:  well-appearing, NAD NEURO:  Alert and oriented x 3, CN 3-12 grossly intact EYES:  eyes equal and reactive ENT/NECK:  Supple, no stridor  CARDIO:  normal rate, regular rhythm, appears well-perfused  PULM:  No respiratory distress,  GI/GU:  non-distended,  MSK/SPINE:  No gross deformities, moderate edema to left foot, moves all extremities  SKIN: Foul-smelling wound to left great toe      *Additional and/or pertinent findings included in MDM below  Diagnostic and Interventional Summary    EKG Interpretation  Date/Time:    Ventricular Rate:    PR Interval:    QRS Duration:   QT Interval:    QTC Calculation:   R Axis:     Text Interpretation:         Labs Reviewed  COMPREHENSIVE METABOLIC PANEL - Abnormal; Notable for the following components:      Result Value   Sodium 134 (*)    Glucose, Bld 252 (*)    Creatinine, Ser 1.28 (*)    Total Protein 8.6 (*)    All other components within normal limits  CBC WITH DIFFERENTIAL/PLATELET -  Abnormal; Notable for the following components:   RBC 4.05 (*)    Hemoglobin 12.4 (*)    HCT 36.6 (*)    All other components within normal limits  URINALYSIS, ROUTINE W REFLEX MICROSCOPIC - Abnormal; Notable for the following components:   APPearance HAZY (*)    Glucose, UA >=500 (*)    Hgb urine dipstick SMALL (*)    Ketones, ur 5 (*)    Protein, ur 100 (*)    All other components within normal limits  CULTURE, BLOOD (ROUTINE X 2)  CULTURE, BLOOD (ROUTINE X 2)  LACTIC ACID, PLASMA  LACTIC ACID, PLASMA    MR FOOT LEFT W WO CONTRAST  Final Result      Medications  LORazepam (ATIVAN) tablet 0.5 mg (has no administration in time range)  piperacillin-tazobactam (ZOSYN) IVPB 3.375 g (3.375 g Intravenous New Bag/Given 05/14/21 2244)  vancomycin (VANCOREADY) IVPB 1750 mg/350 mL (has no administration in time range)  gadobutrol (GADAVIST) 1 MMOL/ML injection 7.5 mL (7.5 mLs Intravenous Contrast Given 05/14/21 1802)     Procedures  /  Critical Care .Critical Care Performed by: Roxy Horseman, PA-C Authorized by: Roxy Horseman, PA-C   Critical care provider statement:    Critical care time (  minutes):  30   Critical care was necessary to treat or prevent imminent or life-threatening deterioration of the following conditions: osteomyelitis, multiple IV antibiotics and surgery needed.   Critical care was time spent personally by me on the following activities:  Development of treatment plan with patient or surrogate, discussions with consultants, evaluation of patient's response to treatment, examination of patient, ordering and review of laboratory studies, ordering and review of radiographic studies, ordering and performing treatments and interventions, pulse oximetry, re-evaluation of patient's condition and review of old charts  ED Course and Medical Decision Making  I have reviewed the triage vital signs, the nursing notes, and pertinent available records from the  EMR.  Complexity of Problems Addressed: High Complexity: Acute illness/injury posing a threat to life or bodily function, requiring emergent diagnostic workup, evaluation, and treatment as below.  ED Course:    After considering the following differential, diabetic wound infection, osteomyelitis, I ordered IV zosyn and vanc.  I personally interpreted the labs which are notable for normal lactate, no significant leukocytosis. I visualized the MRI which is notable for signs of osteo and agree with the radiologist interpretation..  Social Determinants Affecting Care: No clinically significant social determinants affecting this chief complaint.    Consultants: I discussed the case with Hospitalist, Dr. Toniann Fail, who is appreciated for admitting.   Treatment and Plan: Will treat with IV vanc and zosyn and admit to medicine for probable surgery tomorrow.  Patient's exam and diagnostic results are concerning for osteomyelitis.  Feel that patient will need admission to the hospital for further treatment and evaluation.  Patient discussed with attending physician, Dr. Dalene Seltzer, who agrees with plan.  Final Clinical Impressions(s) / ED Diagnoses     ICD-10-CM   1. Osteomyelitis of left foot, unspecified type [facility])  M86.9       ED Discharge Orders     None        Discharge Instructions Discussed with and Provided to Patient:   Discharge Instructions   None      Roxy Horseman, PA-C 05/14/21 2255    Alvira Monday, MD 05/15/21 1209

## 2021-05-14 NOTE — Progress Notes (Addendum)
° °  Subjective:  56 y.o. male with PMHx of diabetes mellitus presenting today for evaluation of worsening infection to the left great toe.  Patient states that he has noticed increased pain and tenderness also to the back of his left knee.  He is concerned for swelling throughout the left foot and leg.  Patient has not been seen in the office since 03/12/2021.  He presents for further treatment and evaluation  Past Medical History:  Diagnosis Date   Asthma    Diabetes mellitus without complication (HCC)    History of chicken pox    History of DVT (deep vein thrombosis)    s/p trauma of R leg -- 2014. No other history of DVT   History of kidney stones    Hypertension    Lazy eye of left side    Past Surgical History:  Procedure Laterality Date   EYE SURGERY     No Known Allergies      Objective/Physical Exam General: The patient is alert and oriented x3 in no acute distress.  Dermatology:  Wound #1 noted to the left great toe with heavy amount of serous drainage and maceration around the toe.  Diffuse hyperkeratotic skin and callus tissue also noted.  No significant malodor.  Vascular: Palpable pedal pulses.  The skin to the left leg and foot is warm compared to the contralateral limb  Neurological: Light touch and protective threshold absent bilaterally.   Musculoskeletal Exam: No pedal deformity noted.  No prior amputations.  Radiographic exam LT foot: Osseous erosion of the distal phalanx of the left great toe is noted compared to prior x-rays.  Findings consistent with osteomyelitis.  Soft tissue swelling also noted on x-ray.  Small radiolucencies consistent with gas also noted to the soft tissue of the toe There may also be some slight irregularity and cortical periosteal reaction to the medial head of the proximal phalanx at the level of the PIPJ adjacent to the great toe.  Assessment: 1.  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 diabetes mellitus;  uncontrolled w/ peripheral neuropathy 3.  Osteomyelitis left great toe   Plan of Care:  1. Patient was evaluated.  X-rays evaluated today 2.  Advised the patient to go the emergency department today for osteomyelitis of the left great toe and worsening infection to the left lower extremity.  Patient agrees and will go to the emergency department for admission to [facility].  Recommend IV antibiotics, MRI LT foot, podiatry consultation 3.  Patient understands that he will require surgical amputation pending MRI results and findings 4.  Betadine wet-to-dry dressings applied 5.  Postsurgical shoe dispensed.  Weightbearing as tolerated 7.  Cultures were also taken and sent to pathology for culture and sensitivity  8.  Podiatry will follow while inpatient once consulted after admission  Felecia Shelling, DPM [facility]  Dr. Felecia Shelling, DPM    [address] [place], [place] [address]                Office [phone]  Fax [phone]

## 2021-05-14 NOTE — ED Provider Triage Note (Signed)
Emergency Medicine Provider Triage Evaluation Note  John [name] , a 56 y.o. male  was evaluated in triage.  Pt complains of evaluation of wound on left great toe, first appeared about 5 months ago.  Patient was seen by podiatry today and diagnosed with osteomyelitis of that toe, they recommended he present to the emergency department for IV antibiotics and an MRI.  Review of Systems  Positive: Foot pain Negative: Fevers, chills  Physical Exam  BP (!) 180/111 (BP Location: Right Arm)    Pulse (!) 117    Temp 98.2 F (36.8 C) (Oral)    Resp 17    SpO2 99%  Gen:   Awake, no distress   Resp:  Normal effort  MSK:   Moves extremities without difficulty  Other:  Bandaged foot, photo in podiatry note  Medical Decision Making  Medically screening exam initiated at 2:37 PM.  Appropriate orders placed.  Sally Reimers was informed that the remainder of the evaluation will be completed by another provider, this initial triage assessment does not replace that evaluation, and the importance of remaining in the ED until their evaluation is complete.     Brandonn Capelli T, PA-C 05/14/21 1438

## 2021-05-14 NOTE — Progress Notes (Addendum)
Pharmacy Antibiotic Note  John [name] is a 56 y.o. male admitted on 05/14/2021 with cellulitis.  Pharmacy has been consulted for vancomycin dosing.  Patient with a history of asthma, DM, DVT, kidney stones, HTN. Patient presenting with wound on left great toe. Pt reports being sent to ED by podiatry office for evaluation of potential osteomyelitis.  SCr 1.28 - BL~1.2 WBC 6.3; LA 1.1; afeb  Plan: Zosyn 3.375g q8h Vancomycin 1750 mg q24hr (eAUC 505) unless change in renal function Trend WBC, Fever, Renal function, & Clinical course F/u cultures, clinical course, WBC, fever De-escalate when able     Temp (24hrs), Avg:98.1 F (36.7 C), Min:98 F (36.7 C), Max:98.2 F (36.8 C)  Recent Labs  Lab 05/14/21 1434 05/14/21 1450  WBC  --  6.3  CREATININE  --  1.28*  LATICACIDVEN 1.1  --     CrCl cannot be calculated (Unknown ideal weight.).    No Known Allergies  Antimicrobials this admission: zosyn 2/20 >>  vancomycin 2/20 >>  Microbiology results: Pending  Thank you for allowing pharmacy to be a part of this patients care.  Delmar Landau, PharmD, BCPS 05/14/2021 10:38 PM ED Clinical Pharmacist -  [phone]

## 2021-05-14 NOTE — ED Triage Notes (Signed)
Patient sent to [facility] for evaluation of wound on left great toe that first appeared approximately four months ago. Patient afebrile in triage.

## 2021-05-14 NOTE — H&P (Signed)
History and Physical    Edvardo Honse ONG:[id number] DOB: 25-Mar-1966 DOA: 05/14/2021  PCP: [name], DO  Patient coming from: Home.  Chief Complaint: Increasing discharge from the left great toe.  HPI: John [name] is a 56 y.o. male with a history of diabetes mellitus type 2, hypertension was referred to the ER after patient was having persistent drainage from the left great toe.  Patient states his symptoms started about a month ago.  Was placed on 2 courses of antibiotics.  The second of which was patient was on doxycycline.  Denies any fever chills.  Denies any trauma.  ED Course: In the ER patient was afebrile.  MRI of the left foot showed features consistent with great toe osteomyelitis.  Patient was started on empiric antibiotic admitted for further work-up.  Patient's COVID test came back incidentally positive.  Patient denies any shortness of breath or cough.  Labs show creatinine of 1.2 blood glucose of 252 hemoglobin 12.4.  Review of Systems: As per HPI, rest all negative.   Past Medical History:  Diagnosis Date   Asthma    Diabetes mellitus without complication ([place])    History of chicken pox    History of DVT (deep vein thrombosis)    s/p trauma of R leg -- 2014. No other history of DVT   History of kidney stones    Hypertension    Lazy eye of left side     Past Surgical History:  Procedure Laterality Date   EYE SURGERY       reports that he has never smoked. He has never used smokeless tobacco. He reports that he does not drink alcohol and does not use drugs.  No Known Allergies  Family History  Problem Relation Age of Onset   Heart disease Mother 34   Arthritis Mother    Heart disease Father 53   Stroke Father    Healthy Brother    Heart disease Maternal Grandmother    Diabetes Maternal Uncle     Prior to Admission medications   Medication Sig Start Date End Date Taking? Authorizing Provider  albuterol (VENTOLIN HFA) 108 (90 Base) MCG/ACT  inhaler Inhale 2 puffs into the lungs every 6 (six) hours as needed for wheezing or shortness of breath. 10/21/19  Yes Wendling, [name], DO  amLODipine (NORVASC) 5 MG tablet Take 1 tablet (5 mg total) by mouth daily. 07/18/20  Yes [name], DO  aspirin 81 MG chewable tablet Chew 81 mg by mouth daily.   Yes [provider]  atorvastatin (LIPITOR) 20 MG tablet Take 1 tablet (20 mg total) by mouth daily. 03/14/21  Yes [name], DO  carvedilol (COREG) 25 MG tablet Take 1 tablet (25 mg total) by mouth 2 (two) times daily with a meal. 07/18/20  Yes Wendling, [name], DO  insulin degludec (TRESIBA FLEXTOUCH) 100 UNIT/ML FlexTouch Pen Inject 16 Units into the skin at bedtime. 10/27/20  Yes [name], DO  Insulin Pen Needle (NOVOFINE PLUS PEN NEEDLE) 32G X 4 MM MISC Use daily to insulin 10/27/20  Yes Wendling, [name], DO  levocetirizine (XYZAL) 5 MG tablet Take 1 tablet (5 mg total) by mouth every evening. Patient taking differently: Take 5 mg by mouth daily as needed for allergies. 12/01/19  Yes [name], DO  lisinopril (ZESTRIL) 20 MG tablet Take 1 tablet (20 mg total) by mouth daily. 07/18/20  Yes [name], DO  ondansetron (ZOFRAN) 4 MG tablet Take  1 tablet (4 mg total) by mouth every 6 (six) hours. Patient taking differently: Take 4 mg by mouth every 8 (eight) hours as needed for nausea or vomiting. 2/0/10  Yes Campbell Stall P, DO  blood glucose meter kit and supplies KIT Dispense based on patient and insurance preference. Use up to four times daily as directed. (FOR ICD-9 250.00, 250.01). 10/18/15   [name], PA-C  chlorthalidone (HYGROTON) 25 MG tablet Take 1 tablet (25 mg total) by mouth daily. Patient not taking: Reported on 05/14/2021 01/27/21   [name], FNP  famotidine (PEPCID) 20 MG tablet Take 1 tablet (20 mg total) by mouth 2 (two) times daily. Patient not taking: Reported on 05/14/2021 0/7/12    Campbell Stall P, DO  gentamicin cream (GARAMYCIN) 0.1 % Apply 1 application topically 2 (two) times daily. Patient not taking: Reported on 05/14/2021 03/12/21   [name], DPM  glucose blood test strip Use as instructed, Check blood sugars twice daily 05/09/17   [name], PA-C  glyBURIDE (DIABETA) 5 MG tablet Take 2 tabs twice daily. Patient not taking: Reported on 05/14/2021 08/25/20   [name], DO  Lancets [facility] ULTRASOFT) lancets Use as instructed, Check blood sugars twice daily 05/09/17   [name], PA-C  levofloxacin (LEVAQUIN) 750 MG tablet Take 1 tablet (750 mg total) by mouth daily. Patient not taking: Reported on 05/14/2021 03/12/21   [name], DPM  metFORMIN (GLUCOPHAGE-XR) 500 MG 24 hr tablet Take 2 tablets (1,000 mg total) by mouth in the morning and at bedtime. Patient not taking: Reported on 05/14/2021 07/18/20   [name], DO  pantoprazole (PROTONIX) 40 MG tablet Take 1 tablet (40 mg total) by mouth daily. Patient not taking: Reported on 05/14/2021 08/25/20   [name], DO    Physical Exam: Constitutional: Moderately built and nourished. Vitals:   05/14/21 1728 05/14/21 1955 05/14/21 2100 05/14/21 2200  BP: (!) 158/99 (!) 178/128 (!) 191/117 (!) 169/100  Pulse: (!) 111 (!) 118 98 95  Resp: 18 16 16 16   Temp: 98 F (36.7 C)     TempSrc:      SpO2: 98% 100% 99% 99%   Eyes: Anicteric no pallor. ENMT: No discharge from the ears eyes nose and mouth. Neck: No mass felt.  No neck rigidity. Respiratory: No rhonchi or crepitations. Cardiovascular: S1-S2 heard. Abdomen: Soft nontender bowel sound present. Musculoskeletal: Left foot great toe looks inflamed also left foot appears edematous.  Has good pulses. Skin: Left foot great toe that looks inflamed. Neurologic: Alert awake oriented time place and person.  Moves all extremities. Psychiatric: Appears normal.  Normal affect.   Labs on Admission: I have  personally reviewed following labs and imaging studies  CBC: Recent Labs  Lab 05/14/21 1450  WBC 6.3  NEUTROABS 4.1  HGB 12.4*  HCT 36.6*  MCV 90.4  PLT 197   Basic Metabolic Panel: Recent Labs  Lab 05/14/21 1450  NA 134*  K 4.0  CL 101  CO2 24  GLUCOSE 252*  BUN 14  CREATININE 1.28*  CALCIUM 9.1   GFR: CrCl cannot be calculated (Unknown ideal weight.). Liver Function Tests: Recent Labs  Lab 05/14/21 1450  AST 17  ALT 18  ALKPHOS 66  BILITOT 0.7  PROT 8.6*  ALBUMIN 3.5   No results for input(s): LIPASE, AMYLASE in the last 168 hours. No results for input(s): AMMONIA in the last 168 hours. Coagulation Profile: No results for input(s):  INR, PROTIME in the last 168 hours. Cardiac Enzymes: No results for input(s): CKTOTAL, CKMB, CKMBINDEX, TROPONINI in the last 168 hours. BNP (last 3 results) No results for input(s): PROBNP in the last 8760 hours. HbA1C: No results for input(s): HGBA1C in the last 72 hours. CBG: No results for input(s): GLUCAP in the last 168 hours. Lipid Profile: No results for input(s): CHOL, HDL, LDLCALC, TRIG, CHOLHDL, LDLDIRECT in the last 72 hours. Thyroid Function Tests: No results for input(s): TSH, T4TOTAL, FREET4, T3FREE, THYROIDAB in the last 72 hours. Anemia Panel: No results for input(s): VITAMINB12, FOLATE, FERRITIN, TIBC, IRON, RETICCTPCT in the last 72 hours. Urine analysis:    Component Value Date/Time   COLORURINE YELLOW 05/14/2021 1333   APPEARANCEUR HAZY (A) 05/14/2021 1333   LABSPEC 1.020 05/14/2021 1333   PHURINE 5.0 05/14/2021 1333   GLUCOSEU >=500 (A) 05/14/2021 1333   GLUCOSEU >=1000 (A) 10/18/2015 0928   HGBUR SMALL (A) 05/14/2021 1333   BILIRUBINUR NEGATIVE 05/14/2021 1333   BILIRUBINUR neg 10/31/2015 1716   KETONESUR 5 (A) 05/14/2021 1333   PROTEINUR 100 (A) 05/14/2021 1333   UROBILINOGEN 0.2 10/31/2015 1716   UROBILINOGEN 0.2 10/18/2015 0928   NITRITE NEGATIVE 05/14/2021 1333   LEUKOCYTESUR NEGATIVE  05/14/2021 1333   Sepsis Labs: @LABRCNTIP (procalcitonin:4,lacticidven:4) )No results found for this or any previous visit (from the past 240 hour(s)).   Radiological Exams on Admission: MR FOOT LEFT W WO CONTRAST  Result Date: 05/14/2021 CLINICAL DATA:  Non diabetic patient with foot swelling. Osteomyelitis suspected. Left great toe wound first appeared about 5 months ago. Podiatrist today diagnosed osteomyelitis of great toe. EXAM: MRI OF THE LEFT FOREFOOT WITHOUT AND WITH CONTRAST TECHNIQUE: Multiplanar, multisequence MR imaging of the left foot was performed both before and after administration of intravenous contrast. CONTRAST:  7.41m GADAVIST GADOBUTROL 1 MMOL/ML IV SOLN COMPARISON:  Left foot radiographs 02/26/2021 FINDINGS: Bones/Joint/Cartilage There is mild-to-moderate erosion of the distal aspect of the great toe distal phalanx. Diffuse marrow edema throughout the great toe distal phalanx. More mild diffuse edema throughout the great toe proximal phalanx. There is moderate joint space narrowing and medial cuneiform greater than base of the first metatarsal marrow edema likely degenerative. Likely degenerative marrow edema is also seen within the lateral navicular and intermediate cuneiform with cartilage thinning at the navicular-cuneiform and second tarsometatarsal joints. Ligaments: The Lisfranc ligament complex is intact. The plantar plates appear intact. Muscles and Tendons Visualized flexor and extensor tendons are unremarkable. Soft tissues There is diffuse high-grade swelling of the great toe. There is a sinus tract within the distal lateral aspect of the great toe contacting bone. No rim enhancing abscess is seen. Moderate to high-grade dorsal foot subcutaneous fat swelling. IMPRESSION: Soft tissue swelling of the great toe with a sinus tract within the distal lateral aspect of the great toe contacting bone. Distal aspect of the distal phalanx cortical erosion with diffuse marrow edema  throughout the distal phalanx indicating acute osteomyelitis. There is also mild diffuse edema throughout the proximal phalanx of the great toe which may reflect reactive change versus early osteomyelitis. Electronically Signed   By: RYvonne KendallM.D.   On: 05/14/2021 19:00   DG Foot Complete Right  Result Date: 05/14/2021 Please see detailed radiograph report in office note.     Assessment/Plan Principal Problem:   Osteomyelitis of great toe of left foot (HCC) Active Problems:   Type 2 diabetes mellitus with hyperglycemia, with long-term current use of insulin (HCC)   Hypertension   Hyperlipidemia  LDL goal <100   CKD (chronic kidney disease) stage 3, GFR 30-59 ml/min (HCC)    Left foot great toe osteomyelitis for which patient is on empiric antibiotics.  We will keep patient n.p.o. consult patient's podiatrist Dr. Amalia Hailey. COVID-19 infection positive patient is presently not hypoxic or symptomatic.  We will keep patient on remdesivir for which patient is considered.  We will check inflammatory markers chest x-ray. Patient on amlodipine and Coreg. Diabetes mellitus type 2 on long-acting insulin 60 units at bedtime.  Sliding scale coverage. Chronic kidney disease stage III creatinine appears to be at baseline. Anemia follow CBC check anemia panel with next blood draw.  Since patient has osteomyelitis and also COVID infection will need close monitoring for any further worsening inpatient status.   DVT prophylaxis: SCDs.  Avoiding anticoagulation in anticipation of possible surgery. Code Status: Full code. Family Communication: Discussed with patient. Disposition Plan: Home. Consults called: We will need to consult patient's podiatrist. Admission status: Inpatient.   Rise Patience MD [facility] Pager [phone].  If 7PM-7AM, please contact night-coverage [email or web address] Password [email or web address]  05/14/2021, 11:10 PM

## 2021-05-14 NOTE — Addendum Note (Signed)
Addended by: Francesca Oman on: 05/14/2021 01:35 PM   Modules accepted: Orders

## 2021-05-15 ENCOUNTER — Inpatient Hospital Stay (HOSPITAL_COMMUNITY): Payer: BC Managed Care – PPO

## 2021-05-15 ENCOUNTER — Telehealth: Payer: Self-pay | Admitting: Podiatry

## 2021-05-15 DIAGNOSIS — I129 Hypertensive chronic kidney disease with stage 1 through stage 4 chronic kidney disease, or unspecified chronic kidney disease: Secondary | ICD-10-CM | POA: Diagnosis present

## 2021-05-15 DIAGNOSIS — E785 Hyperlipidemia, unspecified: Secondary | ICD-10-CM | POA: Diagnosis present

## 2021-05-15 DIAGNOSIS — Z794 Long term (current) use of insulin: Secondary | ICD-10-CM | POA: Diagnosis not present

## 2021-05-15 DIAGNOSIS — E1165 Type 2 diabetes mellitus with hyperglycemia: Secondary | ICD-10-CM | POA: Diagnosis present

## 2021-05-15 DIAGNOSIS — Z8249 Family history of ischemic heart disease and other diseases of the circulatory system: Secondary | ICD-10-CM | POA: Diagnosis not present

## 2021-05-15 DIAGNOSIS — U071 COVID-19: Secondary | ICD-10-CM

## 2021-05-15 DIAGNOSIS — R609 Edema, unspecified: Secondary | ICD-10-CM | POA: Diagnosis not present

## 2021-05-15 DIAGNOSIS — E871 Hypo-osmolality and hyponatremia: Secondary | ICD-10-CM | POA: Diagnosis present

## 2021-05-15 DIAGNOSIS — N1831 Chronic kidney disease, stage 3a: Secondary | ICD-10-CM | POA: Diagnosis present

## 2021-05-15 DIAGNOSIS — Z7982 Long term (current) use of aspirin: Secondary | ICD-10-CM | POA: Diagnosis not present

## 2021-05-15 DIAGNOSIS — E11621 Type 2 diabetes mellitus with foot ulcer: Secondary | ICD-10-CM | POA: Diagnosis present

## 2021-05-15 DIAGNOSIS — E1169 Type 2 diabetes mellitus with other specified complication: Secondary | ICD-10-CM | POA: Diagnosis present

## 2021-05-15 DIAGNOSIS — Z79899 Other long term (current) drug therapy: Secondary | ICD-10-CM | POA: Diagnosis not present

## 2021-05-15 DIAGNOSIS — Z86718 Personal history of other venous thrombosis and embolism: Secondary | ICD-10-CM | POA: Diagnosis not present

## 2021-05-15 DIAGNOSIS — D649 Anemia, unspecified: Secondary | ICD-10-CM

## 2021-05-15 DIAGNOSIS — E1152 Type 2 diabetes mellitus with diabetic peripheral angiopathy with gangrene: Secondary | ICD-10-CM | POA: Diagnosis present

## 2021-05-15 DIAGNOSIS — Z8261 Family history of arthritis: Secondary | ICD-10-CM | POA: Diagnosis not present

## 2021-05-15 DIAGNOSIS — E1122 Type 2 diabetes mellitus with diabetic chronic kidney disease: Secondary | ICD-10-CM | POA: Diagnosis present

## 2021-05-15 DIAGNOSIS — Z833 Family history of diabetes mellitus: Secondary | ICD-10-CM | POA: Diagnosis not present

## 2021-05-15 DIAGNOSIS — I96 Gangrene, not elsewhere classified: Secondary | ICD-10-CM | POA: Diagnosis present

## 2021-05-15 DIAGNOSIS — D509 Iron deficiency anemia, unspecified: Secondary | ICD-10-CM | POA: Diagnosis present

## 2021-05-15 DIAGNOSIS — Z7984 Long term (current) use of oral hypoglycemic drugs: Secondary | ICD-10-CM | POA: Diagnosis not present

## 2021-05-15 DIAGNOSIS — Z823 Family history of stroke: Secondary | ICD-10-CM | POA: Diagnosis not present

## 2021-05-15 DIAGNOSIS — D631 Anemia in chronic kidney disease: Secondary | ICD-10-CM | POA: Diagnosis present

## 2021-05-15 DIAGNOSIS — M86672 Other chronic osteomyelitis, left ankle and foot: Secondary | ICD-10-CM

## 2021-05-15 DIAGNOSIS — M869 Osteomyelitis, unspecified: Secondary | ICD-10-CM | POA: Diagnosis present

## 2021-05-15 DIAGNOSIS — L97519 Non-pressure chronic ulcer of other part of right foot with unspecified severity: Secondary | ICD-10-CM | POA: Diagnosis present

## 2021-05-15 LAB — CBC
HCT: 33.9 % — ABNORMAL LOW (ref 39.0–52.0)
Hemoglobin: 11.1 g/dL — ABNORMAL LOW (ref 13.0–17.0)
MCH: 29.7 pg (ref 26.0–34.0)
MCHC: 32.7 g/dL (ref 30.0–36.0)
MCV: 90.6 fL (ref 80.0–100.0)
Platelets: 233 10*3/uL (ref 150–400)
RBC: 3.74 MIL/uL — ABNORMAL LOW (ref 4.22–5.81)
RDW: 12.1 % (ref 11.5–15.5)
WBC: 4.9 10*3/uL (ref 4.0–10.5)
nRBC: 0 % (ref 0.0–0.2)

## 2021-05-15 LAB — BASIC METABOLIC PANEL
Anion gap: 7 (ref 5–15)
BUN: 11 mg/dL (ref 6–20)
CO2: 26 mmol/L (ref 22–32)
Calcium: 8.9 mg/dL (ref 8.9–10.3)
Chloride: 104 mmol/L (ref 98–111)
Creatinine, Ser: 1.25 mg/dL — ABNORMAL HIGH (ref 0.61–1.24)
GFR, Estimated: >60 mL/min (ref >60)
Glucose, Bld: 124 mg/dL — ABNORMAL HIGH (ref 70–99)
Potassium: 3.5 mmol/L (ref 3.5–5.1)
Sodium: 137 mmol/L (ref 135–145)

## 2021-05-15 LAB — HIV ANTIBODY (ROUTINE TESTING W REFLEX): HIV Screen 4th Generation wRfx: NONREACTIVE

## 2021-05-15 LAB — HEMOGLOBIN A1C
Hgb A1c MFr Bld: 14 % — ABNORMAL HIGH (ref 4.8–5.6)
Mean Plasma Glucose: 355 mg/dL

## 2021-05-15 LAB — GLUCOSE, CAPILLARY
Glucose-Capillary: 168 mg/dL — ABNORMAL HIGH (ref 70–99)
Glucose-Capillary: 121 mg/dL — ABNORMAL HIGH (ref 70–99)
Glucose-Capillary: 281 mg/dL — ABNORMAL HIGH (ref 70–99)
Glucose-Capillary: 265 mg/dL — ABNORMAL HIGH (ref 70–99)

## 2021-05-15 LAB — RESP PANEL BY RT-PCR (FLU A&B, COVID) ARPGX2
Influenza A by PCR: NEGATIVE
Influenza B by PCR: NEGATIVE
SARS Coronavirus 2 by RT PCR: POSITIVE — AB

## 2021-05-15 LAB — LACTIC ACID, PLASMA: Lactic Acid, Venous: 0.7 mmol/L (ref 0.5–1.9)

## 2021-05-15 MED ORDER — INSULIN ASPART 100 UNIT/ML IJ SOLN
0.0000 [IU] | Freq: Three times a day (TID) | INTRAMUSCULAR | Status: DC
  Administered 2021-05-15: 5 [IU] via SUBCUTANEOUS
  Administered 2021-05-16: 2 [IU] via SUBCUTANEOUS
  Administered 2021-05-16 (×2): 3 [IU] via SUBCUTANEOUS
  Administered 2021-05-17: 2 [IU] via SUBCUTANEOUS
  Administered 2021-05-17 (×2): 1 [IU] via SUBCUTANEOUS
  Administered 2021-05-18: 2 [IU] via SUBCUTANEOUS
  Administered 2021-05-19: 1 [IU] via SUBCUTANEOUS
  Administered 2021-05-19: 3 [IU] via SUBCUTANEOUS

## 2021-05-15 MED ORDER — INSULIN ASPART 100 UNIT/ML IJ SOLN
0.0000 [IU] | INTRAMUSCULAR | Status: DC
  Administered 2021-05-15: 1 [IU] via SUBCUTANEOUS

## 2021-05-15 MED ORDER — SODIUM CHLORIDE 0.9 % IV SOLN
100.0000 mg | Freq: Every day | INTRAVENOUS | Status: AC
  Administered 2021-05-16: 100 mg via INTRAVENOUS
  Filled 2021-05-15 (×2): qty 20

## 2021-05-15 MED ORDER — INSULIN GLARGINE-YFGN 100 UNIT/ML ~~LOC~~ SOLN
16.0000 [IU] | Freq: Every day | SUBCUTANEOUS | Status: DC
  Administered 2021-05-15 – 2021-05-18 (×4): 16 [IU] via SUBCUTANEOUS
  Filled 2021-05-15 (×5): qty 0.16

## 2021-05-15 MED ORDER — SODIUM CHLORIDE 0.9 % IV SOLN
200.0000 mg | Freq: Once | INTRAVENOUS | Status: AC
  Administered 2021-05-15: 200 mg via INTRAVENOUS
  Filled 2021-05-15: qty 40

## 2021-05-15 NOTE — Progress Notes (Signed)
°  Progress Note   Patient: John [name] [id number] DOB: [birth year] DOA: 05/14/2021     0 DOS: the patient was seen and examined on 05/15/2021   Brief hospital course: 56 year old past medical history significant for diabetes type 2, hypertension, presents to the ED with increase and persistent drainage from the left great toe.  Symptoms started a month ago.  Patient completed 2 course of antibiotics. MRI of the left foot showed fissure consistent with great toe osteomyelitis.  Patient was incidentally found to have COVID-positive. Dr. Amalia Hailey has been consulted.  Assessment and Plan: * Osteomyelitis of great toe of left foot ([place])- (present on admission) Patient presented with worsening drainage from left great toe.   -MRI: Soft tissue swelling of the great toe with sinus tract within the distal lateral aspect of the great toe contacting bone.  Distal aspect of the distal phalanx cortical erosion with diffuse marrow edema throughout the distal phalanx indicating acute osteomyelitis.  Mild diffuse edema throughout the proximal -Continue with IV Vancomycin  and Zosyn. -Dr. Amalia Hailey, podiatry consulted.  Normocytic anemia Check anemia panel.  Hyponatremia Mild, resolved with IV fluids.  COVID-19 virus infection Patient reports mild cough.  Incidentally found to have COVID-positive by PCR. Started on rhythm severe, plan to complete 3 doses. Monitor for hypoxemia.  CKD (chronic kidney disease) stage 3, GFR 30-59 ml/min (HCC)- (present on admission) CKD stage IIIa.  Creatinine baseline 1.1 Continue to monitor  Hyperlipidemia LDL goal <100- (present on admission) Continue with Lipitor  Hypertension- (present on admission) Continue with Coreg, Norvasc  Type 2 diabetes mellitus with hyperglycemia, with long-term current use of insulin (HCC) Continues with Semglee 16 units daily. Continue with a sliding scale insulin         Subjective: He report mild cough since yesterday. Denies  dyspnea.   Physical Exam: Vitals:   05/15/21 0130 05/15/21 0150 05/15/21 0421 05/15/21 0700  BP: (!) 144/79  (!) 145/81 (!) 153/96  Pulse: 86 86 78 84  Resp:  20 17 16   Temp:  98.2 F (36.8 C)  98.7 F (37.1 C)  TempSrc:  Oral  Oral  SpO2: 99% 98% 96% 99%  Weight:  78.4 kg    Height:  5\' 10"  (1.778 m)     General; NAD Lung; CTA CVS; S 1, S 2 RRR Extremity; left great toe with swelling, redness.   Data Reviewed:  CBC, Bmet  MRI reviewed.   Family Communication: Care discussed with patient  Disposition: Status is: Inpatient Remains inpatient appropriate because: Admitted with great toe infection requiring IV antibiotics and possible surgery          Planned Discharge Destination: Home     Time spent: 45 minutes  Author: Elmarie Shiley, MD 05/15/2021 2:19 PM  For on call review [email or web address].

## 2021-05-15 NOTE — Progress Notes (Signed)
Mobility Specialist: Progress Note   05/15/21 1103  Mobility  Activity Ambulated independently in room  Level of Assistance Independent  Assistive Device None  Distance Ambulated (ft) 240 ft  Activity Response Tolerated well  $Mobility charge 1 Mobility   Received pt in bed having no complaints and agreeable to mobility. Asymptomatic throughout ambulation, returned back to bed w/ call bell in reach and all needs met.  [facility] [place] Mobility Specialist Mobility Specialist 5 [address]: [phone] Mobility Specialist 6 [address]: [phone]

## 2021-05-15 NOTE — Assessment & Plan Note (Addendum)
Continues with Semglee 16 units daily. Continue with a sliding scale insulin Hyperglycemia, uncontrolled. A1c 14.  He will need better outpatient glucose control

## 2021-05-15 NOTE — ED Notes (Signed)
ED TO INPATIENT HANDOFF REPORT  ED Nurse Name and Phone #: Ginny Forth [phone]  S Name/Age/Gender John [name] 56 y.o. male Room/Bed: 023C/023C  Code Status   Code Status: Full Code  Home/SNF/Other Home Patient oriented to: self, place, time, and situation Is this baseline? Yes   Triage Complete: Triage complete  Chief Complaint Osteomyelitis of great toe of left foot [facility]) [M86.9]  Triage Note Patient sent to [facility] for evaluation of wound on left great toe that first appeared approximately four months ago. Patient afebrile in triage.   Allergies No Known Allergies  Level of Care/Admitting Diagnosis ED Disposition     ED Disposition  Admit   Condition  --   Comment  Hospital Area: [place] [[id number]]  Level of Care: Telemetry Medical [104]  May place patient in observation at [facility] or [place] if equivalent level of care is available:: No  Covid Evaluation: Asymptomatic Screening Protocol (No Symptoms)  Diagnosis: [facility]omyel[facility] great [facility] left [id number]  Admitting Physician: Rise Patience [phone]  Attending Physician: Rise Patience Lei.Right          B Medical/Surgery History Past Medical History:  Diagnosis Date   Asthma    Diabetes mellitus without complication ([place])    History of chicken pox    History of DVT (deep vein thrombosis)    s/p trauma of R leg -- 2014. No other history of DVT   History of kidney stones    Hypertension    Lazy eye of left side    Past Surgical History:  Procedure Laterality Date   EYE SURGERY       A IV Location/Drains/Wounds Patient Lines/Drains/Airways Status     Active Line/Drains/Airways     Name Placement date Placement time Site Days   Peripheral IV 05/14/21 20 G Right Antecubital 05/14/21  2242  Antecubital  1            Intake/Output Last 24 hours  Intake/Output Summary (Last 24 hours) at 05/15/2021 0105 Last data filed at 05/14/2021 2359 Gross per  24 hour  Intake 42.85 ml  Output --  Net 42.85 ml    Labs/Imaging Results for orders placed or performed during the hospital encounter of 05/14/21 (from the past 48 hour(s))  Urinalysis, Routine w reflex microscopic     Status: Abnormal   Collection Time: 05/14/21  1:33 PM  Result Value Ref Range   Color, Urine YELLOW YELLOW   APPearance HAZY (A) CLEAR   Specific Gravity, Urine 1.020 1.005 - 1.030   pH 5.0 5.0 - 8.0   Glucose, UA >=500 (A) NEGATIVE mg/dL   Hgb urine dipstick SMALL (A) NEGATIVE   Bilirubin Urine NEGATIVE NEGATIVE   Ketones, ur 5 (A) NEGATIVE mg/dL   Protein, ur 100 (A) NEGATIVE mg/dL   Nitrite NEGATIVE NEGATIVE   Leukocytes,Ua NEGATIVE NEGATIVE   RBC / HPF 0-5 0 - 5 RBC/hpf   WBC, UA 0-5 0 - 5 WBC/hpf   Bacteria, UA NONE SEEN NONE SEEN   Squamous Epithelial / LPF 0-5 0 - 5   Mucus PRESENT     Comment: Performed at [facility], [address], [place], [place] [address]  Lactic acid, plasma     Status: None   Collection Time: 05/14/21  2:34 PM  Result Value Ref Range   Lactic Acid, Venous 1.1 0.5 - 1.9 mmol/L    Comment: Performed at [facility] [address],  [place], [place] [address]  Comprehensive metabolic panel     Status: Abnormal   Collection Time: 05/14/21  2:50 PM  Result Value Ref Range   Sodium 134 (L) 135 - 145 mmol/L   Potassium 4.0 3.5 - 5.1 mmol/L   Chloride 101 98 - 111 mmol/L   CO2 24 22 - 32 mmol/L   Glucose, Bld 252 (H) 70 - 99 mg/dL    Comment: Glucose reference range applies only to samples taken after fasting for at least 8 hours.   BUN 14 6 - 20 mg/dL   Creatinine, Ser 1.28 (H) 0.61 - 1.24 mg/dL   Calcium 9.1 8.9 - 10.3 mg/dL   Total Protein 8.6 (H) 6.5 - 8.1 g/dL   Albumin 3.5 3.5 - 5.0 g/dL   AST 17 15 - 41 U/L   ALT 18 0 - 44 U/L   Alkaline Phosphatase 66 38 - 126 U/L   Total Bilirubin 0.7 0.3 - 1.2 mg/dL   GFR, Estimated >60 >60 mL/min    Comment: (NOTE) Calculated using the CKD-EPI Creatinine Equation  (2021)    Anion gap 9 5 - 15    Comment: Performed at [facility], [place] [address] [place], [place] [address]  CBC with Differential     Status: Abnormal   Collection Time: 05/14/21  2:50 PM  Result Value Ref Range   WBC 6.3 4.0 - 10.5 K/uL   RBC 4.05 (L) 4.22 - 5.81 MIL/uL   Hemoglobin 12.4 (L) 13.0 - 17.0 g/dL   HCT 36.6 (L) 39.0 - 52.0 %   MCV 90.4 80.0 - 100.0 fL   MCH 30.6 26.0 - 34.0 pg   MCHC 33.9 30.0 - 36.0 g/dL   RDW 11.9 11.5 - 15.5 %   Platelets 251 150 - 400 K/uL   nRBC 0.0 0.0 - 0.2 %   Neutrophils Relative % 64 %   Neutro Abs 4.1 1.7 - 7.7 K/uL   Lymphocytes Relative 20 %   Lymphs Abs 1.3 0.7 - 4.0 K/uL   Monocytes Relative 13 %   Monocytes Absolute 0.8 0.1 - 1.0 K/uL   Eosinophils Relative 2 %   Eosinophils Absolute 0.2 0.0 - 0.5 K/uL   Basophils Relative 0 %   Basophils Absolute 0.0 0.0 - 0.1 K/uL   Immature Granulocytes 1 %   Abs Immature Granulocytes 0.03 0.00 - 0.07 K/uL    Comment: Performed at [facility] [address], [place], [place] [address]  CBG monitoring, ED     Status: Abnormal   Collection Time: 05/14/21 11:51 PM  Result Value Ref Range   Glucose-Capillary 220 (H) 70 - 99 mg/dL    Comment: Glucose reference range applies only to samples taken after fasting for at least 8 hours.   MR FOOT LEFT W WO CONTRAST  Result Date: 05/14/2021 CLINICAL DATA:  Non diabetic patient with foot swelling. Osteomyelitis suspected. Left great toe wound first appeared about 5 months ago. Podiatrist today diagnosed osteomyelitis of great toe. EXAM: MRI OF THE LEFT FOREFOOT WITHOUT AND WITH CONTRAST TECHNIQUE: Multiplanar, multisequence MR imaging of the left foot was performed both before and after administration of intravenous contrast. CONTRAST:  7.63mL GADAVIST GADOBUTROL 1 MMOL/ML IV SOLN COMPARISON:  Left foot radiographs 02/26/2021 FINDINGS: Bones/Joint/Cartilage There is mild-to-moderate erosion of the distal aspect of the great toe distal phalanx.  Diffuse marrow edema throughout the great toe distal phalanx. More mild diffuse edema throughout the great toe proximal phalanx. There is moderate  joint space narrowing and medial cuneiform greater than base of the first metatarsal marrow edema likely degenerative. Likely degenerative marrow edema is also seen within the lateral navicular and intermediate cuneiform with cartilage thinning at the navicular-cuneiform and second tarsometatarsal joints. Ligaments: The Lisfranc ligament complex is intact. The plantar plates appear intact. Muscles and Tendons Visualized flexor and extensor tendons are unremarkable. Soft tissues There is diffuse high-grade swelling of the great toe. There is a sinus tract within the distal lateral aspect of the great toe contacting bone. No rim enhancing abscess is seen. Moderate to high-grade dorsal foot subcutaneous fat swelling. IMPRESSION: Soft tissue swelling of the great toe with a sinus tract within the distal lateral aspect of the great toe contacting bone. Distal aspect of the distal phalanx cortical erosion with diffuse marrow edema throughout the distal phalanx indicating acute osteomyelitis. There is also mild diffuse edema throughout the proximal phalanx of the great toe which may reflect reactive change versus early osteomyelitis. Electronically Signed   By: Yvonne Kendall M.D.   On: 05/14/2021 19:00   DG Foot Complete Right  Result Date: 05/14/2021 Please see detailed radiograph report in office note.   Pending Labs Unresulted Labs (From admission, onward)     Start     Ordered   05/15/21 [id number]  Basic metabolic panel  Tomorrow morning,   R        05/14/21 2309   05/15/21 0500  CBC  Tomorrow morning,   R        05/14/21 2309   05/15/21 0047  Resp Panel by RT-PCR (Flu A&B, Covid) Nasopharyngeal Swab  (Tier 2 - Symptomatic/asymptomatic)  Once,   R        05/15/21 0046   05/14/21 2307  HIV Antibody (routine testing w rflx)  (HIV Antibody (Routine testing w reflex)  panel)  Once,   R        05/14/21 2309   05/14/21 1434  Lactic acid, plasma  Now then every 2 hours,   STAT      05/14/21 1434   05/14/21 1434  Blood culture (routine x 2)  BLOOD CULTURE X 2,   STAT      05/14/21 1434            Vitals/Pain Today's Vitals   05/14/21 1728 05/14/21 1955 05/14/21 2100 05/14/21 2200  BP: (!) 158/99 (!) 178/128 (!) 191/117 (!) 169/100  Pulse: (!) 111 (!) 118 98 95  Resp: 18 16 16 16   Temp: 98 F (36.7 C)     TempSrc:      SpO2: 98% 100% 99% 99%  PainSc:        Isolation Precautions No active isolations  Medications Medications  vancomycin (VANCOREADY) IVPB 1750 mg/350 mL (1,750 mg Intravenous New Bag/Given 05/14/21 2315)  atorvastatin (LIPITOR) tablet 20 mg (has no administration in time range)  insulin aspart (novoLOG) injection 0-9 Units (3 Units Subcutaneous Given 05/14/21 2357)  acetaminophen (TYLENOL) tablet 650 mg (has no administration in time range)    Or  acetaminophen (TYLENOL) suppository 650 mg (has no administration in time range)  piperacillin-tazobactam (ZOSYN) IVPB 3.375 g (has no administration in time range)  amLODipine (NORVASC) tablet 5 mg (5 mg Oral Given 05/14/21 2357)  carvedilol (COREG) tablet 25 mg (25 mg Oral Given 05/14/21 2357)  gadobutrol (GADAVIST) 1 MMOL/ML injection 7.5 mL (7.5 mLs Intravenous Contrast Given 05/14/21 1802)  piperacillin-tazobactam (ZOSYN) IVPB 3.375 g (0 g Intravenous Stopped 05/14/21 2359)    Mobility walks  Low fall risk   Focused Assessments Cardiac Assessment Handoff:    No results found for: CKTOTAL, CKMB, CKMBINDEX, TROPONINI No results found for: DDIMER Does the Patient currently have chest pain? No    R Recommendations: See Admitting Provider Note  Report given to:   Additional Notes: n/a

## 2021-05-15 NOTE — Assessment & Plan Note (Signed)
Continue with core 

## 2021-05-15 NOTE — Assessment & Plan Note (Addendum)
Continue with Coreg, Norvasc.

## 2021-05-15 NOTE — Consult Note (Signed)
Reason for Consult:Infection Referring Physician: Dr. Niel Hummer, MD  John [name] is an 56 y.o. male.  HPI: 56 year old male with past medical history significant diabetes, hypertension referred to the emergency room from Dr. Amalia Hailey for concerns of drainage, wound to his left great toe.  X-rays were concerning for osteomyelitis and MRI was performed.  Podiatry consulted for further evaluation.  Past Medical History:  Diagnosis Date   Asthma    Diabetes mellitus without complication ([place])    History of chicken pox    History of DVT (deep vein thrombosis)    s/p trauma of R leg -- 2014. No other history of DVT   History of kidney stones    Hypertension    Lazy eye of left side     Past Surgical History:  Procedure Laterality Date   EYE SURGERY      Family History  Problem Relation Age of Onset   Heart disease Mother 15   Arthritis Mother    Heart disease Father 80   Stroke Father    Healthy Brother    Heart disease Maternal Grandmother    Diabetes Maternal Uncle     Social History:  reports that he has never smoked. He has never used smokeless tobacco. He reports that he does not drink alcohol and does not use drugs.  Allergies: No Known Allergies  Medications: I have reviewed the patient's current medications.  Results for orders placed or performed during the hospital encounter of 05/14/21 (from the past 48 hour(s))  Urinalysis, Routine w reflex microscopic     Status: Abnormal   Collection Time: 05/14/21  1:33 PM  Result Value Ref Range   Color, Urine YELLOW YELLOW   APPearance HAZY (A) CLEAR   Specific Gravity, Urine 1.020 1.005 - 1.030   pH 5.0 5.0 - 8.0   Glucose, UA >=500 (A) NEGATIVE mg/dL   Hgb urine dipstick SMALL (A) NEGATIVE   Bilirubin Urine NEGATIVE NEGATIVE   Ketones, ur 5 (A) NEGATIVE mg/dL   Protein, ur 100 (A) NEGATIVE mg/dL   Nitrite NEGATIVE NEGATIVE   Leukocytes,Ua NEGATIVE NEGATIVE   RBC / HPF 0-5 0 - 5 RBC/hpf   WBC, UA 0-5 0 - 5  WBC/hpf   Bacteria, UA NONE SEEN NONE SEEN   Squamous Epithelial / LPF 0-5 0 - 5   Mucus PRESENT     Comment: Performed at [facility], [address], [place], [place] [address]  Blood culture (routine x 2)     Status: None (Preliminary result)   Collection Time: 05/14/21  1:33 PM   Specimen: BLOOD  Result Value Ref Range   Specimen Description BLOOD BLOOD RIGHT ARM    Special Requests      BOTTLES DRAWN AEROBIC AND ANAEROBIC Blood Culture results may not be optimal due to an inadequate volume of blood received in culture bottles   Culture      NO GROWTH < 24 HOURS Performed at [facility] [address] [place], [place] [address]    Report Status PENDING   Blood culture (routine x 2)     Status: None (Preliminary result)   Collection Time: 05/14/21  1:33 PM   Specimen: BLOOD  Result Value Ref Range   Specimen Description BLOOD BLOOD RIGHT HAND    Special Requests      BOTTLES DRAWN AEROBIC AND ANAEROBIC Blood Culture adequate volume   Culture      NO GROWTH < 24 HOURS Performed at [facility], [place] [address], [place], [place] [address]    Report Status PENDING   Lactic acid, plasma     Status: None   Collection Time: 05/14/21  2:34 PM  Result Value Ref Range   Lactic Acid, Venous 1.1 0.5 - 1.9 mmol/L    Comment: Performed at [facility], [place] [address] [place], [place] [address]  Comprehensive metabolic panel     Status: Abnormal   Collection Time: 05/14/21  2:50 PM  Result Value Ref Range   Sodium 134 (L) 135 - 145 mmol/L   Potassium 4.0 3.5 - 5.1 mmol/L   Chloride 101 98 - 111 mmol/L   CO2 24 22 - 32 mmol/L   Glucose, Bld 252 (H) 70 - 99 mg/dL    Comment: Glucose reference range applies only to samples taken after fasting for at least 8 hours.   BUN 14 6 - 20 mg/dL   Creatinine, Ser 1.28 (H) 0.61 - 1.24 mg/dL   Calcium 9.1 8.9 - 10.3 mg/dL   Total Protein 8.6 (H) 6.5 - 8.1 g/dL   Albumin 3.5 3.5 - 5.0 g/dL   AST 17 15 - 41 U/L   ALT  18 0 - 44 U/L   Alkaline Phosphatase 66 38 - 126 U/L   Total Bilirubin 0.7 0.3 - 1.2 mg/dL   GFR, Estimated >60 >60 mL/min    Comment: (NOTE) Calculated using the CKD-EPI Creatinine Equation (2021)    Anion gap 9 5 - 15    Comment: Performed at [facility], [place] [address], [place], [place] [address]  CBC with Differential     Status: Abnormal   Collection Time: 05/14/21  2:50 PM  Result Value Ref Range   WBC 6.3 4.0 - 10.5 K/uL   RBC 4.05 (L) 4.22 - 5.81 MIL/uL   Hemoglobin 12.4 (L) 13.0 - 17.0 g/dL   HCT 36.6 (L) 39.0 - 52.0 %   MCV 90.4 80.0 - 100.0 fL   MCH 30.6 26.0 - 34.0 pg   MCHC 33.9 30.0 - 36.0 g/dL   RDW 11.9 11.5 - 15.5 %   Platelets 251 150 - 400 K/uL   nRBC 0.0 0.0 - 0.2 %   Neutrophils Relative % 64 %   Neutro Abs 4.1 1.7 - 7.7 K/uL   Lymphocytes Relative 20 %   Lymphs Abs 1.3 0.7 - 4.0 K/uL   Monocytes Relative 13 %   Monocytes Absolute 0.8 0.1 - 1.0 K/uL   Eosinophils Relative 2 %   Eosinophils Absolute 0.2 0.0 - 0.5 K/uL   Basophils Relative 0 %   Basophils Absolute 0.0 0.0 - 0.1 K/uL   Immature Granulocytes 1 %   Abs Immature Granulocytes 0.03 0.00 - 0.07 K/uL    Comment: Performed at [facility] [address], [place], [place] [address]  CBG monitoring, ED     Status: Abnormal   Collection Time: 05/14/21 11:51 PM  Result Value Ref Range   Glucose-Capillary 220 (H) 70 - 99 mg/dL    Comment: Glucose reference range applies only to samples taken after fasting for at least 8 hours.  Resp Panel by RT-PCR (Flu A&B, Covid) Nasopharyngeal Swab     Status: Abnormal   Collection Time: 05/15/21 12:48 AM   Specimen: Nasopharyngeal Swab; Nasopharyngeal(NP) swabs in vial transport medium  Result Value Ref Range   SARS Coronavirus 2 by RT PCR POSITIVE (A) NEGATIVE    Comment: (NOTE) SARS-CoV-2 target nucleic acids  are DETECTED.  The SARS-CoV-2 RNA is generally detectable in upper respiratory specimens during the acute phase of infection. Positive  results are indicative of the presence of the identified virus, but do not rule out bacterial infection or co-infection with other pathogens not detected by the test. Clinical correlation with patient history and other diagnostic information is necessary to determine patient infection status. The expected result is Negative.  Fact Sheet for Patients: EntrepreneurPulse.com.au  Fact Sheet for Healthcare Providers: IncredibleEmployment.be  This test is not yet approved or cleared by the [place] FDA and  has been authorized for detection and/or diagnosis of SARS-CoV-2 by FDA under an Emergency Use Authorization (EUA).  This EUA will remain in effect (meaning this test can be used) for the duration of  the COVID-19 declaration under Section 564(b)(1) of the A ct, 21 U.S.C. section 360bbb-3(b)(1), unless the authorization is terminated or revoked sooner.     Influenza A by PCR NEGATIVE NEGATIVE   Influenza B by PCR NEGATIVE NEGATIVE    Comment: (NOTE) The Xpert Xpress SARS-CoV-2/FLU/RSV plus assay is intended as an aid in the diagnosis of influenza from Nasopharyngeal swab specimens and should not be used as a sole basis for treatment. Nasal washings and aspirates are unacceptable for Xpert Xpress SARS-CoV-2/FLU/RSV testing.  Fact Sheet for Patients: EntrepreneurPulse.com.au  Fact Sheet for Healthcare Providers: IncredibleEmployment.be  This test is not yet approved or cleared by the [place] FDA and has been authorized for detection and/or diagnosis of SARS-CoV-2 by FDA under an Emergency Use Authorization (EUA). This EUA will remain in effect (meaning this test can be used) for the duration of the COVID-19 declaration under Section 564(b)(1) of the Act, 21 U.S.C. section 360bbb-3(b)(1), unless the authorization is terminated or revoked.  Performed at [facility], [place] [address]  [place], [place] [address]   Lactic acid, plasma     Status: None   Collection Time: 05/15/21  2:10 AM  Result Value Ref Range   Lactic Acid, Venous 0.7 0.5 - 1.9 mmol/L    Comment: Performed at [facility] [address], [place], [place] [address]  HIV Antibody (routine testing w rflx)     Status: None   Collection Time: 05/15/21  2:10 AM  Result Value Ref Range   HIV Screen 4th Generation wRfx Non Reactive Non Reactive    Comment: Performed at [facility], [place] [address], [place], [place] [id number]  Basic metabolic panel     Status: Abnormal   Collection Time: 05/15/21  2:10 AM  Result Value Ref Range   Sodium 137 135 - 145 mmol/L   Potassium 3.5 3.5 - 5.1 mmol/L   Chloride 104 98 - 111 mmol/L   CO2 26 22 - 32 mmol/L   Glucose, Bld 124 (H) 70 - 99 mg/dL    Comment: Glucose reference range applies only to samples taken after fasting for at least 8 hours.   BUN 11 6 - 20 mg/dL   Creatinine, Ser 1.25 (H) 0.61 - 1.24 mg/dL   Calcium 8.9 8.9 - 10.3 mg/dL   GFR, Estimated >60 >60 mL/min    Comment: (NOTE) Calculated using the CKD-EPI Creatinine Equation (2021)    Anion gap 7 5 - 15    Comment: Performed at [facility] [address], [place], [place] [address]  CBC     Status: Abnormal   Collection Time: 05/15/21  2:10 AM  Result Value Ref Range   WBC  4.9 4.0 - 10.5 K/uL   RBC 3.74 (L) 4.22 - 5.81 MIL/uL   Hemoglobin 11.1 (L) 13.0 - 17.0 g/dL   HCT 33.9 (L) 39.0 - 52.0 %   MCV 90.6 80.0 - 100.0 fL   MCH 29.7 26.0 - 34.0 pg   MCHC 32.7 30.0 - 36.0 g/dL   RDW 12.1 11.5 - 15.5 %   Platelets 233 150 - 400 K/uL   nRBC 0.0 0.0 - 0.2 %    Comment: Performed at [facility], [place] [address], [place], [place] [address]  Glucose, capillary     Status: Abnormal   Collection Time: 05/15/21  7:38 AM  Result Value Ref Range   Glucose-Capillary 168 (H) 70 - 99 mg/dL    Comment: Glucose reference range applies only to samples taken after fasting for at least 8  hours.  Glucose, capillary     Status: Abnormal   Collection Time: 05/15/21 11:53 AM  Result Value Ref Range   Glucose-Capillary 121 (H) 70 - 99 mg/dL    Comment: Glucose reference range applies only to samples taken after fasting for at least 8 hours.    MR FOOT LEFT W WO CONTRAST  Result Date: 05/14/2021 CLINICAL DATA:  Non diabetic patient with foot swelling. Osteomyelitis suspected. Left great toe wound first appeared about 5 months ago. Podiatrist today diagnosed osteomyelitis of great toe. EXAM: MRI OF THE LEFT FOREFOOT WITHOUT AND WITH CONTRAST TECHNIQUE: Multiplanar, multisequence MR imaging of the left foot was performed both before and after administration of intravenous contrast. CONTRAST:  7.24mL GADAVIST GADOBUTROL 1 MMOL/ML IV SOLN COMPARISON:  Left foot radiographs 02/26/2021 FINDINGS: Bones/Joint/Cartilage There is mild-to-moderate erosion of the distal aspect of the great toe distal phalanx. Diffuse marrow edema throughout the great toe distal phalanx. More mild diffuse edema throughout the great toe proximal phalanx. There is moderate joint space narrowing and medial cuneiform greater than base of the first metatarsal marrow edema likely degenerative. Likely degenerative marrow edema is also seen within the lateral navicular and intermediate cuneiform with cartilage thinning at the navicular-cuneiform and second tarsometatarsal joints. Ligaments: The Lisfranc ligament complex is intact. The plantar plates appear intact. Muscles and Tendons Visualized flexor and extensor tendons are unremarkable. Soft tissues There is diffuse high-grade swelling of the great toe. There is a sinus tract within the distal lateral aspect of the great toe contacting bone. No rim enhancing abscess is seen. Moderate to high-grade dorsal foot subcutaneous fat swelling. IMPRESSION: Soft tissue swelling of the great toe with a sinus tract within the distal lateral aspect of the great toe contacting bone. Distal  aspect of the distal phalanx cortical erosion with diffuse marrow edema throughout the distal phalanx indicating acute osteomyelitis. There is also mild diffuse edema throughout the proximal phalanx of the great toe which may reflect reactive change versus early osteomyelitis. Electronically Signed   By: Yvonne Kendall M.D.   On: 05/14/2021 19:00   DG CHEST PORT 1 VIEW  Result Date: 05/15/2021 CLINICAL DATA:  Infection in the left great toe.  COVID-19. EXAM: PORTABLE CHEST 1 VIEW COMPARISON:  PA Lat 04/25/2017 FINDINGS: There is mild cardiomegaly. Mediastinal contours are within normal limits with a slightly elevated right hemidiaphragm. No vascular congestion is seen. Both lungs are clear. The visualized skeletal structures are unremarkable. IMPRESSION: No active disease.  Mild cardiomegaly. Electronically Signed   By: Telford Nab M.D.   On: 05/15/2021 06:28   DG Foot Complete Right  Result Date: 05/14/2021 Please see  detailed radiograph report in office note.   Review of Systems Blood pressure (!) 153/96, pulse 84, temperature 98.7 F (37.1 C), temperature source Oral, resp. rate 16, height 5\' 10"  (1.778 m), weight 78.4 kg, SpO2 99 %. Physical Exam General: AAO x3, NAD  Dermatological: Full-thickness ulceration of the left hallux there is edema present to the toe.  There is no frank purulence.  Mild erythema.  No ascending cellulitis.  No fluctuation or crepitation.  On the medial aspect of the second toe the PIPJ is a superficial wound present without any drainage or pus or any probing to bone, undermining or tunneling.  No fluctuation crepitation.  Superficial wound also noted to the right hallux without any drainage or pus.  Vascular: Dorsalis Pedis artery and Posterior Tibial artery pedal pulses are palpable bilateral with immedate capillary fill time.  There is no pain with calf compression, swelling, warmth, erythema.   Neruologic: Sensation decreased  Musculoskeletal: No pain on  palpation.  Assessment/Plan: Osteomyelitis left hallux, right hallux ulceration  Reviewed the x-rays as well as the MRI with the patient which did show osteomyelitis.  Discussed amputation versus toe salvage.  My recommendation is hallux amputation.  After discussion the patient agrees and wishes to proceed.  We discussed the surgery as well as postoperative course.  We discussed risks of surgery including spread of infection, delayed/nonhealing, bleeding, further amputation as well as general risks of surgery.  We will plan for left hallux amputation tomorrow.  N.p.o. after midnight.  We will need to monitor left second toe but at this point no signs of infection  X-ray ordered for right hallux given ulceration.  Trula Slade 05/15/2021, 1:09 PM

## 2021-05-15 NOTE — Assessment & Plan Note (Addendum)
Patient Iron deficiency  anemia. B 12 879, iron level 42, ferritin 260 Start  oral iron when infection is controlled. He will also need a screening colonoscopy as an outpatient.

## 2021-05-15 NOTE — Assessment & Plan Note (Addendum)
CKD stage IIIa.  Creatinine baseline 1.1 Continue to monitor. Stable.

## 2021-05-15 NOTE — Assessment & Plan Note (Addendum)
Patient presented with worsening drainage from left great toe.   -MRI: Soft tissue swelling of the great toe with sinus tract within the distal lateral aspect of the great toe contacting bone.  Distal aspect of the distal phalanx cortical erosion with diffuse marrow edema throughout the distal phalanx indicating acute osteomyelitis.  Mild diffuse edema throughout the proximal -Continue with IV Vancomycin  And cefepime and flagyl for 48 hours post sx. -Underwent left great toe amputation 2/22. -He will need 1 week of oral antibiotics at discharge. Discharge home today, on Augmentin.  Wound culture grew enterococcus sensitive to Ampicillin.

## 2021-05-15 NOTE — Assessment & Plan Note (Addendum)
Patient reports mild cough.  Incidentally found to have COVID-positive by PCR. Completed 3 days Remdesivir 2/23. Monitor for hypoxemia. COVID-19 Labs  Recent Labs    05/16/21 0443 05/17/21 0302 05/18/21 0810  DDIMER 2.66* 2.64* 2.36*  FERRITIN 260  --   --   CRP 2.9* 2.2* 3.3*    Lab Results  Component Value Date   SARSCOV2NAA POSITIVE (A) 05/15/2021

## 2021-05-15 NOTE — Assessment & Plan Note (Addendum)
Pseudohyponatremia. Mild, resolved with IV fluids and correction of hyperglycemia.

## 2021-05-15 NOTE — Telephone Encounter (Signed)
Received call from [place] nurse with a consult for pt. Mri done and it showed osteomyelitis. Pt is also covid positive. Pt is @ [facility] room 32.

## 2021-05-15 NOTE — Hospital Course (Addendum)
56 year old past medical history significant for diabetes type 2, hypertension, presents to the ED with increase and persistent drainage from the left great toe.  Symptoms started a month ago.  Patient completed 2 course of antibiotics. MRI of the left foot showed fissure consistent with great toe osteomyelitis.  Patient was incidentally found to have COVID-positive. Dr. Logan Bores has been consulted.  Patient underwent left great toe amputation by Dr. Ardelle Anton on 05/16/2021.  Tolerating diet. Vomiting resolved.

## 2021-05-16 ENCOUNTER — Inpatient Hospital Stay (HOSPITAL_COMMUNITY): Payer: BC Managed Care – PPO | Admitting: Anesthesiology

## 2021-05-16 ENCOUNTER — Encounter (HOSPITAL_COMMUNITY): Payer: Self-pay | Admitting: Internal Medicine

## 2021-05-16 ENCOUNTER — Encounter (HOSPITAL_COMMUNITY)
Admission: EM | Disposition: A | Discharge status: Home / Self Care | Payer: Self-pay | Source: Home / Self Care | Attending: Internal Medicine

## 2021-05-16 ENCOUNTER — Other Ambulatory Visit: Payer: Self-pay

## 2021-05-16 ENCOUNTER — Inpatient Hospital Stay (HOSPITAL_COMMUNITY): Payer: BC Managed Care – PPO

## 2021-05-16 DIAGNOSIS — M869 Osteomyelitis, unspecified: Secondary | ICD-10-CM | POA: Diagnosis not present

## 2021-05-16 DIAGNOSIS — R609 Edema, unspecified: Secondary | ICD-10-CM | POA: Diagnosis not present

## 2021-05-16 DIAGNOSIS — M86672 Other chronic osteomyelitis, left ankle and foot: Secondary | ICD-10-CM | POA: Diagnosis not present

## 2021-05-16 DIAGNOSIS — R7989 Other specified abnormal findings of blood chemistry: Secondary | ICD-10-CM

## 2021-05-16 HISTORY — PX: AMPUTATION TOE: SHX6595

## 2021-05-16 LAB — CBC WITH DIFFERENTIAL/PLATELET
Abs Immature Granulocytes: 0.01 10*3/uL (ref 0.00–0.07)
Basophils Absolute: 0 10*3/uL (ref 0.0–0.1)
Basophils Relative: 0 %
Eosinophils Absolute: 0.2 10*3/uL (ref 0.0–0.5)
Eosinophils Relative: 4 %
HCT: 33.9 % — ABNORMAL LOW (ref 39.0–52.0)
Hemoglobin: 11.3 g/dL — ABNORMAL LOW (ref 13.0–17.0)
Immature Granulocytes: 0 %
Lymphocytes Relative: 40 %
Lymphs Abs: 2.1 10*3/uL (ref 0.7–4.0)
MCH: 30.1 pg (ref 26.0–34.0)
MCHC: 33.3 g/dL (ref 30.0–36.0)
MCV: 90.4 fL (ref 80.0–100.0)
Monocytes Absolute: 0.7 10*3/uL (ref 0.1–1.0)
Monocytes Relative: 14 %
Neutro Abs: 2.2 10*3/uL (ref 1.7–7.7)
Neutrophils Relative %: 42 %
Platelets: 233 10*3/uL (ref 150–400)
RBC: 3.75 MIL/uL — ABNORMAL LOW (ref 4.22–5.81)
RDW: 12.2 % (ref 11.5–15.5)
WBC: 5.2 10*3/uL (ref 4.0–10.5)
nRBC: 0 % (ref 0.0–0.2)

## 2021-05-16 LAB — RETICULOCYTES
Immature Retic Fract: 12.5 % (ref 2.3–15.9)
RBC.: 3.76 MIL/uL — ABNORMAL LOW (ref 4.22–5.81)
Retic Count, Absolute: 63.2 10*3/uL (ref 19.0–186.0)
Retic Ct Pct: 1.7 % (ref 0.4–3.1)

## 2021-05-16 LAB — COMPREHENSIVE METABOLIC PANEL
ALT: 15 U/L (ref 0–44)
AST: 15 U/L (ref 15–41)
Albumin: 2.8 g/dL — ABNORMAL LOW (ref 3.5–5.0)
Alkaline Phosphatase: 59 U/L (ref 38–126)
Anion gap: 11 (ref 5–15)
BUN: 17 mg/dL (ref 6–20)
CO2: 22 mmol/L (ref 22–32)
Calcium: 8.5 mg/dL — ABNORMAL LOW (ref 8.9–10.3)
Chloride: 101 mmol/L (ref 98–111)
Creatinine, Ser: 1.23 mg/dL (ref 0.61–1.24)
GFR, Estimated: >60 mL/min (ref >60)
Glucose, Bld: 244 mg/dL — ABNORMAL HIGH (ref 70–99)
Potassium: 3.5 mmol/L (ref 3.5–5.1)
Sodium: 134 mmol/L — ABNORMAL LOW (ref 135–145)
Total Bilirubin: 0.5 mg/dL (ref 0.3–1.2)
Total Protein: 7 g/dL (ref 6.5–8.1)

## 2021-05-16 LAB — GLUCOSE, CAPILLARY
Glucose-Capillary: 220 mg/dL — ABNORMAL HIGH (ref 70–99)
Glucose-Capillary: 182 mg/dL — ABNORMAL HIGH (ref 70–99)
Glucose-Capillary: 213 mg/dL — ABNORMAL HIGH (ref 70–99)
Glucose-Capillary: 179 mg/dL — ABNORMAL HIGH (ref 70–99)
Glucose-Capillary: 153 mg/dL — ABNORMAL HIGH (ref 70–99)
Glucose-Capillary: 161 mg/dL — ABNORMAL HIGH (ref 70–99)

## 2021-05-16 LAB — FOLATE: Folate: 15.1 ng/mL (ref >5.9)

## 2021-05-16 LAB — C-REACTIVE PROTEIN: CRP: 2.9 mg/dL — ABNORMAL HIGH (ref <1.0)

## 2021-05-16 LAB — FERRITIN: Ferritin: 260 ng/mL (ref 24–336)

## 2021-05-16 LAB — D-DIMER, QUANTITATIVE: D-Dimer, Quant: 2.66 ug/mL-FEU — ABNORMAL HIGH (ref 0.00–0.50)

## 2021-05-16 LAB — IRON AND TIBC
Iron: 42 ug/dL — ABNORMAL LOW (ref 45–182)
Saturation Ratios: 16 % — ABNORMAL LOW (ref 17.9–39.5)
TIBC: 262 ug/dL (ref 250–450)
UIBC: 220 ug/dL

## 2021-05-16 LAB — VITAMIN B12: Vitamin B-12: 879 pg/mL (ref 180–914)

## 2021-05-16 SURGERY — AMPUTATION, TOE
Anesthesia: General | Site: Toe | Laterality: Left

## 2021-05-16 MED ORDER — LIDOCAINE HCL 2 % IJ SOLN
INTRAMUSCULAR | Status: AC
  Filled 2021-05-16: qty 20

## 2021-05-16 MED ORDER — ALUM & MAG HYDROXIDE-SIMETH 200-200-20 MG/5ML PO SUSP
30.0000 mL | Freq: Four times a day (QID) | ORAL | Status: DC | PRN
  Administered 2021-05-16 – 2021-05-18 (×5): 30 mL via ORAL
  Filled 2021-05-16 (×5): qty 30

## 2021-05-16 MED ORDER — ONDANSETRON HCL 4 MG/2ML IJ SOLN
4.0000 mg | Freq: Once | INTRAMUSCULAR | Status: AC | PRN
  Administered 2021-05-16: 4 mg via INTRAVENOUS

## 2021-05-16 MED ORDER — LACTATED RINGERS IV SOLN: INTRAVENOUS | Status: DC | PRN

## 2021-05-16 MED ORDER — OXYCODONE HCL 5 MG/5ML PO SOLN: 5.0000 mg | Freq: Once | ORAL | Status: DC | PRN

## 2021-05-16 MED ORDER — BUPIVACAINE HCL (PF) 0.5 % IJ SOLN
INTRAMUSCULAR | Status: AC
  Filled 2021-05-16: qty 30

## 2021-05-16 MED ORDER — PROPOFOL 10 MG/ML IV BOLUS
INTRAVENOUS | Status: AC
  Filled 2021-05-16: qty 20

## 2021-05-16 MED ORDER — HYDROMORPHONE HCL 1 MG/ML IJ SOLN: 0.2500 mg | INTRAMUSCULAR | Status: DC | PRN

## 2021-05-16 MED ORDER — BUPIVACAINE HCL (PF) 0.5 % IJ SOLN
INTRAMUSCULAR | Status: DC | PRN
  Administered 2021-05-16: 5 mL

## 2021-05-16 MED ORDER — FENTANYL CITRATE (PF) 250 MCG/5ML IJ SOLN
INTRAMUSCULAR | Status: AC
  Filled 2021-05-16: qty 5

## 2021-05-16 MED ORDER — AMISULPRIDE (ANTIEMETIC) 5 MG/2ML IV SOLN
INTRAVENOUS | Status: AC
  Filled 2021-05-16: qty 4

## 2021-05-16 MED ORDER — HYDROMORPHONE HCL 1 MG/ML IJ SOLN
0.5000 mg | INTRAMUSCULAR | Status: AC | PRN
  Administered 2021-05-16 – 2021-05-18 (×3): 0.5 mg via INTRAVENOUS
  Filled 2021-05-16 (×3): qty 0.5

## 2021-05-16 MED ORDER — ONDANSETRON HCL 4 MG/2ML IJ SOLN
4.0000 mg | Freq: Three times a day (TID) | INTRAMUSCULAR | Status: DC | PRN
  Administered 2021-05-16: 4 mg via INTRAVENOUS
  Filled 2021-05-16 (×2): qty 2

## 2021-05-16 MED ORDER — 0.9 % SODIUM CHLORIDE (POUR BTL) OPTIME
TOPICAL | Status: DC | PRN
  Administered 2021-05-16: 1000 mL

## 2021-05-16 MED ORDER — LIDOCAINE HCL 2 % IJ SOLN
INTRAMUSCULAR | Status: DC | PRN
  Administered 2021-05-16: 5 mL

## 2021-05-16 MED ORDER — FAMOTIDINE 20 MG PO TABS
20.0000 mg | ORAL_TABLET | Freq: Every day | ORAL | Status: DC
  Administered 2021-05-16 – 2021-05-17 (×2): 20 mg via ORAL
  Filled 2021-05-16 (×2): qty 1

## 2021-05-16 MED ORDER — AMISULPRIDE (ANTIEMETIC) 5 MG/2ML IV SOLN
10.0000 mg | Freq: Once | INTRAVENOUS | Status: AC
  Administered 2021-05-16: 10 mg via INTRAVENOUS

## 2021-05-16 MED ORDER — CHLORHEXIDINE GLUCONATE CLOTH 2 % EX PADS: 6.0000 | MEDICATED_PAD | Freq: Once | CUTANEOUS | Status: AC

## 2021-05-16 MED ORDER — MIDAZOLAM HCL 2 MG/2ML IJ SOLN
INTRAMUSCULAR | Status: AC
  Filled 2021-05-16: qty 2

## 2021-05-16 MED ORDER — ACETAMINOPHEN 500 MG PO TABS: 1000.0000 mg | ORAL_TABLET | Freq: Once | ORAL | Status: DC

## 2021-05-16 MED ORDER — OXYCODONE HCL 5 MG PO TABS: 5.0000 mg | ORAL_TABLET | Freq: Once | ORAL | Status: DC | PRN

## 2021-05-16 MED ORDER — PHENYLEPHRINE 40 MCG/ML (10ML) SYRINGE FOR IV PUSH (FOR BLOOD PRESSURE SUPPORT)
PREFILLED_SYRINGE | INTRAVENOUS | Status: DC | PRN
  Administered 2021-05-16: 120 ug via INTRAVENOUS
  Administered 2021-05-16 (×2): 80 ug via INTRAVENOUS
  Administered 2021-05-16: 120 ug via INTRAVENOUS

## 2021-05-16 MED ORDER — ONDANSETRON HCL 4 MG/2ML IJ SOLN
INTRAMUSCULAR | Status: DC | PRN
  Administered 2021-05-16: 4 mg via INTRAVENOUS

## 2021-05-16 MED ORDER — SODIUM CHLORIDE 0.9 % IV SOLN
2.0000 g | Freq: Three times a day (TID) | INTRAVENOUS | Status: DC
  Administered 2021-05-16 – 2021-05-19 (×9): 2 g via INTRAVENOUS
  Filled 2021-05-16 (×10): qty 2

## 2021-05-16 MED ORDER — ONDANSETRON HCL 4 MG/2ML IJ SOLN
INTRAMUSCULAR | Status: AC
  Filled 2021-05-16: qty 2

## 2021-05-16 MED ORDER — METRONIDAZOLE 500 MG PO TABS
500.0000 mg | ORAL_TABLET | Freq: Two times a day (BID) | ORAL | Status: DC
  Administered 2021-05-16 – 2021-05-19 (×6): 500 mg via ORAL
  Filled 2021-05-16 (×6): qty 1

## 2021-05-16 MED ORDER — PROPOFOL 10 MG/ML IV BOLUS
INTRAVENOUS | Status: DC | PRN
  Administered 2021-05-16: 200 mg via INTRAVENOUS

## 2021-05-16 MED ORDER — LORATADINE 10 MG PO TABS
10.0000 mg | ORAL_TABLET | Freq: Every day | ORAL | Status: DC | PRN
  Administered 2021-05-16: 10 mg via ORAL
  Filled 2021-05-16: qty 1

## 2021-05-16 MED ORDER — MIDAZOLAM HCL 2 MG/2ML IJ SOLN
INTRAMUSCULAR | Status: DC | PRN
  Administered 2021-05-16: 2 mg via INTRAVENOUS

## 2021-05-16 MED ORDER — LIDOCAINE 2% (20 MG/ML) 5 ML SYRINGE
INTRAMUSCULAR | Status: DC | PRN
  Administered 2021-05-16: 60 mg via INTRAVENOUS

## 2021-05-16 SURGICAL SUPPLY — 30 items
BAG COUNTER SPONGE SURGICOUNT (BAG) ×2 IMPLANT
BLADE LONG MED 31X9 (MISCELLANEOUS) ×1 IMPLANT
BNDG CONFORM 2 STRL LF (GAUZE/BANDAGES/DRESSINGS) ×2 IMPLANT
BNDG ELASTIC 3X5.8 VLCR STR LF (GAUZE/BANDAGES/DRESSINGS) ×2 IMPLANT
BNDG ESMARK 4X9 LF (GAUZE/BANDAGES/DRESSINGS) ×2 IMPLANT
BNDG GAUZE ELAST 4 BULKY (GAUZE/BANDAGES/DRESSINGS) ×2 IMPLANT
CUFF TOURN SGL QUICK 18X4 (TOURNIQUET CUFF) IMPLANT
DRSG EMULSION OIL 3X3 NADH (GAUZE/BANDAGES/DRESSINGS) ×3 IMPLANT
DURAPREP 26ML APPLICATOR (WOUND CARE) ×2 IMPLANT
ELECT REM PT RETURN 9FT ADLT (ELECTROSURGICAL) ×2
ELECTRODE REM PT RTRN 9FT ADLT (ELECTROSURGICAL) ×1 IMPLANT
GAUZE 4X4 16PLY ~~LOC~~+RFID DBL (SPONGE) ×1 IMPLANT
GAUZE SPONGE 4X4 12PLY STRL (GAUZE/BANDAGES/DRESSINGS) ×2 IMPLANT
GLOVE SURG ENC MOIS LTX SZ8 (GLOVE) ×4 IMPLANT
GOWN STRL REUS W/ TWL LRG LVL3 (GOWN DISPOSABLE) ×1 IMPLANT
GOWN STRL REUS W/ TWL XL LVL3 (GOWN DISPOSABLE) ×1 IMPLANT
GOWN STRL REUS W/TWL LRG LVL3 (GOWN DISPOSABLE) ×1
GOWN STRL REUS W/TWL XL LVL3 (GOWN DISPOSABLE) ×1
KIT BASIN OR (CUSTOM PROCEDURE TRAY) ×2 IMPLANT
NDL HYPO 25X1 1.5 SAFETY (NEEDLE) ×1 IMPLANT
NEEDLE HYPO 25X1 1.5 SAFETY (NEEDLE) ×2 IMPLANT
NS IRRIG 1000ML POUR BTL (IV SOLUTION) IMPLANT
PACK ORTHO EXTREMITY (CUSTOM PROCEDURE TRAY) ×2 IMPLANT
SUCTION FRAZIER HANDLE 10FR (MISCELLANEOUS) ×1
SUCTION TUBE FRAZIER 10FR DISP (MISCELLANEOUS) ×1 IMPLANT
SUT PROLENE 3 0 PS 2 (SUTURE) ×3 IMPLANT
SYR 10ML LL (SYRINGE) IMPLANT
TUBE CONNECTING 12X1/4 (SUCTIONS) ×2 IMPLANT
UNDERPAD 30X36 HEAVY ABSORB (UNDERPADS AND DIAPERS) ×2 IMPLANT
YANKAUER SUCT BULB TIP NO VENT (SUCTIONS) IMPLANT

## 2021-05-16 NOTE — Brief Op Note (Signed)
05/16/2021  1:16 PM  PATIENT:  John [name]  56 y.o. male  PRE-OPERATIVE DIAGNOSIS:  OSTEOMYELITIS LEFT FOOT  POST-OPERATIVE DIAGNOSIS:  OSTEOMYELITIS LEFT FOOT  PROCEDURE:  Procedure(s): AMPUTATION LEFT GREAT  TOE (Left)  SURGEON:  Surgeon(s) and Role:    * Vivi Barrack, DPM - Primary  PHYSICIAN ASSISTANT:   ASSISTANTS: none   ANESTHESIA:   general  EBL:  50ml   BLOOD ADMINISTERED:none  DRAINS: none   LOCAL MEDICATIONS USED:  OTHER 10 cc lidocaine and marcaine plain  SPECIMEN:  Source of Specimen:  toe for pathology  DISPOSITION OF SPECIMEN:  PATHOLOGY  COUNTS:  YES  TOURNIQUET:  * Missing tourniquet times found for documented tourniquets in log: [id number] *  DICTATION: .Dragon Dictation  PLAN OF CARE: Admit to inpatient   PATIENT DISPOSITION:  PACU - hemodynamically stable.   Delay start of Pharmacological VTE agent (>24hrs) due to surgical blood loss or risk of bleeding: no  Intraoperative findings:  Underwent left hallux amputation, no signs of proximal infection. Incision closed without tension. Will plan to keep him for 24-48 hours for IV antibiotics. Will change dressing Friday before discharge. Follow up in 1 week in the office and would recommend discharge with 1 week oral antibiotics.

## 2021-05-16 NOTE — Progress Notes (Signed)
Orthopedic Tech Progress Note Patient Details:  John [name] 1965-06-12 [id number]  Ortho Devices Type of Ortho Device: Darco shoe Ortho Device/Splint Location: LLE Ortho Device/Splint Interventions: Ordered   Post Interventions Patient Tolerated: Well Instructions Provided: Care of device  [name] 05/16/2021, 2:38 PM

## 2021-05-16 NOTE — Anesthesia Preprocedure Evaluation (Addendum)
Anesthesia Evaluation  Patient identified by MRN, date of birth, ID band Patient awake    Reviewed: Allergy & Precautions, NPO status , Patient's Chart, lab work & pertinent test results, reviewed documented beta blocker date and time   Airway Mallampati: III  TM Distance: >3 FB Neck ROM: Full    Dental no notable dental hx. (+) Teeth Intact, Dental Advisory Given   Pulmonary asthma ,    Pulmonary exam normal breath sounds clear to auscultation       Cardiovascular hypertension, Pt. on medications and Pt. on home beta blockers + DVT  Normal cardiovascular exam Rhythm:Regular Rate:Normal     Neuro/Psych negative neurological ROS  negative psych ROS   GI/Hepatic Neg liver ROS, GERD  Medicated and Controlled,  Endo/Other  diabetes, Poorly Controlled, Type 2, Insulin Dependent, Oral Hypoglycemic Agentsa1c 14  Renal/GU Renal InsufficiencyRenal diseaseCKD, Cr 1.23  negative genitourinary   Musculoskeletal Osteo L great toe   Abdominal   Peds  Hematology  (+) Blood dyscrasia, anemia , Hb 11.3   Anesthesia Other Findings   Reproductive/Obstetrics negative OB ROS                           Anesthesia Physical Anesthesia Plan  ASA: 3  Anesthesia Plan: General   Post-op Pain Management: Minimal or no pain anticipated   Induction: Intravenous  PONV Risk Score and Plan: 2 and Ondansetron, Dexamethasone and Treatment may vary due to age or medical condition  Airway Management Planned: LMA  Additional Equipment: None  Intra-op Plan:   Post-operative Plan: Extubation in OR  Informed Consent: I have reviewed the patients History and Physical, chart, labs and discussed the procedure including the risks, benefits and alternatives for the proposed anesthesia with the patient or authorized representative who has indicated his/her understanding and acceptance.     Dental advisory given  Plan  Discussed with: CRNA  Anesthesia Plan Comments:        Anesthesia Quick Evaluation

## 2021-05-16 NOTE — Progress Notes (Signed)
Pharmacy Antibiotic Note  John [name] is a 56 y.o. male admitted on 05/14/2021 with cellulitis.  Pharmacy has been consulted for vancomycin/cefepime dosing.  Patient with a history of asthma, DM, DVT, kidney stones, HTN. Patient presenting with wound on left great toe. Pt reports being sent to ED by podiatry office for evaluation of potential osteomyelitis.  Pt is s/p amputation of left great toe for osteo. Plan for 24-48 hrs post op with IV then 1 wk of PO abx for discharge. Consult to change zosyn to cefepime tonight. Scr 1.23. Consider PO Septra when transition to PO abx for discharge.   Plan: Cefepime 2g IV q8 Vancomycin 1750 mg q24hr (eAUC 505) unless change in renal function Trend WBC, Fever, Renal function, & Clinical course F/u cultures, clinical course, WBC, fever De-escalate when able  Height: 5\' 10"  (177.8 cm) Weight: 78.4 kg (172 lb 13.5 oz) IBW/kg (Calculated) : 73  Temp (24hrs), Avg:97.9 F (36.6 C), Min:97 F (36.1 C), Max:98.9 F (37.2 C)  Recent Labs  Lab 05/14/21 1434 05/14/21 1450 05/15/21 0210 05/16/21 0443  WBC  --  6.3 4.9 5.2  CREATININE  --  1.28* 1.25* 1.23  LATICACIDVEN 1.1  --  0.7  --      Estimated Creatinine Clearance: 70.1 mL/min (by C-G formula based on SCr of 1.23 mg/dL).    No Known Allergies  Antimicrobials this admission: zosyn 2/20 >> 2/22 vancomycin 2/20 >> Cefepime 2/22>>  Microbiology results: 12/5 wound>>serratia pan sensitive except for Augmentin and cefazolin 2/20 BCx x2: ngtd 2/20 BCx x2: ngtd 2/21 Covid 19 pcr: positive  3/21, PharmD, BCIDP, AAHIVP, CPP Infectious Disease Pharmacist 05/16/2021 4:33 PM

## 2021-05-16 NOTE — Progress Notes (Signed)
Patient seen and evaluated at bedside this morning.  Patient had questions regarding surgery versus alternative treatments.  I discussed with him as well as her family member who was present.  We discussed amputation of the toe given the osteomyelitis and there is no recommendation.  Discussed other treatment options including long-term antibiotics and aggressive local wound care.  We discussed pros and cons of each.  After discussion he does agree to proceed with left hallux amputation as scheduled.  We discussed the surgery as well as postoperative course.  Alternatives, risks, complications discussed.  We discussed glucose control to help with healing.   Today, WBC 5.2, Hemoglobin 11.3  Reviewed MRI which shows osteomyelitis of the hallux as the A1c was very high at 14.   Right foot x-ray was negative.   Will proceed with surgery as scheduled.

## 2021-05-16 NOTE — Transfer of Care (Signed)
Immediate Anesthesia Transfer of Care Note  Patient: John [name]  Procedure(s) Performed: AMPUTATION LEFT GREAT  TOE (Left: Toe)  Patient Location: PACU  Anesthesia Type:General  Level of Consciousness: awake, alert  and oriented  Airway & Oxygen Therapy: Patient Spontanous Breathing and Patient connected to face mask oxygen  Post-op Assessment: Report given to RN and Post -op Vital signs reviewed and stable  Post vital signs: Reviewed and stable  Last Vitals:  Vitals Value Taken Time  BP 134/89 05/16/21 1322  Temp    Pulse 70 05/16/21 1325  Resp 13 05/16/21 1324  SpO2 100 % 05/16/21 1325  Vitals shown include unvalidated device data.  Last Pain:  Vitals:   05/16/21 0800  TempSrc:   PainSc: 0-No pain         Complications: No notable events documented.

## 2021-05-16 NOTE — Progress Notes (Signed)
°  Progress Note   Patient: John [name] [id number] DOB: Oct 26, 1965 DOA: 05/14/2021     1 DOS: the patient was seen and examined on 05/16/2021   Brief hospital course: 56 year old past medical history significant for diabetes type 2, hypertension, presents to the ED with increase and persistent drainage from the left great toe.  Symptoms started a month ago.  Patient completed 2 course of antibiotics. MRI of the left foot showed fissure consistent with great toe osteomyelitis.  Patient was incidentally found to have COVID-positive. Dr. Amalia Hailey has been consulted.  Patient underwent left great toe amputation by Dr. Jacqualyn Posey on 05/16/2021.  Assessment and Plan: * Osteomyelitis of great toe of left foot ([place])- (present on admission) Patient presented with worsening drainage from left great toe.   -MRI: Soft tissue swelling of the great toe with sinus tract within the distal lateral aspect of the great toe contacting bone.  Distal aspect of the distal phalanx cortical erosion with diffuse marrow edema throughout the distal phalanx indicating acute osteomyelitis.  Mild diffuse edema throughout the proximal -Continue with IV Vancomycin  And change Zosyn to cefepime and flagyl to avoid AKI.  -Underwent left great toe amputation 2/22. -Dr. Earleen Newport recommended 48 hours of IV antibiotics postsurgery, will need 1 week of oral antibiotics at discharge.  Positive D dimer Doppler lower extremity negative for DVT.  Normocytic anemia Patient seen anemia. B 12 879, iron level 42, ferritin 260 Continue oral iron when infection is controlled. He will also need a screening colonoscopy as an outpatient.  Hyponatremia Pseudohyponatremia. Mild, resolved with IV fluids and correction of hyperglycemia.   COVID-19 virus infection Patient reports mild cough.  Incidentally found to have COVID-positive by PCR. Started on rhythm severe, plan to complete 3 doses. Monitor for hypoxemia.  CKD (chronic kidney disease)  stage 3, GFR 30-59 ml/min (HCC)- (present on admission) CKD stage IIIa.  Creatinine baseline 1.1 Continue to monitor. Stable.   Hyperlipidemia LDL goal <100- (present on admission) Continue with Lipitor  Hypertension- (present on admission) Continue with Coreg, Norvasc  Type 2 diabetes mellitus with hyperglycemia, with long-term current use of insulin (HCC) Continues with Semglee 16 units daily. Continue with a sliding scale insulin Hyperglycemia, uncontrolled. A1c 14.  He will need better outpatient glucose control         Subjective:  He denies leg pain.  Denies leg pain.  Cough stable no worsening cough.  Physical Exam: Vitals:   05/16/21 1323 05/16/21 1335 05/16/21 1350 05/16/21 1516  BP: 134/89 (!) 144/98 (!) 146/97 (!) 159/102  Pulse: 74 71 67 72  Resp: 19 12 15    Temp: (!) 97 F (36.1 C)  (!) 97 F (36.1 C) (!) 97.3 F (36.3 C)  TempSrc:    Oral  SpO2: 100% 99% 100% 100%  Weight:      Height:       General: No acute distress Lung: Clear to auscultation Abdomen: Soft, nontender nondistended no rigidity  Data Reviewed:  BC, anemia panel and Doppler reviewed  Family Communication: Discussed with patient  Disposition: Status is: Inpatient Remains inpatient appropriate because: Needed IV antibiotics post left great toe amputation          Planned Discharge Destination: Home     Time spent: 45 minutes  Author: Elmarie Shiley, MD 05/16/2021 6:07 PM  For on call review [email or web address].

## 2021-05-16 NOTE — Progress Notes (Signed)
Inpatient Diabetes Program Recommendations  AACE/ADA: New Consensus Statement on Inpatient Glycemic Control (2015)  Target Ranges:  Prepandial:   less than 140 mg/dL      Peak postprandial:   less than 180 mg/dL (1-2 hours)      Critically ill patients:  140 - 180 mg/dL   Lab Results  Component Value Date   GLUCAP 220 (H) 05/16/2021   HGBA1C 14.0 (H) 05/15/2021    Review of Glycemic Control  Latest Reference Range & Units 05/15/21 07:38 05/15/21 11:53 05/15/21 17:15 05/15/21 21:16 05/16/21 08:56  Glucose-Capillary 70 - 99 mg/dL 168 (H) 121 (H) 281 (H) 265 (H) 220 (H)   Diabetes history: DM 2 Outpatient Diabetes medications: Tresiba 16 units Current orders for Inpatient glycemic control:  Semglee 16 units Novolog 0-9 units tid  PCP: Dr. Riki Sheer with [facility]  Inpatient Diabetes Program Recommendations:    Fasting glucose >200, NPO for surgery today  -  Increase Semglee to 22 units  Will see this admission  Thanks,  Tama Headings RN, MSN, BC-ADM Inpatient Diabetes Coordinator Team Pager [phone] (8a-5p)

## 2021-05-16 NOTE — Assessment & Plan Note (Signed)
Doppler lower extremity negative for DVT.

## 2021-05-16 NOTE — Op Note (Signed)
PATIENT:  John [name]  56 y.o. male   PRE-OPERATIVE DIAGNOSIS:  OSTEOMYELITIS LEFT FOOT   POST-OPERATIVE DIAGNOSIS:  OSTEOMYELITIS LEFT FOOT   PROCEDURE:  Procedure(s): AMPUTATION LEFT GREAT  TOE (Left)   SURGEON:  Surgeon(s) and Role:    * Vivi Barrack, DPM - Primary   PHYSICIAN ASSISTANT:    ASSISTANTS: none    ANESTHESIA:   general   EBL:  97ml    BLOOD ADMINISTERED:none   DRAINS: none    LOCAL MEDICATIONS USED:  OTHER 10 cc lidocaine and marcaine plain   SPECIMEN:  Source of Specimen:  toe for pathology   DISPOSITION OF SPECIMEN:  PATHOLOGY   COUNTS:  YES   TOURNIQUET:  * Missing tourniquet times found for documented tourniquets in log: [id number] *   DICTATION: .Dragon Dictation   PLAN OF CARE: Admit to inpatient    PATIENT DISPOSITION:  PACU - hemodynamically stable.   Delay start of Pharmacological VTE agent (>24hrs) due to surgical blood loss or risk of bleeding: no  Indications for surgery: 56 year old male was admitted to the hospital from clinic for osteomyelitis of his left hallux.  MRI was also performed which did confirm osteomyelitis.  Due to the infection we discussed amputation versus long-term antibiotics and conservative care.  After discussions with him as well as his family number agreed to proceed with amputation.  We discussed alternatives, risks, complications.  No promises or guarantees given second procedure and all questions were answered the best my ability.  Procedure in detail: The patient was both verbally and visually identified in his hospital room.  He was then transferred directly to the operating room given his COVID-positive status.  While in the OR I confirmed the site and consent was signed.  LMA was placed by anesthesia.  Tourniquet was applied to the left lower extremity making sure to pad all bony prominences but of note it was not inflated during the procedure.  The left lower extremities and scrubbed, prepped, draped in  normal sterile fashion.  Timeout was performed.  10 cc of lidocaine, Marcaine plain was infiltrated in a regional block fashion.  Next a modified racquet shaped incision was made on the first metatarsal phalangeal joint.  Incision was made from skin to bone with a #10 blade scalpel.  At this time the metatarsal phalangeal joints identified the toe was disarticulated and passed off the table and sent to pathology.  The remaining metatarsal head appear to be viable.  It was hard in nature, white in color and no purulence or proximal tracking noted.  The incision was copiously irrigated with saline hemostasis achieved.  The incision was then closed with 3-0 Prolene in a simple interrupted fashion.  Adaptic was applied followed by dry sterile dressing.  He was awoken from anesthesia and found to tolerate the procedure without any complications.  Transferred to PACU vital signs stable vascular status intact  Postoperative course: Recommend remain inpatient for 24 to 48 hours and IV antibiotics and discharged home with 1 week of oral antibiotics.  We will change the dressing prior to discharge.  Weightbearing as tolerated in Darco wedge shoe.

## 2021-05-16 NOTE — Progress Notes (Signed)
Bilateral lower extremity venous duplex has been completed. Preliminary results can be found in CV Proc through chart review.   05/16/21 4:13 PM Olen Cordial RVT

## 2021-05-16 NOTE — Anesthesia Procedure Notes (Signed)
Procedure Name: LMA Insertion Date/Time: 05/16/2021 12:38 PM Performed by: Mayer Camel, CRNA Pre-anesthesia Checklist: Patient identified, Emergency Drugs available, Suction available and Patient being monitored Patient Re-evaluated:Patient Re-evaluated prior to induction Oxygen Delivery Method: Circle System Utilized Preoxygenation: Pre-oxygenation with 100% oxygen Induction Type: IV induction Ventilation: Mask ventilation without difficulty LMA: LMA inserted LMA Size: 4.0 Number of attempts: 1 Airway Equipment and Method: Bite block Placement Confirmation: positive ETCO2 Tube secured with: Tape Dental Injury: Teeth and Oropharynx as per pre-operative assessment

## 2021-05-16 NOTE — Anesthesia Postprocedure Evaluation (Signed)
Anesthesia Post Note  Patient: John [name]  Procedure(s) Performed: AMPUTATION LEFT GREAT  TOE (Left: Toe)     Patient location during evaluation: PACU Anesthesia Type: General Level of consciousness: awake and alert, oriented and patient cooperative Pain management: pain level controlled Vital Signs Assessment: post-procedure vital signs reviewed and stable Respiratory status: spontaneous breathing, nonlabored ventilation and respiratory function stable Cardiovascular status: blood pressure returned to baseline and stable Postop Assessment: no apparent nausea or vomiting Anesthetic complications: no   No notable events documented.  Last Vitals:  Vitals:   05/16/21 1350 05/16/21 1516  BP: (!) 146/97 (!) 159/102  Pulse: 67 72  Resp: 15   Temp: (!) 36.1 C (!) 36.3 C  SpO2: 100% 100%    Last Pain:  Vitals:   05/16/21 1600  TempSrc:   PainSc: 0-No pain                 Lannie Fields

## 2021-05-17 ENCOUNTER — Inpatient Hospital Stay (HOSPITAL_COMMUNITY): Payer: BC Managed Care – PPO

## 2021-05-17 ENCOUNTER — Encounter (HOSPITAL_COMMUNITY): Payer: Self-pay | Admitting: Podiatry

## 2021-05-17 DIAGNOSIS — R112 Nausea with vomiting, unspecified: Secondary | ICD-10-CM

## 2021-05-17 DIAGNOSIS — M869 Osteomyelitis, unspecified: Secondary | ICD-10-CM | POA: Diagnosis not present

## 2021-05-17 LAB — C-REACTIVE PROTEIN: CRP: 2.2 mg/dL — ABNORMAL HIGH (ref <1.0)

## 2021-05-17 LAB — COMPREHENSIVE METABOLIC PANEL
ALT: 18 U/L (ref 0–44)
AST: 20 U/L (ref 15–41)
Albumin: 3.3 g/dL — ABNORMAL LOW (ref 3.5–5.0)
Alkaline Phosphatase: 61 U/L (ref 38–126)
Anion gap: 10 (ref 5–15)
BUN: 12 mg/dL (ref 6–20)
CO2: 25 mmol/L (ref 22–32)
Calcium: 8.9 mg/dL (ref 8.9–10.3)
Chloride: 100 mmol/L (ref 98–111)
Creatinine, Ser: 1 mg/dL (ref 0.61–1.24)
GFR, Estimated: >60 mL/min (ref >60)
Glucose, Bld: 159 mg/dL — ABNORMAL HIGH (ref 70–99)
Potassium: 4.1 mmol/L (ref 3.5–5.1)
Sodium: 135 mmol/L (ref 135–145)
Total Bilirubin: 1 mg/dL (ref 0.3–1.2)
Total Protein: 8.3 g/dL — ABNORMAL HIGH (ref 6.5–8.1)

## 2021-05-17 LAB — D-DIMER, QUANTITATIVE: D-Dimer, Quant: 2.64 ug/mL-FEU — ABNORMAL HIGH (ref 0.00–0.50)

## 2021-05-17 LAB — CBC WITH DIFFERENTIAL/PLATELET
Abs Immature Granulocytes: 0.02 10*3/uL (ref 0.00–0.07)
Basophils Absolute: 0 10*3/uL (ref 0.0–0.1)
Basophils Relative: 0 %
Eosinophils Absolute: 0 10*3/uL (ref 0.0–0.5)
Eosinophils Relative: 0 %
HCT: 37.4 % — ABNORMAL LOW (ref 39.0–52.0)
Hemoglobin: 12.8 g/dL — ABNORMAL LOW (ref 13.0–17.0)
Immature Granulocytes: 0 %
Lymphocytes Relative: 27 %
Lymphs Abs: 1.5 10*3/uL (ref 0.7–4.0)
MCH: 30.1 pg (ref 26.0–34.0)
MCHC: 34.2 g/dL (ref 30.0–36.0)
MCV: 88 fL (ref 80.0–100.0)
Monocytes Absolute: 0.4 10*3/uL (ref 0.1–1.0)
Monocytes Relative: 7 %
Neutro Abs: 3.8 10*3/uL (ref 1.7–7.7)
Neutrophils Relative %: 66 %
Platelets: 226 10*3/uL (ref 150–400)
RBC: 4.25 MIL/uL (ref 4.22–5.81)
RDW: 12.1 % (ref 11.5–15.5)
WBC: 5.8 10*3/uL (ref 4.0–10.5)
nRBC: 0 % (ref 0.0–0.2)

## 2021-05-17 LAB — GLUCOSE, CAPILLARY
Glucose-Capillary: 152 mg/dL — ABNORMAL HIGH (ref 70–99)
Glucose-Capillary: 137 mg/dL — ABNORMAL HIGH (ref 70–99)
Glucose-Capillary: 143 mg/dL — ABNORMAL HIGH (ref 70–99)
Glucose-Capillary: 200 mg/dL — ABNORMAL HIGH (ref 70–99)

## 2021-05-17 MED ORDER — SODIUM CHLORIDE 0.9 % IV SOLN
100.0000 mg | Freq: Every day | INTRAVENOUS | Status: AC
  Administered 2021-05-17: 100 mg via INTRAVENOUS
  Filled 2021-05-17: qty 20

## 2021-05-17 MED ORDER — SODIUM CHLORIDE 0.9 % IV SOLN: INTRAVENOUS | Status: DC

## 2021-05-17 MED ORDER — FAMOTIDINE 20 MG PO TABS
20.0000 mg | ORAL_TABLET | Freq: Two times a day (BID) | ORAL | Status: DC
  Administered 2021-05-17 – 2021-05-19 (×4): 20 mg via ORAL
  Filled 2021-05-17 (×4): qty 1

## 2021-05-17 MED ORDER — SENNOSIDES-DOCUSATE SODIUM 8.6-50 MG PO TABS
1.0000 | ORAL_TABLET | Freq: Two times a day (BID) | ORAL | Status: DC
  Administered 2021-05-17 – 2021-05-19 (×4): 1 via ORAL
  Filled 2021-05-17 (×4): qty 1

## 2021-05-17 MED ORDER — PROCHLORPERAZINE EDISYLATE 10 MG/2ML IJ SOLN
5.0000 mg | Freq: Four times a day (QID) | INTRAMUSCULAR | Status: DC | PRN
Start: 2021-05-17 — End: 2021-05-19
  Administered 2021-05-17 – 2021-05-18 (×3): 5 mg via INTRAVENOUS
  Filled 2021-05-17 (×3): qty 2

## 2021-05-17 NOTE — Progress Notes (Signed)
Subjective: POD # 1 s/p left partial first ray amputation.  Overnight he was having some nausea and upset stomach.  I was notified office that I could order Zofran however recommended hospitalist to be notified as well.  He states he is doing much better today and symptoms have resolved.  No significant pain in his foot at this time.  He denies any fevers or chills.  No chest pain or shortness of breath.  Objective: AAO x3, NAD-fianc present. DP/PT pulses palpable bilaterally, CRT less than 3 seconds There was some bloody strikethrough on the bandage.  I changed the dressing today.  No active bleeding noted.  Incision well coapted with sutures intact.  Mild edema present.  No significant cellulitis.  There is no fluctuation or crepitation.  No malodor.  Superficial wound present on the medial second PIPJ without any probing, and or tunneling.  No fluctuance or crepitation. No pain with calf compression, swelling, warmth, erythema  Assessment: POD # 1 s/p left partial 1st ray amputation  Plan: Dressing changed today.  Incision is well coapted.  I cleansed the wound with saline and Betadine was applied followed by Xeroform and a dressing.  Weightbearing as tolerated in Darco wedge shoe but needs to limit the amount of weightbearing.  Encouraged elevation.  Wound culture from clinic is still preliminary.  Current podiatry standpoint can likely be discharged tomorrow with oral antibiotics, Augmentin with close follow-up.  He will follow-up in both clinic next week.  Return precautions discussed with him and discussed importance of glucose control to help with healing.  Ovid Curd, DPM

## 2021-05-17 NOTE — Plan of Care (Signed)

## 2021-05-17 NOTE — Progress Notes (Signed)
Inpatient Diabetes Program Recommendations  AACE/ADA: New Consensus Statement on Inpatient Glycemic Control (2015)  Target Ranges:  Prepandial:   less than 140 mg/dL      Peak postprandial:   less than 180 mg/dL (1-2 hours)      Critically ill patients:  140 - 180 mg/dL   Lab Results  Component Value Date   GLUCAP 137 (H) 05/17/2021   HGBA1C 14.0 (H) 05/15/2021    Review of Glycemic Control  Latest Reference Range & Units 05/15/21 07:38 05/15/21 11:53 05/15/21 17:15 05/15/21 21:16 05/16/21 08:56  Glucose-Capillary 70 - 99 mg/dL 544 (H) 920 (H) 100 (H) 265 (H) 220 (H)   Diabetes history: DM 2 Outpatient Diabetes medications: Tresiba 16 units Current orders for Inpatient glycemic control:  Semglee 16 units Novolog 0-9 units tid  PCP: Dr. Arva Chafe with [facility] with pt at bedside regarding A1c level of 14% and glucose control at home. Pt reports not checking glucose that often, he says he "knows when he is high due to his symptoms." Discussed how the infection increased his trends and may have made him tolerant of a higher level. Encouraged glucose checks for wound healing and encouraged compliance with current Tresiba dose for home as inpatient trends look within goal. Pt reports compliance with Tresiba, however A1c level would not coincide with this.  Thanks,  Christena Deem RN, MSN, BC-ADM Inpatient Diabetes Coordinator Team Pager [phone] (8a-5p)

## 2021-05-17 NOTE — Assessment & Plan Note (Addendum)
He started to vomit yesterday after Sx. Thought to be related post anesthesia. Continue to vomit 2/23. KUB negative for obstruction.  Compasine PRN for nausea.  Resolved.

## 2021-05-17 NOTE — Progress Notes (Signed)
Progress Note   Patient: John [name] [id number] DOB: 03-06-1966 DOA: 05/14/2021     2 DOS: the patient was seen and examined on 05/17/2021   Brief hospital course: 56 year old past medical history significant for diabetes type 2, hypertension, presents to the ED with increase and persistent drainage from the left great toe.  Symptoms started a month ago.  Patient completed 2 course of antibiotics. MRI of the left foot showed fissure consistent with great toe osteomyelitis.  Patient was incidentally found to have COVID-positive. Dr. Amalia Hailey has been consulted.  Patient underwent left great toe amputation by Dr. Jacqualyn Posey on 05/16/2021.  Assessment and Plan: * Osteomyelitis of great toe of left foot ([place])- (present on admission) Patient presented with worsening drainage from left great toe.   -MRI: Soft tissue swelling of the great toe with sinus tract within the distal lateral aspect of the great toe contacting bone.  Distal aspect of the distal phalanx cortical erosion with diffuse marrow edema throughout the distal phalanx indicating acute osteomyelitis.  Mild diffuse edema throughout the proximal -Continue with IV Vancomycin  And cefepime and flagyl for 48 hours post sx. -Underwent left great toe amputation 2/22. -He will need 1 week of oral antibiotics at discharge.  Nausea & vomiting He started to vomit yesterday after Sx. Thought to be related post anesthesia. Continue to vomit today. Will proceed with KUB.  Compasine PRN for nausea.   Positive D dimer Doppler lower extremity negative for DVT.  Normocytic anemia Patient Iron deficiency  anemia. B 12 879, iron level 42, ferritin 260 Start  oral iron when infection is controlled. He will also need a screening colonoscopy as an outpatient.  Hyponatremia Pseudohyponatremia. Mild, resolved with IV fluids and correction of hyperglycemia.   COVID-19 virus infection Patient reports mild cough.  Incidentally found to have COVID-positive  by PCR. Completed 3 days Remdesivir 2/23. Monitor for hypoxemia.  CKD (chronic kidney disease) stage 3, GFR 30-59 ml/min (HCC)- (present on admission) CKD stage IIIa.  Creatinine baseline 1.1 Continue to monitor. Stable.   Hyperlipidemia LDL goal <100- (present on admission) Continue with Lipitor  Hypertension- (present on admission) Continue with Coreg, Norvasc  Type 2 diabetes mellitus with hyperglycemia, with long-term current use of insulin (HCC) Continues with Semglee 16 units daily. Continue with a sliding scale insulin Hyperglycemia, uncontrolled. A1c 14.  He will need better outpatient glucose control         Subjective:  He report nausea and vomiting last night and stomach pain. Symptoms resolved when I saw him.  Then in the afternoon, received message from nurse patient has been intermittently vomiting.   Physical Exam: Vitals:   05/16/21 2100 05/17/21 0724 05/17/21 1500 05/17/21 1501  BP: (!) 167/92 135/87 (!) 150/90   Pulse: 76 81  75  Resp: 16 17    Temp: 97.8 F (36.6 C) (!) 97.4 F (36.3 C) 98.2 F (36.8 C)   TempSrc: Oral Oral Oral   SpO2: 99% 99%  100%  Weight:      Height:       General; NAD Abdomen; soft, NT, ND Extremity: Left foot with dressing,.   Data Reviewed:  Cbc and Bmet  Family Communication: care discussed with patient.   Disposition: Status is: Inpatient Remains inpatient appropriate because: needs IV antibiotics post sx          Planned Discharge Destination: Home     Time spent: 45 minutes  Author: Elmarie Shiley, MD 05/17/2021 3:22 PM  For on call  review [email or web address].

## 2021-05-17 NOTE — TOC Initial Note (Signed)
Transition of Care [facility]) - Initial/Assessment Note    Patient Details  Name: John [name] MRN: [id number] Date of Birth: 09/19/1965  Transition [facility]) CM/SW Contact:    Sharin Mons, RN Phone Number: [phone] 05/17/2021, 8:06 AM  Clinical Narrative:                 Admitted with osteomyelitis of great toe of left foot . COVID (+), no symptoms.    - s/p L great toe amputation 2/22 From home with fiance',John [name]. PTA independent with ADL's, no DME usage. Pt states fiance' works from home, very supportive.  Pt states has no problems affording copay for Rx meds. Pt has transportation to home once d/c.  TOC following for TOC needs...   Expected Discharge Plan:  (resides with fiance') Barriers to Discharge: Continued Medical Work up   Patient Goals and CMS Choice        Expected Discharge Plan and Services Expected Discharge Plan:  (resides with fiance')       Living arrangements for the past 2 months: Single Family Home      Prior Living Arrangements/Services Living arrangements for the past 2 months: [place] Lives with:: Significant Other Patient language and need for interpreter reviewed:: Yes Do you feel safe going back to the place where you live?: Yes      Need for Family Participation in Patient Care: Yes (Comment) Care giver support system in place?: Yes (comment)   Criminal Activity/Legal Involvement Pertinent to Current Situation/Hospitalization: No - Comment as needed  Activities of Daily Living Home Assistive Devices/Equipment: None ADL Screening (condition at time of admission) Patient's cognitive ability adequate to safely complete daily activities?: Yes Is the patient deaf or have difficulty hearing?: No Does the patient have difficulty seeing, even when wearing glasses/contacts?: No Does the patient have difficulty concentrating, remembering, or making decisions?: No Patient able to express need for assistance with ADLs?:  Yes Does the patient have difficulty dressing or bathing?: No Independently performs ADLs?: Yes (appropriate for developmental age) Does the patient have difficulty walking or climbing stairs?: No Weakness of Legs: Left Weakness of Arms/Hands: None  Permission Sought/Granted   Permission granted to share information with : Yes, Verbal Permission Granted  Share Information with NAME: John [name] [facility])  [phone]           Emotional Assessment Appearance:: Appears stated age Attitude/Demeanor/Rapport: Engaged Affect (typically observed): Pleasant Orientation: : Oriented to Self, Oriented to Place, Oriented to  Time, Oriented to Situation Alcohol / Substance Use: Not Applicable Psych Involvement: No (comment)  Admission diagnosis:  Osteomyelitis of left foot, unspecified type ([place]) [M86.9] Osteomyelitis of great toe of left foot ([place]) [M86.9] Patient Active Problem List   Diagnosis Date Noted   Positive D dimer 05/16/2021   COVID-19 virus infection 05/15/2021   Hyponatremia 05/15/2021   Normocytic anemia 05/15/2021   Osteomyelitis of great toe of left foot ([place]) 05/14/2021   CKD (chronic kidney disease) stage 3, GFR 30-59 ml/min ([place]) 05/14/2021   Diabetes mellitus due to underlying condition with diabetic autonomic neuropathy, unspecified whether long term insulin use ([place]) 07/24/2020   Hyperlipidemia LDL goal <100 12/06/2015   Hypertension 04/06/2012   Type 2 diabetes mellitus with hyperglycemia, with long-term current use of insulin ([place]) 03/20/2012   History of DVT (deep vein thrombosis) 03/19/2012   PCP:  John Pal, DO Pharmacy:   [id number] - [place], [place] [address] [place]  [address] Phone: [phone]  Fax: [phone]  [facility] [address] [address] Phone: [phone] Fax: [phone]     Social Determinants of Health (SDOH) Interventions     Readmission Risk Interventions No flowsheet data found.

## 2021-05-18 ENCOUNTER — Telehealth: Payer: Self-pay | Admitting: Podiatry

## 2021-05-18 DIAGNOSIS — M869 Osteomyelitis, unspecified: Secondary | ICD-10-CM | POA: Diagnosis not present

## 2021-05-18 LAB — GLUCOSE, CAPILLARY
Glucose-Capillary: 102 mg/dL — ABNORMAL HIGH (ref 70–99)
Glucose-Capillary: 91 mg/dL (ref 70–99)
Glucose-Capillary: 160 mg/dL — ABNORMAL HIGH (ref 70–99)
Glucose-Capillary: 71 mg/dL (ref 70–99)

## 2021-05-18 LAB — COMPREHENSIVE METABOLIC PANEL
ALT: 18 U/L (ref 0–44)
AST: 21 U/L (ref 15–41)
Albumin: 2.9 g/dL — ABNORMAL LOW (ref 3.5–5.0)
Alkaline Phosphatase: 54 U/L (ref 38–126)
Anion gap: 8 (ref 5–15)
BUN: 13 mg/dL (ref 6–20)
CO2: 22 mmol/L (ref 22–32)
Calcium: 8.4 mg/dL — ABNORMAL LOW (ref 8.9–10.3)
Chloride: 107 mmol/L (ref 98–111)
Creatinine, Ser: 0.97 mg/dL (ref 0.61–1.24)
GFR, Estimated: >60 mL/min (ref >60)
Glucose, Bld: 101 mg/dL — ABNORMAL HIGH (ref 70–99)
Potassium: 3.5 mmol/L (ref 3.5–5.1)
Sodium: 137 mmol/L (ref 135–145)
Total Bilirubin: 1 mg/dL (ref 0.3–1.2)
Total Protein: 7.2 g/dL (ref 6.5–8.1)

## 2021-05-18 LAB — CBC WITH DIFFERENTIAL/PLATELET
Abs Immature Granulocytes: 0.03 10*3/uL (ref 0.00–0.07)
Basophils Absolute: 0 10*3/uL (ref 0.0–0.1)
Basophils Relative: 0 %
Eosinophils Absolute: 0 10*3/uL (ref 0.0–0.5)
Eosinophils Relative: 0 %
HCT: 33.8 % — ABNORMAL LOW (ref 39.0–52.0)
Hemoglobin: 11.5 g/dL — ABNORMAL LOW (ref 13.0–17.0)
Immature Granulocytes: 0 %
Lymphocytes Relative: 21 %
Lymphs Abs: 1.5 10*3/uL (ref 0.7–4.0)
MCH: 30.3 pg (ref 26.0–34.0)
MCHC: 34 g/dL (ref 30.0–36.0)
MCV: 89.2 fL (ref 80.0–100.0)
Monocytes Absolute: 0.6 10*3/uL (ref 0.1–1.0)
Monocytes Relative: 8 %
Neutro Abs: 5.1 10*3/uL (ref 1.7–7.7)
Neutrophils Relative %: 71 %
Platelets: 218 10*3/uL (ref 150–400)
RBC: 3.79 MIL/uL — ABNORMAL LOW (ref 4.22–5.81)
RDW: 12.3 % (ref 11.5–15.5)
WBC: 7.2 10*3/uL (ref 4.0–10.5)
nRBC: 0 % (ref 0.0–0.2)

## 2021-05-18 LAB — D-DIMER, QUANTITATIVE: D-Dimer, Quant: 2.36 ug/mL-FEU — ABNORMAL HIGH (ref 0.00–0.50)

## 2021-05-18 LAB — SURGICAL PATHOLOGY

## 2021-05-18 LAB — C-REACTIVE PROTEIN: CRP: 3.3 mg/dL — ABNORMAL HIGH (ref <1.0)

## 2021-05-18 MED ORDER — BISACODYL 10 MG RE SUPP
10.0000 mg | Freq: Once | RECTAL | Status: AC
  Administered 2021-05-18: 10 mg via RECTAL
  Filled 2021-05-18: qty 1

## 2021-05-18 NOTE — Telephone Encounter (Signed)
-----   Message from Vivi Barrack, DPM sent at 05/18/2021  8:23 AM EST ----- Patient is in the hospital with likely discharge today. Can you schedule POV #1 s/p partial 1st ray amputation for next week? It was a Dr. Logan Bores patient and I did surgery so can follow up with either of [place].

## 2021-05-18 NOTE — Plan of Care (Signed)

## 2021-05-18 NOTE — Progress Notes (Signed)
Progress Note   Patient: John [name] [id number] DOB: 13-Jul-1965 DOA: 05/14/2021     3 DOS: the patient was seen and examined on 05/18/2021   Brief hospital course: 56 year old past medical history significant for diabetes type 2, hypertension, presents to the ED with increase and persistent drainage from the left great toe.  Symptoms started a month ago.  Patient completed 2 course of antibiotics. MRI of the left foot showed fissure consistent with great toe osteomyelitis.  Patient was incidentally found to have COVID-positive. Dr. Logan Bores has been consulted.  Patient underwent left great toe amputation by Dr. Ardelle Anton on 05/16/2021.  Having issues with intermittent vomiting.   Assessment and Plan: * Osteomyelitis of great toe of left foot (HCC)- (present on admission) Patient presented with worsening drainage from left great toe.   -MRI: Soft tissue swelling of the great toe with sinus tract within the distal lateral aspect of the great toe contacting bone.  Distal aspect of the distal phalanx cortical erosion with diffuse marrow edema throughout the distal phalanx indicating acute osteomyelitis.  Mild diffuse edema throughout the proximal -Continue with IV Vancomycin  And cefepime and flagyl for 48 hours post sx. -Underwent left great toe amputation 2/22. -He will need 1 week of oral antibiotics at discharge. Discharge home tomorrow if he is able to tolerates oral antibiotics.   Nausea & vomiting He started to vomit yesterday after Sx. Thought to be related post anesthesia. Continue to vomit 2/23. KUB negative for obstruction.  Compasine PRN for nausea.  No vomiting today, but didn't eat breakfast. If persist could consider Reglan to treat for gastroparesis.  Positive D dimer Doppler lower extremity negative for DVT.  Normocytic anemia Patient Iron deficiency  anemia. B 12 879, iron level 42, ferritin 260 Start  oral iron when infection is controlled. He will also need a screening  colonoscopy as an outpatient.  Hyponatremia Pseudohyponatremia. Mild, resolved with IV fluids and correction of hyperglycemia.   COVID-19 virus infection Patient reports mild cough.  Incidentally found to have COVID-positive by PCR. Completed 3 days Remdesivir 2/23. Monitor for hypoxemia. COVID-19 Labs  Recent Labs    05/16/21 0443 05/17/21 0302 05/18/21 0810  DDIMER 2.66* 2.64* 2.36*  FERRITIN 260  --   --   CRP 2.9* 2.2* 3.3*    Lab Results  Component Value Date   SARSCOV2NAA POSITIVE (A) 05/15/2021    CKD (chronic kidney disease) stage 3, GFR 30-59 ml/min (HCC)- (present on admission) CKD stage IIIa.  Creatinine baseline 1.1 Continue to monitor. Stable.   Hyperlipidemia LDL goal <100- (present on admission) Continue with Lipitor  Hypertension- (present on admission) Continue with Coreg, Norvasc.  Type 2 diabetes mellitus with hyperglycemia, with long-term current use of insulin (HCC) Continues with Semglee 16 units daily. Continue with a sliding scale insulin Hyperglycemia, uncontrolled. A1c 14.  He will need better outpatient glucose control         Subjective: he is feeling fine. He just vomit at night. He has not been eating in the morning. Denies abdominal pain.   Physical Exam: Vitals:   05/17/21 0724 05/17/21 1500 05/17/21 1501 05/17/21 2237  BP: 135/87 (!) 150/90  140/73  Pulse: 81 75 75 75  Resp: 17 17  18   Temp: (!) 97.4 F (36.3 C) 98.2 F (36.8 C)  98.5 F (36.9 C)  TempSrc: Oral Oral  Oral  SpO2: 99% 100% 100% 100%  Weight:      Height:       General; NAD  Lung; CTA Extremity: Left with with dressing.   Data Reviewed:  CBC and Bmet   Family Communication: Care discussed with patient.   Disposition: Status is: Inpatient Remains inpatient appropriate because: require IV antibiotics and still vomiting.           Planned Discharge Destination: Home     Time spent: 45 minutes  Author: Elmarie Shiley,  MD 05/18/2021 5:45 PM  For on call review [email or web address].

## 2021-05-18 NOTE — Telephone Encounter (Signed)
Left message for pt that per Dr Ardelle Anton pt needed a post op follow up appt next week and I have scheduled him for 3.1.2023 @ 245  with Dr Logan Bores and asked pt to call if this did not work for him.

## 2021-05-18 NOTE — Progress Notes (Signed)
Subjective: POD # 2 s/p left partial first ray amputation.  He is still having some nausea/vomiting today. Otherwise he has no fevers, chills. No significant pain.   Objective: AAO x3, NAD DP/PT pulses palpable bilaterally, CRT less than 3 seconds Dressing clean, dry, intact. No strike through. No pain.  No pain with calf compression, swelling, warmth, erythema  Assessment: POD # 2 s/p left partial 1st ray amputation  Plan: From podiatry standpoint he is able to be discharged with oral antibiotics for a week pending medical clearance.  We will plan on changing the dressing tomorrow if still in the hospital otherwise he will follow-up with me in the office next week.  Ovid Curd, DPM

## 2021-05-19 DIAGNOSIS — M869 Osteomyelitis, unspecified: Secondary | ICD-10-CM | POA: Diagnosis not present

## 2021-05-19 LAB — COMPREHENSIVE METABOLIC PANEL
ALT: 17 U/L (ref 0–44)
AST: 21 U/L (ref 15–41)
Albumin: 2.6 g/dL — ABNORMAL LOW (ref 3.5–5.0)
Alkaline Phosphatase: 51 U/L (ref 38–126)
Anion gap: 10 (ref 5–15)
BUN: 12 mg/dL (ref 6–20)
CO2: 21 mmol/L — ABNORMAL LOW (ref 22–32)
Calcium: 8.2 mg/dL — ABNORMAL LOW (ref 8.9–10.3)
Chloride: 106 mmol/L (ref 98–111)
Creatinine, Ser: 1.21 mg/dL (ref 0.61–1.24)
GFR, Estimated: >60 mL/min (ref >60)
Glucose, Bld: 186 mg/dL — ABNORMAL HIGH (ref 70–99)
Potassium: 3.5 mmol/L (ref 3.5–5.1)
Sodium: 137 mmol/L (ref 135–145)
Total Bilirubin: 0.9 mg/dL (ref 0.3–1.2)
Total Protein: 6.4 g/dL — ABNORMAL LOW (ref 6.5–8.1)

## 2021-05-19 LAB — WOUND CULTURE
MICRO NUMBER:: 13042728
SPECIMEN QUALITY:: ADEQUATE

## 2021-05-19 LAB — CULTURE, BLOOD (ROUTINE X 2)
Culture: NO GROWTH
Culture: NO GROWTH
Special Requests: ADEQUATE

## 2021-05-19 LAB — GLUCOSE, CAPILLARY
Glucose-Capillary: 147 mg/dL — ABNORMAL HIGH (ref 70–99)
Glucose-Capillary: 206 mg/dL — ABNORMAL HIGH (ref 70–99)

## 2021-05-19 LAB — D-DIMER, QUANTITATIVE: D-Dimer, Quant: 2.02 ug/mL-FEU — ABNORMAL HIGH (ref 0.00–0.50)

## 2021-05-19 LAB — CBC WITH DIFFERENTIAL/PLATELET
Abs Immature Granulocytes: 0.05 10*3/uL (ref 0.00–0.07)
Basophils Absolute: 0 10*3/uL (ref 0.0–0.1)
Basophils Relative: 0 %
Eosinophils Absolute: 0.1 10*3/uL (ref 0.0–0.5)
Eosinophils Relative: 1 %
HCT: 32.7 % — ABNORMAL LOW (ref 39.0–52.0)
Hemoglobin: 11 g/dL — ABNORMAL LOW (ref 13.0–17.0)
Immature Granulocytes: 1 %
Lymphocytes Relative: 27 %
Lymphs Abs: 1.8 10*3/uL (ref 0.7–4.0)
MCH: 30.1 pg (ref 26.0–34.0)
MCHC: 33.6 g/dL (ref 30.0–36.0)
MCV: 89.6 fL (ref 80.0–100.0)
Monocytes Absolute: 0.6 10*3/uL (ref 0.1–1.0)
Monocytes Relative: 10 %
Neutro Abs: 4 10*3/uL (ref 1.7–7.7)
Neutrophils Relative %: 61 %
Platelets: 207 10*3/uL (ref 150–400)
RBC: 3.65 MIL/uL — ABNORMAL LOW (ref 4.22–5.81)
RDW: 12.4 % (ref 11.5–15.5)
WBC: 6.5 10*3/uL (ref 4.0–10.5)
nRBC: 0 % (ref 0.0–0.2)

## 2021-05-19 LAB — C-REACTIVE PROTEIN: CRP: 4.3 mg/dL — ABNORMAL HIGH (ref <1.0)

## 2021-05-19 MED ORDER — AMOXICILLIN-POT CLAVULANATE 875-125 MG PO TABS: 1.0000 | ORAL_TABLET | Freq: Two times a day (BID) | ORAL | 0 refills | Status: AC

## 2021-05-19 MED ORDER — ONETOUCH ULTRASOFT LANCETS MISC: 12 refills | Status: DC

## 2021-05-19 MED ORDER — METFORMIN HCL ER 500 MG PO TB24: 1000.0000 mg | ORAL_TABLET | Freq: Two times a day (BID) | ORAL | 2 refills | Status: DC

## 2021-05-19 MED ORDER — AMLODIPINE BESYLATE 5 MG PO TABS: 5.0000 mg | ORAL_TABLET | Freq: Every day | ORAL | 2 refills | Status: DC

## 2021-05-19 MED ORDER — BLOOD GLUCOSE MONITOR KIT: PACK | 0 refills | Status: DC

## 2021-05-19 MED ORDER — NOVOFINE PLUS PEN NEEDLE 32G X 4 MM MISC: 1 refills | Status: DC

## 2021-05-19 MED ORDER — TRAMADOL HCL 50 MG PO TABS: 50.0000 mg | ORAL_TABLET | Freq: Four times a day (QID) | ORAL | 0 refills | Status: AC | PRN

## 2021-05-19 MED ORDER — PROCHLORPERAZINE MALEATE 5 MG PO TABS: 5.0000 mg | ORAL_TABLET | Freq: Three times a day (TID) | ORAL | 0 refills | Status: DC | PRN

## 2021-05-19 MED ORDER — GLUCOSE BLOOD VI STRP: ORAL_STRIP | 12 refills | Status: DC

## 2021-05-19 MED ORDER — SENNOSIDES-DOCUSATE SODIUM 8.6-50 MG PO TABS: 1.0000 | ORAL_TABLET | Freq: Two times a day (BID) | ORAL | 0 refills | Status: DC

## 2021-05-19 MED ORDER — TRESIBA FLEXTOUCH 100 UNIT/ML ~~LOC~~ SOPN: 16.0000 [IU] | PEN_INJECTOR | Freq: Every day | SUBCUTANEOUS | 2 refills | Status: DC

## 2021-05-19 MED ORDER — FAMOTIDINE 20 MG PO TABS: 20.0000 mg | ORAL_TABLET | Freq: Two times a day (BID) | ORAL | 0 refills | Status: DC

## 2021-05-19 NOTE — TOC Transition Note (Signed)
Transition of Care [facility]) - CM/SW Discharge Note   Patient Details  Name: John [name] MRN: [id number] Date of Birth: May 27, 1965  Transition of Care [facility]) CM/SW Contact:  Bess Kinds, RN Phone Number: [phone] 05/19/2021, 1:21 PM   Clinical Narrative:     Notified by nursing that patient is recommended to have a RW. Referral to [employer] to deliver to the room.   Final next level of care: Home/Self Care Barriers to Discharge: No Barriers Identified   Patient Goals and CMS Choice        Discharge Placement                       Discharge Plan and Services                DME Arranged: Walker rolling DME Agency: [employer] Date DME Agency Contacted: 05/19/21 Time DME Agency Contacted: 1321 Representative spoke with at DME Agency: [facility] HH Arranged: NA HH Agency: NA        Social Determinants of Health (SDOH) Interventions     Readmission Risk Interventions No flowsheet data found.

## 2021-05-19 NOTE — Evaluation (Signed)
Physical Therapy Evaluation and Discharge Patient Details Name: John [name] MRN: [id number] DOB: 1966-01-06 Today's Date: 05/19/2021  History of Present Illness  Pt is a 56 y/o male admitted secondary to L great toe infection. Pt is s/p L great toe amputation on 2/22. Pt also covid +.  PMH includes PMH includes HTN and DM.  Clinical Impression  Pt admitted secondary to problem above with deficits below. Pt at a supervision to mod I level with all mobility tasks using RW this session. No overt LOB noted. Educated about using RW to offweight LLE as needed. Pt reports fiance will be able to assist and plans to stay downstairs at d/c. No further acute skilled PT needs at this time. Will sign off. If needs change, please re-consult.         Recommendations for follow up therapy are one component of a multi-disciplinary discharge planning process, led by the attending physician.  Recommendations may be updated based on patient status, additional functional criteria and insurance authorization.  Follow Up Recommendations No PT follow up    Assistance Recommended at Discharge Intermittent Supervision/Assistance  Patient can return home with the following  Help with stairs or ramp for entrance;Assist for transportation;Assistance with cooking/housework    Equipment Recommendations Rolling walker (2 wheels)  Recommendations for Other Services       Functional Status Assessment Patient has had a recent decline in their functional status and demonstrates the ability to make significant improvements in function in a reasonable and predictable amount of time.     Precautions / Restrictions Precautions Precautions: Fall Required Braces or Orthoses: Other Brace Other Brace: post op shoe Restrictions Weight Bearing Restrictions: Yes LLE Weight Bearing: Weight bearing as tolerated (in post op shoe) Other Position/Activity Restrictions: Limit long distance ambulation per podiatry note       Mobility  Bed Mobility Overal bed mobility: Modified Independent                  Transfers Overall transfer level: Needs assistance Equipment used: Rolling walker (2 wheels) Transfers: Sit to/from Stand Sit to Stand: Supervision           General transfer comment: Supervision for safety. Cues for hand placement    Ambulation/Gait Ambulation/Gait assistance: Supervision Gait Distance (Feet): 50 Feet Assistive device: Rolling walker (2 wheels) Gait Pattern/deviations: Step-to pattern, Decreased step length - right, Decreased step length - left Gait velocity: Decreased     General Gait Details: No LOB noted with use of RW. Cues for sequencing and to use UEs for offweighting LLE  Stairs            Wheelchair Mobility    Modified Rankin (Stroke Patients Only)       Balance Overall balance assessment: Mild deficits observed, not formally tested                                           Pertinent Vitals/Pain Pain Assessment Pain Assessment: Faces Faces Pain Scale: Hurts little more Pain Location: L foot Pain Descriptors / Indicators: Grimacing, Guarding Pain Intervention(s): Limited activity within patient's tolerance, Monitored during session, Repositioned    Home Living Family/patient expects to be discharged to:: Private residence Living Arrangements: Spouse/significant other Available Help at Discharge: Family;Available 24 hours/day Type of Home: House Home Access: Level entry       Home Layout: Two level;1/2 bath on main level;Able  to live on main level with bedroom/bathroom Home Equipment: None      Prior Function Prior Level of Function : Independent/Modified Independent                     Hand Dominance        Extremity/Trunk Assessment   Upper Extremity Assessment Upper Extremity Assessment: Overall WFL for tasks assessed    Lower Extremity Assessment Lower Extremity Assessment: LLE  deficits/detail LLE Deficits / Details: Deficits consistent with post op pain and weakness    Cervical / Trunk Assessment Cervical / Trunk Assessment: Normal  Communication   Communication: No difficulties  Cognition Arousal/Alertness: Awake/alert Behavior During Therapy: WFL for tasks assessed/performed Overall Cognitive Status: Within Functional Limits for tasks assessed                                          General Comments General comments (skin integrity, edema, etc.): Verbally reviewed safe stair management, but pt reports he plans to stay downstairs    Exercises     Assessment/Plan    PT Assessment Patient does not need any further PT services  PT Problem List         PT Treatment Interventions      PT Goals (Current goals can be found in the Care Plan section)  Acute Rehab PT Goals Patient Stated Goal: to go home PT Goal Formulation: With patient Time For Goal Achievement: 05/19/21 Potential to Achieve Goals: Good    Frequency       Co-evaluation               AM-PAC PT "6 Clicks" Mobility  Outcome Measure Help needed turning from your back to your side while in a flat bed without using bedrails?: None Help needed moving from lying on your back to sitting on the side of a flat bed without using bedrails?: None Help needed moving to and from a bed to a chair (including a wheelchair)?: None Help needed standing up from a chair using your arms (e.g., wheelchair or bedside chair)?: None Help needed to walk in hospital room?: None Help needed climbing 3-5 steps with a railing? : A Little 6 Click Score: 23    End of Session Equipment Utilized During Treatment: Gait belt Activity Tolerance: Patient tolerated treatment well Patient left: in bed;with call bell/phone within reach Nurse Communication: Mobility status PT Visit Diagnosis: Other abnormalities of gait and mobility (R26.89);Pain Pain - Right/Left: Left Pain - part of body:  Ankle and joints of foot    Time: 1120-1135 PT Time Calculation (min) (ACUTE ONLY): 15 min   Charges:   PT Evaluation $PT Eval Low Complexity: 1 Low          Cindee Salt, DPT  Acute Rehabilitation Services  Pager: [phone] Office: [phone]   Lehman Prom 05/19/2021, 12:16 PM

## 2021-05-19 NOTE — Progress Notes (Addendum)
Subjective: POD # 3 s/p left partial first ray amputation.  States he is feeling better this morning.  No further nausea or vomiting.  He states he is ready go home.  No fevers or chills.  Occasional discomfort but pain medication resolves pain.  Objective: AAO x3, NAD DP/PT pulses palpable bilaterally, CRT less than 3 seconds Incision clean, dry, intact.  There is minimal bloody strikethrough no drainage and there is no active bleeding noted.  Mild edema but there is no significant cellulitis.  No fluctuation or crepitation.  No malodor.  Ulceration present on the second toe medial aspect without any probing, undermining or tunneling. No pain with calf compression, swelling, warmth, erythema     Pathology:  A. TOE, LEFT GREAT, AMPUTATION:  Gangrenous cutaneous ulcer with underlying gangrenous cellulitis  Underlying reactive bone with changes compatible with chronic  osteomyelitis  Proximal bone margin free of inflammation  Skin and soft tissue margin grossly viable   Assessment: POD # 3 s/p left partial 1st ray amputation  Plan: Dressing changed today.  Betadine is been over the incision followed by Xeroform and dry sterile dressing.  He can leave the dressing intact until follow-up.  I ordered Darco wedge shoe.  Encouraged elevation.  Although he is weightbearing as tolerated I discussed to limit the amount of walking.  Encouraged glucose control and elevation.  Based on the culture from clinic we will do Augmentin upon discharge.  Pathology shows proximal bone margin free of inflammation.  From podiatry standpoint he can be discharged today with close follow-up.  He already has a scheduled follow-up for next week.  He needs to monitor the left big toe as well given the pre-ulcerative area. Negative x-rays upon admission. Will also follow as an outpatient   Celesta Gentile, DPM

## 2021-05-19 NOTE — Discharge Summary (Signed)
Physician Discharge Summary   Patient: John [name] MRN: [id number] DOB: 1965/11/12  Admit date:     05/14/2021  Discharge date: 05/19/21  Discharge Physician: Elmarie Shiley   PCP: [name], DO   Recommendations at discharge:    Needs close follow up with Dr Jacqualyn Posey for further care S/P amputation. Great toe.  Needs further management of DM   Discharge Diagnoses: Principal Problem:   Osteomyelitis of great toe of left foot (HCC) Active Problems:   Type 2 diabetes mellitus with hyperglycemia, with long-term current use of insulin (HCC)   Hypertension   Hyperlipidemia LDL goal <100   CKD (chronic kidney disease) stage 3, GFR 30-59 ml/min (HCC)   COVID-19 virus infection   Hyponatremia   Normocytic anemia   Positive D dimer   Nausea & vomiting  Resolved Problems:   * No resolved hospital problems. *   Hospital Course: 56 year old past medical history significant for diabetes type 2, hypertension, presents to the ED with increase and persistent drainage from the left great toe.  Symptoms started a month ago.  Patient completed 2 course of antibiotics. MRI of the left foot showed fissure consistent with great toe osteomyelitis.  Patient was incidentally found to have COVID-positive. Dr. Amalia Hailey has been consulted.  Patient underwent left great toe amputation by Dr. Jacqualyn Posey on 05/16/2021.  Tolerating diet. Vomiting resolved.   Assessment and Plan: * Osteomyelitis of great toe of left foot ([place])- (present on admission) Patient presented with worsening drainage from left great toe.   -MRI: Soft tissue swelling of the great toe with sinus tract within the distal lateral aspect of the great toe contacting bone.  Distal aspect of the distal phalanx cortical erosion with diffuse marrow edema throughout the distal phalanx indicating acute osteomyelitis.  Mild diffuse edema throughout the proximal -Continue with IV Vancomycin  And cefepime and flagyl for 48 hours post  sx. -Underwent left great toe amputation 2/22. -He will need 1 week of oral antibiotics at discharge. Discharge home today, on Augmentin.  Wound culture grew enterococcus sensitive to Ampicillin.   Nausea & vomiting He started to vomit yesterday after Sx. Thought to be related post anesthesia. Continue to vomit 2/23. KUB negative for obstruction.  Compasine PRN for nausea.  Resolved.   Positive D dimer Doppler lower extremity negative for DVT.  Normocytic anemia Patient Iron deficiency  anemia. B 12 879, iron level 42, ferritin 260 Start  oral iron when infection is controlled. He will also need a screening colonoscopy as an outpatient.  Hyponatremia Pseudohyponatremia. Mild, resolved with IV fluids and correction of hyperglycemia.   COVID-19 virus infection Patient reports mild cough.  Incidentally found to have COVID-positive by PCR. Completed 3 days Remdesivir 2/23. Monitor for hypoxemia. COVID-19 Labs  Recent Labs    05/16/21 0443 05/17/21 0302 05/18/21 0810  DDIMER 2.66* 2.64* 2.36*  FERRITIN 260  --   --   CRP 2.9* 2.2* 3.3*    Lab Results  Component Value Date   SARSCOV2NAA POSITIVE (A) 05/15/2021    CKD (chronic kidney disease) stage 3, GFR 30-59 ml/min (HCC)- (present on admission) CKD stage IIIa.  Creatinine baseline 1.1 Continue to monitor. Stable.   Hyperlipidemia LDL goal <100- (present on admission) Continue with Lipitor  Hypertension- (present on admission) Continue with Coreg, Norvasc.  Type 2 diabetes mellitus with hyperglycemia, with long-term current use of insulin (HCC) Continues with Semglee 16 units daily. Continue with a sliding scale insulin Hyperglycemia, uncontrolled. A1c 14.  He will  need better outpatient glucose control            Consultants: Dr Jacqualyn Posey.  Procedures performed: Left first toe amputation.  Disposition: Home Diet recommendation:  Discharge Diet Orders (From admission, onward)     Start     Ordered    05/19/21 0000  Diet - low sodium heart healthy        05/19/21 1104           Carb modified diet  DISCHARGE MEDICATION: Allergies as of 05/19/2021       Reactions   Zofran [ondansetron Hcl] Nausea Only   Abdominal pain        Medication List     STOP taking these medications    chlorthalidone 25 MG tablet Commonly known as: HYGROTON   gentamicin cream 0.1 % Commonly known as: GARAMYCIN   glyBURIDE 5 MG tablet Commonly known as: DIABETA   levofloxacin 750 MG tablet Commonly known as: Levaquin   ondansetron 4 MG tablet Commonly known as: ZOFRAN   pantoprazole 40 MG tablet Commonly known as: PROTONIX       TAKE these medications    albuterol 108 (90 Base) MCG/ACT inhaler Commonly known as: VENTOLIN HFA Inhale 2 puffs into the lungs every 6 (six) hours as needed for wheezing or shortness of breath.   amLODipine 5 MG tablet Commonly known as: NORVASC Take 1 tablet (5 mg total) by mouth daily.   amoxicillin-clavulanate 875-125 MG tablet Commonly known as: Augmentin Take 1 tablet by mouth 2 (two) times daily for 7 days.   aspirin 81 MG chewable tablet Chew 81 mg by mouth daily.   atorvastatin 20 MG tablet Commonly known as: LIPITOR Take 1 tablet (20 mg total) by mouth daily.   blood glucose meter kit and supplies Kit Dispense based on patient and insurance preference. Use up to four times daily as directed. (FOR ICD-9 250.00, 250.01).   carvedilol 25 MG tablet Commonly known as: COREG Take 1 tablet (25 mg total) by mouth 2 (two) times daily with a meal.   famotidine 20 MG tablet Commonly known as: PEPCID Take 1 tablet (20 mg total) by mouth 2 (two) times daily.   glucose blood test strip Use as instructed, Check blood sugars twice daily   levocetirizine 5 MG tablet Commonly known as: XYZAL Take 1 tablet (5 mg total) by mouth every evening. What changed:  when to take this reasons to take this   lisinopril 20 MG tablet Commonly known  as: ZESTRIL Take 1 tablet (20 mg total) by mouth daily.   metFORMIN 500 MG 24 hr tablet Commonly known as: GLUCOPHAGE-XR Take 2 tablets (1,000 mg total) by mouth in the morning and at bedtime.   NovoFine Plus Pen Needle 32G X 4 MM Misc Generic drug: Insulin Pen Needle Use daily to insulin   onetouch ultrasoft lancets Use as instructed, Check blood sugars twice daily   prochlorperazine 5 MG tablet Commonly known as: COMPAZINE Take 1 tablet (5 mg total) by mouth every 8 (eight) hours as needed for nausea or vomiting.   senna-docusate 8.6-50 MG tablet Commonly known as: Senokot-S Take 1 tablet by mouth 2 (two) times daily.   traMADol 50 MG tablet Commonly known as: Ultram Take 1 tablet (50 mg total) by mouth every 6 (six) hours as needed for up to 3 days.   Tyler Aas FlexTouch 100 UNIT/ML FlexTouch Pen Generic drug: insulin degludec Inject 16 Units into the skin at bedtime.  Durable Medical Equipment  (From admission, onward)           Start     Ordered   05/19/21 1312  For home use only DME Walker rolling  Once       Question Answer Comment  Walker: With [place] Wheels   Patient needs a walker to treat with the following condition Decreased functional mobility and endurance      05/19/21 1312            Follow-up Information     [name], DO Follow up.   Specialty: Family Medicine Contact information: [address] [place] [address] [phone]                 Discharge Exam: Danley Danker Weights   05/15/21 0150  Weight: 78.4 kg   General NAD Left foot with dressing.   Condition at discharge: stable  The results of significant diagnostics from this hospitalization (including imaging, microbiology, ancillary and laboratory) are listed below for reference.   Imaging Studies: CT ABDOMEN PELVIS WO CONTRAST  Result Date: 04/26/2021 CLINICAL DATA:  "Knots in stomach" for the past 4 days. Nausea  and vomiting. EXAM: CT ABDOMEN AND PELVIS WITHOUT CONTRAST TECHNIQUE: Multidetector CT imaging of the abdomen and pelvis was performed following the standard protocol without IV contrast. RADIATION DOSE REDUCTION: This exam was performed according to the departmental dose-optimization program which includes automated exposure control, adjustment of the mA and/or kV according to patient size and/or use of iterative reconstruction technique. COMPARISON:  None. FINDINGS: Lower chest: No acute abnormality.  Borderline cardiomegaly. Hepatobiliary: No focal liver abnormality is seen. No gallstones, gallbladder wall thickening, or biliary dilatation. Pancreas: Unremarkable. No pancreatic ductal dilatation or surrounding inflammatory changes. Spleen: Normal in size without focal abnormality. Adrenals/Urinary Tract: Adrenal glands are unremarkable. Kidneys are normal, without renal calculi, focal lesion, or hydronephrosis. Bladder is unremarkable. Stomach/Bowel: Stomach is within normal limits. Appendix appears normal. No evidence of bowel wall thickening, distention, or inflammatory changes. Vascular/Lymphatic: No significant vascular findings are present. Enlarged bilateral inguinal lymph nodes measuring up to 0.9 cm in short axis on the left and 1.4 cm on right. Reproductive: Prostate is unremarkable. The right testicle is within the inguinal canal. Other: Tiny fat containing umbilical hernia. No free fluid or pneumoperitoneum. Musculoskeletal: No acute or significant osseous findings. Midline upper anterior abdominal wall sebaceous cyst measuring 1.7 cm. IMPRESSION: 1. No acute intra-abdominal process. 2. Mildly enlarged enlarged bilateral inguinal lymph nodes are nonspecific, but presumably reactive given lack of additional lymphadenopathy elsewhere in the abdomen and pelvis. Electronically Signed   By: Titus Dubin M.D.   On: 04/26/2021 13:15   MR FOOT LEFT W WO CONTRAST  Result Date: 05/14/2021 CLINICAL DATA:   Non diabetic patient with foot swelling. Osteomyelitis suspected. Left great toe wound first appeared about 5 months ago. Podiatrist today diagnosed osteomyelitis of great toe. EXAM: MRI OF THE LEFT FOREFOOT WITHOUT AND WITH CONTRAST TECHNIQUE: Multiplanar, multisequence MR imaging of the left foot was performed both before and after administration of intravenous contrast. CONTRAST:  7.55m GADAVIST GADOBUTROL 1 MMOL/ML IV SOLN COMPARISON:  Left foot radiographs 02/26/2021 FINDINGS: Bones/Joint/Cartilage There is mild-to-moderate erosion of the distal aspect of the great toe distal phalanx. Diffuse marrow edema throughout the great toe distal phalanx. More mild diffuse edema throughout the great toe proximal phalanx. There is moderate joint space narrowing and medial cuneiform greater than base of the first metatarsal marrow edema likely degenerative.  Likely degenerative marrow edema is also seen within the lateral navicular and intermediate cuneiform with cartilage thinning at the navicular-cuneiform and second tarsometatarsal joints. Ligaments: The Lisfranc ligament complex is intact. The plantar plates appear intact. Muscles and Tendons Visualized flexor and extensor tendons are unremarkable. Soft tissues There is diffuse high-grade swelling of the great toe. There is a sinus tract within the distal lateral aspect of the great toe contacting bone. No rim enhancing abscess is seen. Moderate to high-grade dorsal foot subcutaneous fat swelling. IMPRESSION: Soft tissue swelling of the great toe with a sinus tract within the distal lateral aspect of the great toe contacting bone. Distal aspect of the distal phalanx cortical erosion with diffuse marrow edema throughout the distal phalanx indicating acute osteomyelitis. There is also mild diffuse edema throughout the proximal phalanx of the great toe which may reflect reactive change versus early osteomyelitis. Electronically Signed   By: Yvonne Kendall M.D.   On:  05/14/2021 19:00   DG CHEST PORT 1 VIEW  Result Date: 05/15/2021 CLINICAL DATA:  Infection in the left great toe.  COVID-19. EXAM: PORTABLE CHEST 1 VIEW COMPARISON:  PA Lat 04/25/2017 FINDINGS: There is mild cardiomegaly. Mediastinal contours are within normal limits with a slightly elevated right hemidiaphragm. No vascular congestion is seen. Both lungs are clear. The visualized skeletal structures are unremarkable. IMPRESSION: No active disease.  Mild cardiomegaly. Electronically Signed   By: Telford Nab M.D.   On: 05/15/2021 06:28   DG Abd Portable 1V  Result Date: 05/17/2021 CLINICAL DATA:  Vomiting EXAM: PORTABLE ABDOMEN - 1 VIEW COMPARISON:  CT 04/26/2021 FINDINGS: Mild increased small and large bowel gas without obstructive pattern. No radiopaque calculi. Mild stool in the colon. IMPRESSION: Negative. Electronically Signed   By: Donavan Foil M.D.   On: 05/17/2021 16:16   DG Foot Complete Left  Result Date: 05/16/2021 CLINICAL DATA:  Status post amputation. EXAM: LEFT FOOT - COMPLETE 3+ VIEW COMPARISON:  MRI 05/14/2021 FINDINGS: Status post amputation of the left great toe. No complicating features are identified. The other bony structures are intact. No destructive bony findings to suggest other sites of osteomyelitis. IMPRESSION: Status post amputation of the left great toe. Electronically Signed   By: Marijo Sanes M.D.   On: 05/16/2021 14:22   DG Foot Complete Right  Result Date: 05/15/2021 CLINICAL DATA:  Wound check.  Osteomyelitis great toe EXAM: RIGHT FOOT COMPLETE - 3+ VIEW COMPARISON:  None FINDINGS: Negative for osteomyelitis of the great toe. No fracture. No significant arthropathy Osteomyelitis is noted of the left great toe from yesterday. IMPRESSION: Negative right foot. Electronically Signed   By: Franchot Gallo M.D.   On: 05/15/2021 19:31   DG Foot Complete Right  Result Date: 05/14/2021 Please see detailed radiograph report in office note.  VAS [place] LOWER EXTREMITY  VENOUS (DVT)  Result Date: 05/16/2021  Lower Venous DVT Study Patient Name:  OLANDO WILLEMS  Date of Exam:   05/16/2021 Medical Rec #: [id number]     Accession #:    [id number] Date of Birth: [birth year]      Patient Gender: M Patient Age:   38 years Exam Location[facility]  [facility] Procedure:      VAS [place] LOWER EXTREMITY VENOUS (DVT) Referring Phys: Jerald Kief Arayah Krouse --------------------------------------------------------------------------------  Indications: Edema.  Risk Factors: Surgery. Comparison Study: No prior studies. Performing Technologist: Oliver Hum RVT  Examination Guidelines: A complete evaluation includes B-mode imaging, spectral Doppler, color Doppler, and power Doppler as needed of all accessible  portions of each vessel. Bilateral testing is considered an integral part of a complete examination. Limited examinations for reoccurring indications may be performed as noted. The reflux portion of the exam is performed with the patient in reverse Trendelenburg.  +---------+---------------+---------+-----------+----------+--------------+  RIGHT     Compressibility Phasicity Spontaneity Properties Thrombus Aging  +---------+---------------+---------+-----------+----------+--------------+  CFV       Full            Yes       Yes                                    +---------+---------------+---------+-----------+----------+--------------+  SFJ       Full                                                             +---------+---------------+---------+-----------+----------+--------------+  FV Prox   Full                                                             +---------+---------------+---------+-----------+----------+--------------+  FV Mid    Full                                                             +---------+---------------+---------+-----------+----------+--------------+  FV Distal Full                                                              +---------+---------------+---------+-----------+----------+--------------+  PFV       Full                                                             +---------+---------------+---------+-----------+----------+--------------+  POP       Full            Yes       Yes                                    +---------+---------------+---------+-----------+----------+--------------+  PTV       Full                                                             +---------+---------------+---------+-----------+----------+--------------+  PERO  Full                                                             +---------+---------------+---------+-----------+----------+--------------+   +---------+---------------+---------+-----------+----------+--------------+  LEFT      Compressibility Phasicity Spontaneity Properties Thrombus Aging  +---------+---------------+---------+-----------+----------+--------------+  CFV       Full            Yes       Yes                                    +---------+---------------+---------+-----------+----------+--------------+  SFJ       Full                                                             +---------+---------------+---------+-----------+----------+--------------+  FV Prox   Full                                                             +---------+---------------+---------+-----------+----------+--------------+  FV Mid    Full                                                             +---------+---------------+---------+-----------+----------+--------------+  FV Distal Full                                                             +---------+---------------+---------+-----------+----------+--------------+  PFV       Full                                                             +---------+---------------+---------+-----------+----------+--------------+  POP       Full            Yes       Yes                                     +---------+---------------+---------+-----------+----------+--------------+  PTV       Full                                                             +---------+---------------+---------+-----------+----------+--------------+  PERO      Full                                                             +---------+---------------+---------+-----------+----------+--------------+     Summary: RIGHT: - There is no evidence of deep vein thrombosis in the lower extremity.  - No cystic structure found in the popliteal fossa.  LEFT: - There is no evidence of deep vein thrombosis in the lower extremity.  - No cystic structure found in the popliteal fossa.  *See table(s) above for measurements and observations. Electronically signed by Harold Barban MD on 05/16/2021 at 10:34:43 PM.    Final     Microbiology: Results for orders placed or performed during the hospital encounter of 05/14/21  Blood culture (routine x 2)     Status: None   Collection Time: 05/14/21  1:33 PM   Specimen: BLOOD  Result Value Ref Range Status   Specimen Description BLOOD BLOOD RIGHT ARM  Final   Special Requests   Final    BOTTLES DRAWN AEROBIC AND ANAEROBIC Blood Culture results may not be optimal due to an inadequate volume of blood received in culture bottles   Culture   Final    NO GROWTH 5 DAYS Performed at [facility], [place] [address] [place], [place] [address]    Report Status 05/19/2021 FINAL  Final  Blood culture (routine x 2)     Status: None   Collection Time: 05/14/21  1:33 PM   Specimen: BLOOD  Result Value Ref Range Status   Specimen Description BLOOD BLOOD RIGHT HAND  Final   Special Requests   Final    BOTTLES DRAWN AEROBIC AND ANAEROBIC Blood Culture adequate volume   Culture   Final    NO GROWTH 5 DAYS Performed at [facility], [place] [address], [place], [place] [address]    Report Status 05/19/2021 FINAL  Final  Resp Panel by RT-PCR (Flu A&B, Covid) Nasopharyngeal Swab     Status: Abnormal    Collection Time: 05/15/21 12:48 AM   Specimen: Nasopharyngeal Swab; Nasopharyngeal(NP) swabs in vial transport medium  Result Value Ref Range Status   SARS Coronavirus 2 by RT PCR POSITIVE (A) NEGATIVE Final    Comment: (NOTE) SARS-CoV-2 target nucleic acids are DETECTED.  The SARS-CoV-2 RNA is generally detectable in upper respiratory specimens during the acute phase of infection. Positive results are indicative of the presence of the identified virus, but do not rule out bacterial infection or co-infection with other pathogens not detected by the test. Clinical correlation with patient history and other diagnostic information is necessary to determine patient infection status. The expected result is Negative.  Fact Sheet for Patients: EntrepreneurPulse.com.au  Fact Sheet for Healthcare Providers: IncredibleEmployment.be  This test is not yet approved or cleared by the [place] FDA and  has been authorized for detection and/or diagnosis of SARS-CoV-2 by FDA under an Emergency Use Authorization (EUA).  This EUA will remain in effect (meaning this test can be used) for the duration of  the COVID-19 declaration under Section 564(b)(1) of the A ct, 21 U.S.C. section 360bbb-3(b)(1), unless the authorization is terminated or revoked sooner.     Influenza A by PCR NEGATIVE NEGATIVE Final   Influenza B by PCR NEGATIVE NEGATIVE Final  Comment: (NOTE) The Xpert Xpress SARS-CoV-2/FLU/RSV plus assay is intended as an aid in the diagnosis of influenza from Nasopharyngeal swab specimens and should not be used as a sole basis for treatment. Nasal washings and aspirates are unacceptable for Xpert Xpress SARS-CoV-2/FLU/RSV testing.  Fact Sheet for Patients: EntrepreneurPulse.com.au  Fact Sheet for Healthcare Providers: IncredibleEmployment.be  This test is not yet approved or cleared by the [place] FDA  and has been authorized for detection and/or diagnosis of SARS-CoV-2 by FDA under an Emergency Use Authorization (EUA). This EUA will remain in effect (meaning this test can be used) for the duration of the COVID-19 declaration under Section 564(b)(1) of the Act, 21 U.S.C. section 360bbb-3(b)(1), unless the authorization is terminated or revoked.  Performed at [facility], [place] [address] [place], [place] [address]     Labs: CBC: Recent Labs  Lab 05/14/21 1450 05/15/21 0210 05/16/21 0443 05/17/21 0302 05/18/21 0810 05/19/21 0029  WBC 6.3 4.9 5.2 5.8 7.2 6.5  NEUTROABS 4.1  --  2.2 3.8 5.1 4.0  HGB 12.4* 11.1* 11.3* 12.8* 11.5* 11.0*  HCT 36.6* 33.9* 33.9* 37.4* 33.8* 32.7*  MCV 90.4 90.6 90.4 88.0 89.2 89.6  PLT 251 233 233 226 218 704   Basic Metabolic Panel: Recent Labs  Lab 05/15/21 0210 05/16/21 0443 05/17/21 0302 05/18/21 0810 05/19/21 0029  NA 137 134* 135 137 137  K 3.5 3.5 4.1 3.5 3.5  CL 104 101 100 107 106  CO2 _0 21*  GLUCOSE 124* 244* 159* 101* 186*  BUN _1 CREATININE 1.25* 1.23 1.00 0.97 1.21  CALCIUM 8.9 8.5* 8.9 8.4* 8.2*   Liver Function Tests: Recent Labs  Lab 05/14/21 1450 05/16/21 0443 05/17/21 0302 05/18/21 0810 05/19/21 0029  AST _2 ALT _3 ALKPHOS 66 59 61 54 51  BILITOT 0.7 0.5 1.0 1.0 0.9  PROT 8.6* 7.0 8.3* 7.2 6.4*  ALBUMIN 3.5 2.8* 3.3* 2.9* 2.6*   CBG: Recent Labs  Lab 05/18/21 1130 05/18/21 1707 05/18/21 2100 05/19/21 0726 05/19/21 1144  GLUCAP 91 160* 71 147* 206*    Discharge time spent: greater than 30 minutes.  Signed: Elmarie Shiley, MD [facility] 05/19/2021

## 2021-05-19 NOTE — Evaluation (Signed)
Occupational Therapy Evaluation Patient Details Name: John [name] MRN: [id number] DOB: November 23, 1965 Today's Date: 05/19/2021   History of Present Illness Pt is a 56 y/o male admitted secondary to L great toe infection. Pt is s/p L great toe amputation on 2/22. Pt also covid +.  PMH includes PMH includes HTN and DM.   Clinical Impression   Pt reports independence at baseline with ADLs and functional mobility. Currently pt supervision for transfers with RW, mod I for bed mobility. Pt demonstrates ability to perform toileting, LB dressing, and simulated walk-in shower transfer with supervision. Pt presenting with impairments listed below, however has no acute OT needs at this time, will s/o. Recommend d/c home with assistance.     Recommendations for follow up therapy are one component of a multi-disciplinary discharge planning process, led by the attending physician.  Recommendations may be updated based on patient status, additional functional criteria and insurance authorization.   Follow Up Recommendations  No OT follow up    Assistance Recommended at Discharge Intermittent Supervision/Assistance  Patient can return home with the following A little help with bathing/dressing/bathroom;Help with stairs or ramp for entrance    Functional Status Assessment  Patient has had a recent decline in their functional status and demonstrates the ability to make significant improvements in function in a reasonable and predictable amount of time.  Equipment Recommendations  Tub/shower seat    Recommendations for Other Services       Precautions / Restrictions Precautions Precautions: Fall Required Braces or Orthoses: Other Brace Other Brace: darco shoe Restrictions Weight Bearing Restrictions: Yes LLE Weight Bearing: Weight bearing as tolerated (in darco shoe) Other Position/Activity Restrictions: Limit long distance ambulation per podiatry note      Mobility Bed Mobility Overal bed  mobility: Modified Independent             General bed mobility comments: sits EOB without use of bedrails    Transfers Overall transfer level: Needs assistance Equipment used: Rolling walker (2 wheels) Transfers: Sit to/from Stand Sit to Stand: Supervision           General transfer comment: Supervision for safety. Cues for hand placement      Balance Overall balance assessment: Mild deficits observed, not formally tested                                         ADL either performed or assessed with clinical judgement   ADL Overall ADL's : Needs assistance/impaired Eating/Feeding: Independent;Sitting   Grooming: Independent;Sitting   Upper Body Bathing: Sitting;Supervision/ safety   Lower Body Bathing: Sitting/lateral leans;Supervison/ safety   Upper Body Dressing : Supervision/safety;Sitting   Lower Body Dressing: Supervision/safety;Sit to/from stand   Toilet Transfer: Supervision/safety;Rolling walker (2 wheels);Ambulation;Regular Toilet   Toileting- Architect and Hygiene: Supervision/safety;Sit to/from stand   Tub/ Shower Transfer: Supervision/safety;Rolling walker (2 wheels);Walk-in shower   Functional mobility during ADLs: Supervision/safety;Rolling walker (2 wheels)       Vision   Vision Assessment?: No apparent visual deficits     Perception     Praxis      Pertinent Vitals/Pain Pain Assessment Pain Assessment: No/denies pain     Hand Dominance     Extremity/Trunk Assessment Upper Extremity Assessment Upper Extremity Assessment: Overall WFL for tasks assessed   Lower Extremity Assessment Lower Extremity Assessment: Defer to PT evaluation LLE Deficits / Details: Deficits consistent with post op pain and  weakness   Cervical / Trunk Assessment Cervical / Trunk Assessment: Normal   Communication Communication Communication: No difficulties   Cognition Arousal/Alertness: Awake/alert Behavior During  Therapy: WFL for tasks assessed/performed Overall Cognitive Status: Within Functional Limits for tasks assessed                                       General Comments  pt states he plans to bathe at sink for first few weeks    Exercises     Shoulder Instructions      Home Living Family/patient expects to be discharged to:: Private residence Living Arrangements: Spouse/significant other Available Help at Discharge: Family;Available 24 hours/day Type of Home: House Home Access: Level entry     Home Layout: Two level;1/2 bath on main level;Able to live on main level with bedroom/bathroom     Bathroom Shower/Tub: Tub/shower unit;Walk-in shower   Bathroom Toilet: Standard     Home Equipment: None          Prior Functioning/Environment Prior Level of Function : Independent/Modified Independent             Mobility Comments: no AD use ADLs Comments: does IADLs        OT Problem List: Impaired balance (sitting and/or standing)      OT Treatment/Interventions:      OT Goals(Current goals can be found in the care plan section) Acute Rehab OT Goals Patient Stated Goal: none stated OT Goal Formulation: With patient Time For Goal Achievement: 06/02/21 Potential to Achieve Goals: Good  OT Frequency:      Co-evaluation              AM-PAC OT "6 Clicks" Daily Activity     Outcome Measure Help from another person eating meals?: None Help from another person taking care of personal grooming?: None Help from another person toileting, which includes using toliet, bedpan, or urinal?: None Help from another person bathing (including washing, rinsing, drying)?: A Little Help from another person to put on and taking off regular upper body clothing?: None Help from another person to put on and taking off regular lower body clothing?: A Little 6 Click Score: 22   End of Session Equipment Utilized During Treatment: Rolling walker (2 wheels) Nurse  Communication: Mobility status  Activity Tolerance: Patient tolerated treatment well Patient left: in bed;with call bell/phone within reach;with bed alarm set  OT Visit Diagnosis: Unsteadiness on feet (R26.81);Other abnormalities of gait and mobility (R26.89)                Time: 9476-5465 OT Time Calculation (min): 14 min Charges:  OT General Charges $OT Visit: 1 Visit OT Evaluation $OT Eval Low Complexity: 1 Low  Alfonzo Beers, OTD, OTR/L Acute Rehab [phone]  Mayer Masker 05/19/2021, 1:40 PM

## 2021-05-23 ENCOUNTER — Other Ambulatory Visit: Payer: Self-pay

## 2021-05-23 ENCOUNTER — Ambulatory Visit (INDEPENDENT_AMBULATORY_CARE_PROVIDER_SITE_OTHER): Payer: BC Managed Care – PPO

## 2021-05-23 ENCOUNTER — Ambulatory Visit: Payer: BC Managed Care – PPO | Admitting: Podiatry

## 2021-05-23 DIAGNOSIS — L97512 Non-pressure chronic ulcer of other part of right foot with fat layer exposed: Secondary | ICD-10-CM | POA: Diagnosis not present

## 2021-05-23 DIAGNOSIS — E0843 Diabetes mellitus due to underlying condition with diabetic autonomic (poly)neuropathy: Secondary | ICD-10-CM | POA: Diagnosis not present

## 2021-05-23 DIAGNOSIS — Z9889 Other specified postprocedural states: Secondary | ICD-10-CM

## 2021-05-23 DIAGNOSIS — M869 Osteomyelitis, unspecified: Secondary | ICD-10-CM

## 2021-05-24 DIAGNOSIS — M79676 Pain in unspecified toe(s): Secondary | ICD-10-CM

## 2021-05-30 ENCOUNTER — Encounter: Payer: Self-pay | Admitting: Family Medicine

## 2021-05-30 ENCOUNTER — Ambulatory Visit: Payer: BC Managed Care – PPO | Admitting: Family Medicine

## 2021-05-30 ENCOUNTER — Other Ambulatory Visit: Payer: Self-pay

## 2021-05-30 ENCOUNTER — Ambulatory Visit: Payer: BC Managed Care – PPO | Admitting: Podiatry

## 2021-05-30 VITALS — BP 132/86 | HR 97 | Temp 98.0°F | Ht 70.0 in | Wt 169.2 lb

## 2021-05-30 DIAGNOSIS — E1165 Type 2 diabetes mellitus with hyperglycemia: Secondary | ICD-10-CM | POA: Diagnosis not present

## 2021-05-30 DIAGNOSIS — Z794 Long term (current) use of insulin: Secondary | ICD-10-CM | POA: Diagnosis not present

## 2021-05-30 DIAGNOSIS — Z9889 Other specified postprocedural states: Secondary | ICD-10-CM

## 2021-05-30 MED ORDER — DAPAGLIFLOZIN PROPANEDIOL 10 MG PO TABS: 10.0000 mg | ORAL_TABLET | Freq: Every day | ORAL | 2 refills | Status: DC

## 2021-05-30 NOTE — Progress Notes (Signed)
?  Chief Complaint  ?Patient presents with  ? Follow-up  ?  Diabetes ?Needs a referral to [place]  ? ? ?Subjective: ?Patient is a 56 y.o. male here for f/u.  He is here with his fianc?e. ? ?The patient was admitted to [facility] for osteomyelitis.  He required a left great toe amputation.  He was in the hospital from 2/20 - 2/25.  He has been checking his sugars more frequently since discharge.  His A1c was greater than 14.  It had gone from 16.1 to  9.9.  I had offered him a referral to endocrinology last year but he wanted to see if he can continue to get it down.  He is compliant with his metformin XR 1000 mg twice daily and insulin 16 units nightly.  He is not having hypoglycemia.  Diet is fair.  Not much exercise since his amputation.  He has a history of CKD established during his hospitalization.  He was prescribed Wilder Glade in the past but it was several hundred dollars per month. ? ?Past Medical History:  ?Diagnosis Date  ? Asthma   ? Diabetes mellitus without complication ([place])   ? History of chicken pox   ? History of DVT (deep vein thrombosis)   ? s/p trauma of R leg -- 2014. No other history of DVT  ? History of kidney stones   ? Hypertension   ? Lazy eye of left side   ? ? ?Objective: ?BP 132/86 (BP Location: Left Arm, Cuff Size: Normal)   Pulse 97   Temp 98 ?F (36.7 ?C) (Oral)   Ht 5\' 10"  (1.778 m)   Wt 169 lb 4 oz (76.8 kg)   SpO2 99%   BMI 24.28 kg/m?  ?General: Awake, appears stated age ?Heart: RRR, no LE edema ?Lungs: CTAB, no rales, wheezes or rhonchi. No accessory muscle use ?Psych: Age appropriate judgment and insight, normal affect and mood ? ?Assessment and Plan: ?Type 2 diabetes mellitus with hyperglycemia, with long-term current use of insulin ([place]) - Plan: Ambulatory referral to Endocrinology, dapagliflozin propanediol (FARXIGA) 10 MG TABS tablet ? ?Chronic, not controlled.  Refer to endocrinology.  Continue metformin XR 1000 mg twice daily, Tresiba 16 units nightly.  Monitor  blood sugars at various times of the day.  We will add Farxiga for both sugar effects, slight blood pressure effects, and kidney protection.  Follow-up with me as originally scheduled. ?The patient and his fianc?e voiced understanding and agreement to the plan. ? ?[name], DO ?05/30/21  ?11:14 AM ? ? ? ? ?

## 2021-05-30 NOTE — Patient Instructions (Addendum)
If you do not hear anything about your referral in the next 1-2 weeks, call our office and ask for an update. ? ?Keep the diet clean and stay active. ? ?Continue to check sugars at home. ? ?Do not fill medicine if it is too expensive.  ? ?Let us know if you need anything. ?

## 2021-05-30 NOTE — Progress Notes (Signed)
? ?  Subjective:  ?Patient presents today status post left great toe amputation. DOS: 05/16/2021.  Patient states that he is doing well.  His wife has been changing the dressings to his right foot.  No new complaints at this time ? ?Past Medical History:  ?Diagnosis Date  ? Asthma   ? Diabetes mellitus without complication (H[facility])   ? History of chicken pox   ? History of DVT (deep vein thrombosis)   ? s/p trauma of R leg -- 2014. No other history of DVT  ? History of kidney stones   ? Hypertension   ? Lazy eye of left side   ? ?  ? ?Objective/Physical Exam ?Absence of the great toe noted left.  Neurovascular status intact.  Skin incisions appear to be well coapted with sutures  intact.  no sign of infectious process noted. No dehiscence. No active bleeding noted.  Minimal edema noted. ? ?The ulcer to the right great toe has actually healed.  There is some hyperkeratotic callus tissue around the freshly healed wound but there is no open wound with no drainage and no erythema. ? ?Assessment: ?1. s/p left great toe amputation. DOS: 05/16/2021 with good routine healing ? ? ?Plan of Care:  ?1. Patient was evaluated.  ?2.  Dressings changed to the left foot.  Clean dry and intact for 1 additional week.  In 1 week we will plan to remove sutures ?3.  Continue daily diabetic foot lotion and moisturizer to the right foot. ?4.  Return to clinic in 1 week for suture removal ? ? ?Felecia Shelling, DPM ?[facility] ? ?Dr. Felecia Shelling, DPM  ?  ?[address]                                    ?[place], [place] [address]                ?Office [phone]  ?Fax [phone] ? ? ? ? ? ?

## 2021-06-04 NOTE — Progress Notes (Signed)
? ?  Subjective:  ?Patient presents today status post left great toe amputation. DOS: 05/16/2021 performed by Dr. Ardelle Anton.  Patient states that he is doing well.  He does not take any pain medications.  He is taking the antibiotics as directed.  Staples are intact. ?Patient also states that he would like to have his right great toe evaluated today.  He is concerned that the right great toe is developing a wound similar to his left great toe amputation ? ?Past Medical History:  ?Diagnosis Date  ? Asthma   ? Diabetes mellitus without complication (HCC)   ? History of chicken pox   ? History of DVT (deep vein thrombosis)   ? s/p trauma of R leg -- 2014. No other history of DVT  ? History of kidney stones   ? Hypertension   ? Lazy eye of left side   ? ?Past Surgical History:  ?Procedure Laterality Date  ? AMPUTATION TOE Left 05/16/2021  ? Procedure: AMPUTATION LEFT GREAT  TOE;  Surgeon: Vivi Barrack, DPM;  Location: [facility] OR;  Service: Podiatry;  Laterality: Left;  ? EYE SURGERY    ? ?Allergies  ?Allergen Reactions  ? Zofran [Ondansetron Hcl] Nausea Only  ?  Abdominal pain  ? ? ? ?Objective/Physical Exam ?Neurovascular status intact.  Skin incisions appear to be well coapted with staples intact. No sign of infectious process noted. No dehiscence. No active bleeding noted.  Minimal edema noted to the surgical extremity. ? ?Wound noted to the right great toe measuring approximately 1.0 x 1.0 x 0.2 cm.  To the noted ulceration there is no eschar.  There is minimal slough fibrin and necrotic tissue noted.  There is no exposed bone muscle tendon ligament or joint.  Wound base is red and granular.  Healthy bleeding noted upon debridement ? ?Radiographic Exam:  ?Absence of the great toe at the level of the MTP with routine healing ? ?Assessment: ?1. s/p left great toe amputation. DOS: 05/16/2021 ?2.  Ulcer right great toe fat layer exposed secondary to diabetes mellitus ? ? ?Plan of Care:  ?1. Patient was evaluated. X-rays  reviewed ?2.  Dressings changed. ?3.  Medically necessary excisional debridement including subcutaneous tissue was performed to the right hallux ulcer using a 312 scalpel.  Excisional debridement of all necrotic nonviable tissue down to healthier bleeding viable tissue was performed with postdebridement measurement same as pre-.  Light dressing applied ?4.  Silvadene cream provided to apply daily ?5.  Return to clinic in 1 week ? ? ?Felecia Shelling, DPM ?[facility] ? ?Dr. Felecia Shelling, DPM  ?  ?[address]                                    ?[place], [place] [address]                ?Office [phone]  ?Fax [phone] ? ? ? ? ? ?

## 2021-06-11 ENCOUNTER — Ambulatory Visit (INDEPENDENT_AMBULATORY_CARE_PROVIDER_SITE_OTHER): Payer: BC Managed Care – PPO | Admitting: Podiatry

## 2021-06-11 ENCOUNTER — Other Ambulatory Visit: Payer: Self-pay

## 2021-06-11 DIAGNOSIS — Z9889 Other specified postprocedural states: Secondary | ICD-10-CM

## 2021-06-11 MED ORDER — GENTAMICIN SULFATE 0.1 % EX CREA: 1.0000 "application " | TOPICAL_CREAM | Freq: Two times a day (BID) | CUTANEOUS | 1 refills | Status: DC

## 2021-06-11 NOTE — Progress Notes (Signed)
? ?  Subjective:  ?Patient presents today status post left great toe amputation. DOS: 05/16/2021.  Patient states that he is doing well.  His wife has been changing the dressings to his right foot.  No new complaints at this time ? ?Past Medical History:  ?Diagnosis Date  ? Asthma   ? Diabetes mellitus without complication (HCC)   ? History of chicken pox   ? History of DVT (deep vein thrombosis)   ? s/p trauma of R leg -- 2014. No other history of DVT  ? History of kidney stones   ? Hypertension   ? Lazy eye of left side   ? ?  ? ?Objective/Physical Exam ?Absence of the great toe noted left.  Neurovascular status intact.  Skin incisions appear to be well coapted with sutures  intact.  no sign of infectious process noted. No dehiscence. No active bleeding noted.  Negative for edema.  All ulcers to the toes have healed completely. ? ?Assessment: ?1. s/p left great toe amputation. DOS: 05/16/2021 with good routine healing ? ? ?Plan of Care:  ?1. Patient was evaluated.  ?2.  Remaining sutures removed today ?3.  Prescription for gentamicin cream sent to the pharmacy to apply along the incisional site daily ?4.  Patient may begin washing and showering and getting the foot wet.  Recommend daily foot moisturizer to the foot ?5.  Hyperkeratotic callus tissue was also debrided today without incident or bleeding.   ?6.  Return to clinic 2 weeks ? 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 ? ?Felecia Shelling, DPM ?[facility] ? ?Dr. Felecia Shelling, DPM  ?  ?[address]                                    ?[place], [place] [address]                ?Office [phone]  ?Fax [phone] ? ? ? ? ? ?

## 2021-06-25 ENCOUNTER — Ambulatory Visit (INDEPENDENT_AMBULATORY_CARE_PROVIDER_SITE_OTHER): Payer: BC Managed Care – PPO | Admitting: Podiatry

## 2021-06-25 ENCOUNTER — Encounter: Payer: Self-pay | Admitting: Podiatry

## 2021-06-25 DIAGNOSIS — Z9889 Other specified postprocedural states: Secondary | ICD-10-CM

## 2021-06-25 NOTE — Progress Notes (Signed)
? ?  Subjective:  ?Patient presents today status post left great toe amputation. DOS: 05/16/2021.  Patient states that he is doing well.  His wife has been changing the dressings to his right foot.  No new complaints at this time ? ?Past Medical History:  ?Diagnosis Date  ? Asthma   ? Diabetes mellitus without complication (HCC)   ? History of chicken pox   ? History of DVT (deep vein thrombosis)   ? s/p trauma of R leg -- 2014. No other history of DVT  ? History of kidney stones   ? Hypertension   ? Lazy eye of left side   ? ?  ? ?Objective/Physical Exam ?Absence of the great toe noted left.  Neurovascular status intact.  Skin incisions healed.  No infectious process noted clinically.  Negative for edema.  Hyperkeratotic preulcerative callus tissue also noted diffusely throughout the foot ? ?Assessment: ?1. s/p left great toe amputation. DOS: 05/16/2021 with good routine healing ? ? ?Plan of Care:  ?1. Patient was evaluated.  ?2.  Overall the patient is doing well.  All skin incisions are healed.  Calluses are greatly improved. ?3.  Appointment with Pedorthist for diabetic shoes and insoles ?4.  Patient may return to work full activity no restrictions ?5.  Excisional debridement of the hyperkeratotic callus tissue was performed using a 312 scalpel  ?6.  Return to clinic 4 weeks ? 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 ? ?Felecia Shelling, DPM ?[facility] ? ?Dr. Felecia Shelling, DPM  ?  ?[address]                                    ?[place], [place] [address]                ?Office [phone]  ?Fax [phone] ? ? ? ? ? ?

## 2021-07-11 ENCOUNTER — Other Ambulatory Visit: Payer: Self-pay | Admitting: Family Medicine

## 2021-07-11 DIAGNOSIS — E0843 Diabetes mellitus due to underlying condition with diabetic autonomic (poly)neuropathy: Secondary | ICD-10-CM

## 2021-07-30 ENCOUNTER — Ambulatory Visit: Payer: BC Managed Care – PPO | Admitting: Podiatry

## 2021-07-30 ENCOUNTER — Ambulatory Visit: Payer: BC Managed Care – PPO

## 2021-08-25 ENCOUNTER — Ambulatory Visit: Payer: Self-pay

## 2021-08-26 ENCOUNTER — Ambulatory Visit
Admission: RE | Admit: 2021-08-26 | Discharge: 2021-08-26 | Payer: 59 | Source: Ambulatory Visit | Attending: Internal Medicine | Admitting: Internal Medicine

## 2021-08-26 VITALS — BP 194/129 | HR 93 | Temp 97.9°F | Resp 18

## 2021-08-26 DIAGNOSIS — H6123 Impacted cerumen, bilateral: Secondary | ICD-10-CM

## 2021-08-26 DIAGNOSIS — I161 Hypertensive emergency: Secondary | ICD-10-CM

## 2021-08-26 MED ORDER — OFLOXACIN 0.3 % OT SOLN: 10.0000 [drp] | Freq: Every day | OTIC | 0 refills | Status: AC

## 2021-08-26 NOTE — Discharge Instructions (Signed)
Please go to the emergency department as soon as you leave urgent care for concerns for high blood pressure.  Your ears have been washed out and an antibiotic drop has been prescribed due to redness noted.  Please follow-up if symptoms persist or worsen with your ear complaints.

## 2021-08-26 NOTE — ED Notes (Signed)
Patient is being discharged from the Urgent Care and sent to the Emergency Department via personal vehicle . Per Provider [facility], patient is in need of higher level of care due to hypertensive emergency. Patient is aware and verbalizes understanding of plan of care.  Vitals:   08/26/21 1130 08/26/21 1131  BP: (!) 202/112 (!) 194/129  Pulse: 93   Resp: 18   Temp: 97.9 F (36.6 C)   SpO2: 98%

## 2021-08-26 NOTE — ED Provider Notes (Signed)
[facility]    CSN: [id number] Arrival date & time: 08/26/21  1059      History   Chief Complaint Chief Complaint  Patient presents with   Ear Fullness    Entered by patient    HPI John [name] is a 56 y.o. male.   Patient presents with feelings of bilateral illness that started yesterday.  He is concerned for cerumen impaction.  Denies any pain, trauma, foreign body, purulent drainage from the ears.  Denies any associated upper respiratory symptoms or fever.  Patient also has significantly elevated blood pressure but reports that he has not taken his blood pressure medication since yesterday.  Denies chest pain, shortness of breath, headache, blurred vision, dizziness, nausea, vomiting.  Patient reports that he took his blood pressure approximately 3 days ago and was 675 systolic.   Ear Fullness   Past Medical History:  Diagnosis Date   Asthma    Diabetes mellitus without complication ([place])    History of chicken pox    History of DVT (deep vein thrombosis)    s/p trauma of R leg -- 2014. No other history of DVT   History of kidney stones    Hypertension    Lazy eye of left side     Patient Active Problem List   Diagnosis Date Noted   Nausea & vomiting 05/17/2021   Positive D dimer 05/16/2021   COVID-19 virus infection 05/15/2021   Hyponatremia 05/15/2021   Normocytic anemia 05/15/2021   Osteomyelitis of great toe of left foot ([place]) 05/14/2021   CKD (chronic kidney disease) stage 3, GFR 30-59 ml/min ([place]) 05/14/2021   Diabetes mellitus due to underlying condition with diabetic autonomic neuropathy, unspecified whether long term insulin use ([place]) 07/24/2020   Hyperlipidemia LDL goal <100 12/06/2015   Hypertension 04/06/2012   Type 2 diabetes mellitus with hyperglycemia, with long-term current use of insulin ([place]) 03/20/2012   History of DVT (deep vein thrombosis) 03/19/2012    Past Surgical History:  Procedure Laterality Date   AMPUTATION TOE Left  05/16/2021   Procedure: AMPUTATION LEFT GREAT  TOE;  Surgeon: Trula Slade, DPM;  Location: [place];  Service: Podiatry;  Laterality: Left;   EYE SURGERY         Home Medications    Prior to Admission medications   Medication Sig Start Date End Date Taking? Authorizing Provider  ofloxacin (FLOXIN) 0.3 % OTIC solution Place 10 drops into both ears daily for 7 days. 08/26/21 09/02/21 Yes Macedonio Scallon, Michele Rockers, FNP  albuterol (VENTOLIN HFA) 108 (90 Base) MCG/ACT inhaler Inhale 2 puffs into the lungs every 6 (six) hours as needed for wheezing or shortness of breath. 10/21/19   Wendling, [name], DO  amLODipine (NORVASC) 5 MG tablet Take 1 tablet (5 mg total) by mouth daily. 05/19/21   Regalado, Jerald Kief A, MD  aspirin 81 MG chewable tablet Chew 81 mg by mouth daily.    [provider]  atorvastatin (LIPITOR) 20 MG tablet Take 1 tablet (20 mg total) by mouth daily. 03/14/21   [name], DO  blood glucose meter kit and supplies KIT Dispense based on patient and insurance preference. Use up to four times daily as directed. (FOR ICD-9 250.00, 250.01). 05/19/21   Regalado, Belkys A, MD  carvedilol (COREG) 25 MG tablet Take 1 tablet (25 mg total) by mouth 2 (two) times daily with a meal. 07/18/20   Wendling, [name], DO  dapagliflozin propanediol (FARXIGA) 10 MG TABS tablet Take 1  tablet (10 mg total) by mouth daily before breakfast. 05/30/21   Wendling, [name], DO  famotidine (PEPCID) 20 MG tablet Take 1 tablet (20 mg total) by mouth 2 (two) times daily. 05/19/21   Regalado, Belkys A, MD  gentamicin cream (GARAMYCIN) 0.1 % Apply 1 application. topically 2 (two) times daily. 06/11/21   [name], DPM  glucose blood test strip Use as instructed, Check blood sugars twice daily 05/19/21   Regalado, Belkys A, MD  Insulin Pen Needle (NOVOFINE PLUS PEN NEEDLE) 32G X 4 MM MISC Use daily to insulin 05/19/21   Regalado, Belkys A, MD  Lancets (ONETOUCH ULTRASOFT) lancets Use as  instructed, Check blood sugars twice daily 05/19/21   Regalado, Belkys A, MD  levocetirizine (XYZAL) 5 MG tablet Take 1 tablet (5 mg total) by mouth every evening. Patient taking differently: Take 5 mg by mouth daily as needed for allergies. 12/01/19   [name], DO  lisinopril (ZESTRIL) 20 MG tablet Take 1 tablet (20 mg total) by mouth daily. 07/18/20   [name], DO  metFORMIN (GLUCOPHAGE-XR) 500 MG 24 hr tablet Take 2 tablets (1,000 mg total) by mouth in the morning and at bedtime. 05/19/21   Regalado, Belkys A, MD  prochlorperazine (COMPAZINE) 5 MG tablet Take 1 tablet (5 mg total) by mouth every 8 (eight) hours as needed for nausea or vomiting. 05/19/21   Regalado, Belkys A, MD  senna-docusate (SENOKOT-S) 8.6-50 MG tablet Take 1 tablet by mouth 2 (two) times daily. 05/19/21   Regalado, Cassie Freer, MD  TRESIBA FLEXTOUCH 100 UNIT/ML FlexTouch Pen INJECT 12 UNITS INTO THE SKIN AT BEDTIME. 07/11/21   Wendling, [name], DO    Family History Family History  Problem Relation Age of Onset   Heart disease Mother 50   Arthritis Mother    Heart disease Father 32   Stroke Father    Healthy Brother    Heart disease Maternal Grandmother    Diabetes Maternal Uncle     Social History Social History   Tobacco Use   Smoking status: Never   Smokeless tobacco: Never  Substance Use Topics   Alcohol use: No   Drug use: No     Allergies   Zofran [ondansetron hcl]   Review of Systems Review of Systems Per HPI  Physical Exam Triage Vital Signs ED Triage Vitals  Enc Vitals Group     BP 08/26/21 1130 (!) 202/112     Pulse Rate 08/26/21 1130 93     Resp 08/26/21 1130 18     Temp 08/26/21 1130 97.9 F (36.6 C)     Temp Source 08/26/21 1130 Oral     SpO2 08/26/21 1130 98 %     Weight --      Height --      Head Circumference --      Peak Flow --      Pain Score 08/26/21 1131 0     Pain Loc --      Pain Edu? --      Excl. in [place]? --    No data  found.  Updated Vital Signs BP (!) 194/129 (BP Location: Right Arm)   Pulse 93   Temp 97.9 F (36.6 C) (Oral)   Resp 18   SpO2 98%   Visual Acuity Right Eye Distance:   Left Eye Distance:   Bilateral Distance:    Right Eye Near:   Left Eye Near:    Bilateral Near:  Physical Exam Constitutional:      General: He is not in acute distress.    Appearance: Normal appearance. He is not toxic-appearing or diaphoretic.  HENT:     Head: Normocephalic and atraumatic.     Right Ear: External ear normal. No drainage, swelling or tenderness. No middle ear effusion. There is impacted cerumen. Tympanic membrane is not perforated, erythematous or bulging.     Left Ear: External ear normal. No drainage, swelling or tenderness.  No middle ear effusion. There is impacted cerumen. Tympanic membrane is not perforated, erythematous or bulging.     Ears:     Comments: Bilateral impacted cerumen on original exam.  On second physical exam after bilateral ear irrigation, cerumen was removed but patient had mild erythema located to external canals. Eyes:     Extraocular Movements: Extraocular movements intact.     Conjunctiva/sclera: Conjunctivae normal.     Pupils: Pupils are equal, round, and reactive to light.  Cardiovascular:     Rate and Rhythm: Normal rate and regular rhythm.     Pulses: Normal pulses.     Heart sounds: Normal heart sounds.  Pulmonary:     Effort: Pulmonary effort is normal. No respiratory distress.  Neurological:     General: No focal deficit present.     Mental Status: He is alert and oriented to person, place, and time. Mental status is at baseline.     Cranial Nerves: Cranial nerves 2-12 are intact.     Sensory: Sensation is intact.     Motor: Motor function is intact.     Coordination: Coordination is intact.     Gait: Gait is intact.  Psychiatric:        Mood and Affect: Mood normal.        Behavior: Behavior normal.        Thought Content: Thought content  normal.        Judgment: Judgment normal.     UC Treatments / Results  Labs (all labs ordered are listed, but only abnormal results are displayed) Labs Reviewed - No data to display  EKG   Radiology No results found.  Procedures Procedures (including critical care time)  Medications Ordered in UC Medications - No data to display  Initial Impression / Assessment and Plan / UC Course  I have reviewed the triage vital signs and the nursing notes.  Pertinent labs & imaging results that were available during my care of the patient were reviewed by me and considered in my medical decision making (see chart for details).     Patient has significantly elevated blood pressure reading which is very concerning.  Retake was similar.  Patient was advised that he will need to go to the hospital for further evaluation and management as it may be beneficial to get blood pressure under control as soon as possible.  Patient was agreeable with plan.  He left via self transport.  No obvious associated symptoms at this time.  Patient also had bilateral ear cerumen impaction.  Both ears were irrigated.  Successful removal of cerumen was complete but patient did have mild erythema located to external canals.  Will treat with ofloxacin for this.  Patient was given strict return precautions for ear symptoms. Final Clinical Impressions(s) / UC Diagnoses   Final diagnoses:  Bilateral impacted cerumen  Hypertensive emergency     Discharge Instructions      Please go to the emergency department as soon as you leave urgent care for concerns  for high blood pressure.  Your ears have been washed out and an antibiotic drop has been prescribed due to redness noted.  Please follow-up if symptoms persist or worsen with your ear complaints.    ED Prescriptions     Medication Sig Dispense Auth. Provider   ofloxacin (FLOXIN) 0.3 % OTIC solution Place 10 drops into both ears daily for 7 days. 5 mL [name], [place]      PDMP not reviewed this encounter.   [name], [place] 08/26/21 1232

## 2021-08-26 NOTE — ED Triage Notes (Signed)
Pt presents with right ear fullness since yesterday. 

## 2021-08-29 ENCOUNTER — Ambulatory Visit (INDEPENDENT_AMBULATORY_CARE_PROVIDER_SITE_OTHER): Payer: 59

## 2021-08-29 ENCOUNTER — Ambulatory Visit: Payer: 59 | Admitting: Podiatry

## 2021-08-29 DIAGNOSIS — Z9889 Other specified postprocedural states: Secondary | ICD-10-CM

## 2021-08-29 DIAGNOSIS — M2011 Hallux valgus (acquired), right foot: Secondary | ICD-10-CM

## 2021-08-29 DIAGNOSIS — L97512 Non-pressure chronic ulcer of other part of right foot with fat layer exposed: Secondary | ICD-10-CM

## 2021-08-29 DIAGNOSIS — M2012 Hallux valgus (acquired), left foot: Secondary | ICD-10-CM | POA: Diagnosis not present

## 2021-08-29 DIAGNOSIS — M2042 Other hammer toe(s) (acquired), left foot: Secondary | ICD-10-CM

## 2021-08-29 DIAGNOSIS — E0843 Diabetes mellitus due to underlying condition with diabetic autonomic (poly)neuropathy: Secondary | ICD-10-CM

## 2021-08-29 NOTE — Progress Notes (Signed)
SITUATION Reason for Consult: Evaluation for Bilateral Custom Foot Orthoses Patient / Caregiver Report: Patient is ready for foot orthotics  OBJECTIVE DATA: Patient History / Diagnosis:    ICD-10-CM   1. Hallux valgus, left  M20.12     2. Hammertoe of second toe of left foot  M20.42     3. Diabetes mellitus due to underlying condition with diabetic autonomic neuropathy, unspecified whether long term insulin use (HCC)  E08.43     4. 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  ACTIONS PERFORMED Potential out of pocket cost was communicated to patient. Patient understood and consent to casting. Patient was casted for Foot Orthoses via crush box. Procedure was explained and patient tolerated procedure well. Casts were shipped to central fabrication. All questions were answered and concerns addressed.  PLAN Patient is to be called for fitting when devices are ready.

## 2021-09-10 NOTE — Progress Notes (Signed)
   Subjective:  Patient presents today status post left great toe amputation. DOS: 05/16/2021.  Patient doing well.  No new complaints at this time.  Past Medical History:  Diagnosis Date   Asthma    Diabetes mellitus without complication (HCC)    History of chicken pox    History of DVT (deep vein thrombosis)    s/p trauma of R leg -- 2014. No other history of DVT   History of kidney stones    Hypertension    Lazy eye of left side    Past Surgical History:  Procedure Laterality Date   AMPUTATION TOE Left 05/16/2021   Procedure: AMPUTATION LEFT GREAT  TOE;  Surgeon: Vivi Barrack, DPM;  Location[facility];  Service: Podiatry;  Laterality: Left;   EYE SURGERY     Allergies  Allergen Reactions   Zofran [Ondansetron Hcl] Nausea Only    Abdominal pain     Objective/Physical Exam Absence of the great toe noted left.  Neurovascular status intact.  Skin incisions healed.  No infectious process noted clinically.  Negative for edema.  Well-healed surgical amputation site  Assessment: 1. s/p left great toe amputation. DOS: 05/16/2021 with good routine healing   Plan of Care:  1. Patient was evaluated.  2.  Overall the patient is doing well.  Amputation site healed.   3.  Patient has an appointment with the Pedorthist for diabetic shoes and insoles today after this visit 4.  Resume full activity no restrictions 5.  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 Felecia Shelling, DPM    [address] [place], [place] [address]                Office [phone]  Fax [phone]

## 2021-09-21 LAB — HM DIABETES EYE EXAM

## 2021-09-24 ENCOUNTER — Encounter: Payer: Self-pay | Admitting: Family Medicine

## 2021-10-10 ENCOUNTER — Other Ambulatory Visit: Payer: 59

## 2021-10-15 ENCOUNTER — Other Ambulatory Visit: Payer: 59

## 2021-10-17 ENCOUNTER — Ambulatory Visit (INDEPENDENT_AMBULATORY_CARE_PROVIDER_SITE_OTHER): Payer: 59

## 2021-10-17 ENCOUNTER — Ambulatory Visit: Payer: 59 | Admitting: Podiatry

## 2021-10-17 ENCOUNTER — Telehealth: Payer: Self-pay | Admitting: *Deleted

## 2021-10-17 DIAGNOSIS — L97512 Non-pressure chronic ulcer of other part of right foot with fat layer exposed: Secondary | ICD-10-CM | POA: Diagnosis not present

## 2021-10-17 DIAGNOSIS — E0843 Diabetes mellitus due to underlying condition with diabetic autonomic (poly)neuropathy: Secondary | ICD-10-CM

## 2021-10-17 DIAGNOSIS — R52 Pain, unspecified: Secondary | ICD-10-CM

## 2021-10-17 NOTE — Telephone Encounter (Signed)
Patient is calling for status of an antibiotic that was supposed to be sent to pharmacy,not there. Please advise. 

## 2021-10-18 MED ORDER — SULFAMETHOXAZOLE-TRIMETHOPRIM 800-160 MG PO TABS: 1.0000 | ORAL_TABLET | Freq: Two times a day (BID) | ORAL | 0 refills | Status: DC

## 2021-10-18 NOTE — Telephone Encounter (Signed)
Prescription sent to [facility] on [address].  Please notify patient.  Thanks, Dr. Logan Bores

## 2021-10-19 ENCOUNTER — Encounter: Payer: Self-pay | Admitting: Family Medicine

## 2021-10-19 ENCOUNTER — Ambulatory Visit: Payer: 59 | Admitting: Family Medicine

## 2021-10-19 VITALS — BP 128/70 | HR 107 | Temp 98.0°F | Ht 70.5 in | Wt 176.0 lb

## 2021-10-19 DIAGNOSIS — Z794 Long term (current) use of insulin: Secondary | ICD-10-CM | POA: Diagnosis not present

## 2021-10-19 DIAGNOSIS — E0843 Diabetes mellitus due to underlying condition with diabetic autonomic (poly)neuropathy: Secondary | ICD-10-CM

## 2021-10-19 DIAGNOSIS — E1165 Type 2 diabetes mellitus with hyperglycemia: Secondary | ICD-10-CM

## 2021-10-19 DIAGNOSIS — E119 Type 2 diabetes mellitus without complications: Secondary | ICD-10-CM

## 2021-10-19 DIAGNOSIS — I10 Essential (primary) hypertension: Secondary | ICD-10-CM

## 2021-10-19 MED ORDER — AMLODIPINE BESYLATE 5 MG PO TABS: 5.0000 mg | ORAL_TABLET | Freq: Every day | ORAL | 2 refills | Status: DC

## 2021-10-19 MED ORDER — DAPAGLIFLOZIN PROPANEDIOL 10 MG PO TABS: 10.0000 mg | ORAL_TABLET | Freq: Every day | ORAL | 2 refills | Status: DC

## 2021-10-19 MED ORDER — TRESIBA FLEXTOUCH 100 UNIT/ML ~~LOC~~ SOPN: PEN_INJECTOR | SUBCUTANEOUS | 3 refills | Status: DC

## 2021-10-19 MED ORDER — LISINOPRIL 20 MG PO TABS: 20.0000 mg | ORAL_TABLET | Freq: Every day | ORAL | 2 refills | Status: DC

## 2021-10-19 MED ORDER — INSULIN GLARGINE-YFGN 100 UNIT/ML ~~LOC~~ SOPN: 12.0000 [IU] | PEN_INJECTOR | Freq: Every day | SUBCUTANEOUS | 2 refills | Status: DC

## 2021-10-19 MED ORDER — ATORVASTATIN CALCIUM 20 MG PO TABS: 20.0000 mg | ORAL_TABLET | Freq: Every day | ORAL | 3 refills | Status: DC

## 2021-10-19 MED ORDER — CARVEDILOL 25 MG PO TABS: 25.0000 mg | ORAL_TABLET | Freq: Two times a day (BID) | ORAL | 2 refills | Status: DC

## 2021-10-19 MED ORDER — METFORMIN HCL ER 500 MG PO TB24: 1000.0000 mg | ORAL_TABLET | Freq: Two times a day (BID) | ORAL | 2 refills | Status: DC

## 2021-10-19 NOTE — Progress Notes (Signed)
Chief Complaint  Patient presents with   Callouses    right great toe callus causing discomfort/ puo    Subjective:  56 y.o. male with PMHx of diabetes mellitus, PSxHx left great toe amputation 05/16/2021 presenting today for evaluation of a wound/callus to the plantar aspect of the right great toe.  Patient states that he washed it and scrubbed it hard and it bled.  This has been ongoing for about 1 week so the patient says.  He presents for further treatment and evaluation   Past Medical History:  Diagnosis Date   Asthma    Diabetes mellitus without complication (HCC)    History of chicken pox    History of DVT (deep vein thrombosis)    s/p trauma of R leg -- 2014. No other history of DVT   History of kidney stones    Hypertension    Lazy eye of left side     Past Surgical History:  Procedure Laterality Date   AMPUTATION TOE Left 05/16/2021   Procedure: AMPUTATION LEFT GREAT  TOE;  Surgeon: Vivi Barrack, DPM;  Location: [facility];  Service: Podiatry;  Laterality: Left;   EYE SURGERY      Allergies  Allergen Reactions   Zofran [Ondansetron Hcl] Nausea Only    Abdominal pain       Objective/Physical Exam General: The patient is alert and oriented x3 in no acute distress.  Dermatology:  Wound #1 noted to the plantar aspect of the right great toe measuring approximately 3.0 x 2.5 x 0.3 cm (LxWxD).   To the noted ulceration(s), there is no eschar.  Originally there was a heavy amount of slough fibrin and necrotic tissue noted.  After debridement there was good healthy granular tissue along the wound base.  Currently no exposed bone muscle tendon ligament or joint.  Extensive amount of serosanguineous drainage.  Healthy bleeding upon debridement.  Periwound is macerated.  There is a slight malodor also coming from the wound  Vascular: Palpable pedal pulses bilaterally. No edema or erythema noted. Capillary refill within normal limits.  Neurological: Epicritic and  protective threshold diminished bilaterally.   Musculoskeletal Exam: History of prior amputation left great toe 05/16/2021  Assessment: 1.  Ulcer right great toe secondary to diabetes mellitus 2. diabetes mellitus w/ peripheral neuropathy 3. PSxHx amputation LT great toe 05/16/2021  Plan of Care:  1. Patient was evaluated. 2. medically necessary excisional debridement including subcutaneous tissue was performed using a tissue nipper and a chisel blade. Excisional debridement of all the necrotic nonviable tissue down to healthy bleeding viable tissue was performed with post-debridement measurements same as pre-. 3. the wound was cleansed and dry sterile dressing applied. 4.  Cultures taken and sent to pathology for culture and sensitivity  5.  Prescription for Bactrim DS  6.  Cam boot dispensed.  Weightbearing as tolerated  7.  Betadine ointment provided for the patient to apply daily with a dressing  8.  Patient is to return to clinic in 2 weeks.  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 Felecia Shelling, DPM    [address] [place] [place], [place] [address]  Office [phone]  Fax [phone]

## 2021-10-19 NOTE — Progress Notes (Signed)
Subjective:   Chief Complaint  Patient presents with   Follow-up    Medications Referral endo    John [name] is a 56 y.o. male here for follow-up of diabetes.   Jakwan's self monitored glucose range is 110's.  Patient denies hypoglycemic reactions. He checks his glucose levels 1 time(s) per day. Patient does require insulin.  12 u nightly Tresiba. Medications include: Farxiga 10 mg/d, Metformin XR 1000 mg bid Diet is could be better.  Exercise: walking  Hypertension Patient presents for hypertension follow up. He does monitor home blood pressures. Blood pressures ranging on average from 130's/70's. He is compliant with medications- Norvasc 5 mg/d, Coreg 25 mg bid, lisinopril 20 mg/d. Patient has these side effects of medication: none Diet/exercise as above. No CP or SOB.   Past Medical History:  Diagnosis Date   Asthma    Diabetes mellitus without complication (HCC)    History of chicken pox    History of DVT (deep vein thrombosis)    s/p trauma of R leg -- 2014. No other history of DVT   History of kidney stones    Hypertension    Lazy eye of left side      Related testing: Retinal exam: Done Pneumovax: done  Objective:  BP 128/70   Pulse (!) 107   Temp 98 F (36.7 C) (Oral)   Ht 5' 10.5" (1.791 m)   Wt 176 lb (79.8 kg)   SpO2 98%   BMI 24.90 kg/m  General:  Well developed, well nourished, in no apparent distress Skin:  Warm, no pallor or diaphoresis on exposed skin surfaces Lungs:  CTAB, no access msc use Cardio:  RRR, no bruits, 2+ pitting bilateral LE edema tapering at the proximal third of the tibia Psych: Age appropriate judgment and insight  Assessment:   Type 2 diabetes mellitus with hyperglycemia, with long-term current use of insulin (HCC) - Plan: Ambulatory referral to Endocrinology, dapagliflozin propanediol (FARXIGA) 10 MG TABS tablet  Type 2 diabetes mellitus without complication, without long-term current use of insulin (HCC) - Plan:  metFORMIN (GLUCOPHAGE-XR) 500 MG 24 hr tablet, atorvastatin (LIPITOR) 20 MG tablet  Diabetes mellitus due to underlying condition with diabetic autonomic neuropathy, unspecified whether long term insulin use (HCC) - Plan: DISCONTINUED: insulin degludec (TRESIBA FLEXTOUCH) 100 UNIT/ML FlexTouch Pen  Essential hypertension - Plan: amLODipine (NORVASC) 5 MG tablet, carvedilol (COREG) 25 MG tablet, lisinopril (ZESTRIL) 20 MG tablet   Plan:   Chronic, unsure if stable.  We will stop [place] and replace it with Semgle[place] 12 units nightly.  Samples given in addition to payment card for both [employer] and [employer].  He may have to contact his insurance to find out what is covered.  Continue Farxiga 10 mg daily, metformin XR 1000 mg twice daily.  Counseled on diet and exercise. Chronic, stable.  Continue Norvasc 5 mg daily, lisinopril 20 mg daily, Coreg 25 mg twice daily. F/u in 3-6 mo pending above. The patient voiced understanding and agreement to the plan.  Jilda Roche Bennett, DO 10/19/21 3:04 PM

## 2021-10-19 NOTE — Telephone Encounter (Signed)
Called patient , no answer, could not leave message, mailbox full.

## 2021-10-19 NOTE — Patient Instructions (Addendum)
If you do not hear anything about your referral in the next 1-2 weeks, call our office and ask for an update.  Keep the diet clean and stay active.  Give [place] 2-3 business days to get the results of your labs back.   Let us know if you need anything.

## 2021-10-20 LAB — COMPREHENSIVE METABOLIC PANEL
AG Ratio: 1.1 (calc) (ref 1.0–2.5)
ALT: 20 U/L (ref 9–46)
AST: 18 U/L (ref 10–35)
Albumin: 4 g/dL (ref 3.6–5.1)
Alkaline phosphatase (APISO): 63 U/L (ref 35–144)
BUN/Creatinine Ratio: 13 (calc) (ref 6–22)
BUN: 21 mg/dL (ref 7–25)
CO2: 21 mmol/L (ref 20–32)
Calcium: 9 mg/dL (ref 8.6–10.3)
Chloride: 109 mmol/L (ref 98–110)
Creat: 1.62 mg/dL — ABNORMAL HIGH (ref 0.70–1.30)
Globulin: 3.5 g/dL (calc) (ref 1.9–3.7)
Glucose, Bld: 158 mg/dL — ABNORMAL HIGH (ref 65–99)
Potassium: 4.1 mmol/L (ref 3.5–5.3)
Sodium: 141 mmol/L (ref 135–146)
Total Bilirubin: 0.6 mg/dL (ref 0.2–1.2)
Total Protein: 7.5 g/dL (ref 6.1–8.1)

## 2021-10-20 LAB — LIPID PANEL
Cholesterol: 190 mg/dL (ref <200)
HDL: 48 mg/dL (ref >=40)
LDL Cholesterol (Calc): 119 mg/dL (calc) — ABNORMAL HIGH
Non-HDL Cholesterol (Calc): 142 mg/dL (calc) — ABNORMAL HIGH (ref <130)
Total CHOL/HDL Ratio: 4 (calc) (ref <5.0)
Triglycerides: 124 mg/dL (ref <150)

## 2021-10-20 LAB — WOUND CULTURE
MICRO NUMBER:: 13697343
SPECIMEN QUALITY:: ADEQUATE

## 2021-10-20 LAB — HEMOGLOBIN A1C
Hgb A1c MFr Bld: 9.5 % of total Hgb — ABNORMAL HIGH (ref <5.7)
Mean Plasma Glucose: 226 mg/dL
eAG (mmol/L): 12.5 mmol/L

## 2021-10-22 ENCOUNTER — Other Ambulatory Visit: Payer: Self-pay | Admitting: Family Medicine

## 2021-10-22 ENCOUNTER — Other Ambulatory Visit: Payer: Self-pay | Admitting: Podiatry

## 2021-10-22 DIAGNOSIS — N1831 Chronic kidney disease, stage 3a: Secondary | ICD-10-CM

## 2021-10-22 MED ORDER — CIPROFLOXACIN HCL 500 MG PO TABS: 500.0000 mg | ORAL_TABLET | Freq: Two times a day (BID) | ORAL | 0 refills | Status: DC

## 2021-10-22 NOTE — Progress Notes (Signed)
Rx for ciprofloxacin 500 mg twice daily #20 based on culture results.  Discontinue Bactrim DS.  Tried to call the patient.  No answer.  Went to [employer].  Voicemail full.  Felecia Shelling, DPM [facility]  Dr. Felecia Shelling, DPM    [address] [place], [place] [address]                Office [phone]  Fax [phone]

## 2021-10-25 ENCOUNTER — Other Ambulatory Visit (INDEPENDENT_AMBULATORY_CARE_PROVIDER_SITE_OTHER): Payer: 59

## 2021-10-25 DIAGNOSIS — N1831 Chronic kidney disease, stage 3a: Secondary | ICD-10-CM

## 2021-10-25 LAB — BASIC METABOLIC PANEL
BUN: 24 mg/dL — ABNORMAL HIGH (ref 6–23)
CO2: 24 mEq/L (ref 19–32)
Calcium: 8.9 mg/dL (ref 8.4–10.5)
Chloride: 102 mEq/L (ref 96–112)
Creatinine, Ser: 1.49 mg/dL (ref 0.40–1.50)
GFR: 52.49 mL/min — ABNORMAL LOW (ref >60.00)
Glucose, Bld: 195 mg/dL — ABNORMAL HIGH (ref 70–99)
Potassium: 4.6 mEq/L (ref 3.5–5.1)
Sodium: 133 mEq/L — ABNORMAL LOW (ref 135–145)

## 2021-10-30 ENCOUNTER — Encounter: Payer: 59 | Admitting: Family Medicine

## 2021-10-30 ENCOUNTER — Ambulatory Visit: Payer: 59 | Admitting: Family Medicine

## 2021-10-31 ENCOUNTER — Ambulatory Visit: Payer: 59 | Admitting: Podiatry

## 2021-10-31 DIAGNOSIS — L97512 Non-pressure chronic ulcer of other part of right foot with fat layer exposed: Secondary | ICD-10-CM

## 2021-10-31 MED ORDER — CIPROFLOXACIN HCL 500 MG PO TABS: 500.0000 mg | ORAL_TABLET | Freq: Two times a day (BID) | ORAL | 0 refills | Status: AC

## 2021-10-31 NOTE — Progress Notes (Signed)
Chief Complaint  Patient presents with   Callouses    right great toe callus     Subjective:  56 y.o. male with PMHx of diabetes mellitus, PSxHx left great toe amputation 05/16/2021 presenting today for follow-up evaluation of an ulcer to the plantar aspect of the right great toe.  Patient states that he has been taking the ciprofloxacin that was prescribed.  He has also been applying Betadine with Silvadene cream.  He has been wearing the cam boot is much as possible.  He does admit to not wearing the cam boot throughout the entire day.  Overall he believes there is significant improvement of the wound.  He presents today with his wife   Past Medical History:  Diagnosis Date   Asthma    Diabetes mellitus without complication (HCC)    History of chicken pox    History of DVT (deep vein thrombosis)    s/p trauma of R leg -- 2014. No other history of DVT   History of kidney stones    Hypertension    Lazy eye of left side     Past Surgical History:  Procedure Laterality Date   AMPUTATION TOE Left 05/16/2021   Procedure: AMPUTATION LEFT GREAT  TOE;  Surgeon: Vivi Barrack, DPM;  Location: [facility];  Service: Podiatry;  Laterality: Left;   EYE SURGERY      Allergies  Allergen Reactions   Zofran [Ondansetron Hcl] Nausea Only    Abdominal pain      Objective/Physical Exam General: The patient is alert and oriented x3 in no acute distress.  Dermatology:  Wound #1 noted to the plantar aspect of the right great toe measuring approximately 2.0 x 1.5 x 0.2 cm (LxWxD).   To the noted ulceration(s), there is no eschar.  Significant improvement overall of the wound.  Granular wound base.  Periwound intact.  No exposed bone muscle tendon ligament joint.  Vascular: Palpable pedal pulses bilaterally. No edema or erythema noted. Capillary refill within normal limits.  Neurological: Epicritic and protective threshold diminished bilaterally.   Musculoskeletal Exam: History of prior  amputation left great toe 05/16/2021  Radiographic exam RT foot 10/17/2021: Normal osseous mineralization.  No obvious sign of osseous irregularity or erosions around the great toe.  There does appear to be an accessory IPJ bone visualized on AP and medial oblique views.    Assessment: 1.  Ulcer right great toe secondary to diabetes mellitus 2. diabetes mellitus w/ peripheral neuropathy 3. PSxHx amputation LT great toe 05/16/2021  Plan of Care:  1. Patient was evaluated.  Cultures reviewed 2. medically necessary excisional debridement including subcutaneous tissue was performed using a tissue nipper and a chisel blade. Excisional debridement of all the necrotic nonviable tissue down to healthy bleeding viable tissue was performed with post-debridement measurements same as pre-. 3. the wound was cleansed and dry sterile dressing applied. 4.  Continue Cipro 500 mg twice daily 5.  Continue weightbearing in the cam boot. 6.  Silvadene cream provided for the patient to apply to the wound daily with a light dressing  7.  Patient has been molded for diabetic shoes with custom molded Plastizote inserts.  Pending dispensable  8.  return to clinic 2 weeks.  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 Felecia Shelling, DPM    [address]  [place], [place] [address]                Office [phone]  Fax [phone]

## 2021-11-14 ENCOUNTER — Ambulatory Visit: Payer: 59 | Admitting: Podiatry

## 2021-11-14 DIAGNOSIS — M79675 Pain in left toe(s): Secondary | ICD-10-CM | POA: Diagnosis not present

## 2021-11-14 DIAGNOSIS — B351 Tinea unguium: Secondary | ICD-10-CM | POA: Diagnosis not present

## 2021-11-14 DIAGNOSIS — M79674 Pain in right toe(s): Secondary | ICD-10-CM

## 2021-11-14 DIAGNOSIS — L97512 Non-pressure chronic ulcer of other part of right foot with fat layer exposed: Secondary | ICD-10-CM

## 2021-11-14 NOTE — Progress Notes (Signed)
Chief Complaint  Patient presents with   Callouses    Patient is here for right great toe callous.    Subjective:  56 y.o. male with PMHx of diabetes mellitus, PSxHx left great toe amputation 05/16/2021 presenting today for follow-up evaluation of an ulcer to the plantar aspect of the right great toe.  Patient states that they have been applying the Silvadene cream and a dressing daily.  He continues to wear the cam boot is much as possible.  Patient is also requesting nail trim today.  His nails are thick and discolored and he is unable to wear shoes without pain.  Patient also unable to trim his own nails and is high risk due to being diabetic.  Past Medical History:  Diagnosis Date   Asthma    Diabetes mellitus without complication (HCC)    History of chicken pox    History of DVT (deep vein thrombosis)    s/p trauma of R leg -- 2014. No other history of DVT   History of kidney stones    Hypertension    Lazy eye of left side     Past Surgical History:  Procedure Laterality Date   AMPUTATION TOE Left 05/16/2021   Procedure: AMPUTATION LEFT GREAT  TOE;  Surgeon: Vivi Barrack, DPM;  Location: [facility];  Service: Podiatry;  Laterality: Left;   EYE SURGERY      Allergies  Allergen Reactions   Zofran [Ondansetron Hcl] Nausea Only    Abdominal pain      Objective/Physical Exam General: The patient is alert and oriented x3 in no acute distress.  Dermatology:  Sig improvement of the wound noted.  Wound #1 noted to the plantar aspect of the right great toe measuring approximately 0.5x0.5 x 0.2 cm (LxWxD).   To the noted ulceration(s), there is no eschar.  Significant improvement overall of the wound.  Granular wound base.  Periwound intact.  No exposed bone muscle tendon ligament joint.  Hyperkeratotic elongated nails also noted 1-5 bilateral.  Vascular: Palpable pedal pulses bilaterally. No edema or erythema noted. Capillary refill within normal limits.  Neurological:  Epicritic and protective threshold diminished bilaterally.   Musculoskeletal Exam: History of prior amputation left great toe 05/16/2021  Radiographic exam RT foot 10/17/2021: Normal osseous mineralization.  No obvious sign of osseous irregularity or erosions around the great toe.  There does appear to be an accessory IPJ bone visualized on AP and medial oblique views.    Assessment: 1.  Ulcer right great toe secondary to diabetes mellitus 2. diabetes mellitus w/ peripheral neuropathy 3. PSxHx amputation LT great toe 05/16/2021 4.  Pain due to onychomycosis of toenails both  Plan of Care:  1. Patient was evaluated.  Cultures reviewed 2. medically necessary excisional debridement including subcutaneous tissue was performed using a tissue nipper and a chisel blade. Excisional debridement of all the necrotic nonviable tissue down to healthy bleeding viable tissue was performed with post-debridement measurements same as pre-. 3. the wound was cleansed and dry sterile dressing applied. 4.  Patient completed ciprofloxacin 500 mg as prescribed. 5.  Continue weightbearing in the cam boot as much as possible. 6.  Continue Silvadene cream with dry sterile dressing 7.  Patient has been molded for diabetic shoes with custom molded Plastizote inserts.  Pending dispensable  8.  Mechanical debridement of nails 1-5 bilateral was performed using a nail nipper without incident or bleeding 9.  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 Felecia Shelling, DPM    [address] [place], [place] [address]                Office [phone]  Fax [phone]

## 2021-12-24 ENCOUNTER — Encounter: Payer: Self-pay | Admitting: Podiatry

## 2021-12-24 ENCOUNTER — Other Ambulatory Visit: Payer: Self-pay | Admitting: Podiatry

## 2021-12-24 ENCOUNTER — Ambulatory Visit: Payer: 59 | Admitting: Podiatry

## 2021-12-24 DIAGNOSIS — E0843 Diabetes mellitus due to underlying condition with diabetic autonomic (poly)neuropathy: Secondary | ICD-10-CM

## 2021-12-24 DIAGNOSIS — L97512 Non-pressure chronic ulcer of other part of right foot with fat layer exposed: Secondary | ICD-10-CM | POA: Diagnosis not present

## 2021-12-24 NOTE — Progress Notes (Signed)
   Chief Complaint  Patient presents with   ulcer on right great toe     Patient is here right foot great toe ulcer.    Subjective:  56 y.o. male with PMHx of diabetes mellitus, PSxHx left great toe amputation 05/16/2021 presenting today for follow-up evaluation of an ulcer to the plantar aspect of the right great toe.   Past Medical History:  Diagnosis Date   Asthma    Diabetes mellitus without complication ([place])    History of chicken pox    History of DVT (deep vein thrombosis)    s/p trauma of R leg -- 2014. No other history of DVT   History of kidney stones    Hypertension    Lazy eye of left side     Past Surgical History:  Procedure Laterality Date   AMPUTATION TOE Left 05/16/2021   Procedure: AMPUTATION LEFT GREAT  TOE;  Surgeon: Trula Slade, DPM;  Location: [place];  Service: Podiatry;  Laterality: Left;   EYE SURGERY      Allergies  Allergen Reactions   Zofran [Ondansetron Hcl] Nausea Only    Abdominal pain       Objective/Physical Exam General: The patient is alert and oriented x3 in no acute distress.  Dermatology:  Recurrent deterioration of the plantar great toe wound.   To the noted ulceration(s), there is no eschar.  Significant improvement overall of the wound.  Granular wound base.  Periwound intact.  No exposed bone muscle tendon ligament joint.  Hyperkeratotic elongated nails also noted 1-5 bilateral.  Vascular: Palpable pedal pulses bilaterally. No edema or erythema noted. Capillary refill within normal limits.  Neurological: Epicritic and protective threshold diminished bilaterally.   Musculoskeletal Exam: History of prior amputation left great toe 05/16/2021  Radiographic exam RT foot 10/17/2021: Normal osseous mineralization.  No obvious sign of osseous irregularity or erosions around the great toe.  There does appear to be an accessory IPJ bone visualized on AP and medial oblique views.    Assessment: 1.  Ulcer right great toe secondary  to diabetes mellitus 2. diabetes mellitus w/ peripheral neuropathy 3. PSxHx amputation LT great toe 05/16/2021 4.  Pain due to onychomycosis of toenails both  Plan of Care:  1. Patient was evaluated.  2. medically necessary excisional debridement including subcutaneous tissue was performed using a tissue nipper and a chisel blade. Excisional debridement of all the necrotic nonviable tissue down to healthy bleeding viable tissue was performed with post-debridement measurements same as pre-. 3. the wound was cleansed and dry sterile dressing applied. 4.  Recommend betadine wet to dry dressing. 5.  Continue CAM boot 6.  Patient has ordered some diabetic insoles that are pending delivery. 7.  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 [name], DPM    [address], [place] [address]                Office [phone]  Fax [phone]

## 2021-12-27 LAB — WOUND CULTURE
MICRO NUMBER:: 13995417
SPECIMEN QUALITY:: ADEQUATE

## 2021-12-27 LAB — HOUSE ACCOUNT TRACKING

## 2022-01-07 ENCOUNTER — Ambulatory Visit: Payer: 59 | Admitting: Podiatry

## 2022-01-07 DIAGNOSIS — M79675 Pain in left toe(s): Secondary | ICD-10-CM | POA: Diagnosis not present

## 2022-01-07 DIAGNOSIS — M79674 Pain in right toe(s): Secondary | ICD-10-CM

## 2022-01-07 DIAGNOSIS — B351 Tinea unguium: Secondary | ICD-10-CM

## 2022-01-07 DIAGNOSIS — L97512 Non-pressure chronic ulcer of other part of right foot with fat layer exposed: Secondary | ICD-10-CM

## 2022-01-07 DIAGNOSIS — E0843 Diabetes mellitus due to underlying condition with diabetic autonomic (poly)neuropathy: Secondary | ICD-10-CM

## 2022-01-07 NOTE — Progress Notes (Signed)
   Chief Complaint  Patient presents with   follow-up    Patient is here for right foot great toe pain.patient states that the toe is healing slowly.    Subjective:  56 y.o. male with PMHx of diabetes mellitus, PSxHx left great toe amputation 05/16/2021 presenting today for follow-up evaluation of an ulcer to the plantar aspect of the right great toe.  Patient also requesting a nail trim today.  He is unable to trim his own nails.  Past Medical History:  Diagnosis Date   Asthma    Diabetes mellitus without complication ([place])    History of chicken pox    History of DVT (deep vein thrombosis)    s/p trauma of R leg -- 2014. No other history of DVT   History of kidney stones    Hypertension    Lazy eye of left side     Past Surgical History:  Procedure Laterality Date   AMPUTATION TOE Left 05/16/2021   Procedure: AMPUTATION LEFT GREAT  TOE;  Surgeon: Trula Slade, DPM;  Location: [place];  Service: Podiatry;  Laterality: Left;   EYE SURGERY      Allergies  Allergen Reactions   Zofran [Ondansetron Hcl] Nausea Only    Abdominal pain     Objective/Physical Exam General: The patient is alert and oriented x3 in no acute distress.  Dermatology:  Overall significant improvement of the wound.  It measures approximately 1.0 x 0.5 x 0.2 cm.  Granular wound base.  Periwound intact.  No exposed bone muscle tendon ligament joint.  Hyperkeratotic elongated nails also noted 1-5 bilateral.  Vascular: Palpable pedal pulses bilaterally. No edema or erythema noted. Capillary refill within normal limits.  Neurological: Epicritic and protective threshold diminished bilaterally.   Musculoskeletal Exam: History of prior amputation left great toe 05/16/2021  Radiographic exam RT foot 10/17/2021: Normal osseous mineralization.  No obvious sign of osseous irregularity or erosions around the great toe.  There does appear to be an accessory IPJ bone visualized on AP and medial oblique views.     Assessment: 1.  Ulcer right great toe secondary to diabetes mellitus 2. diabetes mellitus w/ peripheral neuropathy 3. PSxHx amputation LT great toe 05/16/2021 4.  Pain due to onychomycosis of toenails both  Plan of Care:  1. Patient was evaluated.  2. medically necessary excisional debridement including subcutaneous tissue was performed using a tissue nipper and a chisel blade. Excisional debridement of all the necrotic nonviable tissue down to healthy bleeding viable tissue was performed with post-debridement measurements same as pre-. 3.  Mechanical debridement of the bilateral toenails was performed today using a nail nipper without incident or bleeding 4.  Continue diabetic shoes with insoles 5.  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 [name], DPM    [address], [place] [address]                Office [phone]  Fax [phone]

## 2022-02-04 ENCOUNTER — Ambulatory Visit: Payer: 59 | Admitting: Podiatry

## 2022-02-04 DIAGNOSIS — E0843 Diabetes mellitus due to underlying condition with diabetic autonomic (poly)neuropathy: Secondary | ICD-10-CM | POA: Diagnosis not present

## 2022-02-04 DIAGNOSIS — L97512 Non-pressure chronic ulcer of other part of right foot with fat layer exposed: Secondary | ICD-10-CM

## 2022-02-04 MED ORDER — GENTAMICIN SULFATE 0.1 % EX CREA: 1.0000 | TOPICAL_CREAM | Freq: Two times a day (BID) | CUTANEOUS | 1 refills | Status: DC

## 2022-02-04 NOTE — Progress Notes (Signed)
   Chief Complaint  Patient presents with   follow-up right foot    Patient states that he is here for follow-up for right foot ulcer great toe.    Subjective:  56 y.o. male with PMHx of diabetes mellitus, PSxHx left great toe amputation 05/16/2021 presenting today for follow-up evaluation of an ulcer to the plantar aspect of the right great toe.  Patient also requesting a nail trim today.  He is unable to trim his own nails.  Past Medical History:  Diagnosis Date   Asthma    Diabetes mellitus without complication (HCC)    History of chicken pox    History of DVT (deep vein thrombosis)    s/p trauma of R leg -- 2014. No other history of DVT   History of kidney stones    Hypertension    Lazy eye of left side     Past Surgical History:  Procedure Laterality Date   AMPUTATION TOE Left 05/16/2021   Procedure: AMPUTATION LEFT GREAT  TOE;  Surgeon: Vivi Barrack, DPM;  Location: [facility];  Service: Podiatry;  Laterality: Left;   EYE SURGERY      Allergies  Allergen Reactions   Zofran [Ondansetron Hcl] Nausea Only    Abdominal pain     Objective/Physical Exam General: The patient is alert and oriented x3 in no acute distress.  Dermatology:  Overall significant improvement of the wound.  It measures approximately 1.0 x 0.5 x 0.2 cm.  Granular wound base.  Periwound intact.  No exposed bone muscle tendon ligament joint.  Hyperkeratotic elongated nails also noted 1-5 bilateral.  Vascular: Palpable pedal pulses bilaterally. No edema or erythema noted. Capillary refill within normal limits.  Neurological: Epicritic and protective threshold diminished bilaterally.   Musculoskeletal Exam: History of prior amputation left great toe 05/16/2021  Radiographic exam RT foot 10/17/2021: Normal osseous mineralization.  No obvious sign of osseous irregularity or erosions around the great toe.  There does appear to be an accessory IPJ bone visualized on AP and medial oblique views.     Assessment: 1.  Ulcer right great toe secondary to diabetes mellitus 2. diabetes mellitus w/ peripheral neuropathy 3. PSxHx amputation LT great toe 05/16/2021 4.  Pain due to onychomycosis of toenails both  Plan of Care:  1. Patient was evaluated.  2. medically necessary excisional debridement including subcutaneous tissue was performed using a tissue nipper and a chisel blade. Excisional debridement of all the necrotic nonviable tissue down to healthy bleeding viable tissue was performed with post-debridement measurements same as pre-. 3. Continue diabetic shoes with insoles 4.  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 Felecia Shelling, DPM    [address] [place], [place] [address]                Office [phone]  Fax [phone]

## 2022-03-01 ENCOUNTER — Ambulatory Visit
Admission: RE | Admit: 2022-03-01 | Discharge: 2022-03-01 | Disposition: A | Discharge status: Home / Self Care | Payer: 59 | Source: Ambulatory Visit | Attending: Physician Assistant | Admitting: Physician Assistant

## 2022-03-01 VITALS — BP 178/130 | HR 110 | Temp 97.7°F | Resp 18

## 2022-03-01 DIAGNOSIS — J45901 Unspecified asthma with (acute) exacerbation: Secondary | ICD-10-CM | POA: Diagnosis not present

## 2022-03-01 DIAGNOSIS — I160 Hypertensive urgency: Secondary | ICD-10-CM

## 2022-03-01 MED ORDER — TRIAMCINOLONE ACETONIDE 40 MG/ML IJ SUSP
40.0000 mg | Freq: Once | INTRAMUSCULAR | Status: AC
  Administered 2022-03-01: 40 mg via INTRAMUSCULAR

## 2022-03-01 NOTE — ED Provider Notes (Signed)
[facility]    CSN: [id number] Arrival date & time: 03/01/22  1733      History   Chief Complaint Chief Complaint  Patient presents with   Cough    Entered by patient    HPI John [name] is a 56 y.o. male.   Patient here today for evaluation of cough and congestion that he feels is likely related to asthma exacerbation.  He states that he had upper respiratory symptoms at the beginning of the week and has slowly developed more shortness of breath that is typical for asthma exacerbation.  He denies any chest pain.  He has not had any fever.  He has been taking medications as prescribed but states he did not take his blood pressure medication today.  Patient notes that his blood pressure in office is similar to that when he does not take his medication.  The history is provided by the patient.  Cough Associated symptoms: shortness of breath   Associated symptoms: no chills, no ear pain, no eye discharge, no fever and no sore throat     Past Medical History:  Diagnosis Date   Asthma    Diabetes mellitus without complication ([place])    History of chicken pox    History of DVT (deep vein thrombosis)    s/p trauma of R leg -- 2014. No other history of DVT   History of kidney stones    Hypertension    Lazy eye of left side     Patient Active Problem List   Diagnosis Date Noted   Nausea & vomiting 05/17/2021   Positive D dimer 05/16/2021   COVID-19 virus infection 05/15/2021   Hyponatremia 05/15/2021   Normocytic anemia 05/15/2021   Osteomyelitis of great toe of left foot ([place]) 05/14/2021   CKD (chronic kidney disease) stage 3, GFR 30-59 ml/min ([place]) 05/14/2021   Diabetes mellitus due to underlying condition with diabetic autonomic neuropathy, unspecified whether long term insulin use ([place]) 07/24/2020   Hyperlipidemia LDL goal <100 12/06/2015   Hypertension 04/06/2012   Type 2 diabetes mellitus with hyperglycemia, with long-term current use of insulin ([place])  03/20/2012   History of DVT (deep vein thrombosis) 03/19/2012    Past Surgical History:  Procedure Laterality Date   AMPUTATION TOE Left 05/16/2021   Procedure: AMPUTATION LEFT GREAT  TOE;  Surgeon: Trula Slade, DPM;  Location: [place];  Service: Podiatry;  Laterality: Left;   EYE SURGERY         Home Medications    Prior to Admission medications   Medication Sig Start Date End Date Taking? Authorizing Provider  albuterol (VENTOLIN HFA) 108 (90 Base) MCG/ACT inhaler Inhale 2 puffs into the lungs every 6 (six) hours as needed for wheezing or shortness of breath. 10/21/19   Wendling, [name], DO  amLODipine (NORVASC) 5 MG tablet Take 1 tablet (5 mg total) by mouth daily. 10/19/21   [name], DO  aspirin 81 MG chewable tablet Chew 81 mg by mouth daily.    [provider]  atorvastatin (LIPITOR) 20 MG tablet Take 1 tablet (20 mg total) by mouth daily. 10/19/21   [name], DO  blood glucose meter kit and supplies KIT Dispense based on patient and insurance preference. Use up to four times daily as directed. (FOR ICD-9 250.00, 250.01). 05/19/21   Regalado, Belkys A, MD  carvedilol (COREG) 25 MG tablet Take 1 tablet (25 mg total) by mouth 2 (two) times daily with a meal. 10/19/21  [name], DO  dapagliflozin propanediol (FARXIGA) 10 MG TABS tablet Take 1 tablet (10 mg total) by mouth daily before breakfast. 10/19/21   Nani Ravens, [name], DO  famotidine (PEPCID) 20 MG tablet Take 1 tablet (20 mg total) by mouth 2 (two) times daily. 05/19/21   Regalado, Belkys A, MD  gentamicin cream (GARAMYCIN) 0.1 % Apply 1 Application topically 2 (two) times daily. 02/04/22   [name], DPM  glucose blood test strip Use as instructed, Check blood sugars twice daily 05/19/21   Regalado, Belkys A, MD  insulin glargine-yfgn (SEMGLEE, YFGN,) 100 UNIT/ML Pen Inject 12 Units into the skin at bedtime. 10/19/21   [name], DO  Insulin Pen  Needle (NOVOFINE PLUS PEN NEEDLE) 32G X 4 MM MISC Use daily to insulin 05/19/21   Regalado, Belkys A, MD  Lancets (ONETOUCH ULTRASOFT) lancets Use as instructed, Check blood sugars twice daily 05/19/21   Regalado, Belkys A, MD  levocetirizine (XYZAL) 5 MG tablet Take 1 tablet (5 mg total) by mouth every evening. Patient taking differently: Take 5 mg by mouth daily as needed for allergies. 12/01/19   [name], DO  lisinopril (ZESTRIL) 20 MG tablet Take 1 tablet (20 mg total) by mouth daily. 10/19/21   [name], DO  metFORMIN (GLUCOPHAGE-XR) 500 MG 24 hr tablet Take 2 tablets (1,000 mg total) by mouth in the morning and at bedtime. 10/19/21   [name], DO  sulfamethoxazole-trimethoprim (BACTRIM DS) 800-160 MG tablet Take 1 tablet by mouth 2 (two) times daily. 10/18/21   [name], DPM    Family History Family History  Problem Relation Age of Onset   Heart disease Mother 43   Arthritis Mother    Heart disease Father 64   Stroke Father    Healthy Brother    Heart disease Maternal Grandmother    Diabetes Maternal Uncle     Social History Social History   Tobacco Use   Smoking status: Never   Smokeless tobacco: Never  Substance Use Topics   Alcohol use: No   Drug use: No     Allergies   Zofran [ondansetron hcl]   Review of Systems Review of Systems  Constitutional:  Negative for chills and fever.  HENT:  Negative for congestion, ear pain and sore throat.   Eyes:  Negative for discharge and redness.  Respiratory:  Positive for cough and shortness of breath.   Gastrointestinal:  Negative for abdominal pain, nausea and vomiting.     Physical Exam Triage Vital Signs ED Triage Vitals  Enc Vitals Group     BP      Pulse      Resp      Temp      Temp src      SpO2      Weight      Height      Head Circumference      Peak Flow      Pain Score      Pain Loc      Pain Edu?      Excl. in Index?    No data found.  Updated Vital  Signs BP (!) 178/130 (BP Location: Left Arm)   Pulse (!) 110   Temp 97.7 F (36.5 C) (Oral)   Resp 18   SpO2 98%      Physical Exam Vitals and nursing note reviewed.  Constitutional:      General: He is not in acute  distress.    Appearance: Normal appearance. He is not ill-appearing.  HENT:     Head: Normocephalic and atraumatic.     Nose: Nose normal. No congestion.  Eyes:     Conjunctiva/sclera: Conjunctivae normal.  Cardiovascular:     Rate and Rhythm: Normal rate and regular rhythm.     Heart sounds: Normal heart sounds. No murmur heard. Pulmonary:     Effort: Pulmonary effort is normal.     Breath sounds: Normal breath sounds. No wheezing, rhonchi or rales.     Comments: Mild shortness of breath noted when speaking in full sentences Skin:    General: Skin is warm and dry.  Neurological:     Mental Status: He is alert.  Psychiatric:        Mood and Affect: Mood normal.        Thought Content: Thought content normal.      UC Treatments / Results  Labs (all labs ordered are listed, but only abnormal results are displayed) Labs Reviewed - No data to display  EKG   Radiology No results found.  Procedures Procedures (including critical care time)  Medications Ordered in UC Medications  triamcinolone acetonide (KENALOG-40) injection 40 mg (40 mg Intramuscular Given 03/01/22 1913)    Initial Impression / Assessment and Plan / UC Course  I have reviewed the triage vital signs and the nursing notes.  Pertinent labs & imaging results that were available during my care of the patient were reviewed by me and considered in my medical decision making (see chart for details).    Patient with significantly elevated blood pressure in office.  He has had similar in the past and was instructed to report to the emergency room however failed to do so.  Recommended he take medication as prescribed.  No current symptoms concerning for hypertensive emergency.  I did discuss  possibility of PE and concern for same given history.  Patient does not have any current chest pain and oxygen saturation is normal which is promising however with tachycardia and elevated blood pressure with history of DVT recommended further evaluation in the emergency department with any persistent or worsening symptoms over the next 12 hours.  Patient given steroid injection in office versus oral steroids given diabetes.  Encouraged follow-up with any further concerns.   Final Clinical Impressions(s) / UC Diagnoses   Final diagnoses:  Asthma with acute exacerbation, unspecified asthma severity, unspecified whether persistent  Hypertensive urgency   Discharge Instructions   None    ED Prescriptions   None    PDMP not reviewed this encounter.   Francene Finders, PA-C 03/01/22 1928

## 2022-03-01 NOTE — ED Triage Notes (Signed)
Pt c/o cough onset ~ Monday Hx of asthma concerned for exacerbation. Denies sore throat, headache, nasal congestion.   Not best historian of current medications. States taking everything in the computer as prescribed. States did not take BP med today.

## 2022-03-03 ENCOUNTER — Emergency Department (HOSPITAL_COMMUNITY): Payer: 59

## 2022-03-03 ENCOUNTER — Other Ambulatory Visit: Payer: Self-pay

## 2022-03-03 ENCOUNTER — Emergency Department (HOSPITAL_COMMUNITY)
Admission: EM | Admit: 2022-03-03 | Discharge: 2022-03-03 | Disposition: A | Discharge status: Home / Self Care | Payer: 59 | Attending: Emergency Medicine | Admitting: Emergency Medicine

## 2022-03-03 DIAGNOSIS — R000 Tachycardia, unspecified: Secondary | ICD-10-CM | POA: Diagnosis not present

## 2022-03-03 DIAGNOSIS — Z20822 Contact with and (suspected) exposure to covid-19: Secondary | ICD-10-CM | POA: Insufficient documentation

## 2022-03-03 DIAGNOSIS — E119 Type 2 diabetes mellitus without complications: Secondary | ICD-10-CM

## 2022-03-03 DIAGNOSIS — Z7982 Long term (current) use of aspirin: Secondary | ICD-10-CM | POA: Diagnosis not present

## 2022-03-03 DIAGNOSIS — I110 Hypertensive heart disease with heart failure: Secondary | ICD-10-CM | POA: Diagnosis not present

## 2022-03-03 DIAGNOSIS — E1165 Type 2 diabetes mellitus with hyperglycemia: Secondary | ICD-10-CM | POA: Insufficient documentation

## 2022-03-03 DIAGNOSIS — Z7984 Long term (current) use of oral hypoglycemic drugs: Secondary | ICD-10-CM | POA: Diagnosis not present

## 2022-03-03 DIAGNOSIS — Z7951 Long term (current) use of inhaled steroids: Secondary | ICD-10-CM | POA: Insufficient documentation

## 2022-03-03 DIAGNOSIS — J45909 Unspecified asthma, uncomplicated: Secondary | ICD-10-CM | POA: Diagnosis not present

## 2022-03-03 DIAGNOSIS — Z79899 Other long term (current) drug therapy: Secondary | ICD-10-CM | POA: Diagnosis not present

## 2022-03-03 DIAGNOSIS — I509 Heart failure, unspecified: Secondary | ICD-10-CM | POA: Diagnosis not present

## 2022-03-03 DIAGNOSIS — R0602 Shortness of breath: Secondary | ICD-10-CM

## 2022-03-03 DIAGNOSIS — J40 Bronchitis, not specified as acute or chronic: Secondary | ICD-10-CM

## 2022-03-03 DIAGNOSIS — Z794 Long term (current) use of insulin: Secondary | ICD-10-CM | POA: Insufficient documentation

## 2022-03-03 DIAGNOSIS — R739 Hyperglycemia, unspecified: Secondary | ICD-10-CM

## 2022-03-03 DIAGNOSIS — I5021 Acute systolic (congestive) heart failure: Secondary | ICD-10-CM

## 2022-03-03 DIAGNOSIS — I10 Essential (primary) hypertension: Secondary | ICD-10-CM

## 2022-03-03 LAB — BRAIN NATRIURETIC PEPTIDE: B Natriuretic Peptide: 576 pg/mL — ABNORMAL HIGH (ref 0.0–100.0)

## 2022-03-03 LAB — BASIC METABOLIC PANEL
Anion gap: 9 (ref 5–15)
BUN: 20 mg/dL (ref 6–20)
CO2: 23 mmol/L (ref 22–32)
Calcium: 8.9 mg/dL (ref 8.9–10.3)
Chloride: 104 mmol/L (ref 98–111)
Creatinine, Ser: 1.44 mg/dL — ABNORMAL HIGH (ref 0.61–1.24)
GFR, Estimated: 57 mL/min — ABNORMAL LOW (ref >60)
Glucose, Bld: 425 mg/dL — ABNORMAL HIGH (ref 70–99)
Potassium: 3.9 mmol/L (ref 3.5–5.1)
Sodium: 136 mmol/L (ref 135–145)

## 2022-03-03 LAB — CBC
HCT: 35.9 % — ABNORMAL LOW (ref 39.0–52.0)
Hemoglobin: 12.5 g/dL — ABNORMAL LOW (ref 13.0–17.0)
MCH: 31.8 pg (ref 26.0–34.0)
MCHC: 34.8 g/dL (ref 30.0–36.0)
MCV: 91.3 fL (ref 80.0–100.0)
Platelets: 209 10*3/uL (ref 150–400)
RBC: 3.93 MIL/uL — ABNORMAL LOW (ref 4.22–5.81)
RDW: 12.4 % (ref 11.5–15.5)
WBC: 8.4 10*3/uL (ref 4.0–10.5)
nRBC: 0 % (ref 0.0–0.2)

## 2022-03-03 LAB — RESP PANEL BY RT-PCR (FLU A&B, COVID) ARPGX2
Influenza A by PCR: NEGATIVE
Influenza B by PCR: NEGATIVE
SARS Coronavirus 2 by RT PCR: NEGATIVE

## 2022-03-03 LAB — D-DIMER, QUANTITATIVE: D-Dimer, Quant: 3.22 ug/mL-FEU — ABNORMAL HIGH (ref 0.00–0.50)

## 2022-03-03 LAB — CBG MONITORING, ED: Glucose-Capillary: 136 mg/dL — ABNORMAL HIGH (ref 70–99)

## 2022-03-03 MED ORDER — LISINOPRIL 20 MG PO TABS: 20.0000 mg | ORAL_TABLET | Freq: Every day | ORAL | 0 refills | Status: DC

## 2022-03-03 MED ORDER — FUROSEMIDE 10 MG/ML IJ SOLN
20.0000 mg | Freq: Once | INTRAMUSCULAR | Status: AC
  Administered 2022-03-03: 20 mg via INTRAVENOUS
  Filled 2022-03-03: qty 2

## 2022-03-03 MED ORDER — CARVEDILOL 12.5 MG PO TABS
25.0000 mg | ORAL_TABLET | Freq: Once | ORAL | Status: AC
  Administered 2022-03-03: 25 mg via ORAL
  Filled 2022-03-03: qty 2

## 2022-03-03 MED ORDER — ALBUTEROL SULFATE HFA 108 (90 BASE) MCG/ACT IN AERS: 2.0000 | INHALATION_SPRAY | RESPIRATORY_TRACT | 1 refills | Status: DC | PRN

## 2022-03-03 MED ORDER — AZITHROMYCIN 250 MG PO TABS
500.0000 mg | ORAL_TABLET | Freq: Once | ORAL | Status: AC
  Administered 2022-03-03: 500 mg via ORAL
  Filled 2022-03-03: qty 2

## 2022-03-03 MED ORDER — AMLODIPINE BESYLATE 5 MG PO TABS
5.0000 mg | ORAL_TABLET | Freq: Once | ORAL | Status: AC
  Administered 2022-03-03: 5 mg via ORAL
  Filled 2022-03-03: qty 1

## 2022-03-03 MED ORDER — AMLODIPINE BESYLATE 5 MG PO TABS: 5.0000 mg | ORAL_TABLET | Freq: Every day | ORAL | 0 refills | Status: DC

## 2022-03-03 MED ORDER — AZITHROMYCIN 250 MG PO TABS: 250.0000 mg | ORAL_TABLET | Freq: Every day | ORAL | 0 refills | Status: AC

## 2022-03-03 MED ORDER — GUAIFENESIN-DM 100-10 MG/5ML PO SYRP
15.0000 mL | ORAL_SOLUTION | ORAL | Status: DC | PRN
  Administered 2022-03-03: 15 mL via ORAL
  Filled 2022-03-03: qty 15

## 2022-03-03 MED ORDER — CARVEDILOL 25 MG PO TABS: 25.0000 mg | ORAL_TABLET | Freq: Two times a day (BID) | ORAL | 0 refills | Status: DC

## 2022-03-03 MED ORDER — LISINOPRIL 20 MG PO TABS
20.0000 mg | ORAL_TABLET | Freq: Once | ORAL | Status: AC
  Administered 2022-03-03: 20 mg via ORAL
  Filled 2022-03-03: qty 1

## 2022-03-03 MED ORDER — INSULIN ASPART 100 UNIT/ML IJ SOLN
12.0000 [IU] | Freq: Once | INTRAMUSCULAR | Status: AC
  Administered 2022-03-03: 12 [IU] via SUBCUTANEOUS

## 2022-03-03 MED ORDER — HYDRALAZINE HCL 20 MG/ML IJ SOLN
10.0000 mg | Freq: Once | INTRAMUSCULAR | Status: AC
  Administered 2022-03-03: 10 mg via INTRAVENOUS
  Filled 2022-03-03: qty 1

## 2022-03-03 MED ORDER — IOHEXOL 350 MG/ML SOLN
75.0000 mL | Freq: Once | INTRAVENOUS | Status: AC | PRN
  Administered 2022-03-03: 75 mL via INTRAVENOUS

## 2022-03-03 MED ORDER — ALBUTEROL SULFATE HFA 108 (90 BASE) MCG/ACT IN AERS
2.0000 | INHALATION_SPRAY | Freq: Once | RESPIRATORY_TRACT | Status: AC
  Administered 2022-03-03: 2 via RESPIRATORY_TRACT
  Filled 2022-03-03: qty 6.7

## 2022-03-03 NOTE — ED Provider Notes (Signed)
[facility] EMERGENCY DEPARTMENT Provider Note   CSN: [id number] Arrival date & time: 03/03/22  0015     History  Chief Complaint  Patient presents with   Shortness of Breath    John [name] is a 56 y.o. male.  Pt with hx asthma, c/o non prod cough and congestion in the past week. Symptoms acute onset, moderate, persistent. Was at urgent care and was given steroid shot, but denies resolution of symptoms. Uses inhaler prn. No sore throat or runny nose. No chest pain or discomfort. +sob. No abd pain or nvd. No extremity pain or swelling.   The history is provided by the patient, medical records and the EMS personnel.  Shortness of Breath Associated symptoms: cough   Associated symptoms: no abdominal pain, no chest pain, no fever, no headaches, no neck pain, no rash, no sore throat and no vomiting        Home Medications Prior to Admission medications   Medication Sig Start Date End Date Taking? Authorizing Provider  amLODipine (NORVASC) 5 MG tablet Take 1 tablet (5 mg total) by mouth daily. 10/19/21  Yes [name], DO  aspirin 81 MG chewable tablet Chew 81 mg by mouth daily.   Yes [provider]  atorvastatin (LIPITOR) 20 MG tablet Take 1 tablet (20 mg total) by mouth daily. 10/19/21  Yes [name], DO  carvedilol (COREG) 25 MG tablet Take 1 tablet (25 mg total) by mouth 2 (two) times daily with a meal. 10/19/21  Yes Wendling, [name], DO  dapagliflozin propanediol (FARXIGA) 10 MG TABS tablet Take 1 tablet (10 mg total) by mouth daily before breakfast. 10/19/21  Yes Wendling, [name], DO  gentamicin cream (GARAMYCIN) 0.1 % Apply 1 Application topically 2 (two) times daily. 02/04/22  Yes [name], DPM  insulin glargine-yfgn (SEMGLEE, YFGN,) 100 UNIT/ML Pen Inject 12 Units into the skin at bedtime. 10/19/21  Yes Wendling, [name], DO  levocetirizine (XYZAL) 5 MG tablet Take 1 tablet (5 mg total) by mouth  every evening. Patient taking differently: Take 5 mg by mouth daily as needed for allergies. 12/01/19  Yes [name], DO  lisinopril (ZESTRIL) 20 MG tablet Take 1 tablet (20 mg total) by mouth daily. 10/19/21  Yes [name], DO  metFORMIN (GLUCOPHAGE-XR) 500 MG 24 hr tablet Take 2 tablets (1,000 mg total) by mouth in the morning and at bedtime. 10/19/21  Yes [name], DO  albuterol (VENTOLIN HFA) 108 (90 Base) MCG/ACT inhaler Inhale 2 puffs into the lungs every 6 (six) hours as needed for wheezing or shortness of breath. 10/21/19   Wendling, [name], DO  blood glucose meter kit and supplies KIT Dispense based on patient and insurance preference. Use up to four times daily as directed. (FOR ICD-9 250.00, 250.01). 05/19/21   Regalado, Belkys A, MD  glucose blood test strip Use as instructed, Check blood sugars twice daily 05/19/21   Regalado, Belkys A, MD  Insulin Pen Needle (NOVOFINE PLUS PEN NEEDLE) 32G X 4 MM MISC Use daily to insulin 05/19/21   Regalado, Belkys A, MD  Lancets (ONETOUCH ULTRASOFT) lancets Use as instructed, Check blood sugars twice daily 05/19/21   Regalado, Cassie Freer, MD      Allergies    Zofran Alvis Lemmings hcl]    Review of Systems   Review of Systems  Constitutional:  Negative for chills and fever.  HENT:  Negative for sore throat.   Eyes:  Negative for  redness.  Respiratory:  Positive for cough and shortness of breath.   Cardiovascular:  Negative for chest pain and leg swelling.  Gastrointestinal:  Negative for abdominal pain, nausea and vomiting.  Genitourinary:  Negative for flank pain.  Musculoskeletal:  Negative for back pain and neck pain.  Skin:  Negative for rash.  Neurological:  Negative for headaches.  Hematological:  Does not bruise/bleed easily.  Psychiatric/Behavioral:  Negative for confusion.     Physical Exam Updated Vital Signs BP (!) 163/101   Pulse 96   Temp 98.2 F (36.8 C) (Oral)   Resp (!) 23   Ht  1.778 m (_0 )   Wt 79.8 kg   SpO2 93%   BMI 25.24 kg/m  Physical Exam Vitals and nursing note reviewed.  Constitutional:      Appearance: Normal appearance. He is well-developed.  HENT:     Head: Atraumatic.     Nose: Nose normal.     Mouth/Throat:     Mouth: Mucous membranes are moist.     Pharynx: Oropharynx is clear.  Eyes:     General: No scleral icterus.    Conjunctiva/sclera: Conjunctivae normal.  Neck:     Trachea: No tracheal deviation.  Cardiovascular:     Rate and Rhythm: Normal rate and regular rhythm.     Pulses: Normal pulses.     Heart sounds: Normal heart sounds. No murmur heard.    No friction rub. No gallop.  Pulmonary:     Effort: Pulmonary effort is normal. No accessory muscle usage or respiratory distress.     Breath sounds: Normal breath sounds.  Abdominal:     General: There is no distension.     Palpations: Abdomen is soft.     Tenderness: There is no abdominal tenderness.  Genitourinary:    Comments: No cva tenderness. Musculoskeletal:        General: No swelling or tenderness.     Cervical back: Normal range of motion and neck supple. No rigidity.     Right lower leg: No edema.     Left lower leg: No edema.     Comments: Very small ulcer to plantar aspect right great toe. Appears clean. No necrotic tissue. No malodor. No discharge. (Appears c/w or mildly improved as compared to recently photo from podiatry visit).   Skin:    General: Skin is warm and dry.     Findings: No rash.  Neurological:     Mental Status: He is alert.     Comments: Alert, speech clear.   Psychiatric:        Mood and Affect: Mood normal.     ED Results / Procedures / Treatments   Labs (all labs ordered are listed, but only abnormal results are displayed) Results for orders placed or performed during the hospital encounter of 03/03/22  Resp Panel by RT-PCR (Flu A&B, Covid) Anterior Nasal Swab   Specimen: Anterior Nasal Swab  Result Value Ref Range   SARS  Coronavirus 2 by RT PCR NEGATIVE NEGATIVE   Influenza A by PCR NEGATIVE NEGATIVE   Influenza B by PCR NEGATIVE NEGATIVE  CBC  Result Value Ref Range   WBC 8.4 4.0 - 10.5 K/uL   RBC 3.93 (L) 4.22 - 5.81 MIL/uL   Hemoglobin 12.5 (L) 13.0 - 17.0 g/dL   HCT 35.9 (L) 39.0 - 52.0 %   MCV 91.3 80.0 - 100.0 fL   MCH 31.8 26.0 - 34.0 pg   MCHC 34.8 30.0 - 36.0  g/dL   RDW 12.4 11.5 - 15.5 %   Platelets 209 150 - 400 K/uL   nRBC 0.0 0.0 - 0.2 %  Basic metabolic panel  Result Value Ref Range   Sodium 136 135 - 145 mmol/L   Potassium 3.9 3.5 - 5.1 mmol/L   Chloride 104 98 - 111 mmol/L   CO2 23 22 - 32 mmol/L   Glucose, Bld 425 (H) 70 - 99 mg/dL   BUN 20 6 - 20 mg/dL   Creatinine, Ser 1.44 (H) 0.61 - 1.24 mg/dL   Calcium 8.9 8.9 - 10.3 mg/dL   GFR, Estimated 57 (L) >60 mL/min   Anion gap 9 5 - 15  D-dimer, quantitative  Result Value Ref Range   D-Dimer, Quant 3.22 (H) 0.00 - 0.50 ug/mL-FEU  Brain natriuretic peptide  Result Value Ref Range   B Natriuretic Peptide 576.0 (H) 0.0 - 100.0 pg/mL     EKG EKG Interpretation  Date/Time:  Sunday March 03 2022 02:02:38 EST Ventricular Rate:  103 PR Interval:  144 QRS Duration: 90 QT Interval:  384 QTC Calculation: 503 R Axis:   90 Text Interpretation: Sinus tachycardia Borderline right axis deviation Left ventricular hypertrophy Nonspecific T wave abnormality Prolonged QT interval Confirmed by Lajean Saver [phone]) on 03/03/2022 2:31:06 AM  Radiology CT Angio Chest PE W/Cm &/Or Wo Cm  Result Date: 03/03/2022 CLINICAL DATA:  Shortness of breath for 2 weeks with cough and difficulty breathing since developing a cold. EXAM: CT ANGIOGRAPHY CHEST WITH CONTRAST TECHNIQUE: Multidetector CT imaging of the chest was performed using the standard protocol during bolus administration of intravenous contrast. Multiplanar CT image reconstructions and MIPs were obtained to evaluate the vascular anatomy. RADIATION DOSE REDUCTION: This exam was  performed according to the departmental dose-optimization program which includes automated exposure control, adjustment of the mA and/or kV according to patient size and/or use of iterative reconstruction technique. CONTRAST:  58m OMNIPAQUE IOHEXOL 350 MG/ML SOLN COMPARISON:  Chest x-ray from earlier today FINDINGS: Cardiovascular: Satisfactory opacification of the pulmonary arteries to the segmental level. No evidence of acute pulmonary embolism. Left lower lobe posterior basilar segmental pulmonary artery is less enhancing and collapse compared to other similar sized vessels. Generous heart size. No pericardial effusion. Mediastinum/Nodes: No worrisome lymph node. Lungs/Pleura: Patchy ground-glass opacity in the bilateral lungs, history favoring atypical infection although there is some interlobular septal thickening at the bases with generalized airway cuffing. Trace pleural effusions. Upper Abdomen: No acute finding Musculoskeletal: No acute or aggressive finding Review of the MIP images confirms the above findings. IMPRESSION: 1. Patchy ground-glass opacity in the bilateral lungs, concerning for atypical pneumonia given the history but there is interstitial edema and trace pleural effusions. Early alveolar edema is also possible. 2. No acute pulmonary embolism. There is a nonenhancing subsegmental vessel in left lower lobe which is collapsed, possibly remote embolic disease. Electronically Signed   By: JJorje GuildM.D.   On: 03/03/2022 05:00   DG Chest 2 View  Result Date: 03/03/2022 CLINICAL DATA:  Dyspnea, cough EXAM: CHEST - 2 VIEW COMPARISON:  05/15/2021 FINDINGS: The lungs are symmetrically well expanded. Interval development of moderate bilateral perihilar interstitial pulmonary infiltrate, infection versus edema. No pneumothorax or pleural effusion. Cardiac size within normal limits. IMPRESSION: 1. Interval development of moderate bilateral perihilar interstitial pulmonary infiltrate, infection  versus edema. Electronically Signed   By: AFidela SalisburyM.D.   On: 03/03/2022 02:51    Procedures Procedures    Medications Ordered in ED Medications  guaiFENesin-dextromethorphan (ROBITUSSIN DM) 100-10 MG/5ML syrup 15 mL (15 mLs Oral Given 03/03/22 0403)  furosemide (LASIX) injection 20 mg (has no administration in time range)  azithromycin (ZITHROMAX) tablet 500 mg (has no administration in time range)  albuterol (VENTOLIN HFA) 108 (90 Base) MCG/ACT inhaler 2 puff (2 puffs Inhalation Given 03/03/22 0044)  hydrALAZINE (APRESOLINE) injection 10 mg (10 mg Intravenous Given 03/03/22 0325)  insulin aspart (novoLOG) injection 12 Units (12 Units Subcutaneous Given 03/03/22 0330)  amLODipine (NORVASC) tablet 5 mg (5 mg Oral Given 03/03/22 0403)  carvedilol (COREG) tablet 25 mg (25 mg Oral Given 03/03/22 0403)  lisinopril (ZESTRIL) tablet 20 mg (20 mg Oral Given 03/03/22 0403)  iohexol (OMNIPAQUE) 350 MG/ML injection 75 mL (75 mLs Intravenous Contrast Given 03/03/22 0434)    ED Course/ Medical Decision Making/ A&P                           Medical Decision Making Problems Addressed: Acute systolic congestive heart failure ([place]): acute illness or injury with systemic symptoms that poses a threat to life or bodily functions Bronchitis: acute illness or injury with systemic symptoms Essential hypertension: chronic illness or injury with exacerbation, progression, or side effects of treatment that poses a threat to life or bodily functions Hyperglycemia: acute illness or injury that poses a threat to life or bodily functions Insulin dependent type 2 diabetes mellitus ([place]): chronic illness or injury with exacerbation, progression, or side effects of treatment that poses a threat to life or bodily functions Shortness of breath: acute illness or injury with systemic symptoms that poses a threat to life or bodily functions Uncontrolled hypertension: acute illness or injury with systemic symptoms  that poses a threat to life or bodily functions  Amount and/or Complexity of Data Reviewed Independent Historian: spouse    Details: hx External Data Reviewed: labs and notes. Labs: ordered. Decision-making details documented in ED Course. Radiology: ordered and independent interpretation performed. Decision-making details documented in ED Course. ECG/medicine tests: ordered and independent interpretation performed. Decision-making details documented in ED Course.  Risk OTC drugs. Prescription drug management. Decision regarding hospitalization.   Iv ns. Continuous pulse ox and cardiac monitoring. Labs ordered/sent. Imaging ordered.   Diff dx includes pna, bronchitis, viral uri, asthma, chf, etc - dispo decision including potential need for admission considered - will get labs and imaging and reassess.   Reviewed nursing notes and prior charts for additional history. External reports reviewed. Additional history from: so/fam.  Cardiac monitor: sinus rhythm, rate 110.  Labs reviewed/interpreted by me - wbc and hgb normal.   Xrays reviewed/interpreted by me - infiltrates, ?infxn vs chf.  Pts bp is high on recheck. Hx non-compliance w meds, has not taken meds today. Hydralazine iv. Bp mildly improved. Pt also give a dose of his normal bp meds.   Suspect multifactorial etiology of symptoms. Pt does have cough and congestion, ?possible bronchitis vs atypical pna. No wheezing currently. Pt also w uncontrolled htn, non compliance w meds - suspect afterload issue is exacerbating issue/vascular congestion on cxr, bnp is mildly high, no leg edema - lasix dose iv. CTA neg for PE. Pt also with hyperglycemia/hx dm, hco3 is normal, novolog in ED.  Stressed importance of med compliance.   Pt is currently breathing comfortably, o2 sats 97%. No chest pain or discomfort. Bp is improved from prior.  Pt indicates has adequate supply of his bp meds at home, indicates is out of metformin, has his insulin.  Indicates has pcp with whom he can f/u closely.  Pt currently appears stable for d/c. At d/c, requests refill for albuterol as well as bp meds (thinks he has at home, but requests refill to be sure).  Rec close pcp f/u, stress compliance w meds. Return precautions provided/discussed.              Final Clinical Impression(s) / ED Diagnoses Final diagnoses:  Shortness of breath  Essential hypertension  Uncontrolled hypertension  Hyperglycemia  Insulin dependent type 2 diabetes mellitus [facility])    Rx / DC Orders ED Discharge Orders     None         Lajean Saver, MD 03/03/22 [phone]

## 2022-03-03 NOTE — ED Provider Triage Note (Signed)
Emergency Medicine Provider Triage Evaluation Note  John [name] , a 56 y.o. male  was evaluated in triage.  Pt complains of SOB and cough x 2 weeks. Hx of asthma, states it flared after he got a cold and the weather was changing. Went to UC and got steroid shot, no relief. Cough worse when laying down at night.   Review of Systems  Positive: Cough, SOB Negative: Fever, chest tightness  Physical Exam  BP (!) 194/126   Pulse (!) 117   Temp 97.8 F (36.6 C)   Resp (!) 22   Ht 5\' 10"  (1.778 m)   Wt 79.8 kg   SpO2 94%   BMI 25.24 kg/m  Gen:   Awake, no distress   Resp:  Normal effort  MSK:   Moves extremities without difficulty  Other:    Medical Decision Making  Medically screening exam initiated at 12:43 AM.  Appropriate orders placed.  John [name] was informed that the remainder of the evaluation will be completed by another provider, this initial triage assessment does not replace that evaluation, and the importance of remaining in the ED until their evaluation is complete.  Workup initiated   Lacretia Leigh, PA-C 03/03/22 14/10/23

## 2022-03-03 NOTE — Discharge Instructions (Addendum)
It was our pleasure to provide your ER care today - we hope that you feel better.  Your blood pressure and blood sugar are very high - it is very important that you take your blood pressure meds and diabetes meds as prescribed by your doctors.  Follow diabetes/heart healthy eating plan. Monitor blood sugar and blood pressure and record values (and bring with you to your doctor at follow up).  Follow up closely with your doctor/cardiologist this week for recheck of recent symptoms, and for recheck of your blood pressure and blood sugar.   Use your albuterol inhaler as need. You may take mucinex or nyquil as need for cough/symptom relief (available over the counter).   Return to ER right away if worse, new symptoms, increased trouble breathing, chest pain, or other emergency concern.

## 2022-03-03 NOTE — ED Triage Notes (Signed)
The pt has been sob for 2 weeks he has been coughing and his breathing has been worse since he developed a cold

## 2022-03-05 ENCOUNTER — Encounter: Payer: Self-pay | Admitting: Podiatry

## 2022-03-05 ENCOUNTER — Encounter: Payer: Self-pay | Admitting: Family Medicine

## 2022-03-05 ENCOUNTER — Ambulatory Visit: Payer: 59 | Admitting: Family Medicine

## 2022-03-05 ENCOUNTER — Other Ambulatory Visit (HOSPITAL_COMMUNITY): Payer: Self-pay

## 2022-03-05 DIAGNOSIS — Z794 Long term (current) use of insulin: Secondary | ICD-10-CM | POA: Diagnosis not present

## 2022-03-05 DIAGNOSIS — I10 Essential (primary) hypertension: Secondary | ICD-10-CM | POA: Diagnosis not present

## 2022-03-05 DIAGNOSIS — E1165 Type 2 diabetes mellitus with hyperglycemia: Secondary | ICD-10-CM | POA: Diagnosis not present

## 2022-03-05 MED ORDER — ALBUTEROL SULFATE HFA 108 (90 BASE) MCG/ACT IN AERS
2.0000 | INHALATION_SPRAY | RESPIRATORY_TRACT | 1 refills | Status: DC | PRN
  Filled 2022-03-05: qty 6.7, 17d supply, fill #0

## 2022-03-05 MED ORDER — DAPAGLIFLOZIN PROPANEDIOL 10 MG PO TABS
10.0000 mg | ORAL_TABLET | Freq: Every day | ORAL | 2 refills | Status: DC
  Filled 2022-03-05: qty 30, 30d supply, fill #0
  Filled 2022-04-04: qty 30, 30d supply, fill #1
  Filled 2022-05-02: qty 30, 30d supply, fill #2
  Filled 2022-06-05: qty 30, 30d supply, fill #3
  Filled 2022-07-02: qty 30, 30d supply, fill #4
  Filled 2022-08-04: qty 30, 30d supply, fill #5

## 2022-03-05 MED ORDER — CARVEDILOL 25 MG PO TABS
25.0000 mg | ORAL_TABLET | Freq: Two times a day (BID) | ORAL | 2 refills | Status: DC
  Filled 2022-03-05: qty 180, 90d supply, fill #0
  Filled 2022-06-25: qty 180, 90d supply, fill #1
  Filled 2022-10-18: qty 180, 90d supply, fill #2

## 2022-03-05 MED ORDER — METFORMIN HCL ER 500 MG PO TB24
1000.0000 mg | ORAL_TABLET | Freq: Two times a day (BID) | ORAL | 2 refills | Status: DC
  Filled 2022-03-05: qty 360, 90d supply, fill #0

## 2022-03-05 MED ORDER — AMLODIPINE BESYLATE 5 MG PO TABS
5.0000 mg | ORAL_TABLET | Freq: Every day | ORAL | 2 refills | Status: DC
  Filled 2022-03-05: qty 90, 90d supply, fill #0

## 2022-03-05 MED ORDER — LISINOPRIL 20 MG PO TABS
20.0000 mg | ORAL_TABLET | Freq: Every day | ORAL | 2 refills | Status: DC
  Filled 2022-03-05: qty 90, 90d supply, fill #0

## 2022-03-05 MED ORDER — ATORVASTATIN CALCIUM 20 MG PO TABS
20.0000 mg | ORAL_TABLET | Freq: Every day | ORAL | 3 refills | Status: DC
  Filled 2022-03-05: qty 90, 90d supply, fill #0

## 2022-03-05 NOTE — Patient Instructions (Signed)
Keep the diet clean and stay active.  Go back on the Farxiga and insulin. Send me a message in 2 weeks with your sugar readings.   Check your blood pressures 2-3 times per week, alternating the time of day you check it. If it is high, considering waiting 1-2 minutes and rechecking. If it gets higher, your anxiety is likely creeping up and we should avoid rechecking.   If you do not hear anything about your referral in the next 1-2 weeks, call our office and ask for an update.

## 2022-03-05 NOTE — Progress Notes (Signed)
Chief Complaint  Patient presents with   Hospitalization Follow-up    Asthma Discuss Diabetes    Subjective: Patient is a 56 y.o. male here for f/u. Here w his wife.   Patient recently went to the emergency department for shortness of breath and wheezing.  He has a history of asthma and had run out of his inhaler.  He received an antibiotic and a refill of his albuterol and feels much better.  Patient has a history of type 2 diabetes.  He is historically noncompliant with his medication.  Currently he is only taking metformin.  He has not been taking his Farxiga or insulin 12 units nightly.  Diet is fair.  He does not exercise routinely.  He does not check his sugars either.  Patient has a history of hypertension.  He is normally compliant with his carvedilol 25 mg twice daily, amlodipine 5 mg daily, lisinopril 20 mg daily.  Historically, he was compliant with this and his blood pressure was controlled.  He does not monitor his blood pressure at home.  He has not been taking it since recovering from his ER visit.  No chest pain or shortness of breath.  Past Medical History:  Diagnosis Date   Asthma    Diabetes mellitus without complication (HCC)    History of chicken pox    History of DVT (deep vein thrombosis)    s/p trauma of R leg -- 2014. No other history of DVT   History of kidney stones    Hypertension    Lazy eye of left side     Objective: BP (!) 154/92 (BP Location: Left Arm, Cuff Size: Normal)   Pulse 91   Temp 98 F (36.7 C) (Oral)   Ht 5\' 10"  (1.778 m)   Wt 178 lb 4 oz (80.9 kg)   SpO2 97%   BMI 25.58 kg/m  General: Awake, appears stated age Heart: RRR, no LE edema Lungs: CTAB, no rales, wheezes or rhonchi. No accessory muscle use Psych: Age appropriate judgment and insight, normal affect and mood  Assessment and Plan: Type 2 diabetes mellitus with hyperglycemia, with long-term current use of insulin (HCC) - Plan: dapagliflozin propanediol (FARXIGA) 10 MG TABS  tablet, metFORMIN (GLUCOPHAGE-XR) 500 MG 24 hr tablet, atorvastatin (LIPITOR) 20 MG tablet, Ambulatory referral to Endocrinology  Essential hypertension - Plan: amLODipine (NORVASC) 5 MG tablet, lisinopril (ZESTRIL) 20 MG tablet  Chronic, uncontrolled.  We will place another referral to the endocrinology team, this time to [facility].  Counseled on diet and exercise.  Continue metformin 1000 mg twice daily, go back on Farxiga 10 mg daily, go back on insulin glargine 12 units nightly.  Send me a message with sugar readings in 2 or 3 weeks. Chronic, not technically controlled.  Recheck is still elevated but he is not taking his blood pressure medicine.  Needs go back on this as I think this regimen is appropriate for him currently. The patient and his wife voiced understanding and agreement to the plan.  03-29-2002 [place], DO 03/05/22  10:13 AM

## 2022-03-07 ENCOUNTER — Emergency Department (HOSPITAL_BASED_OUTPATIENT_CLINIC_OR_DEPARTMENT_OTHER): Payer: 59

## 2022-03-07 ENCOUNTER — Encounter (HOSPITAL_COMMUNITY): Payer: Self-pay

## 2022-03-07 ENCOUNTER — Other Ambulatory Visit: Payer: Self-pay

## 2022-03-07 ENCOUNTER — Emergency Department (HOSPITAL_BASED_OUTPATIENT_CLINIC_OR_DEPARTMENT_OTHER)
Admission: EM | Admit: 2022-03-07 | Discharge: 2022-03-07 | Disposition: A | Discharge status: Home / Self Care | Payer: 59 | Source: Home / Self Care | Attending: Emergency Medicine | Admitting: Emergency Medicine

## 2022-03-07 ENCOUNTER — Encounter (HOSPITAL_BASED_OUTPATIENT_CLINIC_OR_DEPARTMENT_OTHER): Payer: Self-pay | Admitting: Emergency Medicine

## 2022-03-07 ENCOUNTER — Emergency Department (HOSPITAL_COMMUNITY)
Admission: EM | Admit: 2022-03-07 | Discharge: 2022-03-07 | Discharge status: Against Medical Advice | Payer: 59 | Attending: Emergency Medicine | Admitting: Emergency Medicine

## 2022-03-07 DIAGNOSIS — Z7982 Long term (current) use of aspirin: Secondary | ICD-10-CM | POA: Insufficient documentation

## 2022-03-07 DIAGNOSIS — R112 Nausea with vomiting, unspecified: Secondary | ICD-10-CM

## 2022-03-07 DIAGNOSIS — Z7984 Long term (current) use of oral hypoglycemic drugs: Secondary | ICD-10-CM | POA: Insufficient documentation

## 2022-03-07 DIAGNOSIS — R739 Hyperglycemia, unspecified: Secondary | ICD-10-CM

## 2022-03-07 DIAGNOSIS — Z79899 Other long term (current) drug therapy: Secondary | ICD-10-CM | POA: Insufficient documentation

## 2022-03-07 DIAGNOSIS — E860 Dehydration: Secondary | ICD-10-CM | POA: Insufficient documentation

## 2022-03-07 DIAGNOSIS — E1165 Type 2 diabetes mellitus with hyperglycemia: Secondary | ICD-10-CM | POA: Insufficient documentation

## 2022-03-07 DIAGNOSIS — R1013 Epigastric pain: Secondary | ICD-10-CM | POA: Insufficient documentation

## 2022-03-07 DIAGNOSIS — Z794 Long term (current) use of insulin: Secondary | ICD-10-CM | POA: Insufficient documentation

## 2022-03-07 DIAGNOSIS — Z5321 Procedure and treatment not carried out due to patient leaving prior to being seen by health care provider: Secondary | ICD-10-CM | POA: Diagnosis not present

## 2022-03-07 DIAGNOSIS — I10 Essential (primary) hypertension: Secondary | ICD-10-CM | POA: Insufficient documentation

## 2022-03-07 LAB — CBC
HCT: 42.4 % (ref 39.0–52.0)
Hemoglobin: 14.1 g/dL (ref 13.0–17.0)
MCH: 30.9 pg (ref 26.0–34.0)
MCHC: 33.3 g/dL (ref 30.0–36.0)
MCV: 92.8 fL (ref 80.0–100.0)
Platelets: 212 10*3/uL (ref 150–400)
RBC: 4.57 MIL/uL (ref 4.22–5.81)
RDW: 12.7 % (ref 11.5–15.5)
WBC: 8.4 10*3/uL (ref 4.0–10.5)
nRBC: 0 % (ref 0.0–0.2)

## 2022-03-07 LAB — URINALYSIS, ROUTINE W REFLEX MICROSCOPIC
Bilirubin Urine: NEGATIVE
Glucose, UA: >1000 mg/dL — AB
Ketones, ur: 15 mg/dL — AB
Leukocytes,Ua: NEGATIVE
Nitrite: NEGATIVE
Protein, ur: 30 mg/dL — AB
Specific Gravity, Urine: 1.013 (ref 1.005–1.030)
pH: 6 (ref 5.0–8.0)

## 2022-03-07 LAB — CBC WITH DIFFERENTIAL/PLATELET
Abs Immature Granulocytes: 0.07 10*3/uL (ref 0.00–0.07)
Basophils Absolute: 0 10*3/uL (ref 0.0–0.1)
Basophils Relative: 0 %
Eosinophils Absolute: 0.1 10*3/uL (ref 0.0–0.5)
Eosinophils Relative: 1 %
HCT: 39.4 % (ref 39.0–52.0)
Hemoglobin: 13.4 g/dL (ref 13.0–17.0)
Immature Granulocytes: 1 %
Lymphocytes Relative: 22 %
Lymphs Abs: 2.2 10*3/uL (ref 0.7–4.0)
MCH: 31.7 pg (ref 26.0–34.0)
MCHC: 34 g/dL (ref 30.0–36.0)
MCV: 93.1 fL (ref 80.0–100.0)
Monocytes Absolute: 0.5 10*3/uL (ref 0.1–1.0)
Monocytes Relative: 5 %
Neutro Abs: 7.1 10*3/uL (ref 1.7–7.7)
Neutrophils Relative %: 71 %
Platelets: 248 10*3/uL (ref 150–400)
RBC: 4.23 MIL/uL (ref 4.22–5.81)
RDW: 12.5 % (ref 11.5–15.5)
WBC: 9.9 10*3/uL (ref 4.0–10.5)
nRBC: 0 % (ref 0.0–0.2)

## 2022-03-07 LAB — LIPASE, BLOOD
Lipase: 28 U/L (ref 11–51)
Lipase: 12 U/L (ref 11–51)

## 2022-03-07 LAB — COMPREHENSIVE METABOLIC PANEL
ALT: 22 U/L (ref 0–44)
ALT: 19 U/L (ref 0–44)
AST: 21 U/L (ref 15–41)
AST: 16 U/L (ref 15–41)
Albumin: 3.4 g/dL — ABNORMAL LOW (ref 3.5–5.0)
Albumin: 4.2 g/dL (ref 3.5–5.0)
Alkaline Phosphatase: 59 U/L (ref 38–126)
Alkaline Phosphatase: 57 U/L (ref 38–126)
Anion gap: 10 (ref 5–15)
Anion gap: 15 (ref 5–15)
BUN: 22 mg/dL — ABNORMAL HIGH (ref 6–20)
BUN: 23 mg/dL — ABNORMAL HIGH (ref 6–20)
CO2: 25 mmol/L (ref 22–32)
CO2: 24 mmol/L (ref 22–32)
Calcium: 9.4 mg/dL (ref 8.9–10.3)
Calcium: 9.8 mg/dL (ref 8.9–10.3)
Chloride: 106 mmol/L (ref 98–111)
Chloride: 103 mmol/L (ref 98–111)
Creatinine, Ser: 1.52 mg/dL — ABNORMAL HIGH (ref 0.61–1.24)
Creatinine, Ser: 1.25 mg/dL — ABNORMAL HIGH (ref 0.61–1.24)
GFR, Estimated: 54 mL/min — ABNORMAL LOW (ref >60)
GFR, Estimated: >60 mL/min (ref >60)
Glucose, Bld: 214 mg/dL — ABNORMAL HIGH (ref 70–99)
Glucose, Bld: 175 mg/dL — ABNORMAL HIGH (ref 70–99)
Potassium: 4.1 mmol/L (ref 3.5–5.1)
Potassium: 4.1 mmol/L (ref 3.5–5.1)
Sodium: 141 mmol/L (ref 135–145)
Sodium: 142 mmol/L (ref 135–145)
Total Bilirubin: 0.7 mg/dL (ref 0.3–1.2)
Total Bilirubin: 1.2 mg/dL (ref 0.3–1.2)
Total Protein: 7.4 g/dL (ref 6.5–8.1)
Total Protein: 7.8 g/dL (ref 6.5–8.1)

## 2022-03-07 LAB — RAPID URINE DRUG SCREEN, HOSP PERFORMED
Amphetamines: NOT DETECTED
Barbiturates: NOT DETECTED
Benzodiazepines: NOT DETECTED
Cocaine: NOT DETECTED
Opiates: NOT DETECTED
Tetrahydrocannabinol: NOT DETECTED

## 2022-03-07 LAB — CBG MONITORING, ED: Glucose-Capillary: 196 mg/dL — ABNORMAL HIGH (ref 70–99)

## 2022-03-07 MED ORDER — SODIUM CHLORIDE 0.9 % IV SOLN
12.5000 mg | Freq: Once | INTRAVENOUS | Status: AC
  Administered 2022-03-07: 12.5 mg via INTRAVENOUS
  Filled 2022-03-07: qty 0.5

## 2022-03-07 MED ORDER — IOHEXOL 300 MG/ML  SOLN
100.0000 mL | Freq: Once | INTRAMUSCULAR | Status: AC | PRN
  Administered 2022-03-07: 75 mL via INTRAVENOUS

## 2022-03-07 MED ORDER — CARVEDILOL 12.5 MG PO TABS
25.0000 mg | ORAL_TABLET | Freq: Once | ORAL | Status: AC
  Administered 2022-03-07: 25 mg via ORAL
  Filled 2022-03-07: qty 2

## 2022-03-07 MED ORDER — PROMETHAZINE HCL 25 MG/ML IJ SOLN
INTRAMUSCULAR | Status: AC
  Filled 2022-03-07: qty 1

## 2022-03-07 MED ORDER — AMLODIPINE BESYLATE 5 MG PO TABS
5.0000 mg | ORAL_TABLET | Freq: Once | ORAL | Status: AC
  Administered 2022-03-07: 5 mg via ORAL
  Filled 2022-03-07: qty 1

## 2022-03-07 MED ORDER — LISINOPRIL 10 MG PO TABS
20.0000 mg | ORAL_TABLET | Freq: Once | ORAL | Status: AC
  Administered 2022-03-07: 20 mg via ORAL
  Filled 2022-03-07: qty 2

## 2022-03-07 MED ORDER — LACTATED RINGERS IV BOLUS
500.0000 mL | Freq: Once | INTRAVENOUS | Status: AC
  Administered 2022-03-07: 500 mL via INTRAVENOUS

## 2022-03-07 MED ORDER — PROMETHAZINE HCL 25 MG PO TABS
25.0000 mg | ORAL_TABLET | Freq: Three times a day (TID) | ORAL | 0 refills | Status: DC | PRN
  Filled 2022-03-07: qty 30, 10d supply, fill #0

## 2022-03-07 MED ORDER — METOCLOPRAMIDE HCL 5 MG/ML IJ SOLN
10.0000 mg | Freq: Once | INTRAMUSCULAR | Status: AC
  Administered 2022-03-07: 10 mg via INTRAVENOUS
  Filled 2022-03-07: qty 2

## 2022-03-07 MED ORDER — PROCHLORPERAZINE MALEATE 10 MG PO TABS
10.0000 mg | ORAL_TABLET | Freq: Once | ORAL | Status: AC
  Administered 2022-03-07: 10 mg via ORAL
  Filled 2022-03-07 (×2): qty 1

## 2022-03-07 MED ORDER — SODIUM CHLORIDE 0.9 % IV BOLUS
1000.0000 mL | Freq: Once | INTRAVENOUS | Status: AC
  Administered 2022-03-07: 1000 mL via INTRAVENOUS

## 2022-03-07 MED ORDER — PROCHLORPERAZINE EDISYLATE 10 MG/2ML IJ SOLN
10.0000 mg | Freq: Once | INTRAMUSCULAR | Status: AC
  Administered 2022-03-07: 10 mg via INTRAVENOUS
  Filled 2022-03-07: qty 2

## 2022-03-07 NOTE — ED Notes (Signed)
Patient transported to CT 

## 2022-03-07 NOTE — ED Provider Triage Note (Signed)
Emergency Medicine Provider Triage Evaluation Note  John [name] , a 56 y.o. male  was evaluated in triage.  Pt complains of nausea vomiting started today no associated stomach pain no fevers no chills no other complaints..  Review of Systems  Positive: Nausea vomiting Negative: Chest pain, shortness of breath  Physical Exam  BP (!) 160/95   Pulse 74   Temp 97.6 F (36.4 C) (Oral)   Resp 16   Ht 5\' 10"  (1.778 m)   Wt 80.7 kg   SpO2 100%   BMI 25.54 kg/m  Gen:   Awake, no distress   Resp:  Normal effort  MSK:   Moves extremities without difficulty  Other:    Medical Decision Making  Medically screening exam initiated at 5:50 AM.  Appropriate orders placed.  John [name] was informed that the remainder of the evaluation will be completed by another provider, this initial triage assessment does not replace that evaluation, and the importance of remaining in the ED until their evaluation is complete.  Lab work has been ordered will need further workup.   John Leigh, PA-C 03/07/22 [phone]

## 2022-03-07 NOTE — ED Notes (Signed)
DC papers reviewed. No questions or concerns. No signs of distress. Pt assisted to wheelchair and out to lobby. Appropriate measures for safety taken. 

## 2022-03-07 NOTE — ED Triage Notes (Addendum)
Pt arrives to ED with c/o nausea, vomiting that started yesterday. LWBS at [facility] ED, has labs drawn. He reports re starting his Metformin over the weekend after not taking it for x1 month.

## 2022-03-07 NOTE — ED Provider Notes (Signed)
Care assumed from Dr. Denton Lank.  Diabetic here with upper abdominal pain and nausea vomiting.  No black or bloody stools.  No diarrhea or fever.  Awaiting p.o. challenge and CT abdomen and pelvis.  Labs reassuring.  Anion gap is normal.  Patient given IV fluids and antiemetics.  CT scan shows some evidence of enteritis and thickening of the jejunum with trace free fluid.  IMPRESSION:  Wall thickening of proximal jejunum is noted concerning for  enteritis. Minimal amount of free fluid is noted in the right  pericolic gutter and posterior pelvis which may be secondary to  inflammation.    Abdomen is soft and nontender.  Patient able to tolerate p.o.  Patient given home blood pressure medication by his request.  Has not had any medications today.  Follow-up with PCP closely return precautions discussed.    Glynn Octave, MD 03/07/22 1733

## 2022-03-07 NOTE — ED Triage Notes (Signed)
Patient states that he was at work this morning and started having nausea and then had 4 episodes of vomiting. He states that he did have a little bit of diarrhea as well. Denies eating anything out of the ordinary today. Hx of diabetes, HTN.

## 2022-03-07 NOTE — ED Notes (Signed)
Patient came to this tech and stated he will be going over to [facility] to be seen.

## 2022-03-07 NOTE — Discharge Instructions (Addendum)
It was our pleasure to provide your ER care today - we hope that you feel better.  Drink plenty of fluids/stay well hydrated.   Follow up closely with your doctor in the coming week for recent symptoms, as well as recheck of blood pressure and blood sugar which have been high.  Your CT scan did show some thickening of your intestine called the jejunum which is likely due to a viral infection.  Keep yourself hydrated and take the nausea medication as prescribed.  Return to ER right away if worse, new symptoms, fevers, new or worsening or severe abdominal pain, persistent vomiting, chest pain, trouble breathing, or other emergency concern.

## 2022-03-07 NOTE — ED Provider Notes (Signed)
[facility] DEPT Provider Note   CSN: [id number] Arrival date & time: 03/07/22  1003     History  Chief Complaint  Patient presents with   Nausea   Emesis    John [name] is a 56 y.o. male.  Pt with nausea, vomiting and abd discomfort. Symptoms acute onset in past 1-2 days, epigastric, mild discomfort, not radiating, with nausea/vomiting. Emesis not bloody or bilious. Has been having normal bms. Recent ED eval w uri symptoms/cough - indicates that is much improved from prior. Also indicates recently restarted in his bp meds and diabetes meds. Denies fever. No chest pain or discomfort. No sob. No leg pain or swelling. No dysuria or gu c/o.   The history is provided by the patient, medical records and a significant other.  Emesis Associated symptoms: abdominal pain   Associated symptoms: no chills, no cough, no diarrhea, no fever, no headaches and no sore throat        Home Medications Prior to Admission medications   Medication Sig Start Date End Date Taking? Authorizing Provider  albuterol (VENTOLIN HFA) 108 (90 Base) MCG/ACT inhaler Inhale 2 puffs into the lungs every 4 (four) hours as needed for wheezing or shortness of breath. 03/05/22   [name], DO  amLODipine (NORVASC) 5 MG tablet Take 1 tablet (5 mg total) by mouth daily. 03/05/22   [name], DO  aspirin 81 MG chewable tablet Chew 81 mg by mouth daily.    [provider]  atorvastatin (LIPITOR) 20 MG tablet Take 1 tablet (20 mg total) by mouth daily. 03/05/22   [name], DO  azithromycin (ZITHROMAX Z-PAK) 250 MG tablet Take 1 tablet (250 mg total) by mouth daily for 4 days. 03/03/22 03/07/22  Lajean Saver, MD  blood glucose meter kit and supplies KIT Dispense based on patient and insurance preference. Use up to four times daily as directed. (FOR ICD-9 250.00, 250.01). 05/19/21   Regalado, Belkys A, MD  carvedilol (COREG) 25 MG tablet Take 1  tablet (25 mg total) by mouth 2 (two) times daily with a meal. 03/05/22   Wendling, [name], DO  dapagliflozin propanediol (FARXIGA) 10 MG TABS tablet Take 1 tablet (10 mg total) by mouth daily before breakfast. 03/05/22   Wendling, [name], DO  gentamicin cream (GARAMYCIN) 0.1 % Apply 1 Application topically 2 (two) times daily. 02/04/22   [name], DPM  glucose blood test strip Use as instructed, Check blood sugars twice daily 05/19/21   Regalado, Belkys A, MD  insulin glargine-yfgn (SEMGLEE, YFGN,) 100 UNIT/ML Pen Inject 12 Units into the skin at bedtime. 10/19/21   [name], DO  Insulin Pen Needle (NOVOFINE PLUS PEN NEEDLE) 32G X 4 MM MISC Use daily to insulin 05/19/21   Regalado, Belkys A, MD  Lancets (ONETOUCH ULTRASOFT) lancets Use as instructed, Check blood sugars twice daily 05/19/21   Regalado, Belkys A, MD  levocetirizine (XYZAL) 5 MG tablet Take 1 tablet (5 mg total) by mouth every evening. Patient taking differently: Take 5 mg by mouth daily as needed for allergies. 12/01/19   [name], DO  lisinopril (ZESTRIL) 20 MG tablet Take 1 tablet (20 mg total) by mouth daily. 03/05/22   [name], DO  metFORMIN (GLUCOPHAGE-XR) 500 MG 24 hr tablet Take 2 tablets (1,000 mg total) by mouth in the morning and at bedtime. 03/05/22   [name], DO      Allergies    Zofran Alvis Lemmings  hcl]    Review of Systems   Review of Systems  Constitutional:  Negative for chills and fever.  HENT:  Negative for sore throat.   Eyes:  Negative for redness.  Respiratory:  Negative for cough and shortness of breath.   Cardiovascular:  Negative for chest pain.  Gastrointestinal:  Positive for abdominal pain, nausea and vomiting. Negative for constipation and diarrhea.  Genitourinary:  Negative for dysuria and flank pain.  Musculoskeletal:  Negative for back pain and neck pain.  Skin:  Negative for rash.  Neurological:  Negative for  headaches.  Hematological:  Does not bruise/bleed easily.  Psychiatric/Behavioral:  Negative for confusion.     Physical Exam Updated Vital Signs BP (!) 173/100 (BP Location: Right Arm)   Pulse 76   Temp 97.7 F (36.5 C) (Temporal)   Resp 14   SpO2 100%  Physical Exam Vitals and nursing note reviewed.  Constitutional:      Appearance: Normal appearance. He is well-developed.  HENT:     Head: Atraumatic.     Nose: Nose normal.     Mouth/Throat:     Mouth: Mucous membranes are moist.     Pharynx: Oropharynx is clear.  Eyes:     General: No scleral icterus.    Conjunctiva/sclera: Conjunctivae normal.     Pupils: Pupils are equal, round, and reactive to light.  Neck:     Trachea: No tracheal deviation.  Cardiovascular:     Rate and Rhythm: Normal rate and regular rhythm.     Pulses: Normal pulses.     Heart sounds: Normal heart sounds. No murmur heard.    No friction rub. No gallop.  Pulmonary:     Effort: Pulmonary effort is normal. No accessory muscle usage or respiratory distress.     Breath sounds: Normal breath sounds.  Abdominal:     General: Bowel sounds are normal. There is no distension.     Palpations: Abdomen is soft. There is no mass.     Tenderness: There is no abdominal tenderness. There is no guarding.  Genitourinary:    Comments: No cva tenderness. Musculoskeletal:        General: No swelling or tenderness.     Cervical back: Normal range of motion and neck supple. No rigidity.     Right lower leg: No edema.     Left lower leg: No edema.  Skin:    General: Skin is warm and dry.     Findings: No rash.  Neurological:     Mental Status: He is alert.     Comments: Alert, speech clear.   Psychiatric:        Mood and Affect: Mood normal.     ED Results / Procedures / Treatments   Labs (all labs ordered are listed, but only abnormal results are displayed) Results for orders placed or performed during the hospital encounter of 03/07/22  CBC  Result  Value Ref Range   WBC 8.4 4.0 - 10.5 K/uL   RBC 4.57 4.22 - 5.81 MIL/uL   Hemoglobin 14.1 13.0 - 17.0 g/dL   HCT 42.4 39.0 - 52.0 %   MCV 92.8 80.0 - 100.0 fL   MCH 30.9 26.0 - 34.0 pg   MCHC 33.3 30.0 - 36.0 g/dL   RDW 12.7 11.5 - 15.5 %   Platelets 212 150 - 400 K/uL   nRBC 0.0 0.0 - 0.2 %  Comprehensive metabolic panel  Result Value Ref Range   Sodium 142 135 -  145 mmol/L   Potassium 4.1 3.5 - 5.1 mmol/L   Chloride 103 98 - 111 mmol/L   CO2 24 22 - 32 mmol/L   Glucose, Bld 175 (H) 70 - 99 mg/dL   BUN 23 (H) 6 - 20 mg/dL   Creatinine, Ser 1.25 (H) 0.61 - 1.24 mg/dL   Calcium 9.8 8.9 - 10.3 mg/dL   Total Protein 7.8 6.5 - 8.1 g/dL   Albumin 4.2 3.5 - 5.0 g/dL   AST 16 15 - 41 U/L   ALT 19 0 - 44 U/L   Alkaline Phosphatase 57 38 - 126 U/L   Total Bilirubin 1.2 0.3 - 1.2 mg/dL   GFR, Estimated >60 >60 mL/min   Anion gap 15 5 - 15  Lipase, blood  Result Value Ref Range   Lipase 12 11 - 51 U/L   CT Angio Chest PE W/Cm &/Or Wo Cm  Result Date: 03/03/2022 CLINICAL DATA:  Shortness of breath for 2 weeks with cough and difficulty breathing since developing a cold. EXAM: CT ANGIOGRAPHY CHEST WITH CONTRAST TECHNIQUE: Multidetector CT imaging of the chest was performed using the standard protocol during bolus administration of intravenous contrast. Multiplanar CT image reconstructions and MIPs were obtained to evaluate the vascular anatomy. RADIATION DOSE REDUCTION: This exam was performed according to the departmental dose-optimization program which includes automated exposure control, adjustment of the mA and/or kV according to patient size and/or use of iterative reconstruction technique. CONTRAST:  57m OMNIPAQUE IOHEXOL 350 MG/ML SOLN COMPARISON:  Chest x-ray from earlier today FINDINGS: Cardiovascular: Satisfactory opacification of the pulmonary arteries to the segmental level. No evidence of acute pulmonary embolism. Left lower lobe posterior basilar segmental pulmonary artery is  less enhancing and collapse compared to other similar sized vessels. Generous heart size. No pericardial effusion. Mediastinum/Nodes: No worrisome lymph node. Lungs/Pleura: Patchy ground-glass opacity in the bilateral lungs, history favoring atypical infection although there is some interlobular septal thickening at the bases with generalized airway cuffing. Trace pleural effusions. Upper Abdomen: No acute finding Musculoskeletal: No acute or aggressive finding Review of the MIP images confirms the above findings. IMPRESSION: 1. Patchy ground-glass opacity in the bilateral lungs, concerning for atypical pneumonia given the history but there is interstitial edema and trace pleural effusions. Early alveolar edema is also possible. 2. No acute pulmonary embolism. There is a nonenhancing subsegmental vessel in left lower lobe which is collapsed, possibly remote embolic disease. Electronically Signed   By: JJorje GuildM.D.   On: 03/03/2022 05:00   DG Chest 2 View  Result Date: 03/03/2022 CLINICAL DATA:  Dyspnea, cough EXAM: CHEST - 2 VIEW COMPARISON:  05/15/2021 FINDINGS: The lungs are symmetrically well expanded. Interval development of moderate bilateral perihilar interstitial pulmonary infiltrate, infection versus edema. No pneumothorax or pleural effusion. Cardiac size within normal limits. IMPRESSION: 1. Interval development of moderate bilateral perihilar interstitial pulmonary infiltrate, infection versus edema. Electronically Signed   By: AFidela SalisburyM.D.   On: 03/03/2022 02:51    EKG EKG Interpretation  Date/Time:  Thursday March 07 2022 15:05:47 EST Ventricular Rate:  90 PR Interval:  153 QRS Duration: 88 QT Interval:  402 QTC Calculation: 492 R Axis:   54 Text Interpretation: Sinus rhythm Left ventricular hypertrophy Borderline prolonged QT interval Nonspecific T wave abnormality Confirmed by SLajean Saver([id number] on 03/07/2022 3:19:41 PM  Radiology No results  found.  Procedures Procedures    Medications Ordered in ED Medications  sodium chloride 0.9 % bolus 1,000 mL (has no administration in  time range)  metoCLOPramide (REGLAN) injection 10 mg (has no administration in time range)    ED Course/ Medical Decision Making/ A&P                           Medical Decision Making Problems Addressed: Dehydration: acute illness or injury with systemic symptoms that poses a threat to life or bodily functions Essential hypertension: chronic illness or injury with exacerbation, progression, or side effects of treatment that poses a threat to life or bodily functions Hyperglycemia: acute illness or injury Nausea and vomiting in adult: acute illness or injury with systemic symptoms that poses a threat to life or bodily functions Uncontrolled hypertension: chronic illness or injury with exacerbation, progression, or side effects of treatment that poses a threat to life or bodily functions  Amount and/or Complexity of Data Reviewed External Data Reviewed: notes. Labs: ordered. Decision-making details documented in ED Course. Radiology: independent interpretation performed. Decision-making details documented in ED Course. ECG/medicine tests: ordered and independent interpretation performed. Decision-making details documented in ED Course.  Risk Prescription drug management. Decision regarding hospitalization.   Iv ns. Continuous pulse ox and cardiac monitoring. Labs ordered/sent.   Diff dx includes viral gastroenteritis, med side effect, dehydration, aki, etc - dispo decision including potential need for admission considered - will get labs, give tx, and reassess.   Reviewed nursing notes and prior charts for additional history. External reports reviewed. Additional history from: so, hx  Cardiac monitor: sinus rhythm, rate 76.  Ns bolus. Reglan iv.   Labs reviewed/interpreted by me - wbc and hgb normal. K normal.   Recent CT imaging  reviewed/interpreted by me - no pe.  Patient with persistent nausea. Zofran. Ivf. Pt denies any chest pain or discomfort. Denies abd pain, no abd tenderness.   Pt indicates he has adequate of his meds, incl for dm and htn already.   Ivf, ua, po trial pending - 1525, signed out to Dr Wyvonnia Dusky to check pending lab and recheck after ivf, dispo appropriately.                Final Clinical Impression(s) / ED Diagnoses Final diagnoses:  None    Rx / DC Orders ED Discharge Orders     None         Lajean Saver, MD 03/07/22 1525

## 2022-03-07 NOTE — ED Notes (Signed)
Pt given a ginger ale, will reassess

## 2022-03-08 ENCOUNTER — Other Ambulatory Visit (HOSPITAL_COMMUNITY): Payer: Self-pay

## 2022-03-09 ENCOUNTER — Encounter (HOSPITAL_COMMUNITY): Payer: Self-pay

## 2022-03-09 ENCOUNTER — Encounter (HOSPITAL_BASED_OUTPATIENT_CLINIC_OR_DEPARTMENT_OTHER): Payer: Self-pay

## 2022-03-09 ENCOUNTER — Other Ambulatory Visit: Payer: Self-pay

## 2022-03-09 ENCOUNTER — Inpatient Hospital Stay (HOSPITAL_BASED_OUTPATIENT_CLINIC_OR_DEPARTMENT_OTHER)
Admission: EM | Admit: 2022-03-09 | Discharge: 2022-03-12 | DRG: 286 | Disposition: A | Discharge status: Home / Self Care | Payer: 59 | Attending: Family Medicine | Admitting: Family Medicine

## 2022-03-09 DIAGNOSIS — I2489 Other forms of acute ischemic heart disease: Secondary | ICD-10-CM | POA: Diagnosis present

## 2022-03-09 DIAGNOSIS — I43 Cardiomyopathy in diseases classified elsewhere: Secondary | ICD-10-CM | POA: Diagnosis not present

## 2022-03-09 DIAGNOSIS — E860 Dehydration: Secondary | ICD-10-CM | POA: Diagnosis not present

## 2022-03-09 DIAGNOSIS — Z86718 Personal history of other venous thrombosis and embolism: Secondary | ICD-10-CM

## 2022-03-09 DIAGNOSIS — E1169 Type 2 diabetes mellitus with other specified complication: Secondary | ICD-10-CM

## 2022-03-09 DIAGNOSIS — Z7984 Long term (current) use of oral hypoglycemic drugs: Secondary | ICD-10-CM

## 2022-03-09 DIAGNOSIS — J4520 Mild intermittent asthma, uncomplicated: Secondary | ICD-10-CM | POA: Diagnosis not present

## 2022-03-09 DIAGNOSIS — Z79899 Other long term (current) drug therapy: Secondary | ICD-10-CM

## 2022-03-09 DIAGNOSIS — E119 Type 2 diabetes mellitus without complications: Secondary | ICD-10-CM | POA: Diagnosis not present

## 2022-03-09 DIAGNOSIS — T465X6A Underdosing of other antihypertensive drugs, initial encounter: Secondary | ICD-10-CM | POA: Diagnosis present

## 2022-03-09 DIAGNOSIS — N179 Acute kidney failure, unspecified: Secondary | ICD-10-CM | POA: Diagnosis present

## 2022-03-09 DIAGNOSIS — I5021 Acute systolic (congestive) heart failure: Secondary | ICD-10-CM | POA: Diagnosis not present

## 2022-03-09 DIAGNOSIS — I110 Hypertensive heart disease with heart failure: Principal | ICD-10-CM | POA: Diagnosis present

## 2022-03-09 DIAGNOSIS — I2510 Atherosclerotic heart disease of native coronary artery without angina pectoris: Secondary | ICD-10-CM | POA: Diagnosis present

## 2022-03-09 DIAGNOSIS — Z20822 Contact with and (suspected) exposure to covid-19: Secondary | ICD-10-CM | POA: Diagnosis present

## 2022-03-09 DIAGNOSIS — Z823 Family history of stroke: Secondary | ICD-10-CM

## 2022-03-09 DIAGNOSIS — E1165 Type 2 diabetes mellitus with hyperglycemia: Secondary | ICD-10-CM | POA: Diagnosis not present

## 2022-03-09 DIAGNOSIS — Z7982 Long term (current) use of aspirin: Secondary | ICD-10-CM | POA: Diagnosis not present

## 2022-03-09 DIAGNOSIS — R0602 Shortness of breath: Secondary | ICD-10-CM | POA: Diagnosis not present

## 2022-03-09 DIAGNOSIS — E785 Hyperlipidemia, unspecified: Secondary | ICD-10-CM | POA: Diagnosis present

## 2022-03-09 DIAGNOSIS — R7989 Other specified abnormal findings of blood chemistry: Secondary | ICD-10-CM

## 2022-03-09 DIAGNOSIS — I509 Heart failure, unspecified: Secondary | ICD-10-CM

## 2022-03-09 DIAGNOSIS — Z794 Long term (current) use of insulin: Secondary | ICD-10-CM | POA: Diagnosis not present

## 2022-03-09 DIAGNOSIS — Z833 Family history of diabetes mellitus: Secondary | ICD-10-CM | POA: Diagnosis not present

## 2022-03-09 DIAGNOSIS — Z91148 Patient's other noncompliance with medication regimen for other reason: Secondary | ICD-10-CM

## 2022-03-09 DIAGNOSIS — R1010 Upper abdominal pain, unspecified: Secondary | ICD-10-CM

## 2022-03-09 DIAGNOSIS — K529 Noninfective gastroenteritis and colitis, unspecified: Secondary | ICD-10-CM | POA: Diagnosis not present

## 2022-03-09 DIAGNOSIS — Z8249 Family history of ischemic heart disease and other diseases of the circulatory system: Secondary | ICD-10-CM | POA: Diagnosis not present

## 2022-03-09 DIAGNOSIS — R112 Nausea with vomiting, unspecified: Principal | ICD-10-CM

## 2022-03-09 DIAGNOSIS — I10 Essential (primary) hypertension: Secondary | ICD-10-CM | POA: Diagnosis present

## 2022-03-09 LAB — URINALYSIS, ROUTINE W REFLEX MICROSCOPIC
Bilirubin Urine: NEGATIVE
Glucose, UA: >1000 mg/dL — AB
Ketones, ur: 15 mg/dL — AB
Leukocytes,Ua: NEGATIVE
Nitrite: NEGATIVE
Protein, ur: 100 mg/dL — AB
Specific Gravity, Urine: 1.024 (ref 1.005–1.030)
pH: 5.5 (ref 5.0–8.0)

## 2022-03-09 LAB — COMPREHENSIVE METABOLIC PANEL
ALT: 22 U/L (ref 0–44)
AST: 19 U/L (ref 15–41)
Albumin: 4.4 g/dL (ref 3.5–5.0)
Alkaline Phosphatase: 65 U/L (ref 38–126)
Anion gap: 13 (ref 5–15)
BUN: 24 mg/dL — ABNORMAL HIGH (ref 6–20)
CO2: 25 mmol/L (ref 22–32)
Calcium: 9.8 mg/dL (ref 8.9–10.3)
Chloride: 98 mmol/L (ref 98–111)
Creatinine, Ser: 1.2 mg/dL (ref 0.61–1.24)
GFR, Estimated: >60 mL/min (ref >60)
Glucose, Bld: 193 mg/dL — ABNORMAL HIGH (ref 70–99)
Potassium: 3.8 mmol/L (ref 3.5–5.1)
Sodium: 136 mmol/L (ref 135–145)
Total Bilirubin: 1.4 mg/dL — ABNORMAL HIGH (ref 0.3–1.2)
Total Protein: 8.5 g/dL — ABNORMAL HIGH (ref 6.5–8.1)

## 2022-03-09 LAB — CBC
HCT: 47.3 % (ref 39.0–52.0)
Hemoglobin: 16 g/dL (ref 13.0–17.0)
MCH: 30.8 pg (ref 26.0–34.0)
MCHC: 33.8 g/dL (ref 30.0–36.0)
MCV: 91 fL (ref 80.0–100.0)
Platelets: 279 10*3/uL (ref 150–400)
RBC: 5.2 MIL/uL (ref 4.22–5.81)
RDW: 12.4 % (ref 11.5–15.5)
WBC: 11.3 10*3/uL — ABNORMAL HIGH (ref 4.0–10.5)
nRBC: 0 % (ref 0.0–0.2)

## 2022-03-09 LAB — BRAIN NATRIURETIC PEPTIDE: B Natriuretic Peptide: 318.2 pg/mL — ABNORMAL HIGH (ref 0.0–100.0)

## 2022-03-09 LAB — MAGNESIUM: Magnesium: 2.1 mg/dL (ref 1.7–2.4)

## 2022-03-09 LAB — LACTIC ACID, PLASMA
Lactic Acid, Venous: 1.3 mmol/L (ref 0.5–1.9)
Lactic Acid, Venous: 1.4 mmol/L (ref 0.5–1.9)

## 2022-03-09 LAB — TROPONIN I (HIGH SENSITIVITY)
Troponin I (High Sensitivity): 183 ng/L (ref <18)
Troponin I (High Sensitivity): 201 ng/L (ref <18)
Troponin I (High Sensitivity): 192 ng/L (ref <18)
Troponin I (High Sensitivity): 211 ng/L (ref <18)

## 2022-03-09 LAB — RESP PANEL BY RT-PCR (RSV, FLU A&B, COVID)  RVPGX2
Influenza A by PCR: NEGATIVE
Influenza B by PCR: NEGATIVE
Resp Syncytial Virus by PCR: NEGATIVE
SARS Coronavirus 2 by RT PCR: NEGATIVE

## 2022-03-09 LAB — PHOSPHORUS: Phosphorus: 3.5 mg/dL (ref 2.5–4.6)

## 2022-03-09 LAB — CK: Total CK: 225 U/L (ref 49–397)

## 2022-03-09 LAB — LIPASE, BLOOD: Lipase: <10 U/L — ABNORMAL LOW (ref 11–51)

## 2022-03-09 LAB — CBG MONITORING, ED: Glucose-Capillary: 188 mg/dL — ABNORMAL HIGH (ref 70–99)

## 2022-03-09 LAB — GLUCOSE, CAPILLARY: Glucose-Capillary: 314 mg/dL — ABNORMAL HIGH (ref 70–99)

## 2022-03-09 MED ORDER — SODIUM CHLORIDE 0.9% FLUSH
3.0000 mL | Freq: Two times a day (BID) | INTRAVENOUS | Status: DC
  Administered 2022-03-10 (×2): 3 mL via INTRAVENOUS

## 2022-03-09 MED ORDER — MORPHINE SULFATE (PF) 2 MG/ML IV SOLN: 2.0000 mg | INTRAVENOUS | Status: DC | PRN

## 2022-03-09 MED ORDER — LACTATED RINGERS IV BOLUS
1000.0000 mL | Freq: Once | INTRAVENOUS | Status: AC
  Administered 2022-03-09: 1000 mL via INTRAVENOUS

## 2022-03-09 MED ORDER — AMLODIPINE BESYLATE 5 MG PO TABS
5.0000 mg | ORAL_TABLET | Freq: Every day | ORAL | Status: DC
  Administered 2022-03-09 – 2022-03-10 (×2): 5 mg via ORAL
  Filled 2022-03-09 (×2): qty 1

## 2022-03-09 MED ORDER — CARVEDILOL 25 MG PO TABS
25.0000 mg | ORAL_TABLET | Freq: Two times a day (BID) | ORAL | Status: DC
  Administered 2022-03-10 – 2022-03-12 (×5): 25 mg via ORAL
  Filled 2022-03-09 (×5): qty 1

## 2022-03-09 MED ORDER — INSULIN GLARGINE-YFGN 100 UNIT/ML ~~LOC~~ SOLN
12.0000 [IU] | Freq: Every day | SUBCUTANEOUS | Status: DC
  Administered 2022-03-09 – 2022-03-11 (×3): 12 [IU] via SUBCUTANEOUS
  Filled 2022-03-09 (×6): qty 0.12

## 2022-03-09 MED ORDER — INSULIN ASPART 100 UNIT/ML IJ SOLN
0.0000 [IU] | Freq: Three times a day (TID) | INTRAMUSCULAR | Status: DC
  Administered 2022-03-10: 5 [IU] via SUBCUTANEOUS
  Administered 2022-03-10 – 2022-03-11 (×2): 3 [IU] via SUBCUTANEOUS
  Administered 2022-03-12: 2 [IU] via SUBCUTANEOUS
  Administered 2022-03-12: 3 [IU] via SUBCUTANEOUS

## 2022-03-09 MED ORDER — ONDANSETRON HCL 4 MG PO TABS: 4.0000 mg | ORAL_TABLET | Freq: Four times a day (QID) | ORAL | Status: DC | PRN

## 2022-03-09 MED ORDER — PANTOPRAZOLE SODIUM 40 MG IV SOLR
40.0000 mg | Freq: Once | INTRAVENOUS | Status: AC
  Administered 2022-03-09: 40 mg via INTRAVENOUS
  Filled 2022-03-09: qty 10

## 2022-03-09 MED ORDER — SODIUM CHLORIDE 0.9 % IV BOLUS
1000.0000 mL | Freq: Once | INTRAVENOUS | Status: AC
  Administered 2022-03-09: 1000 mL via INTRAVENOUS

## 2022-03-09 MED ORDER — ACETAMINOPHEN 650 MG RE SUPP: 650.0000 mg | Freq: Four times a day (QID) | RECTAL | Status: DC | PRN

## 2022-03-09 MED ORDER — HYDROMORPHONE HCL 1 MG/ML IJ SOLN
0.5000 mg | Freq: Once | INTRAMUSCULAR | Status: AC
  Administered 2022-03-09: 0.5 mg via INTRAVENOUS
  Filled 2022-03-09: qty 1

## 2022-03-09 MED ORDER — LISINOPRIL 20 MG PO TABS
20.0000 mg | ORAL_TABLET | Freq: Every day | ORAL | Status: DC
  Administered 2022-03-10: 20 mg via ORAL
  Filled 2022-03-09: qty 1

## 2022-03-09 MED ORDER — ACETAMINOPHEN 325 MG PO TABS: 650.0000 mg | ORAL_TABLET | Freq: Four times a day (QID) | ORAL | Status: DC | PRN

## 2022-03-09 MED ORDER — INSULIN ASPART 100 UNIT/ML IJ SOLN
0.0000 [IU] | Freq: Every day | INTRAMUSCULAR | Status: DC
  Administered 2022-03-09: 4 [IU] via SUBCUTANEOUS

## 2022-03-09 MED ORDER — OXYCODONE HCL 5 MG PO TABS: 5.0000 mg | ORAL_TABLET | ORAL | Status: DC | PRN

## 2022-03-09 MED ORDER — ONDANSETRON HCL 4 MG/2ML IJ SOLN: 4.0000 mg | Freq: Four times a day (QID) | INTRAMUSCULAR | Status: DC | PRN

## 2022-03-09 MED ORDER — HYDRALAZINE HCL 20 MG/ML IJ SOLN: 5.0000 mg | INTRAMUSCULAR | Status: DC | PRN

## 2022-03-09 MED ORDER — ALBUTEROL SULFATE (2.5 MG/3ML) 0.083% IN NEBU: 2.5000 mg | INHALATION_SOLUTION | RESPIRATORY_TRACT | Status: DC | PRN

## 2022-03-09 MED ORDER — ATORVASTATIN CALCIUM 10 MG PO TABS
20.0000 mg | ORAL_TABLET | Freq: Every day | ORAL | Status: DC
  Administered 2022-03-09 – 2022-03-10 (×2): 20 mg via ORAL
  Filled 2022-03-09 (×2): qty 2

## 2022-03-09 MED ORDER — LACTATED RINGERS IV BOLUS: 2000.0000 mL | Freq: Once | INTRAVENOUS | Status: DC

## 2022-03-09 MED ORDER — ENOXAPARIN SODIUM 40 MG/0.4ML IJ SOSY
40.0000 mg | PREFILLED_SYRINGE | INTRAMUSCULAR | Status: DC
  Administered 2022-03-09 – 2022-03-10 (×2): 40 mg via SUBCUTANEOUS
  Filled 2022-03-09 (×2): qty 0.4

## 2022-03-09 MED ORDER — LABETALOL HCL 5 MG/ML IV SOLN
10.0000 mg | Freq: Once | INTRAVENOUS | Status: AC
  Administered 2022-03-09: 10 mg via INTRAVENOUS
  Filled 2022-03-09: qty 4

## 2022-03-09 MED ORDER — HYDROMORPHONE HCL 1 MG/ML IJ SOLN
1.0000 mg | Freq: Once | INTRAMUSCULAR | Status: AC
  Administered 2022-03-09: 1 mg via INTRAVENOUS
  Filled 2022-03-09: qty 1

## 2022-03-09 MED ORDER — DIPHENHYDRAMINE HCL 50 MG/ML IJ SOLN
25.0000 mg | Freq: Once | INTRAMUSCULAR | Status: AC
  Administered 2022-03-09: 25 mg via INTRAVENOUS
  Filled 2022-03-09: qty 1

## 2022-03-09 MED ORDER — LACTATED RINGERS IV SOLN: INTRAVENOUS | Status: DC

## 2022-03-09 MED ORDER — METOCLOPRAMIDE HCL 5 MG/ML IJ SOLN
10.0000 mg | Freq: Once | INTRAMUSCULAR | Status: AC
  Administered 2022-03-09: 10 mg via INTRAVENOUS
  Filled 2022-03-09: qty 2

## 2022-03-09 NOTE — ED Provider Notes (Signed)
Patient is diabetic.  Has been having difficulty for several weeks at this point with nausea and vomiting and abdominal pain.  Was first started with respiratory illness that the patient thought was an asthma exacerbation.  Patient reports that he had been noncompliant with all of his medications for a number of months before onset of respiratory illness earlier this month.  He has restarted and been compliant with blood pressure medications as well as diabetic medications including insulin, metformin and Farxiga.  He reports previously when taking his medications he had not had adverse reactions of abdominal pain vomiting or diarrhea.  Has however restarted all medications including antihypertensives, antihyperlipidemia and diabetic medications after a hiatus of at least several months.  Upon recheck, patient is still very uncomfortable in appearance.  He has nausea.  Abdominal exam is soft but with significant discomfort in the upper mid abdomen. Physical Exam  BP (!) 169/101   Pulse 91   Temp 98.6 F (37 C) (Oral)   Resp 18   Ht 5\' 10"  (1.778 m)   Wt 80.7 kg   SpO2 98%   BMI 25.54 kg/m   Physical Exam  Procedures  Procedures  ED Course / MDM    Medical Decision Making Amount and/or Complexity of Data Reviewed Labs: ordered.  Risk Prescription drug management.   With persisting pain and nausea after first liter of fluids and Reglan, I will proceed with a liter of lactated Ringer's, Protonix, Benadryl and Dilaudid.  I have personally reviewed interpretations of the patient's CT chest and abdomen from 12\14.  Findings include thickening of proximal jejunum and groundglass appearance in the lungs for possible atypical infection or pulmonary edema.  At this point I will add additional lab work for EKG, troponin, BNP PE, COVID and influenza as well as additional electrolyte panels for magnesium and phosphorus.  Nausea improved with addition of Benadryl, Protonix and Dilaudid.   Patient is able to rest now.  No further dry heaves at this time.  Review of additional lab work, EKG similar to previous 2 days ago.  Troponin slight elevation at 183.  Possible demand ischemia with recent illness and will require trending and observation.  CT scan from approximate 36 hours ago does show findings of enteritis.  Patient continues to have pain and nausea with comorbid conditions of insulin-dependent diabetes.  Patient will require admission for fluid resuscitation, electrolyte and blood sugar monitoring and rule out for ACS versus demand ischemia in setting of above illness.         , MD 03/09/22 1014

## 2022-03-09 NOTE — H&P (Signed)
History and Physical    Patient: John [name] BCW:[id number] DOB: [birth year] DOA: 03/09/2022 DOS: the patient was seen and examined on 03/09/2022 PCP: [name], DO  Patient coming from: Home - lives with wife; NOK: Wife, [phone]   Chief Complaint: abdominal pain, n/v  HPI: John [name] is a 56 y.o. male with medical history significant of asthma, DM, and HTN presenting with abdominal pain and n/v.   He reports nausea and abdominal cramping.  Symptoms started 3-4 days ago.  "He's been vomiting terribly", 6-7 times a day.  Lost 20 pounds in 4 days.  He has been to the ER twice.  He works at [employer], was at the ER about a week ago due to asthma, went to the bathroom after someone in the ER waiting room who had GI illness.  He did have diarrhea the first couple of days, none recently, last about 2 days ago.    He hasn't been able to keep anything down. No URI symptoms since the asthma resolved.  He has not taken his home medications since his last ER visit.  No insulin for maybe 3 days.  He reports that he is now feeling hungry.    ER Course:  [facility] transfer:  He was seen at [facility] on 12/[facility] for cough, noted to have significant HTN and was encouraged to take home BP medications. He was given a steroid injection and discharged. He returned to the ER on 12/10 with SOB. He again had HTN and reported not taking his BP medications at home, was given IV hydralazine and home meds. He also reported being out of metformin and this was refilled along with albuterol. He returned overnight to [facility] with n/v. Has troponin bump, ?demand from dehydration. CT had shown enteritis and he is tender. Likely needs hydration, pain control, DM medications, trending troponin.      Review of Systems: As mentioned in the history of present illness. All other systems reviewed and are negative. Past Medical History:  Diagnosis Date   Asthma    Diabetes mellitus without complication ([place])     History of chicken pox    History of DVT (deep vein thrombosis)    s/p trauma of R leg -- 2014. 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 He has never used smokeless tobacco. He reports that he does not drink alcohol and does not use drugs.  Allergies  Allergen Reactions   Zofran [Ondansetron Hcl] Nausea Only    Abdominal pain    Family History  Problem Relation Age of Onset   Heart disease Mother 58   Arthritis Mother    Heart disease Father 23   Stroke Father    Healthy Brother    Heart disease Maternal Grandmother    Diabetes Maternal Uncle     Prior to Admission medications   Medication Sig Start Date End Date Taking? Authorizing Provider  albuterol (VENTOLIN HFA) 108 (90 Base) MCG/ACT inhaler Inhale 2 puffs into the lungs every 4 (four) hours as needed for wheezing or shortness of breath. 03/05/22  [name], DO  amLODipine (NORVASC) 5 MG tablet Take 1 tablet (5 mg total) by mouth daily. 03/05/22   [name], DO  aspirin 81 MG chewable tablet Chew 81 mg by mouth daily.    [provider]  atorvastatin (LIPITOR) 20 MG tablet Take 1 tablet (20 mg total) by mouth daily. 03/05/22   [name], DO  blood glucose meter kit and supplies KIT Dispense based on patient and insurance preference. Use up to four times daily as directed. (FOR ICD-9 250.00, 250.01). 05/19/21   Regalado, Belkys A, MD  carvedilol (COREG) 25 MG tablet Take 1 tablet (25 mg total) by mouth 2 (two) times daily with a meal. 03/05/22   Wendling, [name], DO  dapagliflozin propanediol (FARXIGA) 10 MG TABS tablet Take 1 tablet  (10 mg total) by mouth daily before breakfast. 03/05/22   Wendling, [name], DO  gentamicin cream (GARAMYCIN) 0.1 % Apply 1 Application topically 2 (two) times daily. 02/04/22   [name], DPM  glucose blood test strip Use as instructed, Check blood sugars twice daily 05/19/21   Regalado, Belkys A, MD  insulin glargine-yfgn (SEMGLEE, YFGN,) 100 UNIT/ML Pen Inject 12 Units into the skin at bedtime. 10/19/21   [name], DO  Insulin Pen Needle (NOVOFINE PLUS PEN NEEDLE) 32G X 4 MM MISC Use daily to insulin 05/19/21   Regalado, Belkys A, MD  Lancets (ONETOUCH ULTRASOFT) lancets Use as instructed, Check blood sugars twice daily 05/19/21   Regalado, Belkys A, MD  levocetirizine (XYZAL) 5 MG tablet Take 1 tablet (5 mg total) by mouth every evening. Patient taking differently: Take 5 mg by mouth daily as needed for allergies. 12/01/19   [name], DO  lisinopril (ZESTRIL) 20 MG tablet Take 1 tablet (20 mg total) by mouth daily. 03/05/22   [name], DO  metFORMIN (GLUCOPHAGE-XR) 500 MG 24 hr tablet Take 2 tablets (1,000 mg total) by mouth in the morning and at bedtime. 03/05/22   [name], DO  promethazine (PHENERGAN) 25 MG tablet Take 1 tablet (25 mg total) by mouth every 8 (eight) hours as needed for nausea or vomiting. 03/07/22   Ezequiel Essex, MD    Physical Exam: Vitals:   03/09/22 1300 03/09/22 1607 03/09/22 1710 03/09/22 1720  BP: (!) 196/116 (!) 174/106 (!) 161/73 (!) 172/98  Pulse: 87 88    Resp: 18 17    Temp:  99.2 F (37.3 C)    TempSrc:  Oral    SpO2: 98% 94%    Weight:  72.4 kg    Height:  _0  (1.778 m)     General:  Appears calm and comfortable and is in NAD Eyes:  EOMI, normal lids, iris ENT:  grossly normal hearing, lips & tongue, mmm; appropriate dentition Neck:  no LAD, masses or thyromegaly Cardiovascular:  RRR, no m/r/g. No LE edema.  Respiratory:   CTA bilaterally with no wheezes/rales/rhonchi.  Normal  respiratory effort. Abdomen:  soft, NT, ND Skin:  no rash or induration seen on limited exam Musculoskeletal:  grossly normal tone BUE/BLE, good ROM, no bony abnormality Psychiatric:  grossly normal mood and affect, speech fluent and appropriate, AOx3 Neurologic:  CN 2-12 grossly intact, moves all extremities in coordinated fashion   Radiological Exams on Admission: Independently reviewed - see discussion in A/P where applicable  No results found.  EKG: Independently reviewed.  NSR with rate 99; prolonged QTc 541; nonspecific ST changes with no evidence  of acute ischemia   Labs on Admission: I have personally reviewed the available labs and imaging studies at the time of the admission.  Pertinent labs:    Glucose 193 Bili 1.4 BNP 318.2 CK 225 HS troponin 183, 201 Lactate 1.3, 1.4 WBC 11.3 COVID/flu/RSV negative UA: >1000 glucose, trace Hgb, 15 ketones, 100 protein, rare bacteria UDS negative on 12/14   Assessment and Plan: Principal Problem:   Enteritis Active Problems:   Type 2 diabetes mellitus with hyperglycemia, with long-term current use of insulin (HCC)   Essential hypertension   Hyperlipidemia LDL goal <100   Elevated troponin   Intermittent asthma    Enteritis -Patient with acute onset of abdominal pain and n/v several days ago -He had diarrhea initially but this appears to have resolved so will not order stool studies at this time -Likely gastroenteritis - either viral or food borne -He appears to be improved at this time and reports feeling hungry, has tolerated some liquids -Will observe on telemetry overnight -Clear liquid diet, advance as tolerated to bland diet  Elevated troponin -Likely associated with demand ischemia, as he has not had any chest pain -His repeat was still increasing although technically just shy of a positive delta -Will repeat and continue to trend until it peaks -If still uptrending in the AM, he likely needs cardiology  consult  Asthma -Mild intermittent -Continue albuterol  HTN -He has not been taking his medications and his BP is consistently elevated -Will resume home amlodipine and carvedilol now, lisinopril in the AM  DM -Recent A1c was 9.5, indicating poor control -hold Glucophage, Farxiga -Continue glargine -Cover with moderate-scale SSI   HLD -Continue atorvastatin    Advance Care Planning:   Code Status: Full Code - Code status was discussed with the patient and/or family at the time of admission.  The patient would want to receive full resuscitative measures at this time.   Consults: None  DVT Prophylaxis: Lovenox  Family Communication: Wife was present throughout evalaution  Severity of Illness: The appropriate patient status for this patient is OBSERVATION. Observation status is judged to be reasonable and necessary in order to provide the required intensity of service to ensure the patient's safety. The patient's presenting symptoms, physical exam findings, and initial radiographic and laboratory data in the context of their medical condition is felt to place them at decreased risk for further clinical deterioration. Furthermore, it is anticipated that the patient will be medically stable for discharge from the hospital within 2 midnights of admission.   Author: Karmen Bongo, MD 03/09/2022 5:43 PM  For on call review [email or web address].

## 2022-03-09 NOTE — ED Triage Notes (Signed)
Pt states that he has had generalized abdominal pain with n/v/d x 4 days. Pt was seen here on 12/14 and diagnosed with GI virus. Pt states that pain has worsened and he has also had increased weakness/fatigue and decreased p.o. intake

## 2022-03-09 NOTE — ED Provider Notes (Signed)
[facility] EMERGENCY DEPT Provider Note   CSN: [id number] Arrival date & time: 03/09/22  0016     History {Add pertinent medical, surgical, social history, OB history to HPI:1} Chief Complaint  Patient presents with   Abdominal Pain    John [name] is a 56 y.o. male.  HPI     This is a 59 male with a history of diabetes who presents with ongoing abdominal pain and nausea.  Patient was seen on 12/14.  He was diagnosed with gastroenteritis.  Reports ongoing generalized abdominal discomfort and weakness.  Wife reports that he continues to dry heave despite promethazine.  No known history of gastroparesis.  Reports pain is crampy and diffuse.  Home Medications Prior to Admission medications   Medication Sig Start Date End Date Taking? Authorizing Provider  albuterol (VENTOLIN HFA) 108 (90 Base) MCG/ACT inhaler Inhale 2 puffs into the lungs every 4 (four) hours as needed for wheezing or shortness of breath. 03/05/22   [name], DO  amLODipine (NORVASC) 5 MG tablet Take 1 tablet (5 mg total) by mouth daily. 03/05/22   [name], DO  aspirin 81 MG chewable tablet Chew 81 mg by mouth daily.    [provider]  atorvastatin (LIPITOR) 20 MG tablet Take 1 tablet (20 mg total) by mouth daily. 03/05/22   [name], DO  blood glucose meter kit and supplies KIT Dispense based on patient and insurance preference. Use up to four times daily as directed. (FOR ICD-9 250.00, 250.01). 05/19/21   Regalado, Belkys A, MD  carvedilol (COREG) 25 MG tablet Take 1 tablet (25 mg total) by mouth 2 (two) times daily with a meal. 03/05/22   Wendling, [name], DO  dapagliflozin propanediol (FARXIGA) 10 MG TABS tablet Take 1 tablet (10 mg total) by mouth daily before breakfast. 03/05/22   Wendling, [name], DO  gentamicin cream (GARAMYCIN) 0.1 % Apply 1 Application topically 2 (two) times daily. 02/04/22   [name], DPM  glucose  blood test strip Use as instructed, Check blood sugars twice daily 05/19/21   Regalado, Belkys A, MD  insulin glargine-yfgn (SEMGLEE, YFGN,) 100 UNIT/ML Pen Inject 12 Units into the skin at bedtime. 10/19/21   [name], DO  Insulin Pen Needle (NOVOFINE PLUS PEN NEEDLE) 32G X 4 MM MISC Use daily to insulin 05/19/21   Regalado, Belkys A, MD  Lancets (ONETOUCH ULTRASOFT) lancets Use as instructed, Check blood sugars twice daily 05/19/21   Regalado, Belkys A, MD  levocetirizine (XYZAL) 5 MG tablet Take 1 tablet (5 mg total) by mouth every evening. Patient taking differently: Take 5 mg by mouth daily as needed for allergies. 12/01/19   [name], DO  lisinopril (ZESTRIL) 20 MG tablet Take 1 tablet (20 mg total) by mouth daily. 03/05/22   [name], DO  metFORMIN (GLUCOPHAGE-XR) 500 MG 24 hr tablet Take 2 tablets (1,000 mg total) by mouth in the morning and at bedtime. 03/05/22   [name], DO  promethazine (PHENERGAN) 25 MG tablet Take 1 tablet (25 mg total) by mouth every 8 (eight) hours as needed for nausea or vomiting. 03/07/22   Rancour, Annie Main, MD      Allergies    Zofran Alvis Lemmings hcl]    Review of Systems   Review of Systems  Constitutional:  Negative for fever.  Gastrointestinal:  Positive for abdominal pain, nausea and vomiting.  All other systems reviewed and are negative.   Physical Exam Updated  Vital Signs BP (!) 176/111   Pulse 88   Temp 97.8 F (36.6 C) (Oral)   Resp 20   Ht 1.778 m (_0 )   Wt 80.7 kg   SpO2 100%   BMI 25.54 kg/m  Physical Exam Vitals and nursing note reviewed.  Constitutional:      Appearance: He is well-developed. He is not ill-appearing.  HENT:     Head: Normocephalic and atraumatic.     Mouth/Throat:     Comments: Mucous membranes dry Eyes:     Pupils: Pupils are equal, round, and reactive to light.  Cardiovascular:     Rate and Rhythm: Normal rate and regular rhythm.     Heart sounds:  Normal heart sounds. No murmur heard. Pulmonary:     Effort: Pulmonary effort is normal. No respiratory distress.     Breath sounds: Normal breath sounds. No wheezing.  Abdominal:     General: Bowel sounds are normal.     Palpations: Abdomen is soft.     Tenderness: There is generalized abdominal tenderness. There is no guarding or rebound.  Musculoskeletal:     Cervical back: Neck supple.  Lymphadenopathy:     Cervical: No cervical adenopathy.  Skin:    General: Skin is warm and dry.  Neurological:     Mental Status: He is alert and oriented to person, place, and time.  Psychiatric:        Mood and Affect: Mood normal.     ED Results / Procedures / Treatments   Labs (all labs ordered are listed, but only abnormal results are displayed) Labs Reviewed  LIPASE, BLOOD - Abnormal; Notable for the following components:      Result Value   Lipase <10 (*)    All other components within normal limits  COMPREHENSIVE METABOLIC PANEL - Abnormal; Notable for the following components:   Glucose, Bld 193 (*)    BUN 24 (*)    Total Protein 8.5 (*)    Total Bilirubin 1.4 (*)    All other components within normal limits  CBC - Abnormal; Notable for the following components:   WBC 11.3 (*)    All other components within normal limits  URINALYSIS, ROUTINE W REFLEX MICROSCOPIC - Abnormal; Notable for the following components:   Glucose, UA >1,000 (*)    Hgb urine dipstick TRACE (*)    Ketones, ur 15 (*)    Protein, ur 100 (*)    Bacteria, UA RARE (*)    All other components within normal limits  CBG MONITORING, ED - Abnormal; Notable for the following components:   Glucose-Capillary 188 (*)    All other components within normal limits    EKG None  Radiology CT ABDOMEN PELVIS W CONTRAST  Result Date: 03/07/2022 CLINICAL DATA:  Acute generalized abdominal pain, nausea, vomiting. EXAM: CT ABDOMEN AND PELVIS WITH CONTRAST TECHNIQUE: Multidetector CT imaging of the abdomen and pelvis  was performed using the standard protocol following bolus administration of intravenous contrast. RADIATION DOSE REDUCTION: This exam was performed according to the departmental dose-optimization program which includes automated exposure control, adjustment of the mA and/or kV according to patient size and/or use of iterative reconstruction technique. CONTRAST:  55m OMNIPAQUE IOHEXOL 300 MG/ML  SOLN COMPARISON:  April 26, 2021. FINDINGS: Lower chest: No acute abnormality. Hepatobiliary: No focal liver abnormality is seen. No gallstones, gallbladder wall thickening, or biliary dilatation. Pancreas: Unremarkable. No pancreatic ductal dilatation or surrounding inflammatory changes. Spleen: Normal in size without focal abnormality. Adrenals/Urinary  Tract: Adrenal glands are unremarkable. Kidneys are normal, without renal calculi, focal lesion, or hydronephrosis. Bladder is unremarkable. Stomach/Bowel: Stomach is within normal limits. Appendix appears normal. There is no evidence of bowel obstruction. However, substantial wall thickening is seen involving proximal jejunum concerning for enteritis. Vascular/Lymphatic: No significant vascular findings are present. No enlarged abdominal or pelvic lymph nodes. Reproductive: Prostate is unremarkable. Other: No abdominal wall hernia or abnormality. Small amount of free fluid is noted in the right pericolic gutter and posterior pelvis which may be related to enteritis. Musculoskeletal: No acute or significant osseous findings. IMPRESSION: Wall thickening of proximal jejunum is noted concerning for enteritis. Minimal amount of free fluid is noted in the right pericolic gutter and posterior pelvis which may be secondary to inflammation. Electronically Signed   By: Marijo Conception M.D.   On: 03/07/2022 17:19    Procedures Procedures  {Document cardiac monitor, telemetry assessment procedure when appropriate:1}  Medications Ordered in ED Medications  sodium chloride 0.9  % bolus 1,000 mL (has no administration in time range)  metoCLOPramide (REGLAN) injection 10 mg (has no administration in time range)    ED Course/ Medical Decision Making/ A&P                           Medical Decision Making Amount and/or Complexity of Data Reviewed Labs: ordered.  Risk Prescription drug management.   ***  {Document critical care time when appropriate:1} {Document review of labs and clinical decision tools ie heart score, Chads2Vasc2 etc:1}  {Document your independent review of radiology images, and any outside records:1} {Document your discussion with family members, caretakers, and with consultants:1} {Document social determinants of health affecting pt's care:1} {Document your decision making why or why not admission, treatments were needed:1} Final Clinical Impression(s) / ED Diagnoses Final diagnoses:  None    Rx / DC Orders ED Discharge Orders     None

## 2022-03-09 NOTE — Progress Notes (Signed)
Plan of Care Note for accepted transfer   Patient: John [name] MRN: [id number]   DOA: 03/09/2022  Facility requesting transfer: [facility] Requesting Provider: Donnald Garre Reason for transfer: Elevated troponin  Facility course: Patient with h/o asthma, DM, and HTN presenting with abdominal pain and n/v.  He was seen at [facility] on 12/8 for cough, noted to have significant HTN and was encouraged to take home BP medications.  He was given a steroid injection and discharged.  He returned to the ER on 12/10 with SOB.  He again had HTN and reported not taking his BP medications at home, was given IV hydralazine and home meds.  He also reported being out of metformin and this was refilled along with albuterol.  He returned overnight to [facility] with n/v.  Has troponin bump, ?demand from dehydration.  CT had shown enteritis and he is tender.  Likely needs hydration, pain control, DM medications, trending troponin.  Will place in telemetry observation but if he improves prior to transfer and troponin is stable he may be able to dc from the ER.   Plan of care: The patient is accepted for observation to Telemetry unit, at [facility] or [facility].    Author: Jonah Blue, MD 03/09/2022  Check [email or web address] for on-call coverage.  Nursing staff, Please call [facility] Admits & Consults System-Wide number on Amion as soon as patient's arrival, so appropriate admitting provider can evaluate the pt.

## 2022-03-09 NOTE — Plan of Care (Signed)

## 2022-03-10 ENCOUNTER — Observation Stay (HOSPITAL_COMMUNITY): Payer: 59

## 2022-03-10 DIAGNOSIS — I10 Essential (primary) hypertension: Secondary | ICD-10-CM

## 2022-03-10 DIAGNOSIS — I429 Cardiomyopathy, unspecified: Secondary | ICD-10-CM

## 2022-03-10 DIAGNOSIS — K529 Noninfective gastroenteritis and colitis, unspecified: Secondary | ICD-10-CM

## 2022-03-10 DIAGNOSIS — R7889 Finding of other specified substances, not normally found in blood: Secondary | ICD-10-CM | POA: Diagnosis not present

## 2022-03-10 LAB — RESPIRATORY PANEL BY PCR

## 2022-03-10 LAB — CBC
HCT: 39.5 % (ref 39.0–52.0)
Hemoglobin: 13.7 g/dL (ref 13.0–17.0)
MCH: 31.3 pg (ref 26.0–34.0)
MCHC: 34.7 g/dL (ref 30.0–36.0)
MCV: 90.2 fL (ref 80.0–100.0)
Platelets: 232 10*3/uL (ref 150–400)
RBC: 4.38 MIL/uL (ref 4.22–5.81)
RDW: 12.2 % (ref 11.5–15.5)
WBC: 8.8 10*3/uL (ref 4.0–10.5)
nRBC: 0 % (ref 0.0–0.2)

## 2022-03-10 LAB — LIPID PANEL
Cholesterol: 183 mg/dL (ref 0–200)
HDL: 45 mg/dL (ref >40)
LDL Cholesterol: 124 mg/dL — ABNORMAL HIGH (ref 0–99)
Total CHOL/HDL Ratio: 4.1 RATIO
Triglycerides: 68 mg/dL (ref <150)
VLDL: 14 mg/dL (ref 0–40)

## 2022-03-10 LAB — ECHOCARDIOGRAM COMPLETE
Area-P 1/2: 3.91 cm2
Height: 70 in
MV M vel: 4.98 m/s
MV Peak grad: 99.2 mmHg
Radius: 0.4 cm
S' Lateral: 4.9 cm
Single Plane A4C EF: 29.8 %
Weight: 2555.2 oz

## 2022-03-10 LAB — BASIC METABOLIC PANEL
Anion gap: 6 (ref 5–15)
BUN: 16 mg/dL (ref 6–20)
CO2: 27 mmol/L (ref 22–32)
Calcium: 8.3 mg/dL — ABNORMAL LOW (ref 8.9–10.3)
Chloride: 104 mmol/L (ref 98–111)
Creatinine, Ser: 1.21 mg/dL (ref 0.61–1.24)
GFR, Estimated: >60 mL/min (ref >60)
Glucose, Bld: 142 mg/dL — ABNORMAL HIGH (ref 70–99)
Potassium: 3.1 mmol/L — ABNORMAL LOW (ref 3.5–5.1)
Sodium: 137 mmol/L (ref 135–145)

## 2022-03-10 LAB — GLUCOSE, CAPILLARY
Glucose-Capillary: 116 mg/dL — ABNORMAL HIGH (ref 70–99)
Glucose-Capillary: 154 mg/dL — ABNORMAL HIGH (ref 70–99)
Glucose-Capillary: 158 mg/dL — ABNORMAL HIGH (ref 70–99)
Glucose-Capillary: 162 mg/dL — ABNORMAL HIGH (ref 70–99)
Glucose-Capillary: 199 mg/dL — ABNORMAL HIGH (ref 70–99)
Glucose-Capillary: 219 mg/dL — ABNORMAL HIGH (ref 70–99)

## 2022-03-10 LAB — MAGNESIUM: Magnesium: 1.9 mg/dL (ref 1.7–2.4)

## 2022-03-10 MED ORDER — SPIRONOLACTONE 12.5 MG HALF TABLET
12.5000 mg | ORAL_TABLET | Freq: Every day | ORAL | Status: DC
  Administered 2022-03-10 – 2022-03-12 (×3): 12.5 mg via ORAL
  Filled 2022-03-10 (×3): qty 1

## 2022-03-10 MED ORDER — DAPAGLIFLOZIN PROPANEDIOL 5 MG PO TABS
5.0000 mg | ORAL_TABLET | Freq: Every day | ORAL | Status: DC
  Filled 2022-03-10: qty 1

## 2022-03-10 MED ORDER — ATORVASTATIN CALCIUM 40 MG PO TABS
40.0000 mg | ORAL_TABLET | Freq: Every day | ORAL | Status: DC
  Administered 2022-03-11 – 2022-03-12 (×2): 40 mg via ORAL
  Filled 2022-03-10 (×2): qty 1

## 2022-03-10 MED ORDER — POTASSIUM CHLORIDE CRYS ER 20 MEQ PO TBCR
40.0000 meq | EXTENDED_RELEASE_TABLET | ORAL | Status: AC
  Administered 2022-03-10 (×2): 40 meq via ORAL
  Filled 2022-03-10 (×2): qty 2

## 2022-03-10 MED ORDER — DAPAGLIFLOZIN PROPANEDIOL 10 MG PO TABS
10.0000 mg | ORAL_TABLET | Freq: Every day | ORAL | Status: DC
  Administered 2022-03-11 – 2022-03-12 (×2): 10 mg via ORAL
  Filled 2022-03-10 (×2): qty 1

## 2022-03-10 MED ORDER — LOSARTAN POTASSIUM 25 MG PO TABS
25.0000 mg | ORAL_TABLET | Freq: Every day | ORAL | Status: DC
  Administered 2022-03-11 – 2022-03-12 (×2): 25 mg via ORAL
  Filled 2022-03-10 (×2): qty 1

## 2022-03-10 NOTE — Progress Notes (Signed)
PROGRESS NOTE    John [name]  LOV:[id number] DOB: 1966-02-02 DOA: 03/09/2022 PCP: Sharlene Dory, DO  Chief Complaint  Patient presents with   Abdominal Pain    Brief Narrative:  John [name] is John [name] 56 y.o. male with medical history significant of asthma, DM, and HTN presenting with abdominal pain and n/v.   John [name] reports nausea and abdominal cramping.  Symptoms started 3-4 days ago.  "John [name]'s been vomiting terribly", 6-7 times Janell Keeling day.  Lost 20 pounds in 4 days.  John [name] has been to the ER twice.  John [name] works at [employer], was at the ER about Zakhari Fogel week ago due to asthma, went to the bathroom after someone in the ER waiting room who had GI illness.  John [name] did have diarrhea the first couple of days, none recently, last about 2 days ago.    John [name] hasn't been able to keep anything down. No URI symptoms since the asthma resolved.  John [name] has not taken his home medications since his last ER visit.  No insulin for maybe 3 days.  John [name] reports that John [name] is now feeling hungry.   ER Course:  [facility] transfer:   John [name] was seen at [facility] on 12/8 for cough, noted to have significant HTN and was encouraged to take home BP medications. John [name] was given Hawkins Seaman steroid injection and discharged. John [name] returned to the ER on 12/10 with SOB. John [name] again had HTN and reported not taking his BP medications at home, was given IV hydralazine and home meds. John [name] also reported being out of metformin and this was refilled along with albuterol. John [name] returned overnight to [facility] with n/v. Has troponin bump, ?demand from dehydration. CT had shown enteritis and John [name] is tender. Likely needs hydration, pain control, DM medications, trending troponin.    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  Needs ischemic evaluation, will defer decision for timing of this to cardiology.  Coreg, spironolactone, farxiga.  Will start losartan with plans to eventually transition to entresto. Cardiology c/s, appreciate recs - will defer to cards timing of ischemic evaluation  Elevated Troponin Never any CP and no CP with exertion at baseline Echo as above Follow A1c, lipid panel (LDL 124) Ischemic evaluation per cards Will increase lipitor to 40 mg  Hypertension Meds as above Stopping amlodipine  Enteritis Seems to be resolved at this time Wall thickening of proximal jejunum concerning for enteritis on 12/14 CT  T2DM Follow repeat A1c Holding metformin John [name]'s on farxiga at home Continue basal insulin   HLD Continue atorvastatin -> increase to 40 mg with LDL 124  Prolonged Qtc Avoid qt prolonging meds Repeat EKG  Replace lytes    DVT prophylaxis: lovenox Code Status: full Family Communication: wife Disposition:   Status is: Observation The patient will require care spanning > 2 midnights and should be moved to inpatient because: pending further cards evaluation for new onset HF   Consultants:  cardiology  Procedures:  Echo IMPRESSIONS     1. Left ventricular ejection fraction, by estimation, is 25 to 30%. The  left ventricle has severely decreased function. The left ventricle  demonstrates global hypokinesis.  There is mild concentric left ventricular  hypertrophy. Left ventricular diastolic   parameters are consistent with Grade I diastolic dysfunction (impaired  relaxation).   2. Right ventricular systolic function is normal. The right ventricular  size is normal. Tricuspid regurgitation signal is inadequate for assessing  PA pressure.   3. Left atrial size was moderately dilated.   4. Right atrial size was mildly dilated.   5. The mitral valve is normal in  structure. Mild mitral valve  regurgitation. No evidence of mitral stenosis.   6. The aortic valve is tricuspid. Aortic valve regurgitation is not  visualized.   7. The inferior vena cava is normal in size with greater than 50%  respiratory variability, suggesting right atrial pressure of 3 mmHg.   Comparison(s): No prior Echocardiogram.   Antimicrobials:  Anti-infectives (From admission, onward)    None       Subjective: No new complaints Abdominal symptoms resolved  Objective: Vitals:   03/10/22 0335 03/10/22 0835 03/10/22 1109 03/10/22 1620  BP: (!) 143/99 (!) 154/101 134/82 (!) 133/111  Pulse: 88 87 93 96  Resp: Temp: 97.6 F (36.4 C) 98.3 F (36.8 C) 97.9 F (36.6 C) 97.9 F (36.6 C)  TempSrc: Oral Oral Oral Oral  SpO2: 97% 96% 97% 96%  Weight:      Height:        Intake/Output Summary (Last 24 hours) at 03/10/2022 1715 Last data filed at 03/09/2022 2143 Gross per 24 hour  Intake 815.81 ml  Output --  Net 815.81 ml   Filed Weights   03/09/22 0106 03/09/22 1607  Weight: 80.7 kg 72.4 kg    Examination:  General exam: Appears calm and comfortable  Respiratory system: Clear to auscultation. Respiratory effort normal. Cardiovascular system: S1 & S2 heard, RRR. Gastrointestinal system: Abdomen is nondistended, soft and nontender Central nervous system: Alert and oriented. No focal neurological deficits. Extremities: no LEE    Data Reviewed: I have personally reviewed following labs and imaging studies  CBC: Recent Labs  Lab 03/07/22 0540 03/07/22 1206 03/09/22 0131 03/10/22 0141  WBC 9.9 8.4 11.3* 8.8  NEUTROABS 7.1  --   --   --   HGB 13.4 14.1 16.0 13.7  HCT 39.4 42.4 47.3 39.5  MCV 93.1 92.8 91.0 90.2  PLT 248 212 279 232    Basic Metabolic Panel: Recent Labs  Lab 03/07/22 0540 03/07/22 1206 03/09/22 0131 03/09/22 0843 03/10/22 0141  NA 141 142 136  --  137  K 4.1 4.1 3.8  --  3.1*  CL 106 103 98  --  104  CO2 --  27  GLUCOSE 214* 175* 193*  --  142*  BUN 22* 23* 24*  --  16  CREATININE 1.52* 1.25* 1.20  --  1.21  CALCIUM 9.4 9.8 9.8  --  8.3*  MG  --   --   --  2.1 1.9  PHOS  --   --   --  3.5  --     GFR: Estimated Creatinine Clearance: 70.6 mL/min (by C-G formula based on SCr of 1.21 mg/dL).  Liver Function Tests: Recent Labs  Lab 03/07/22 0540 03/07/22 1206 03/09/22 0131  AST ALT ALKPHOS 59 57 65  BILITOT 0.7 1.2 1.4*  PROT 7.4 7.8 8.5*  ALBUMIN 3.4* 4.2 4.4    CBG: Recent Labs  Lab 03/10/22 0003 03/10/22 0424 03/10/22 0851 03/10/22  1107 03/10/22 1619  GLUCAP 154* 162* 158* 116* 219*     Recent Results (from the past 240 hour(s))  Resp Panel by RT-PCR (Flu Madolyn Ackroyd&B, Covid) Anterior Nasal Swab     Status: None   Collection Time: 03/03/22 12:44 AM   Specimen: Anterior Nasal Swab  Result Value Ref Range Status   SARS Coronavirus 2 by RT PCR NEGATIVE NEGATIVE Final    Comment: (NOTE) SARS-CoV-2 target nucleic acids are NOT DETECTED.  The SARS-CoV-2 RNA is generally detectable in upper respiratory specimens during the acute phase of infection. The lowest concentration of SARS-CoV-2 viral copies this assay can detect is 138 copies/mL. Tadeusz Stahl negative result does not preclude SARS-Cov-2 infection and should not be used as the sole basis for treatment or other patient management decisions. Timia Casselman negative result may occur with  improper specimen collection/handling, submission of specimen other than nasopharyngeal swab, presence of viral mutation(s) within the areas targeted by this assay, and inadequate number of viral copies(<138 copies/mL). Ferdinando Lodge negative result must be combined with clinical observations, patient history, and epidemiological information. The expected result is Negative.  Fact Sheet for Patients:  BloggerCourse.com  Fact Sheet for Healthcare Providers:  SeriousBroker.it  This test is  no t yet approved or cleared by the [place] FDA and  has been authorized for detection and/or diagnosis of SARS-CoV-2 by FDA under an Emergency Use Authorization (EUA). This EUA will remain  in effect (meaning this test can be used) for the duration of the COVID-19 declaration under Section 564(b)(1) of the Act, 21 U.S.C.section 360bbb-3(b)(1), unless the authorization is terminated  or revoked sooner.       Influenza Ziara Thelander by PCR NEGATIVE NEGATIVE Final   Influenza B by PCR NEGATIVE NEGATIVE Final    Comment: (NOTE) The Xpert Xpress SARS-CoV-2/FLU/RSV plus assay is intended as an aid in the diagnosis of influenza from Nasopharyngeal swab specimens and should not be used as Rahkeem Senft sole basis for treatment. Nasal washings and aspirates are unacceptable for Xpert Xpress SARS-CoV-2/FLU/RSV testing.  Fact Sheet for Patients: BloggerCourse.com  Fact Sheet for Healthcare Providers: SeriousBroker.it  This test is not yet approved or cleared by the [place] FDA and has been authorized for detection and/or diagnosis of SARS-CoV-2 by FDA under an Emergency Use Authorization (EUA). This EUA will remain in effect (meaning this test can be used) for the duration of the COVID-19 declaration under Section 564(b)(1) of the Act, 21 U.S.C. section 360bbb-3(b)(1), unless the authorization is terminated or revoked.  Performed at [facility], [address] [place], [place] [address]   Resp panel by RT-PCR (RSV, Flu Gioia Ranes&B, Covid) Anterior Nasal Swab     Status: None   Collection Time: 03/09/22  8:43 AM   Specimen: Anterior Nasal Swab  Result Value Ref Range Status   SARS Coronavirus 2 by RT PCR NEGATIVE NEGATIVE Final    Comment: (NOTE) SARS-CoV-2 target nucleic acids are NOT DETECTED.  The SARS-CoV-2 RNA is generally detectable in upper respiratory specimens during the acute phase of infection. The lowest concentration of SARS-CoV-2  viral copies this assay can detect is 138 copies/mL. Avani Sensabaugh negative result does not preclude SARS-Cov-2 infection and should not be used as the sole basis for treatment or other patient management decisions. Floy Angert negative result may occur with  improper specimen collection/handling, submission of specimen other than nasopharyngeal swab, presence of viral mutation(s) within the areas targeted by this assay, and inadequate number of viral copies(<138 copies/mL). Diontay Rosencrans negative result must be combined  with clinical observations, patient history, and epidemiological information. The expected result is Negative.  Fact Sheet for Patients:  BloggerCourse.com  Fact Sheet for Healthcare Providers:  SeriousBroker.it  This test is no t yet approved or cleared by the [place] FDA and  has been authorized for detection and/or diagnosis of SARS-CoV-2 by FDA under an Emergency Use Authorization (EUA). This EUA will remain  in effect (meaning this test can be used) for the duration of the COVID-19 declaration under Section 564(b)(1) of the Act, 21 U.S.C.section 360bbb-3(b)(1), unless the authorization is terminated  or revoked sooner.       Influenza Maximo Spratling by PCR NEGATIVE NEGATIVE Final   Influenza B by PCR NEGATIVE NEGATIVE Final    Comment: (NOTE) The Xpert Xpress SARS-CoV-2/FLU/RSV plus assay is intended as an aid in the diagnosis of influenza from Nasopharyngeal swab specimens and should not be used as Clyde Zarrella sole basis for treatment. Nasal washings and aspirates are unacceptable for Xpert Xpress SARS-CoV-2/FLU/RSV testing.  Fact Sheet for Patients: BloggerCourse.com  Fact Sheet for Healthcare Providers: SeriousBroker.it  This test is not yet approved or cleared by the [place] FDA and has been authorized for detection and/or diagnosis of SARS-CoV-2 by FDA under an Emergency Use Authorization  (EUA). This EUA will remain in effect (meaning this test can be used) for the duration of the COVID-19 declaration under Section 564(b)(1) of the Act, 21 U.S.C. section 360bbb-3(b)(1), unless the authorization is terminated or revoked.     Resp Syncytial Virus by PCR NEGATIVE NEGATIVE Final    Comment: (NOTE) Fact Sheet for Patients: BloggerCourse.com  Fact Sheet for Healthcare Providers: SeriousBroker.it  This test is not yet approved or cleared by the [place] FDA and has been authorized for detection and/or diagnosis of SARS-CoV-2 by FDA under an Emergency Use Authorization (EUA). This EUA will remain in effect (meaning this test can be used) for the duration of the COVID-19 declaration under Section 564(b)(1) of the Act, 21 U.S.C. section 360bbb-3(b)(1), unless the authorization is terminated or revoked.  Performed at [facility], [address] [place], [place] [address]          Radiology Studies: ECHOCARDIOGRAM COMPLETE  Result Date: 03/10/2022    ECHOCARDIOGRAM REPORT   Patient Name:   John [name] Date of Exam: 03/10/2022 Medical Rec #:  [id number]    Height:       70.0 in Accession #:    [id number]   Weight:       159.7 lb Date of Birth:  1965/10/25     BSA:          1.897 m Patient Age:    56 years     BP:           134/82 mmHg Patient Gender: M            HR:           86 bpm. Exam Location:  Inpatient Procedure: 2D Echo, Color Doppler and Cardiac Doppler Indications:    elevated troponin  History:        Patient has no prior history of Echocardiogram examinations.                 Chronic kidney disease; Risk Factors:Hypertension, Dyslipidemia                 and Diabetes.  Sonographer:    Delcie Roch RDCS Referring Phys: [id number] Thaddeus Evitts CALDWELL POWELL JR IMPRESSIONS  1. Left ventricular ejection fraction, by estimation,  is 25 to 30%. The left ventricle has severely decreased function. The left  ventricle demonstrates global hypokinesis. There is mild concentric left ventricular hypertrophy. Left ventricular diastolic  parameters are consistent with Grade I diastolic dysfunction (impaired relaxation).  2. Right ventricular systolic function is normal. The right ventricular size is normal. Tricuspid regurgitation signal is inadequate for assessing PA pressure.  3. Left atrial size was moderately dilated.  4. Right atrial size was mildly dilated.  5. The mitral valve is normal in structure. Mild mitral valve regurgitation. No evidence of mitral stenosis.  6. The aortic valve is tricuspid. Aortic valve regurgitation is not visualized.  7. The inferior vena cava is normal in size with greater than 50% respiratory variability, suggesting right atrial pressure of 3 mmHg. Comparison(s): No prior Echocardiogram. FINDINGS  Left Ventricle: Left ventricular ejection fraction, by estimation, is 25 to 30%. The left ventricle has severely decreased function. The left ventricle demonstrates global hypokinesis. The left ventricular internal cavity size was normal in size. There is mild concentric left ventricular hypertrophy. Left ventricular diastolic parameters are consistent with Grade I diastolic dysfunction (impaired relaxation). Right Ventricle: The right ventricular size is normal. No increase in right ventricular wall thickness. Right ventricular systolic function is normal. Tricuspid regurgitation signal is inadequate for assessing PA pressure. Left Atrium: Left atrial size was moderately dilated. Right Atrium: Right atrial size was mildly dilated. Pericardium: There is no evidence of pericardial effusion. Mitral Valve: The mitral valve is normal in structure. Mild mitral valve regurgitation. No evidence of mitral valve stenosis. Tricuspid Valve: The tricuspid valve is normal in structure. Tricuspid valve regurgitation is not demonstrated. No evidence of tricuspid stenosis. Aortic Valve: The aortic valve is  tricuspid. Aortic valve regurgitation is not visualized. Pulmonic Valve: The pulmonic valve was normal in structure. Pulmonic valve regurgitation is trivial. Aorta: The aortic root and ascending aorta are structurally normal, with no evidence of dilitation. Venous: The inferior vena cava is normal in size with greater than 50% respiratory variability, suggesting right atrial pressure of 3 mmHg. IAS/Shunts: No atrial level shunt detected by color flow Doppler.  LEFT VENTRICLE PLAX 2D LVIDd:         5.40 cm      Diastology LVIDs:         4.90 cm      LV e' medial:    5.66 cm/s LV PW:         1.10 cm      LV E/e' medial:  13.5 LV IVS:        1.10 cm      LV e' lateral:   7.18 cm/s LVOT diam:     2.30 cm      LV E/e' lateral: 10.6 LV SV:         39 LV SV Index:   20 LVOT Area:     4.15 cm  LV Volumes (MOD) LV vol d, MOD A4C: 119.0 ml LV vol s, MOD A4C: 83.5 ml LV SV MOD A4C:     119.0 ml RIGHT VENTRICLE            IVC RV Basal diam:  2.90 cm    IVC diam: 1.40 cm RV S prime:     9.36 cm/s TAPSE (M-mode): 1.8 cm LEFT ATRIUM             Index        RIGHT ATRIUM           Index LA diam:  4.70 cm 2.48 cm/m   RA Area:     16.10 cm LA Vol (A2C):   83.6 ml 44.07 ml/m  RA Volume:   44.40 ml  23.41 ml/m LA Vol (A4C):   77.1 ml 40.64 ml/m LA Biplane Vol: 81.9 ml 43.17 ml/m  AORTIC VALVE LVOT Vmax:   57.40 cm/s LVOT Vmean:  35.100 cm/s LVOT VTI:    0.094 m  AORTA Ao Root diam: 3.00 cm Ao Asc diam:  3.30 cm MITRAL VALVE MV Area (PHT): 3.91 cm       SHUNTS MV Decel Time: 194 msec       Systemic VTI:  0.09 m MR Peak grad:    99.2 mmHg    Systemic Diam: 2.30 cm MR Mean grad:    63.0 mmHg MR Vmax:         498.00 cm/s MR Vmean:        371.0 cm/s MR PISA:         1.01 cm MR PISA Eff ROA: 8 mm MR PISA Radius:  0.40 cm MV E velocity: 76.30 cm/s MV Jaquane Boughner velocity: 50.10 cm/s MV E/Harlee Eckroth ratio:  1.52 Riley Lam MD Electronically signed by Riley Lam MD Signature Date/Time: 03/10/2022/4:07:13 PM    Final          Scheduled Meds:  amLODipine  5 mg Oral Daily   atorvastatin  20 mg Oral Daily   carvedilol  25 mg Oral BID WC   dapagliflozin propanediol  5 mg Oral Daily   enoxaparin (LOVENOX) injection  40 mg Subcutaneous Q24H   insulin aspart  0-15 Units Subcutaneous TID WC   insulin aspart  0-5 Units Subcutaneous QHS   insulin glargine-yfgn  12 Units Subcutaneous QHS   [START ON 03/11/2022] losartan  25 mg Oral Daily   sodium chloride flush  3 mL Intravenous Q12H   spironolactone  12.5 mg Oral Daily   Continuous Infusions:   LOS: 0 days    Time spent: over 30 min    Lacretia Nicks, MD [facility]   To contact the attending provider between 7A-7P or the covering provider during after hours 7P-7A, please log into the web site [email or web address] and access using universal Rosalie password for that web site. If you do not have the password, please call the hospital operator.  03/10/2022, 5:15 PM

## 2022-03-10 NOTE — Progress Notes (Signed)
  Echocardiogram 2D Echocardiogram has been performed.  John [name] 03/10/2022, 2:42 PM

## 2022-03-10 NOTE — Consult Note (Signed)
Cardiology Consultation   Patient ID: Dvonte Gatliff MRN: [id number]; DOB: Oct 26, 1965  Admit date: 03/09/2022 Date of Consult: 03/10/2022  PCP:  [name], [place] Prov[facility] Cardiologist:  None      Patient Profile:   John [name] is a 56 y.o. male with a hx of asthma, DM, HTN who is being seen 03/10/2022 for the evaluation of CHF at the request of Dr. Florene Glen.  History of Present Illness:   John [name] is a 55 year old male with above medical history. Patient does not have any known cardiac history and he does not follow with a cardiologist.   Patient recently was see in urgent care on 12/8 complaining of cough. He was found to have BP elevated to 178/130. He had not taken his BP medications that day, so he was told to take his home BP mediations consistently. Given a steroid shot. Later seen on 12/10 in the ED for nonproductive cough and congestion.  He was again found to have elevated bp, reported that he had not taken his BP medications. Was treated with hydralazine and home medications. Note that BNP was elevated to 576 at that visit  Patient presented to the [facility] ED on 12/16 complaining of nausea and abdominal cramping. HE had been vomiting 6-7 times a day, and reported a weight loss of 20 lbs over the past 4 days. Denied URI symptoms. Reported that he had not been compliant with his home medications. In the ED, noted that hsTn elevated to 183>201>192>211. BNP elevated, but down to 318. He was transferred to [facility] for treatment of enteritis and evaluation of elevated troponin.   Echocardiogram on 12/17 showed EF 25-30%, mild LVH, grade I diastolic dysfunction, moderately dilated LA. Cardiology was asked to consult for newly diagnosed chf.    Past Medical History:  Diagnosis Date   Asthma    Diabetes mellitus without complication ([place])    History of chicken pox    History of DVT (deep vein thrombosis)    s/p trauma of R leg -- 2014. No other  history of DVT   History of kidney stones    Hypertension    Lazy eye of left side     Past Surgical History:  Procedure Laterality Date   AMPUTATION TOE Left 05/16/2021   Procedure: AMPUTATION LEFT GREAT  TOE;  Surgeon: Trula Slade, DPM;  Location: [place];  Service: Podiatry;  Laterality: Left;   EYE SURGERY       Home Medications:  Prior to Admission medications   Medication Sig Start Date End Date Taking? Authorizing Provider  albuterol (VENTOLIN HFA) 108 (90 Base) MCG/ACT inhaler Inhale 2 puffs into the lungs every 4 (four) hours as needed for wheezing or shortness of breath. 03/05/22  Yes [name], DO  amLODipine (NORVASC) 5 MG tablet Take 1 tablet (5 mg total) by mouth daily. 03/05/22  Yes [name], DO  atorvastatin (LIPITOR) 20 MG tablet Take 1 tablet (20 mg total) by mouth daily. 03/05/22  Yes [name], DO  gentamicin cream (GARAMYCIN) 0.1 % Apply 1 Application topically 2 (two) times daily. 02/04/22  Yes [name], DPM  insulin glargine-yfgn (SEMGLEE, YFGN,) 100 UNIT/ML Pen Inject 12 Units into the skin at bedtime. 10/19/21  Yes Wendling, [name], DO  levocetirizine (XYZAL) 5 MG tablet Take 1 tablet (5 mg total) by mouth every evening. Patient taking differently: Take 5 mg by mouth daily as needed for allergies.  12/01/19  Yes [name], DO  lisinopril (ZESTRIL) 20 MG tablet Take 1 tablet (20 mg total) by mouth daily. 03/05/22  Yes [name], DO  metFORMIN (GLUCOPHAGE-XR) 500 MG 24 hr tablet Take 2 tablets (1,000 mg total) by mouth in the morning and at bedtime. 03/05/22  Yes Wendling, [name], DO  promethazine (PHENERGAN) 25 MG tablet Take 1 tablet (25 mg total) by mouth every 8 (eight) hours as needed for nausea or vomiting. 03/07/22  Yes Rancour, Annie Main, MD  blood glucose meter kit and supplies KIT Dispense based on patient and insurance preference. Use up to four times daily as directed.  (FOR ICD-9 250.00, 250.01). 05/19/21   Regalado, Belkys A, MD  carvedilol (COREG) 25 MG tablet Take 1 tablet (25 mg total) by mouth 2 (two) times daily with a meal. 03/05/22   Wendling, [name], DO  dapagliflozin propanediol (FARXIGA) 10 MG TABS tablet Take 1 tablet (10 mg total) by mouth daily before breakfast. 03/05/22   Wendling, [name], DO  glucose blood test strip Use as instructed, Check blood sugars twice daily 05/19/21   Regalado, Belkys A, MD  Insulin Pen Needle (NOVOFINE PLUS PEN NEEDLE) 32G X 4 MM MISC Use daily to insulin 05/19/21   Regalado, Belkys A, MD  Lancets (ONETOUCH ULTRASOFT) lancets Use as instructed, Check blood sugars twice daily 05/19/21   Regalado, Belkys A, MD    Inpatient Medications: Scheduled Meds:  amLODipine  5 mg Oral Daily   atorvastatin  20 mg Oral Daily   carvedilol  25 mg Oral BID WC   enoxaparin (LOVENOX) injection  40 mg Subcutaneous Q24H   insulin aspart  0-15 Units Subcutaneous TID WC   insulin aspart  0-5 Units Subcutaneous QHS   insulin glargine-yfgn  12 Units Subcutaneous QHS   lisinopril  20 mg Oral Daily   sodium chloride flush  3 mL Intravenous Q12H   Continuous Infusions:  lactated ringers 100 mL/hr at 03/10/22 1500   PRN Meds: acetaminophen **OR** acetaminophen, albuterol, hydrALAZINE, morphine injection, ondansetron **OR** ondansetron (ZOFRAN) IV, oxyCODONE  Allergies:    Allergies  Allergen Reactions   Zofran [Ondansetron Hcl] Nausea Only    Abdominal pain    Social History:   Social History   Socioeconomic History   Marital status: Married    Spouse name: Not on file   Number of children: Not on file   Years of education: Not on file   Highest education level: Not on file  Occupational History   Occupation: Cook    Comment: [employer]   Tobacco Use   Smoking status: Never   Smokeless tobacco: Never  Substance and Sexual Activity   Alcohol use: No   Drug use: No   Sexual activity: Not on file   Other Topics Concern   Not on file  Social History Narrative   Not on file   Social Determinants of Health   Financial Resource Strain: Not on file  Food Insecurity: No Food Insecurity (03/09/2022)   Hunger Vital Sign    Worried About Running Out of Food in the Last Year: Never true    Ran Out of Food in the Last Year: Never true  Transportation Needs: No Transportation Needs (03/09/2022)   PRAPARE - Hydrologist (Medical): No    Lack of Transportation (Non-Medical): No  Physical Activity: Not on file  Stress: Not on file  Social Connections: Not on file  Intimate Partner Violence:  Not At Risk (03/09/2022)   Humiliation, Afraid, Rape, and Kick questionnaire    Fear of Current or Ex-Partner: No    Emotionally Abused: No    Physically Abused: No    Sexually Abused: No    Family History:    Family History  Problem Relation Age of Onset   Heart disease Mother 44   Arthritis Mother    Heart disease Father 72   Stroke Father    Healthy Brother    Heart disease Maternal Grandmother    Diabetes Maternal Uncle      ROS:  Please see the history of present illness.   All other ROS reviewed and negative.     Physical Exam/Data:   Vitals:   03/10/22 0005 03/10/22 0335 03/10/22 0835 03/10/22 1109  BP: (!) 150/98 (!) 143/99 (!) 154/101 134/82  Pulse: 91 88 87 93  Resp: _0 Temp: 98.3 F (36.8 C) 97.6 F (36.4 C) 98.3 F (36.8 C) 97.9 F (36.6 C)  TempSrc: Oral Oral Oral Oral  SpO2: 94% 97% 96% 97%  Weight:      Height:        Intake/Output Summary (Last 24 hours) at 03/10/2022 1620 Last data filed at 03/09/2022 2143 Gross per 24 hour  Intake 815.81 ml  Output --  Net 815.81 ml      03/09/2022    4:07 PM 03/09/2022    1:06 AM 03/07/2022    5:30 AM  Last 3 Weights  Weight (lbs) 159 lb 11.2 oz 178 lb 178 lb  Weight (kg) 72.439 kg 80.74 kg 80.74 kg     Body mass index is 22.91 kg/m.  General:  Well nourished, well  developed, in no acute distress HEENT: normal Neck: no JVD Cardiac:  normal S1, S2; RRR; no murmur  Lungs:  clear to auscultation bilaterally, no wheezing, rhonchi or rales  Abd: soft, nontender, no hepatomegaly  Ext: no edema Skin: warm and dry  Neuro:  no focal abnormalities noted Psych:  Normal affect   EKG:  The EKG was personally reviewed and demonstrates:  sinus rhythm, HR 99 BPM, LVH, biatrial enlargement    Relevant CV Studies:  Echocardiogram 03/10/22 1. Left ventricular ejection fraction, by estimation, is 25 to 30%. The  left ventricle has severely decreased function. The left ventricle  demonstrates global hypokinesis. There is mild concentric left ventricular  hypertrophy. Left ventricular diastolic   parameters are consistent with Grade I diastolic dysfunction (impaired  relaxation).   2. Right ventricular systolic function is normal. The right ventricular  size is normal. Tricuspid regurgitation signal is inadequate for assessing  PA pressure.   3. Left atrial size was moderately dilated.   4. Right atrial size was mildly dilated.   5. The mitral valve is normal in structure. Mild mitral valve  regurgitation. No evidence of mitral stenosis.   6. The aortic valve is tricuspid. Aortic valve regurgitation is not  visualized.   7. The inferior vena cava is normal in size with greater than 50%  respiratory variability, suggesting right atrial pressure of 3 mmHg.   Comparison(s): No prior Echocardiogram.   Laboratory Data:  High Sensitivity Troponin:   Recent Labs  Lab 03/09/22 0843 03/09/22 1118 03/09/22 1752 03/09/22 2015  TROPONINIHS 183* 201* 192* 211*     Chemistry Recent Labs  Lab 03/07/22 1206 03/09/22 0131 03/09/22 0843 03/10/22 0141  NA 142 136  --  137  K 4.1 3.8  --  3.1*  CL 103 98  --  104  CO2 24 25  --  27  GLUCOSE 175* 193*  --  142*  BUN 23* 24*  --  16  CREATININE 1.25* 1.20  --  1.21  CALCIUM 9.8 9.8  --  8.3*  MG  --   --   2.1 1.9  GFRNONAA >60 >60  --  >60  ANIONGAP 15 13  --  6    Recent Labs  Lab 03/07/22 0540 03/07/22 1206 03/09/22 0131  PROT 7.4 7.8 8.5*  ALBUMIN 3.4* 4.2 4.4  AST _0 ALT _1 ALKPHOS 59 57 65  BILITOT 0.7 1.2 1.4*   Lipids  Recent Labs  Lab 03/10/22 0141  CHOL 183  TRIG 68  HDL 45  LDLCALC 124*  CHOLHDL 4.1    Hematology Recent Labs  Lab 03/07/22 1206 03/09/22 0131 03/10/22 0141  WBC 8.4 11.3* 8.8  RBC 4.57 5.20 4.38  HGB 14.1 16.0 13.7  HCT 42.4 47.3 39.5  MCV 92.8 91.0 90.2  MCH 30.9 30.8 31.3  MCHC 33.3 33.8 34.7  RDW 12.7 12.4 12.2  PLT 212 279 232   Thyroid No results for input(s): "TSH", "FREET4" in the last 168 hours.  BNP Recent Labs  Lab 03/09/22 0843  BNP 318.2*    DDimer No results for input(s): "DDIMER" in the last 168 hours.   Radiology/Studies:  ECHOCARDIOGRAM COMPLETE  Result Date: 03/10/2022    ECHOCARDIOGRAM REPORT   Patient Name:   DELTA DESHMUKH Date of Exam: 03/10/2022 Medical Rec #:  [id number]    Height:       70.0 in Accession #:    [id number]   Weight:       159.7 lb Date of Birth:  1965/08/19     BSA:          1.897 m Patient Age:    21 years     BP:           134/82 mmHg Patient Gender: M            HR:           86 bpm. Exam Location:  Inpatient Procedure: 2D Echo, Color Doppler and Cardiac Doppler Indications:    elevated troponin  History:        Patient has no prior history of Echocardiogram examinations.                 Chronic kidney disease; Risk Factors:Hypertension, Dyslipidemia                 and Diabetes.  Sonographer:    Johny Chess RDCS Referring Phys: [place]  1. Left ventricular ejection fraction, by estimation, is 25 to 30%. The left ventricle has severely decreased function. The left ventricle demonstrates global hypokinesis. There is mild concentric left ventricular hypertrophy. Left ventricular diastolic  parameters are consistent with Grade I diastolic dysfunction  (impaired relaxation).  2. Right ventricular systolic function is normal. The right ventricular size is normal. Tricuspid regurgitation signal is inadequate for assessing PA pressure.  3. Left atrial size was moderately dilated.  4. Right atrial size was mildly dilated.  5. The mitral valve is normal in structure. Mild mitral valve regurgitation. No evidence of mitral stenosis.  6. The aortic valve is tricuspid. Aortic valve regurgitation is not visualized.  7. The inferior vena cava is normal in size with greater than 50% respiratory variability, suggesting right atrial  pressure of 3 mmHg. Comparison(s): No prior Echocardiogram. FINDINGS  Left Ventricle: Left ventricular ejection fraction, by estimation, is 25 to 30%. The left ventricle has severely decreased function. The left ventricle demonstrates global hypokinesis. The left ventricular internal cavity size was normal in size. There is mild concentric left ventricular hypertrophy. Left ventricular diastolic parameters are consistent with Grade I diastolic dysfunction (impaired relaxation). Right Ventricle: The right ventricular size is normal. No increase in right ventricular wall thickness. Right ventricular systolic function is normal. Tricuspid regurgitation signal is inadequate for assessing PA pressure. Left Atrium: Left atrial size was moderately dilated. Right Atrium: Right atrial size was mildly dilated. Pericardium: There is no evidence of pericardial effusion. Mitral Valve: The mitral valve is normal in structure. Mild mitral valve regurgitation. No evidence of mitral valve stenosis. Tricuspid Valve: The tricuspid valve is normal in structure. Tricuspid valve regurgitation is not demonstrated. No evidence of tricuspid stenosis. Aortic Valve: The aortic valve is tricuspid. Aortic valve regurgitation is not visualized. Pulmonic Valve: The pulmonic valve was normal in structure. Pulmonic valve regurgitation is trivial. Aorta: The aortic root and  ascending aorta are structurally normal, with no evidence of dilitation. Venous: The inferior vena cava is normal in size with greater than 50% respiratory variability, suggesting right atrial pressure of 3 mmHg. IAS/Shunts: No atrial level shunt detected by color flow Doppler.  LEFT VENTRICLE PLAX 2D LVIDd:         5.40 cm      Diastology LVIDs:         4.90 cm      LV e' medial:    5.66 cm/s LV PW:         1.10 cm      LV E/e' medial:  13.5 LV IVS:        1.10 cm      LV e' lateral:   7.18 cm/s LVOT diam:     2.30 cm      LV E/e' lateral: 10.6 LV SV:         39 LV SV Index:   20 LVOT Area:     4.15 cm  LV Volumes (MOD) LV vol d, MOD A4C: 119.0 ml LV vol s, MOD A4C: 83.5 ml LV SV MOD A4C:     119.0 ml RIGHT VENTRICLE            IVC RV Basal diam:  2.90 cm    IVC diam: 1.40 cm RV S prime:     9.36 cm/s TAPSE (M-mode): 1.8 cm LEFT ATRIUM             Index        RIGHT ATRIUM           Index LA diam:        4.70 cm 2.48 cm/m   RA Area:     16.10 cm LA Vol (A2C):   83.6 ml 44.07 ml/m  RA Volume:   44.40 ml  23.41 ml/m LA Vol (A4C):   77.1 ml 40.64 ml/m LA Biplane Vol: 81.9 ml 43.17 ml/m  AORTIC VALVE LVOT Vmax:   57.40 cm/s LVOT Vmean:  35.100 cm/s LVOT VTI:    0.094 m  AORTA Ao Root diam: 3.00 cm Ao Asc diam:  3.30 cm MITRAL VALVE MV Area (PHT): 3.91 cm       SHUNTS MV Decel Time: 194 msec       Systemic VTI:  0.09 m MR Peak grad:    99.2 mmHg  Systemic Diam: 2.30 cm MR Mean grad:    63.0 mmHg MR Vmax:         498.00 cm/s MR Vmean:        371.0 cm/s MR PISA:         1.01 cm MR PISA Eff ROA: 8 mm MR PISA Radius:  0.40 cm MV E velocity: 76.30 cm/s MV A velocity: 50.10 cm/s MV E/A ratio:  1.52 Rudean Haskell MD Electronically signed by Rudean Haskell MD Signature Date/Time: 03/10/2022/4:07:13 PM    Final    CT ABDOMEN PELVIS W CONTRAST  Result Date: 03/07/2022 CLINICAL DATA:  Acute generalized abdominal pain, nausea, vomiting. EXAM: CT ABDOMEN AND PELVIS WITH CONTRAST TECHNIQUE: Multidetector  CT imaging of the abdomen and pelvis was performed using the standard protocol following bolus administration of intravenous contrast. RADIATION DOSE REDUCTION: This exam was performed according to the departmental dose-optimization program which includes automated exposure control, adjustment of the mA and/or kV according to patient size and/or use of iterative reconstruction technique. CONTRAST:  9m OMNIPAQUE IOHEXOL 300 MG/ML  SOLN COMPARISON:  April 26, 2021. FINDINGS: Lower chest: No acute abnormality. Hepatobiliary: No focal liver abnormality is seen. No gallstones, gallbladder wall thickening, or biliary dilatation. Pancreas: Unremarkable. No pancreatic ductal dilatation or surrounding inflammatory changes. Spleen: Normal in size without focal abnormality. Adrenals/Urinary Tract: Adrenal glands are unremarkable. Kidneys are normal, without renal calculi, focal lesion, or hydronephrosis. Bladder is unremarkable. Stomach/Bowel: Stomach is within normal limits. Appendix appears normal. There is no evidence of bowel obstruction. However, substantial wall thickening is seen involving proximal jejunum concerning for enteritis. Vascular/Lymphatic: No significant vascular findings are present. No enlarged abdominal or pelvic lymph nodes. Reproductive: Prostate is unremarkable. Other: No abdominal wall hernia or abnormality. Small amount of free fluid is noted in the right pericolic gutter and posterior pelvis which may be related to enteritis. Musculoskeletal: No acute or significant osseous findings. IMPRESSION: Wall thickening of proximal jejunum is noted concerning for enteritis. Minimal amount of free fluid is noted in the right pericolic gutter and posterior pelvis which may be secondary to inflammation. Electronically Signed   By: JMarijo ConceptionM.D.   On: 03/07/2022 17:19     Assessment and Plan:   Cardiomyopathy: newly diagnosed, possibly hypertensive. Has multiple risk factors for CAD. He will need  an ischemia evaluation at some point. He is euvolemic and well-compensated.       - Continue carvedilol. Ok to start losartan, I think he will tolerate transition to entresto fine but had a dose of lisinopril this AM. Start farxiga and spironolactone.  Hypertension: Can DC amlodipine in order to push doses of GDMT drugs. Enteritis: resolved DM: needs improved control   Risk Assessment/Risk Scores:   New York Heart Association (NYHA) Functional Class NYHA Class I-II    For questions or updates, please contact [place] F[place]Please consult [email or web address] for contact info under    Signed, KMargie Billet PA-C  03/10/2022 4:20 PM

## 2022-03-11 ENCOUNTER — Encounter (HOSPITAL_COMMUNITY)
Admission: EM | Disposition: A | Discharge status: Home / Self Care | Payer: Self-pay | Source: Home / Self Care | Attending: Family Medicine

## 2022-03-11 ENCOUNTER — Other Ambulatory Visit (HOSPITAL_COMMUNITY): Payer: Self-pay

## 2022-03-11 ENCOUNTER — Ambulatory Visit: Payer: 59 | Admitting: Podiatry

## 2022-03-11 DIAGNOSIS — Z823 Family history of stroke: Secondary | ICD-10-CM | POA: Diagnosis not present

## 2022-03-11 DIAGNOSIS — Z20822 Contact with and (suspected) exposure to covid-19: Secondary | ICD-10-CM | POA: Diagnosis present

## 2022-03-11 DIAGNOSIS — Z794 Long term (current) use of insulin: Secondary | ICD-10-CM | POA: Diagnosis not present

## 2022-03-11 DIAGNOSIS — T465X6A Underdosing of other antihypertensive drugs, initial encounter: Secondary | ICD-10-CM | POA: Diagnosis present

## 2022-03-11 DIAGNOSIS — Z7984 Long term (current) use of oral hypoglycemic drugs: Secondary | ICD-10-CM | POA: Diagnosis not present

## 2022-03-11 DIAGNOSIS — Z7982 Long term (current) use of aspirin: Secondary | ICD-10-CM | POA: Diagnosis not present

## 2022-03-11 DIAGNOSIS — J4520 Mild intermittent asthma, uncomplicated: Secondary | ICD-10-CM | POA: Diagnosis present

## 2022-03-11 DIAGNOSIS — I2489 Other forms of acute ischemic heart disease: Secondary | ICD-10-CM | POA: Diagnosis present

## 2022-03-11 DIAGNOSIS — Z833 Family history of diabetes mellitus: Secondary | ICD-10-CM | POA: Diagnosis not present

## 2022-03-11 DIAGNOSIS — Z91148 Patient's other noncompliance with medication regimen for other reason: Secondary | ICD-10-CM | POA: Diagnosis not present

## 2022-03-11 DIAGNOSIS — I5021 Acute systolic (congestive) heart failure: Secondary | ICD-10-CM | POA: Diagnosis present

## 2022-03-11 DIAGNOSIS — Z8249 Family history of ischemic heart disease and other diseases of the circulatory system: Secondary | ICD-10-CM | POA: Diagnosis not present

## 2022-03-11 DIAGNOSIS — I2510 Atherosclerotic heart disease of native coronary artery without angina pectoris: Secondary | ICD-10-CM | POA: Diagnosis present

## 2022-03-11 DIAGNOSIS — K529 Noninfective gastroenteritis and colitis, unspecified: Secondary | ICD-10-CM | POA: Diagnosis present

## 2022-03-11 DIAGNOSIS — E119 Type 2 diabetes mellitus without complications: Secondary | ICD-10-CM | POA: Diagnosis present

## 2022-03-11 DIAGNOSIS — I110 Hypertensive heart disease with heart failure: Secondary | ICD-10-CM | POA: Diagnosis present

## 2022-03-11 DIAGNOSIS — N179 Acute kidney failure, unspecified: Secondary | ICD-10-CM | POA: Diagnosis present

## 2022-03-11 DIAGNOSIS — I43 Cardiomyopathy in diseases classified elsewhere: Secondary | ICD-10-CM | POA: Diagnosis present

## 2022-03-11 DIAGNOSIS — E1165 Type 2 diabetes mellitus with hyperglycemia: Secondary | ICD-10-CM | POA: Diagnosis present

## 2022-03-11 DIAGNOSIS — R0602 Shortness of breath: Secondary | ICD-10-CM | POA: Diagnosis present

## 2022-03-11 DIAGNOSIS — E785 Hyperlipidemia, unspecified: Secondary | ICD-10-CM | POA: Diagnosis present

## 2022-03-11 DIAGNOSIS — Z79899 Other long term (current) drug therapy: Secondary | ICD-10-CM | POA: Diagnosis not present

## 2022-03-11 DIAGNOSIS — Z86718 Personal history of other venous thrombosis and embolism: Secondary | ICD-10-CM | POA: Diagnosis not present

## 2022-03-11 DIAGNOSIS — I509 Heart failure, unspecified: Secondary | ICD-10-CM

## 2022-03-11 DIAGNOSIS — E860 Dehydration: Secondary | ICD-10-CM | POA: Diagnosis present

## 2022-03-11 HISTORY — PX: RIGHT/LEFT HEART CATH AND CORONARY ANGIOGRAPHY: CATH118266

## 2022-03-11 LAB — BASIC METABOLIC PANEL
Anion gap: 6 (ref 5–15)
BUN: 11 mg/dL (ref 6–20)
CO2: 25 mmol/L (ref 22–32)
Calcium: 8.3 mg/dL — ABNORMAL LOW (ref 8.9–10.3)
Chloride: 105 mmol/L (ref 98–111)
Creatinine, Ser: 1.19 mg/dL (ref 0.61–1.24)
GFR, Estimated: >60 mL/min (ref >60)
Glucose, Bld: 171 mg/dL — ABNORMAL HIGH (ref 70–99)
Potassium: 3.9 mmol/L (ref 3.5–5.1)
Sodium: 136 mmol/L (ref 135–145)

## 2022-03-11 LAB — POCT I-STAT 7, (LYTES, BLD GAS, ICA,H+H)
Acid-base deficit: 1 mmol/L (ref 0.0–2.0)
Bicarbonate: 23.4 mmol/L (ref 20.0–28.0)
Calcium, Ion: 1.19 mmol/L (ref 1.15–1.40)
HCT: 39 % (ref 39.0–52.0)
Hemoglobin: 13.3 g/dL (ref 13.0–17.0)
O2 Saturation: 94 %
Potassium: 3.7 mmol/L (ref 3.5–5.1)
Sodium: 139 mmol/L (ref 135–145)
TCO2: 25 mmol/L (ref 22–32)
pCO2 arterial: 39.3 mmHg (ref 32–48)
pH, Arterial: 7.383 (ref 7.35–7.45)
pO2, Arterial: 71 mmHg — ABNORMAL LOW (ref 83–108)

## 2022-03-11 LAB — POCT I-STAT EG7
Acid-Base Excess: 0 mmol/L (ref 0.0–2.0)
Acid-Base Excess: 0 mmol/L (ref 0.0–2.0)
Bicarbonate: 25.2 mmol/L (ref 20.0–28.0)
Bicarbonate: 26 mmol/L (ref 20.0–28.0)
Calcium, Ion: 1.2 mmol/L (ref 1.15–1.40)
Calcium, Ion: 1.22 mmol/L (ref 1.15–1.40)
HCT: 40 % (ref 39.0–52.0)
HCT: 41 % (ref 39.0–52.0)
Hemoglobin: 13.6 g/dL (ref 13.0–17.0)
Hemoglobin: 13.9 g/dL (ref 13.0–17.0)
O2 Saturation: 65 %
O2 Saturation: 66 %
Potassium: 3.7 mmol/L (ref 3.5–5.1)
Potassium: 3.7 mmol/L (ref 3.5–5.1)
Sodium: 139 mmol/L (ref 135–145)
Sodium: 140 mmol/L (ref 135–145)
TCO2: 26 mmol/L (ref 22–32)
TCO2: 27 mmol/L (ref 22–32)
pCO2, Ven: 42.4 mmHg — ABNORMAL LOW (ref 44–60)
pCO2, Ven: 44.4 mmHg (ref 44–60)
pH, Ven: 7.375 (ref 7.25–7.43)
pH, Ven: 7.382 (ref 7.25–7.43)
pO2, Ven: 34 mmHg (ref 32–45)
pO2, Ven: 35 mmHg (ref 32–45)

## 2022-03-11 LAB — GLUCOSE, CAPILLARY
Glucose-Capillary: 155 mg/dL — ABNORMAL HIGH (ref 70–99)
Glucose-Capillary: 103 mg/dL — ABNORMAL HIGH (ref 70–99)
Glucose-Capillary: 186 mg/dL — ABNORMAL HIGH (ref 70–99)

## 2022-03-11 LAB — HEMOGLOBIN A1C
Hgb A1c MFr Bld: 10.7 % — ABNORMAL HIGH (ref 4.8–5.6)
Mean Plasma Glucose: 260 mg/dL

## 2022-03-11 LAB — MAGNESIUM: Magnesium: 1.8 mg/dL (ref 1.7–2.4)

## 2022-03-11 SURGERY — RIGHT/LEFT HEART CATH AND CORONARY ANGIOGRAPHY
Anesthesia: LOCAL

## 2022-03-11 MED ORDER — VERAPAMIL HCL 2.5 MG/ML IV SOLN
INTRAVENOUS | Status: AC
  Filled 2022-03-11: qty 2

## 2022-03-11 MED ORDER — MIDAZOLAM HCL 2 MG/2ML IJ SOLN
INTRAMUSCULAR | Status: AC
  Filled 2022-03-11: qty 2

## 2022-03-11 MED ORDER — MAGNESIUM SULFATE 2 GM/50ML IV SOLN
2.0000 g | Freq: Once | INTRAVENOUS | Status: AC
  Administered 2022-03-11: 2 g via INTRAVENOUS
  Filled 2022-03-11: qty 50

## 2022-03-11 MED ORDER — SODIUM CHLORIDE 0.9 % IV SOLN: 250.0000 mL | INTRAVENOUS | Status: DC | PRN

## 2022-03-11 MED ORDER — FENTANYL CITRATE (PF) 100 MCG/2ML IJ SOLN
INTRAMUSCULAR | Status: AC
  Filled 2022-03-11: qty 2

## 2022-03-11 MED ORDER — HYDRALAZINE HCL 20 MG/ML IJ SOLN: 10.0000 mg | INTRAMUSCULAR | Status: AC | PRN

## 2022-03-11 MED ORDER — LIDOCAINE HCL (PF) 1 % IJ SOLN
INTRAMUSCULAR | Status: AC
  Filled 2022-03-11: qty 30

## 2022-03-11 MED ORDER — SODIUM CHLORIDE 0.9% FLUSH
3.0000 mL | Freq: Two times a day (BID) | INTRAVENOUS | Status: DC
  Administered 2022-03-12: 3 mL via INTRAVENOUS

## 2022-03-11 MED ORDER — SODIUM CHLORIDE 0.9% FLUSH
3.0000 mL | Freq: Two times a day (BID) | INTRAVENOUS | Status: DC
  Administered 2022-03-11: 3 mL via INTRAVENOUS

## 2022-03-11 MED ORDER — HEPARIN (PORCINE) IN NACL 1000-0.9 UT/500ML-% IV SOLN
INTRAVENOUS | Status: AC
  Filled 2022-03-11: qty 1000

## 2022-03-11 MED ORDER — ASPIRIN 81 MG PO CHEW
81.0000 mg | CHEWABLE_TABLET | ORAL | Status: AC
  Administered 2022-03-11: 81 mg via ORAL
  Filled 2022-03-11: qty 1

## 2022-03-11 MED ORDER — SODIUM CHLORIDE 0.9 % IV SOLN: INTRAVENOUS | Status: DC

## 2022-03-11 MED ORDER — VERAPAMIL HCL 2.5 MG/ML IV SOLN
INTRAVENOUS | Status: DC | PRN
  Administered 2022-03-11: 10 mL via INTRA_ARTERIAL

## 2022-03-11 MED ORDER — HEPARIN SODIUM (PORCINE) 1000 UNIT/ML IJ SOLN
INTRAMUSCULAR | Status: DC | PRN
  Administered 2022-03-11: 3500 [IU] via INTRAVENOUS

## 2022-03-11 MED ORDER — SODIUM CHLORIDE 0.9% FLUSH: 3.0000 mL | INTRAVENOUS | Status: DC | PRN

## 2022-03-11 MED ORDER — ASPIRIN 81 MG PO CHEW
81.0000 mg | CHEWABLE_TABLET | Freq: Every day | ORAL | Status: DC
  Administered 2022-03-12: 81 mg via ORAL
  Filled 2022-03-11: qty 1

## 2022-03-11 MED ORDER — ENOXAPARIN SODIUM 40 MG/0.4ML IJ SOSY
40.0000 mg | PREFILLED_SYRINGE | INTRAMUSCULAR | Status: DC
  Administered 2022-03-12: 40 mg via SUBCUTANEOUS
  Filled 2022-03-11: qty 0.4

## 2022-03-11 MED ORDER — MIDAZOLAM HCL 2 MG/2ML IJ SOLN
INTRAMUSCULAR | Status: DC | PRN
  Administered 2022-03-11: 1 mg via INTRAVENOUS

## 2022-03-11 MED ORDER — LIDOCAINE HCL (PF) 1 % IJ SOLN
INTRAMUSCULAR | Status: DC | PRN
  Administered 2022-03-11 (×2): 2 mL

## 2022-03-11 MED ORDER — IOHEXOL 350 MG/ML SOLN
INTRAVENOUS | Status: DC | PRN
  Administered 2022-03-11: 60 mL

## 2022-03-11 MED ORDER — HEPARIN SODIUM (PORCINE) 1000 UNIT/ML IJ SOLN
INTRAMUSCULAR | Status: AC
  Filled 2022-03-11: qty 10

## 2022-03-11 MED ORDER — HEPARIN (PORCINE) IN NACL 1000-0.9 UT/500ML-% IV SOLN
INTRAVENOUS | Status: DC | PRN
  Administered 2022-03-11 (×2): 500 mL

## 2022-03-11 MED ORDER — FENTANYL CITRATE (PF) 100 MCG/2ML IJ SOLN
INTRAMUSCULAR | Status: DC | PRN
  Administered 2022-03-11: 50 ug via INTRAVENOUS

## 2022-03-11 SURGICAL SUPPLY — 12 items
CATH OPTITORQUE TIG 4.0 5F (CATHETERS) IMPLANT
CATH SWAN GANZ 7F STRAIGHT (CATHETERS) IMPLANT
DEVICE RAD COMP TR BAND LRG (VASCULAR PRODUCTS) IMPLANT
GLIDESHEATH SLEND SS 6F .021 (SHEATH) IMPLANT
GLIDESHEATH SLENDER 7FR .021G (SHEATH) IMPLANT
GUIDEWIRE INQWIRE 1.5J.035X260 (WIRE) IMPLANT
INQWIRE 1.5J .035X260CM (WIRE) ×1
KIT HEART LEFT (KITS) ×1 IMPLANT
PACK CARDIAC CATHETERIZATION (CUSTOM PROCEDURE TRAY) ×1 IMPLANT
SHEATH PROBE COVER 6X72 (BAG) IMPLANT
TRANSDUCER W/STOPCOCK (MISCELLANEOUS) ×1 IMPLANT
TUBING CIL FLEX 10 FLL-RA (TUBING) ×1 IMPLANT

## 2022-03-11 NOTE — Progress Notes (Signed)
Rounding Note    Patient Name: John [name] Date of Encounter: 03/11/2022  [place] Cardiologist: None New  Subjective   No significant shortness of breath or chest pain newly discovered EF 25 to 30%  Inpatient Medications    Scheduled Meds:  atorvastatin  40 mg Oral Daily   carvedilol  25 mg Oral BID WC   dapagliflozin propanediol  10 mg Oral Daily   enoxaparin (LOVENOX) injection  40 mg Subcutaneous Q24H   insulin aspart  0-15 Units Subcutaneous TID WC   insulin aspart  0-5 Units Subcutaneous QHS   insulin glargine-yfgn  12 Units Subcutaneous QHS   losartan  25 mg Oral Daily   sodium chloride flush  3 mL Intravenous Q12H   spironolactone  12.5 mg Oral Daily   Continuous Infusions:  PRN Meds: acetaminophen **OR** acetaminophen, albuterol, hydrALAZINE, morphine injection, ondansetron **OR** ondansetron (ZOFRAN) IV, oxyCODONE   Vital Signs    Vitals:   03/10/22 1109 03/10/22 1620 03/10/22 1940 03/11/22 0454  BP: 134/82 (!) 133/111 126/80 (!) 136/102  Pulse: 93 96 81 88  Resp: 14 18 20 16   Temp: 97.9 F (36.6 C) 97.9 F (36.6 C) 97.7 F (36.5 C) 97.9 F (36.6 C)  TempSrc: Oral Oral Oral Oral  SpO2: 97% 96% 97% 100%  Weight:      Height:       No intake or output data in the 24 hours ending 03/11/22 0829    03/09/2022    4:07 PM 03/09/2022    1:06 AM 03/07/2022    5:30 AM  Last 3 Weights  Weight (lbs) 159 lb 11.2 oz 178 lb 178 lb  Weight (kg) 72.439 kg 80.74 kg 80.74 kg      Telemetry    No adverse arrhythmias- Personally Reviewed  ECG    No new sinus rhythm 99 bpm LVH repolarization abnormality- Personally Reviewed  Physical Exam   GEN: No acute distress.   Neck: No JVD Cardiac: RRR, no murmurs, rubs, or gallops.  Respiratory: Clear to auscultation bilaterally. GI: Soft, nontender, non-distended  MS: No edema; No deformity. Neuro:  Nonfocal  Psych: Normal affect   Labs    High Sensitivity Troponin:   Recent Labs  Lab  03/09/22 0843 03/09/22 1118 03/09/22 1752 03/09/22 2015  TROPONINIHS 183* 201* 192* 211*     Chemistry Recent Labs  Lab 03/07/22 0540 03/07/22 1206 03/09/22 0131 03/09/22 0843 03/10/22 0141  NA 141 142 136  --  137  K 4.1 4.1 3.8  --  3.1*  CL 106 103 98  --  104  CO2 25 24 25   --  27  GLUCOSE 214* 175* 193*  --  142*  BUN 22* 23* 24*  --  16  CREATININE 1.52* 1.25* 1.20  --  1.21  CALCIUM 9.4 9.8 9.8  --  8.3*  MG  --   --   --  2.1 1.9  PROT 7.4 7.8 8.5*  --   --   ALBUMIN 3.4* 4.2 4.4  --   --   AST 21 16 19   --   --   ALT 22 19 22   --   --   ALKPHOS 59 57 65  --   --   BILITOT 0.7 1.2 1.4*  --   --   GFRNONAA 54* >60 >60  --  >60  ANIONGAP 10 15 13   --  6    Lipids  Recent Labs  Lab 03/10/22 0141  CHOL  183  TRIG 68  HDL 45  LDLCALC 124*  CHOLHDL 4.1    Hematology Recent Labs  Lab 03/07/22 1206 03/09/22 0131 03/10/22 0141  WBC 8.4 11.3* 8.8  RBC 4.57 5.20 4.38  HGB 14.1 16.0 13.7  HCT 42.4 47.3 39.5  MCV 92.8 91.0 90.2  MCH 30.9 30.8 31.3  MCHC 33.3 33.8 34.7  RDW 12.7 12.4 12.2  PLT 212 279 232   Thyroid No results for input(s): "TSH", "FREET4" in the last 168 hours.  BNP Recent Labs  Lab 03/09/22 0843  BNP 318.2*    DDimer No results for input(s): "DDIMER" in the last 168 hours.   Radiology    ECHOCARDIOGRAM COMPLETE  Result Date: 03/10/2022    ECHOCARDIOGRAM REPORT   Patient Name:   John [name] Date of Exam: 03/10/2022 Medical Rec #:  [id number]    Height:       70.0 in Accession #:    [id number]   Weight:       159.7 lb Date of Birth:  02-12-1966     BSA:          1.897 m Patient Age:    56 years     BP:           134/82 mmHg Patient Gender: M            HR:           86 bpm. Exam Location:  Inpatient Procedure: 2D Echo, Color Doppler and Cardiac Doppler Indications:    elevated troponin  History:        Patient has no prior history of Echocardiogram examinations.                 Chronic kidney disease; Risk Factors:Hypertension,  Dyslipidemia                 and Diabetes.  Sonographer:    Johny Chess RDCS Referring Phys: [place]  1. Left ventricular ejection fraction, by estimation, is 25 to 30%. The left ventricle has severely decreased function. The left ventricle demonstrates global hypokinesis. There is mild concentric left ventricular hypertrophy. Left ventricular diastolic  parameters are consistent with Grade I diastolic dysfunction (impaired relaxation).  2. Right ventricular systolic function is normal. The right ventricular size is normal. Tricuspid regurgitation signal is inadequate for assessing PA pressure.  3. Left atrial size was moderately dilated.  4. Right atrial size was mildly dilated.  5. The mitral valve is normal in structure. Mild mitral valve regurgitation. No evidence of mitral stenosis.  6. The aortic valve is tricuspid. Aortic valve regurgitation is not visualized.  7. The inferior vena cava is normal in size with greater than 50% respiratory variability, suggesting right atrial pressure of 3 mmHg. Comparison(s): No prior Echocardiogram. FINDINGS  Left Ventricle: Left ventricular ejection fraction, by estimation, is 25 to 30%. The left ventricle has severely decreased function. The left ventricle demonstrates global hypokinesis. The left ventricular internal cavity size was normal in size. There is mild concentric left ventricular hypertrophy. Left ventricular diastolic parameters are consistent with Grade I diastolic dysfunction (impaired relaxation). Right Ventricle: The right ventricular size is normal. No increase in right ventricular wall thickness. Right ventricular systolic function is normal. Tricuspid regurgitation signal is inadequate for assessing PA pressure. Left Atrium: Left atrial size was moderately dilated. Right Atrium: Right atrial size was mildly dilated. Pericardium: There is no evidence of pericardial effusion. Mitral Valve: The mitral valve is normal in  structure. Mild mitral valve regurgitation. No evidence of mitral valve stenosis. Tricuspid Valve: The tricuspid valve is normal in structure. Tricuspid valve regurgitation is not demonstrated. No evidence of tricuspid stenosis. Aortic Valve: The aortic valve is tricuspid. Aortic valve regurgitation is not visualized. Pulmonic Valve: The pulmonic valve was normal in structure. Pulmonic valve regurgitation is trivial. Aorta: The aortic root and ascending aorta are structurally normal, with no evidence of dilitation. Venous: The inferior vena cava is normal in size with greater than 50% respiratory variability, suggesting right atrial pressure of 3 mmHg. IAS/Shunts: No atrial level shunt detected by color flow Doppler.  LEFT VENTRICLE PLAX 2D LVIDd:         5.40 cm      Diastology LVIDs:         4.90 cm      LV e' medial:    5.66 cm/s LV PW:         1.10 cm      LV E/e' medial:  13.5 LV IVS:        1.10 cm      LV e' lateral:   7.18 cm/s LVOT diam:     2.30 cm      LV E/e' lateral: 10.6 LV SV:         39 LV SV Index:   20 LVOT Area:     4.15 cm  LV Volumes (MOD) LV vol d, MOD A4C: 119.0 ml LV vol s, MOD A4C: 83.5 ml LV SV MOD A4C:     119.0 ml RIGHT VENTRICLE            IVC RV Basal diam:  2.90 cm    IVC diam: 1.40 cm RV S prime:     9.36 cm/s TAPSE (M-mode): 1.8 cm LEFT ATRIUM             Index        RIGHT ATRIUM           Index LA diam:        4.70 cm 2.48 cm/m   RA Area:     16.10 cm LA Vol (A2C):   83.6 ml 44.07 ml/m  RA Volume:   44.40 ml  23.41 ml/m LA Vol (A4C):   77.1 ml 40.64 ml/m LA Biplane Vol: 81.9 ml 43.17 ml/m  AORTIC VALVE LVOT Vmax:   57.40 cm/s LVOT Vmean:  35.100 cm/s LVOT VTI:    0.094 m  AORTA Ao Root diam: 3.00 cm Ao Asc diam:  3.30 cm MITRAL VALVE MV Area (PHT): 3.91 cm       SHUNTS MV Decel Time: 194 msec       Systemic VTI:  0.09 m MR Peak grad:    99.2 mmHg    Systemic Diam: 2.30 cm MR Mean grad:    63.0 mmHg MR Vmax:         498.00 cm/s MR Vmean:        371.0 cm/s MR PISA:          1.01 cm MR PISA Eff ROA: 8 mm MR PISA Radius:  0.40 cm MV E velocity: 76.30 cm/s MV A velocity: 50.10 cm/s MV E/A ratio:  1.52 Rudean Haskell MD Electronically signed by Rudean Haskell MD Signature Date/Time: 03/10/2022/4:07:13 PM    Final     Cardiac Studies   Echo 25 to 30% newly discovered reduction in EF.  Patient Profile     56 y.o. male With newly discovered cardiomyopathy possibly hypertensive with  multiple cardiac risk factors such as diabetes hypertension.  Assessment & Plan    Cardiomyopathy - Newly discovered EF 25 to 30%.  Will go ahead and proceed with cardiac catheterization.  Risks and benefits explained including stroke heart attack death renal impairment bleeding.  Willing to proceed.  He is NPO.  We will perform left and right heart catheterization given his cardiomyopathy. -Goal-directed medical therapy.  Farxiga and spironolactone were started.  Transitioning to [facility] this evening or tomorrow.  Had a dose of lisinopril on 03/10/2022 (waiting 36 hours).  Diabetes with hypertension - If necessary discontinuing amlodipine to push goal-directed medical therapy drugs.  Enteritis - Has resolved.  Discussed with his wife who used to work at [employer] in Facilities manager, currently at [employer].    For questions or updates, please contact [facility] Please consult [email or web address] for contact info under        Signed, Donato Schultz, MD  03/11/2022, 8:29 AM

## 2022-03-11 NOTE — H&P (View-Only) (Signed)
 Rounding Note    Patient Name: John [name] Date of Encounter: 03/11/2022  [facility] Cardiologist: None New  Subjective   No significant shortness of breath or chest pain newly discovered EF 25 to 30%  Inpatient Medications    Scheduled Meds:  atorvastatin  40 mg Oral Daily   carvedilol  25 mg Oral BID WC   dapagliflozin propanediol  10 mg Oral Daily   enoxaparin (LOVENOX) injection  40 mg Subcutaneous Q24H   insulin aspart  0-15 Units Subcutaneous TID WC   insulin aspart  0-5 Units Subcutaneous QHS   insulin glargine-yfgn  12 Units Subcutaneous QHS   losartan  25 mg Oral Daily   sodium chloride flush  3 mL Intravenous Q12H   spironolactone  12.5 mg Oral Daily   Continuous Infusions:  PRN Meds: acetaminophen **OR** acetaminophen, albuterol, hydrALAZINE, morphine injection, ondansetron **OR** ondansetron (ZOFRAN) IV, oxyCODONE   Vital Signs    Vitals:   03/10/22 1109 03/10/22 1620 03/10/22 1940 03/11/22 0454  BP: 134/82 (!) 133/111 126/80 (!) 136/102  Pulse: 93 96 81 88  Resp: 14 18 20 16  Temp: 97.9 F (36.6 C) 97.9 F (36.6 C) 97.7 F (36.5 C) 97.9 F (36.6 C)  TempSrc: Oral Oral Oral Oral  SpO2: 97% 96% 97% 100%  Weight:      Height:       No intake or output data in the 24 hours ending 03/11/22 0829    03/09/2022    4:07 PM 03/09/2022    1:06 AM 03/07/2022    5:30 AM  Last 3 Weights  Weight (lbs) 159 lb 11.2 oz 178 lb 178 lb  Weight (kg) 72.439 kg 80.74 kg 80.74 kg      Telemetry    No adverse arrhythmias- Personally Reviewed  ECG    No new sinus rhythm 99 bpm LVH repolarization abnormality- Personally Reviewed  Physical Exam   GEN: No acute distress.   Neck: No JVD Cardiac: RRR, no murmurs, rubs, or gallops.  Respiratory: Clear to auscultation bilaterally. GI: Soft, nontender, non-distended  MS: No edema; No deformity. Neuro:  Nonfocal  Psych: Normal affect   Labs    High Sensitivity Troponin:   Recent Labs  Lab  03/09/22 0843 03/09/22 1118 03/09/22 1752 03/09/22 2015  TROPONINIHS 183* 201* 192* 211*     Chemistry Recent Labs  Lab 03/07/22 0540 03/07/22 1206 03/09/22 0131 03/09/22 0843 03/10/22 0141  NA 141 142 136  --  137  K 4.1 4.1 3.8  --  3.1*  CL 106 103 98  --  104  CO2 25 24 25  --  27  GLUCOSE 214* 175* 193*  --  142*  BUN 22* 23* 24*  --  16  CREATININE 1.52* 1.25* 1.20  --  1.21  CALCIUM 9.4 9.8 9.8  --  8.3*  MG  --   --   --  2.1 1.9  PROT 7.4 7.8 8.5*  --   --   ALBUMIN 3.4* 4.2 4.4  --   --   AST 21 16 19  --   --   ALT 22 19 22  --   --   ALKPHOS 59 57 65  --   --   BILITOT 0.7 1.2 1.4*  --   --   GFRNONAA 54* >60 >60  --  >60  ANIONGAP 10 15 13  --  6    Lipids  Recent Labs  Lab 03/10/22 0141  CHOL   183  TRIG 68  HDL 45  LDLCALC 124*  CHOLHDL 4.1    Hematology Recent Labs  Lab 03/07/22 1206 03/09/22 0131 03/10/22 0141  WBC 8.4 11.3* 8.8  RBC 4.57 5.20 4.38  HGB 14.1 16.0 13.7  HCT 42.4 47.3 39.5  MCV 92.8 91.0 90.2  MCH 30.9 30.8 31.3  MCHC 33.3 33.8 34.7  RDW 12.7 12.4 12.2  PLT 212 279 232   Thyroid No results for input(s): "TSH", "FREET4" in the last 168 hours.  BNP Recent Labs  Lab 03/09/22 0843  BNP 318.2*    DDimer No results for input(s): "DDIMER" in the last 168 hours.   Radiology    ECHOCARDIOGRAM COMPLETE  Result Date: 03/10/2022    ECHOCARDIOGRAM REPORT   Patient Name:   John [name] Date of Exam: 03/10/2022 Medical Rec #:  [id number]    Height:       70.0 in Accession #:    [id number]   Weight:       159.7 lb Date of Birth:  12/10/1965     BSA:          1.897 m Patient Age:    56 years     BP:           134/82 mmHg Patient Gender: M            HR:           86 bpm. Exam Location:  Inpatient Procedure: 2D Echo, Color Doppler and Cardiac Doppler Indications:    elevated troponin  History:        Patient has no prior history of Echocardiogram examinations.                 Chronic kidney disease; Risk Factors:Hypertension,  Dyslipidemia                 and Diabetes.  Sonographer:    Lauren Pennington RDCS Referring Phys: [id number] CALDWELL POWELL JR IMPRESSIONS  1. Left ventricular ejection fraction, by estimation, is 25 to 30%. The left ventricle has severely decreased function. The left ventricle demonstrates global hypokinesis. There is mild concentric left ventricular hypertrophy. Left ventricular diastolic  parameters are consistent with Grade I diastolic dysfunction (impaired relaxation).  2. Right ventricular systolic function is normal. The right ventricular size is normal. Tricuspid regurgitation signal is inadequate for assessing PA pressure.  3. Left atrial size was moderately dilated.  4. Right atrial size was mildly dilated.  5. The mitral valve is normal in structure. Mild mitral valve regurgitation. No evidence of mitral stenosis.  6. The aortic valve is tricuspid. Aortic valve regurgitation is not visualized.  7. The inferior vena cava is normal in size with greater than 50% respiratory variability, suggesting right atrial pressure of 3 mmHg. Comparison(s): No prior Echocardiogram. FINDINGS  Left Ventricle: Left ventricular ejection fraction, by estimation, is 25 to 30%. The left ventricle has severely decreased function. The left ventricle demonstrates global hypokinesis. The left ventricular internal cavity size was normal in size. There is mild concentric left ventricular hypertrophy. Left ventricular diastolic parameters are consistent with Grade I diastolic dysfunction (impaired relaxation). Right Ventricle: The right ventricular size is normal. No increase in right ventricular wall thickness. Right ventricular systolic function is normal. Tricuspid regurgitation signal is inadequate for assessing PA pressure. Left Atrium: Left atrial size was moderately dilated. Right Atrium: Right atrial size was mildly dilated. Pericardium: There is no evidence of pericardial effusion. Mitral Valve: The mitral valve is normal in    structure. Mild mitral valve regurgitation. No evidence of mitral valve stenosis. Tricuspid Valve: The tricuspid valve is normal in structure. Tricuspid valve regurgitation is not demonstrated. No evidence of tricuspid stenosis. Aortic Valve: The aortic valve is tricuspid. Aortic valve regurgitation is not visualized. Pulmonic Valve: The pulmonic valve was normal in structure. Pulmonic valve regurgitation is trivial. Aorta: The aortic root and ascending aorta are structurally normal, with no evidence of dilitation. Venous: The inferior vena cava is normal in size with greater than 50% respiratory variability, suggesting right atrial pressure of 3 mmHg. IAS/Shunts: No atrial level shunt detected by color flow Doppler.  LEFT VENTRICLE PLAX 2D LVIDd:         5.40 cm      Diastology LVIDs:         4.90 cm      LV e' medial:    5.66 cm/s LV PW:         1.10 cm      LV E/e' medial:  13.5 LV IVS:        1.10 cm      LV e' lateral:   7.18 cm/s LVOT diam:     2.30 cm      LV E/e' lateral: 10.6 LV SV:         39 LV SV Index:   20 LVOT Area:     4.15 cm  LV Volumes (MOD) LV vol d, MOD A4C: 119.0 ml LV vol s, MOD A4C: 83.5 ml LV SV MOD A4C:     119.0 ml RIGHT VENTRICLE            IVC RV Basal diam:  2.90 cm    IVC diam: 1.40 cm RV S prime:     9.36 cm/s TAPSE (M-mode): 1.8 cm LEFT ATRIUM             Index        RIGHT ATRIUM           Index LA diam:        4.70 cm 2.48 cm/m   RA Area:     16.10 cm LA Vol (A2C):   83.6 ml 44.07 ml/m  RA Volume:   44.40 ml  23.41 ml/m LA Vol (A4C):   77.1 ml 40.64 ml/m LA Biplane Vol: 81.9 ml 43.17 ml/m  AORTIC VALVE LVOT Vmax:   57.40 cm/s LVOT Vmean:  35.100 cm/s LVOT VTI:    0.094 m  AORTA Ao Root diam: 3.00 cm Ao Asc diam:  3.30 cm MITRAL VALVE MV Area (PHT): 3.91 cm       SHUNTS MV Decel Time: 194 msec       Systemic VTI:  0.09 m MR Peak grad:    99.2 mmHg    Systemic Diam: 2.30 cm MR Mean grad:    63.0 mmHg MR Vmax:         498.00 cm/s MR Vmean:        371.0 cm/s MR PISA:          1.01 cm MR PISA Eff ROA: 8 mm MR PISA Radius:  0.40 cm MV E velocity: 76.30 cm/s MV A velocity: 50.10 cm/s MV E/A ratio:  1.52 Mahesh Chandrasekhar MD Electronically signed by Mahesh Chandrasekhar MD Signature Date/Time: 03/10/2022/4:07:13 PM    Final     Cardiac Studies   Echo 25 to 30% newly discovered reduction in EF.  Patient Profile     56 y.o. male With newly discovered cardiomyopathy possibly hypertensive with   multiple cardiac risk factors such as diabetes hypertension.  Assessment & Plan    Cardiomyopathy - Newly discovered EF 25 to 30%.  Will go ahead and proceed with cardiac catheterization.  Risks and benefits explained including stroke heart attack death renal impairment bleeding.  Willing to proceed.  He is NPO.  We will perform left and right heart catheterization given his cardiomyopathy. -Goal-directed medical therapy.  Farxiga and spironolactone were started.  Transitioning to [facility] this evening or tomorrow.  Had a dose of lisinopril on 03/10/2022 (waiting 36 hours).  Diabetes with hypertension - If necessary discontinuing amlodipine to push goal-directed medical therapy drugs.  Enteritis - Has resolved.  Discussed with his wife who used to work at [employer] in Facilities manager, currently at [employer].    For questions or updates, please contact [facility] Please consult [email or web address] for contact info under        Signed, Donato Schultz, MD  03/11/2022, 8:29 AM

## 2022-03-11 NOTE — Inpatient Diabetes Management (Signed)
Inpatient Diabetes Program Recommendations  AACE/ADA: New Consensus Statement on Inpatient Glycemic Control (2015)  Target Ranges:  Prepandial:   less than 140 mg/dL      Peak postprandial:   less than 180 mg/dL (1-2 hours)      Critically ill patients:  140 - 180 mg/dL   Lab Results  Component Value Date   GLUCAP 103 (H) 03/11/2022   HGBA1C 10.7 (H) 03/10/2022    Review of Glycemic Control  Latest Reference Range & Units 03/10/22 08:51 03/10/22 11:07 03/10/22 16:19 03/10/22 21:22 03/11/22 07:55 03/11/22 11:48  Glucose-Capillary 70 - 99 mg/dL 158 (H) 116 (H) 219 (H) 199 (H) 155 (H) 103 (H)   Diabetes history: DM 2 Outpatient Diabetes medications: Metformin 1000 mg bid, Farxiga 10 mg Daily, Semglee 12 units Daily Current orders for Inpatient glycemic control:  Semglee 12 units qhs Novolog 0-15 units tid + hs Farxiga 10 mg Daily  Inpatient Diabetes Program Recommendations:    Spoke with pt and wife at bedside regarding A1c of 10.7% and glucose control at home. Pt reports Seeing Dr. Riki Sheer at [facility]. Pt reports taking the oral medications Farxiga and metformin consistently however, he would miss his insulin dose a couple of times a week. Discussed lifestyle modifications and importance of glucose control. Discussed current A1c. Reviewed glucose and A1c goals. Pt is interested in the [facility] for glucose monitoring at time of d/c. Pt has been referred to 2 different Endocrinology offices however has not received a call back for scheduling. Encouraged pt to follow up with medication changes to Dr. Nani Ravens and also encouraged them to call the Endocrinology office they were referred to to follow up on the referral scheduling.    D/c: -   Freestyle Libre 3 covered under insurance with a $74.99 cost unless deductible has been met  Thanks,  Tama Headings RN, MSN, BC-ADM Inpatient Diabetes Coordinator Team Pager [phone]  (8a-5p)

## 2022-03-11 NOTE — Progress Notes (Signed)
PROGRESS NOTE    John [name]  [id number] DOB: 1965-05-28 DOA: 03/09/2022 PCP: [name], DO  Chief Complaint  Patient presents with   Abdominal Pain    Brief Narrative:  John [name] is John [name] 56 y.o. male with medical history significant of asthma, DM, and HTN presenting with abdominal pain and n/v.   He reports nausea and abdominal cramping.  Symptoms started 3-4 days ago.  "He's been vomiting terribly", 6-7 times John [name] day.  Lost 20 pounds in 4 days.  He has been to the ER twice.  He works at [employer], was at the ER about [name] week ago due to asthma, went to the bathroom after someone in the ER waiting room who had GI illness.  He did have diarrhea the first couple of days, none recently, last about 2 days ago.    He hasn't been able to keep anything down. No URI symptoms since the asthma resolved.  He has not taken his home medications since his last ER visit.  No insulin for maybe 3 days.  He reports that he is now feeling hungry.   ER Course:  [facility] transfer:   He was seen at [facility] on 12/8 for cough, noted to have significant HTN and was encouraged to take home BP medications. He was given John [name] steroid injection and discharged. He returned to the ER on 12/10 with SOB. He again had HTN and reported not taking his BP medications at home, was given IV hydralazine and home meds. He also reported being out of metformin and this was refilled along with albuterol. He returned overnight to [facility] with n/v. Has troponin bump, ?demand from dehydration. CT had shown enteritis and he is tender. Likely needs hydration, pain control, DM medications, trending troponin.    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  Needs ischemic evaluation, will defer decision for timing of this to cardiology.  LHC with non obstructive CAD, consistent with nonischemic cardiomyopathy, most severe lesion is 60-70% stenosis involving small, non dominant RCA. 20-30% proximal LAD stenosis as well as mild luminal irregularities in the ramus intermedius.  Recommending medical therapy and risk factor modification to prevent progression of CAD.  Recommending escalation of GDMT as tolerated. Coreg, spironolactone, farxiga.  Will start losartan with plans to eventually transition to entresto. Cardiology c/s, appreciate recs - will defer to cards timing of ischemic evaluation  Elevated Troponin Never any CP and no CP with exertion at baseline Echo as above Follow A1c 10.7, lipid panel (LDL 124) Ischemic evaluation per cards Will increase lipitor to 40 mg  Hypertension Meds as above Stopping amlodipine  Enteritis Seems to be resolved at this time Wall thickening of proximal jejunum concerning for enteritis on 12/14 CT  T2DM Follow repeat A1c 10.7 Holding metformin He's on farxiga at home Continue basal insulin   HLD Continue atorvastatin -> increase to 40 mg with LDL 124  Prolonged Qtc Avoid qt prolonging meds Repeat EKG pending Replace lytes    DVT prophylaxis: lovenox Code Status: full Family Communication: wife at bedside Disposition:   Status is: Observation The patient will require care spanning >  2 midnights and should be moved to inpatient because: pending further cards evaluation for new onset HF   Consultants:  cardiology  Procedures:  Echo IMPRESSIONS     1. Left ventricular ejection fraction, by estimation, is 25 to 30%. The  left ventricle has severely decreased function. The left ventricle  demonstrates global hypokinesis. There is mild concentric left ventricular  hypertrophy. Left  ventricular diastolic   parameters are consistent with Grade I diastolic dysfunction (impaired  relaxation).   2. Right ventricular systolic function is normal. The right ventricular  size is normal. Tricuspid regurgitation signal is inadequate for assessing  PA pressure.   3. Left atrial size was moderately dilated.   4. Right atrial size was mildly dilated.   5. The mitral valve is normal in structure. Mild mitral valve  regurgitation. No evidence of mitral stenosis.   6. The aortic valve is tricuspid. Aortic valve regurgitation is not  visualized.   7. The inferior vena cava is normal in size with greater than 50%  respiratory variability, suggesting right atrial pressure of 3 mmHg.   Comparison(s): No prior Echocardiogram.   Antimicrobials:  Anti-infectives (From admission, onward)    None       Subjective: No complaints  Objective: Vitals:   03/10/22 0335 03/10/22 0835 03/10/22 1109 03/10/22 1620  BP: (!) 143/99 (!) 154/101 134/82 (!) 133/111  Pulse: 88 87 93 96  Resp: 18 15 14 18   Temp: 97.6 F (36.4 C) 98.3 F (36.8 C) 97.9 F (36.6 C) 97.9 F (36.6 C)  TempSrc: Oral Oral Oral Oral  SpO2: 97% 96% 97% 96%  Weight:      Height:        Intake/Output Summary (Last 24 hours) at 03/10/2022 1715 Last data filed at 03/09/2022 2143 Gross per 24 hour  Intake 815.81 ml  Output --  Net 815.81 ml   Filed Weights   03/09/22 0106 03/09/22 1607  Weight: 80.7 kg 72.4 kg    Examination:  General: No acute distress. Cardiovascular: RRR Lungs: unlabored Abdomen: Soft, nontender, nondistended  Neurological: Alert and oriented 3. Moves all extremities 4. Cranial nerves II through XII grossly intact. Extremities: No clubbing or cyanosis. No edema.   Data Reviewed: I have personally reviewed following labs and imaging studies  CBC: Recent Labs  Lab 03/07/22 0540 03/07/22 1206 03/09/22 0131 03/10/22 0141  WBC 9.9 8.4 11.3* 8.8  NEUTROABS 7.1  --   --   --    HGB 13.4 14.1 16.0 13.7  HCT 39.4 42.4 47.3 39.5  MCV 93.1 92.8 91.0 90.2  PLT 248 212 279 [id number]    Basic Metabolic Panel: Recent Labs  Lab 03/07/22 0540 03/07/22 1206 03/09/22 0131 03/09/22 0843 03/10/22 0141  NA 141 142 136  --  137  K 4.1 4.1 3.8  --  3.1*  CL 106 103 98  --  104  CO2 25 24 25   --  27  GLUCOSE 214* 175* 193*  --  142*  BUN 22* 23* 24*  --  16  CREATININE 1.52* 1.25* 1.20  --  1.21  CALCIUM 9.4 9.8 9.8  --  8.3*  MG  --   --   --  2.1 1.9  PHOS  --   --   --  3.5  --     GFR: Estimated Creatinine Clearance: 70.6 mL/min (by C-G formula based on SCr of 1.21 mg/dL).  Liver Function Tests: Recent Labs  Lab 03/07/22 0540 03/07/22 1206 03/09/22 0131  AST 21 16 19   ALT 22 19 22   ALKPHOS 59 57 65  BILITOT 0.7 1.2 1.4*  PROT 7.4 7.8 8.5*  ALBUMIN 3.4* 4.2 4.4    CBG: Recent Labs  Lab 03/10/22 0003 03/10/22 0424 03/10/22 0851 03/10/22 1107 03/10/22 1619  GLUCAP 154* 162* 158* 116* 219*     Recent Results (from the past 240 hour(s))  Resp Panel by RT-PCR (Flu Jahziel Sinn&B, Covid) Anterior Nasal Swab     Status: None   Collection Time: 03/03/22 12:44 AM   Specimen: Anterior Nasal Swab  Result Value Ref Range Status   SARS Coronavirus 2 by RT PCR NEGATIVE NEGATIVE Final    Comment: (NOTE) SARS-CoV-2 target nucleic acids are NOT DETECTED.  The SARS-CoV-2 RNA is generally detectable in upper respiratory specimens during the acute phase of infection. The lowest concentration of SARS-CoV-2 viral copies this assay can detect is 138 copies/mL. Jahmani Staup negative result does not preclude SARS-Cov-2 infection and should not be used as the sole basis for treatment or other patient management decisions. Demetrion Wesby negative result may occur with  improper specimen collection/handling, submission of specimen other than nasopharyngeal swab, presence of viral mutation(s) within the areas targeted by this assay, and inadequate number of viral copies(<138 copies/mL). Marializ Ferrebee negative  result must be combined with clinical observations, patient history, and epidemiological information. The expected result is Negative.  Fact Sheet for Patients:  EntrepreneurPulse.com.au  Fact Sheet for Healthcare Providers:  IncredibleEmployment.be  This test is no t yet approved or cleared by the [place] FDA and  has been authorized for detection and/or diagnosis of SARS-CoV-2 by FDA under an Emergency Use Authorization (EUA). This EUA will remain  in effect (meaning this test can be used) for the duration of the COVID-19 declaration under Section 564(b)(1) of the Act, 21 U.S.C.section 360bbb-3(b)(1), unless the authorization is terminated  or revoked sooner.       Influenza Wednesday Ericsson by PCR NEGATIVE NEGATIVE Final   Influenza B by PCR NEGATIVE NEGATIVE Final    Comment: (NOTE) The Xpert Xpress SARS-CoV-2/FLU/RSV plus assay is intended as an aid in the diagnosis of influenza from Nasopharyngeal swab specimens and should not be used as Kannen Moxey sole basis for treatment. Nasal washings and aspirates are unacceptable for Xpert Xpress SARS-CoV-2/FLU/RSV testing.  Fact Sheet for Patients: EntrepreneurPulse.com.au  Fact Sheet for Healthcare Providers: IncredibleEmployment.be  This test is not yet approved or cleared by the [place] FDA and has been authorized for detection and/or diagnosis of SARS-CoV-2 by FDA under an Emergency Use Authorization (EUA). This EUA will remain in effect (meaning this test can be used) for the duration of the COVID-19 declaration under Section 564(b)(1) of the Act, 21 U.S.C. section 360bbb-3(b)(1), unless the authorization is terminated or revoked.  Performed at [facility], [place] [address], [place], [place] [address]   Resp panel by RT-PCR (RSV, Flu Hilja Kintzel&B, Covid) Anterior Nasal Swab     Status: None   Collection Time: 03/09/22  8:43 AM   Specimen: Anterior Nasal Swab   Result Value Ref Range Status   SARS Coronavirus 2 by RT PCR NEGATIVE NEGATIVE Final    Comment: (NOTE) SARS-CoV-2 target nucleic acids are NOT DETECTED.  The SARS-CoV-2 RNA is generally detectable in upper respiratory specimens during the acute phase of infection. The lowest concentration of SARS-CoV-2 viral copies this assay can detect is 138 copies/mL. Salimata Christenson negative result does not preclude SARS-Cov-2 infection and should not be used as the sole basis for treatment or other  patient management decisions. Jalena Vanderlinden negative result may occur with  improper specimen collection/handling, submission of specimen other than nasopharyngeal swab, presence of viral mutation(s) within the areas targeted by this assay, and inadequate number of viral copies(<138 copies/mL). Edithe Dobbin negative result must be combined with clinical observations, patient history, and epidemiological information. The expected result is Negative.  Fact Sheet for Patients:  EntrepreneurPulse.com.au  Fact Sheet for Healthcare Providers:  IncredibleEmployment.be  This test is no t yet approved or cleared by the [place] FDA and  has been authorized for detection and/or diagnosis of SARS-CoV-2 by FDA under an Emergency Use Authorization (EUA). This EUA will remain  in effect (meaning this test can be used) for the duration of the COVID-19 declaration under Section 564(b)(1) of the Act, 21 U.S.C.section 360bbb-3(b)(1), unless the authorization is terminated  or revoked sooner.       Influenza Elizah Lydon by PCR NEGATIVE NEGATIVE Final   Influenza B by PCR NEGATIVE NEGATIVE Final    Comment: (NOTE) The Xpert Xpress SARS-CoV-2/FLU/RSV plus assay is intended as an aid in the diagnosis of influenza from Nasopharyngeal swab specimens and should not be used as Doaa Kendzierski sole basis for treatment. Nasal washings and aspirates are unacceptable for Xpert Xpress SARS-CoV-2/FLU/RSV testing.  Fact Sheet for  Patients: EntrepreneurPulse.com.au  Fact Sheet for Healthcare Providers: IncredibleEmployment.be  This test is not yet approved or cleared by the [place] FDA and has been authorized for detection and/or diagnosis of SARS-CoV-2 by FDA under an Emergency Use Authorization (EUA). This EUA will remain in effect (meaning this test can be used) for the duration of the COVID-19 declaration under Section 564(b)(1) of the Act, 21 U.S.C. section 360bbb-3(b)(1), unless the authorization is terminated or revoked.     Resp Syncytial Virus by PCR NEGATIVE NEGATIVE Final    Comment: (NOTE) Fact Sheet for Patients: EntrepreneurPulse.com.au  Fact Sheet for Healthcare Providers: IncredibleEmployment.be  This test is not yet approved or cleared by the [place] FDA and has been authorized for detection and/or diagnosis of SARS-CoV-2 by FDA under an Emergency Use Authorization (EUA). This EUA will remain in effect (meaning this test can be used) for the duration of the COVID-19 declaration under Section 564(b)(1) of the Act, 21 U.S.C. section 360bbb-3(b)(1), unless the authorization is terminated or revoked.  Performed at [facility], [address], [place], [place] [address]          Radiology Studies: ECHOCARDIOGRAM COMPLETE  Result Date: 03/10/2022    ECHOCARDIOGRAM REPORT   Patient Name:   PEARSON WIDMEYER Date of Exam: 03/10/2022 Medical Rec #:  [id number]    Height:       70.0 in Accession #:    [id number]   Weight:       159.7 lb Date of Birth:  Apr 16, 1965     BSA:          1.897 m Patient Age:    69 years     BP:           134/82 mmHg Patient Gender: M            HR:           86 bpm. Exam Location:  Inpatient Procedure: 2D Echo, Color Doppler and Cardiac Doppler Indications:    elevated troponin  History:        Patient has no prior history of Echocardiogram examinations.                  Chronic kidney disease;  Risk Factors:Hypertension, Dyslipidemia                 and Diabetes.  Sonographer:    Delcie Roch RDCS Referring Phys: [phone] Jasmane Brockway CALDWELL POWELL JR IMPRESSIONS  1. Left ventricular ejection fraction, by estimation, is 25 to 30%. The left ventricle has severely decreased function. The left ventricle demonstrates global hypokinesis. There is mild concentric left ventricular hypertrophy. Left ventricular diastolic  parameters are consistent with Grade I diastolic dysfunction (impaired relaxation).  2. Right ventricular systolic function is normal. The right ventricular size is normal. Tricuspid regurgitation signal is inadequate for assessing PA pressure.  3. Left atrial size was moderately dilated.  4. Right atrial size was mildly dilated.  5. The mitral valve is normal in structure. Mild mitral valve regurgitation. No evidence of mitral stenosis.  6. The aortic valve is tricuspid. Aortic valve regurgitation is not visualized.  7. The inferior vena cava is normal in size with greater than 50% respiratory variability, suggesting right atrial pressure of 3 mmHg. Comparison(s): No prior Echocardiogram. FINDINGS  Left Ventricle: Left ventricular ejection fraction, by estimation, is 25 to 30%. The left ventricle has severely decreased function. The left ventricle demonstrates global hypokinesis. The left ventricular internal cavity size was normal in size. There is mild concentric left ventricular hypertrophy. Left ventricular diastolic parameters are consistent with Grade I diastolic dysfunction (impaired relaxation). Right Ventricle: The right ventricular size is normal. No increase in right ventricular wall thickness. Right ventricular systolic function is normal. Tricuspid regurgitation signal is inadequate for assessing PA pressure. Left Atrium: Left atrial size was moderately dilated. Right Atrium: Right atrial size was mildly dilated. Pericardium: There is no evidence of pericardial  effusion. Mitral Valve: The mitral valve is normal in structure. Mild mitral valve regurgitation. No evidence of mitral valve stenosis. Tricuspid Valve: The tricuspid valve is normal in structure. Tricuspid valve regurgitation is not demonstrated. No evidence of tricuspid stenosis. Aortic Valve: The aortic valve is tricuspid. Aortic valve regurgitation is not visualized. Pulmonic Valve: The pulmonic valve was normal in structure. Pulmonic valve regurgitation is trivial. Aorta: The aortic root and ascending aorta are structurally normal, with no evidence of dilitation. Venous: The inferior vena cava is normal in size with greater than 50% respiratory variability, suggesting right atrial pressure of 3 mmHg. IAS/Shunts: No atrial level shunt detected by color flow Doppler.  LEFT VENTRICLE PLAX 2D LVIDd:         5.40 cm      Diastology LVIDs:         4.90 cm      LV e' medial:    5.66 cm/s LV PW:         1.10 cm      LV E/e' medial:  13.5 LV IVS:        1.10 cm      LV e' lateral:   7.18 cm/s LVOT diam:     2.30 cm      LV E/e' lateral: 10.6 LV SV:         39 LV SV Index:   20 LVOT Area:     4.15 cm  LV Volumes (MOD) LV vol d, MOD A4C: 119.0 ml LV vol s, MOD A4C: 83.5 ml LV SV MOD A4C:     119.0 ml RIGHT VENTRICLE            IVC RV Basal diam:  2.90 cm    IVC diam: 1.40 cm RV S prime:  9.36 cm/s TAPSE (M-mode): 1.8 cm LEFT ATRIUM             Index        RIGHT ATRIUM           Index LA diam:        4.70 cm 2.48 cm/m   RA Area:     16.10 cm LA Vol (A2C):   83.6 ml 44.07 ml/m  RA Volume:   44.40 ml  23.41 ml/m LA Vol (A4C):   77.1 ml 40.64 ml/m LA Biplane Vol: 81.9 ml 43.17 ml/m  AORTIC VALVE LVOT Vmax:   57.40 cm/s LVOT Vmean:  35.100 cm/s LVOT VTI:    0.094 m  AORTA Ao Root diam: 3.00 cm Ao Asc diam:  3.30 cm MITRAL VALVE MV Area (PHT): 3.91 cm       SHUNTS MV Decel Time: 194 msec       Systemic VTI:  0.09 m MR Peak grad:    99.2 mmHg    Systemic Diam: 2.30 cm MR Mean grad:    63.0 mmHg MR Vmax:          498.00 cm/s MR Vmean:        371.0 cm/s MR PISA:         1.01 cm MR PISA Eff ROA: 8 mm MR PISA Radius:  0.40 cm MV E velocity: 76.30 cm/s MV Dorthea Maina velocity: 50.10 cm/s MV E/Tomisha Reppucci ratio:  1.52 Rudean Haskell MD Electronically signed by Rudean Haskell MD Signature Date/Time: 03/10/2022/4:07:13 PM    Final         Scheduled Meds:  amLODipine  5 mg Oral Daily   atorvastatin  20 mg Oral Daily   carvedilol  25 mg Oral BID WC   dapagliflozin propanediol  5 mg Oral Daily   enoxaparin (LOVENOX) injection  40 mg Subcutaneous Q24H   insulin aspart  0-15 Units Subcutaneous TID WC   insulin aspart  0-5 Units Subcutaneous QHS   insulin glargine-yfgn  12 Units Subcutaneous QHS   [START ON 03/11/2022] losartan  25 mg Oral Daily   sodium chloride flush  3 mL Intravenous Q12H   spironolactone  12.5 mg Oral Daily   Continuous Infusions:   LOS: 0 days    Time spent: over 30 min    Fayrene Helper, MD [facility]   To contact the attending provider between 7A-7P or the covering provider during after hours 7P-7A, please log into the web site [email or web address] and access using universal [place] password for that web site. If you do not have the password, please call the hospital operator.  03/10/2022, 5:15 PM

## 2022-03-11 NOTE — Interval H&P Note (Signed)
History and Physical Interval Note:  03/11/2022 3:28 PM  John [name]  has presented today for surgery, with the diagnosis of acute HFrEF.  The various methods of treatment have been discussed with the patient and family. After consideration of risks, benefits and other options for treatment, the patient has consented to  Procedure(s): RIGHT/LEFT HEART CATH AND CORONARY ANGIOGRAPHY (N/A) as a surgical intervention.  The patient's history has been reviewed, patient examined, no change in status, stable for surgery.  I have reviewed the patient's chart and labs.  Questions were answered to the patient's satisfaction.    Cath Lab Visit (complete for each Cath Lab visit)  Clinical Evaluation Leading to the Procedure:   ACS: No.  Non-ACS:    Anginal/Heart Failure Classification: NYHA class IV  Anti-ischemic medical therapy: Maximal Therapy (2 or more classes of medications)  Non-Invasive Test Results: LVEF 25-30% by echo -> high risk  Prior CABG: No previous CABG  John [name]

## 2022-03-12 ENCOUNTER — Other Ambulatory Visit (HOSPITAL_COMMUNITY): Payer: Self-pay

## 2022-03-12 ENCOUNTER — Encounter (HOSPITAL_COMMUNITY): Payer: Self-pay | Admitting: Internal Medicine

## 2022-03-12 DIAGNOSIS — K529 Noninfective gastroenteritis and colitis, unspecified: Secondary | ICD-10-CM | POA: Diagnosis not present

## 2022-03-12 LAB — BASIC METABOLIC PANEL
Anion gap: 6 (ref 5–15)
BUN: 19 mg/dL (ref 6–20)
CO2: 24 mmol/L (ref 22–32)
Calcium: 8.4 mg/dL — ABNORMAL LOW (ref 8.9–10.3)
Chloride: 106 mmol/L (ref 98–111)
Creatinine, Ser: 1.65 mg/dL — ABNORMAL HIGH (ref 0.61–1.24)
GFR, Estimated: 49 mL/min — ABNORMAL LOW (ref >60)
Glucose, Bld: 173 mg/dL — ABNORMAL HIGH (ref 70–99)
Potassium: 3.8 mmol/L (ref 3.5–5.1)
Sodium: 136 mmol/L (ref 135–145)

## 2022-03-12 LAB — CBC
HCT: 39.8 % (ref 39.0–52.0)
Hemoglobin: 13.4 g/dL (ref 13.0–17.0)
MCH: 31 pg (ref 26.0–34.0)
MCHC: 33.7 g/dL (ref 30.0–36.0)
MCV: 92.1 fL (ref 80.0–100.0)
Platelets: 205 10*3/uL (ref 150–400)
RBC: 4.32 MIL/uL (ref 4.22–5.81)
RDW: 12.2 % (ref 11.5–15.5)
WBC: 7.1 10*3/uL (ref 4.0–10.5)
nRBC: 0 % (ref 0.0–0.2)

## 2022-03-12 LAB — MAGNESIUM: Magnesium: 2.2 mg/dL (ref 1.7–2.4)

## 2022-03-12 LAB — GLUCOSE, CAPILLARY
Glucose-Capillary: 138 mg/dL — ABNORMAL HIGH (ref 70–99)
Glucose-Capillary: 200 mg/dL — ABNORMAL HIGH (ref 70–99)

## 2022-03-12 MED ORDER — LOSARTAN POTASSIUM 50 MG PO TABS
50.0000 mg | ORAL_TABLET | Freq: Every day | ORAL | 1 refills | Status: DC
  Filled 2022-03-12 – 2022-04-04 (×2): qty 30, 30d supply, fill #0

## 2022-03-12 MED ORDER — SPIRONOLACTONE 25 MG PO TABS
12.5000 mg | ORAL_TABLET | Freq: Every day | ORAL | 1 refills | Status: DC
  Filled 2022-03-12 – 2022-04-04 (×2): qty 15, 30d supply, fill #0

## 2022-03-12 MED ORDER — INSULIN PEN NEEDLE 32G X 4 MM MISC
0 refills | Status: DC
  Filled 2022-03-12: qty 100, 30d supply, fill #0

## 2022-03-12 MED ORDER — FUROSEMIDE 20 MG PO TABS
20.0000 mg | ORAL_TABLET | Freq: Every day | ORAL | 0 refills | Status: DC | PRN
  Filled 2022-03-12: qty 30, 30d supply, fill #0

## 2022-03-12 MED ORDER — ATORVASTATIN CALCIUM 80 MG PO TABS
80.0000 mg | ORAL_TABLET | Freq: Every day | ORAL | 1 refills | Status: DC
  Filled 2022-03-12 – 2022-04-04 (×2): qty 30, 30d supply, fill #0

## 2022-03-12 MED ORDER — FUROSEMIDE 40 MG PO TABS
40.0000 mg | ORAL_TABLET | Freq: Every day | ORAL | 0 refills | Status: DC | PRN
  Filled 2022-03-12: qty 30, 30d supply, fill #0

## 2022-03-12 MED ORDER — INSULIN GLARGINE-YFGN 100 UNIT/ML ~~LOC~~ SOPN
12.0000 [IU] | PEN_INJECTOR | Freq: Every day | SUBCUTANEOUS | 2 refills | Status: DC
  Filled 2022-03-12: qty 3, 25d supply, fill #0

## 2022-03-12 MED ORDER — ASPIRIN 81 MG PO CHEW
81.0000 mg | CHEWABLE_TABLET | Freq: Every day | ORAL | 0 refills | Status: AC
  Filled 2022-03-12: qty 30, 30d supply, fill #0

## 2022-03-12 NOTE — Discharge Summary (Signed)
Physician Discharge Summary  John [name] BWI:[id number] DOB: January 01, 1966 DOA: 03/09/2022  PCP: [name], DO  Admit date: 03/09/2022 Discharge date: 03/12/2022  Time spent: 40 minutes  Recommendations for Outpatient Follow-up:  Follow outpatient CBC/CMP  Follow creatinine outpatient - restart metformin if improving, consider transition to entresto when improving and cost sorted out with cardiology Follow diabetes maangement outpatient, recent A1c 10.7, but not taking insulin consistently Follow repeat lipids outpatient in 8 weeks, dose increased to 80 mg Repeat EKG for QT outpatient  Discharge Diagnoses:  Principal Problem:   Enteritis Active Problems:   Type 2 diabetes mellitus with hyperglycemia, with long-term current use of insulin (HCC)   Essential hypertension   Hyperlipidemia LDL goal <100   Elevated troponin   Intermittent asthma   Acute HFrEF (heart failure with reduced ejection fraction) ([place])   Heart failure ([place])   Discharge Condition: stable  Diet recommendation: heart healthy, diabetic  Filed Weights   03/09/22 0106 03/09/22 1607 03/11/22 1006  Weight: 80.7 kg 72.4 kg 72 kg    History of present illness:  John [name] is John [name] 56 y.o. male with medical history significant of asthma, DM, and HTN presenting with abdominal pain and n/v.  Initially admitted with enteritis, but elevated troponin as well and had echo which showed HF.  Now s/p LHC, he's been started on GDMT.    See below for additional details   Hospital Course:  Assessment and Plan: Acute Systolic Heart Failure Echo with EF 25-30% New onset, unclear etiology - > HTNsive per cards LHC with non obstructive CAD, consistent with nonischemic cardiomyopathy, most severe lesion is 60-70% stenosis involving small, non dominant RCA. 20-30% proximal LAD stenosis as well as mild luminal irregularities in the ramus intermedius.  Recommending medical therapy and risk factor modification to prevent  progression of CAD.  Recommending escalation of GDMT as tolerated. Coreg, spironolactone, farxiga.  Will start losartan with plans to eventually transition to entresto - likely outpatient given cost and AKI.   Elevated Troponin Never any CP and no CP with exertion at baseline Echo as above Follow A1c 10.7, lipid panel (LDL 124) Non obstructive CAD on cath Aspirin, statin, bb   Acute Kidney Injury Related to med changes and contrast? Follow outpatient - hold metformin for now Needs repeat labs with PCP   Hypertension Meds as above   Enteritis Seems to be resolved at this time Wall thickening of proximal jejunum concerning for enteritis on 12/14 CT   T2DM Follow repeat A1c 10.7 Holding metformin with AKI - resume outpatient  He's on farxiga at home Continue basal insulin - refill provided (he hasn't been taking this consistently)   HLD Continue atorvastatin -> increase to 80 mg with LDL 124   Prolonged Qtc Avoid qt prolonging meds Repeat EKG pending Replace lytes     Procedures: L/RHC Conclusions: Non-obstructive coronary artery disease, consistent with nonischemic cardiomyopathy.  Most severe lesion is John [name] 60-70% stenosis involving small, non-dominant RCA.  There is Anthonio [name] 20-30% proximal LAD stenosis as well as mild luminal irregularities in the ramus intermedius. Upper normal to mildly elevated left heart and pulmonary artery pressures (PCWP 14 mmHg, LVEDP 18 mmHg, mean PA 22 mmHg). Normal right heart filling pressures (RA/RVEDP 6 mmHg). Normal Fick cardiac output/index (CO 4.8 L/min, 2.6 L/min/m^2). Decreased thermodilution cardiac output/index (CO 3.5 L/min, 1.9 L/min/m^2).   Recommendations: Medical therapy and risk factor modification to prevent progression of coronary artery disease. Escalate goal-directed medical therapy for treatment of HFrEF due to  NICM, as tolerated.  echo  IMPRESSIONS     1. Left ventricular ejection fraction, by estimation, is 25 to 30%.  The  left ventricle has severely decreased function. The left ventricle  demonstrates global hypokinesis. There is mild concentric left ventricular  hypertrophy. Left ventricular diastolic   parameters are consistent with Grade I diastolic dysfunction (impaired  relaxation).   2. Right ventricular systolic function is normal. The right ventricular  size is normal. Tricuspid regurgitation signal is inadequate for assessing  PA pressure.   3. Left atrial size was moderately dilated.   4. Right atrial size was mildly dilated.   5. The mitral valve is normal in structure. Mild mitral valve  regurgitation. No evidence of mitral stenosis.   6. The aortic valve is tricuspid. Aortic valve regurgitation is not  visualized.   7. The inferior vena cava is normal in size with greater than 50%  respiratory variability, suggesting right atrial pressure of 3 mmHg.   Comparison(s): No prior Echocardiogram.   Consultations: cardiology  Discharge Exam: Vitals:   03/11/22 2118 03/12/22 0524  BP: 135/85 (!) 170/110  Pulse: 73 82  Resp: 15   Temp: (!) 97.5 F (36.4 C) 97.9 F (36.6 C)  SpO2: 98% 100%   Eager to discharge  General: No acute distress. Cardiovascular: Heart sounds show [name] regular rate, and rhythm.  Lungs: Clear to auscultation bilaterally  Abdomen: Soft, nontender, nondistended  Neurological: Alert and oriented 3. Moves all extremities 4 with equal strength. Cranial nerves II through XII grossly intact. Extremities: No clubbing or cyanosis. No edema.   Discharge Instructions   Discharge Instructions     Call MD for:  difficulty breathing, headache or visual disturbances   Complete by: As directed    Call MD for:  extreme fatigue   Complete by: As directed    Call MD for:  hives   Complete by: As directed    Call MD for:  persistant dizziness or light-headedness   Complete by: As directed    Call MD for:  persistant nausea and vomiting   Complete by: As directed     Call MD for:  redness, tenderness, or signs of infection (pain, swelling, redness, odor or green/yellow discharge around incision site)   Complete by: As directed    Call MD for:  severe uncontrolled pain   Complete by: As directed    Call MD for:  temperature >100.4   Complete by: As directed    Diet - low sodium heart healthy   Complete by: As directed    Discharge instructions   Complete by: As directed    You were seen for gastrointestinal symptoms and CT scan findings concerning for enteritis.  You were noted to have an elevated troponin and your echo showed evidence of systolic heart failure.  You've had Jovonna Nickell catheterization that shows nonobstructive coronary artery disease.  Continue aspirin and lipitor outpatient.  We're planning on starting you on goal directed medical therapy for your heart failure.  We've started you on losartan, spironolactone.  You're already on coreg.  In follow up with cardiology, they'll talk about starting you on another medicine called entresto (and stopping the losartan).  Continue your farxiga. I'll send you with lasix to use only as needed (for swelling or weight gain - weigh yourself daily and take Isaul Landi dose of lasix if your weight increases by 3 lbs in 1 day or 5 lbs in 1 week).  Hold your metformin until you get repeat  labs done with your PCP.  Your kidney function is lower than normal likely related to the contrast and new medicines.  You should repeat labs in [place] Tober few days to make sure things have stabilized.  Return for new, recurrent, or worsening symptoms.  Please ask your PCP to request records from this hospitalization so they know what was done and what the next steps will be.   Increase activity slowly   Complete by: As directed       Allergies as of 03/12/2022       Reactions   Zofran [ondansetron Hcl] Nausea Only   Abdominal pain        Medication List     STOP taking these medications    amLODipine 5 MG tablet Commonly known as:  NORVASC   lisinopril 20 MG tablet Commonly known as: ZESTRIL   metFORMIN 500 MG 24 hr tablet Commonly known as: GLUCOPHAGE-XR       TAKE these medications    albuterol 108 (90 Base) MCG/ACT inhaler Commonly known as: VENTOLIN HFA Inhale 2 puffs into the lungs every 4 (four) hours as needed for wheezing or shortness of breath.   aspirin 81 MG chewable tablet Chew 1 tablet (81 mg total) by mouth daily. Start taking on: March 13, 2022   atorvastatin 40 MG tablet Commonly known as: LIPITOR Take 2 tablets (80 mg total) by mouth daily. Start taking on: March 13, 2022 What changed:  medication strength how much to take   blood glucose meter kit and supplies Kit Dispense based on patient and insurance preference. Use up to four times daily as directed. (FOR ICD-9 250.00, 250.01).   carvedilol 25 MG tablet Commonly known as: COREG Take 1 tablet (25 mg total) by mouth 2 (two) times daily with Neylan Koroma meal.   Farxiga 10 MG Tabs tablet Generic drug: dapagliflozin propanediol Take 1 tablet (10 mg total) by mouth daily before breakfast.   furosemide 40 MG tablet Commonly known as: Lasix Take 1 tablet (40 mg total) by mouth daily as needed for edema or fluid.   gentamicin cream 0.1 % Commonly known as: GARAMYCIN Apply 1 Application topically 2 (two) times daily.   glucose blood test strip Use as instructed, Check blood sugars twice daily   insulin glargine-yfgn 100 UNIT/ML Pen Commonly known as: Semglee (yfgn) Inject 12 Units into the skin at bedtime.   levocetirizine 5 MG tablet Commonly known as: XYZAL Take 1 tablet (5 mg total) by mouth every evening. What changed:  when to take this reasons to take this   losartan 25 MG tablet Commonly known as: COZAAR Take 2 tablets (50 mg total) by mouth daily. Start taking on: March 13, 2022   NovoFine Plus Pen Needle 32G X 4 MM Misc Generic drug: Insulin Pen Needle Use daily to insulin   onetouch ultrasoft  lancets Use as instructed, Check blood sugars twice daily   promethazine 25 MG tablet Commonly known as: PHENERGAN Take 1 tablet (25 mg total) by mouth every 8 (eight) hours as needed for nausea or vomiting.   spironolactone 25 MG tablet Commonly known as: ALDACTONE Take 0.5 tablets (12.5 mg total) by mouth daily. Start taking on: March 13, 2022       Allergies  Allergen Reactions   Zofran [Ondansetron Hcl] Nausea Only    Abdominal pain    Follow-up Information     [facility]. Go in 16 day(s).   Specialty: Cardiology Why: Hospital follow  up PLEASE bring Rozelia Catapano current medication list to appointment FREE valet parking, Entrance C, off [employer] information: [address] [id number] [place] [phone]                 The results of significant diagnostics from this hospitalization (including imaging, microbiology, ancillary and laboratory) are listed below for reference.    Significant Diagnostic Studies: CARDIAC CATHETERIZATION  Result Date: 03/12/2022 Conclusions: Non-obstructive coronary artery disease, consistent with nonischemic cardiomyopathy.  Most severe lesion is Makari Sanko 60-70% stenosis involving small, non-dominant RCA.  There is Shine Scrogham 20-30% proximal LAD stenosis as well as mild luminal irregularities in the ramus intermedius. Upper normal to mildly elevated left heart and pulmonary artery pressures (PCWP 14 mmHg, LVEDP 18 mmHg, mean PA 22 mmHg). Normal right heart filling pressures (RA/RVEDP 6 mmHg). Normal Fick cardiac output/index (CO 4.8 L/min, 2.6 L/min/m^2). Decreased thermodilution cardiac output/index (CO 3.5 L/min, 1.9 L/min/m^2). Recommendations: Medical therapy and risk factor modification to prevent progression of coronary artery disease. Escalate goal-directed medical therapy for treatment of HFrEF due to NICM, as tolerated. Nelva Bush, MD [facility]  ECHOCARDIOGRAM COMPLETE  Result Date: 03/10/2022    ECHOCARDIOGRAM REPORT   Patient Name:   John [name] Date of Exam: 03/10/2022 Medical Rec #:  [id number]    Height:       70.0 in Accession #:    [id number]   Weight:       159.7 lb Date of Birth:  15-May-1965     BSA:          1.897 m Patient Age:    56 years     BP:           134/82 mmHg Patient Gender: M            HR:           86 bpm. Exam Location:  Inpatient Procedure: 2D Echo, Color Doppler and Cardiac Doppler Indications:    elevated troponin  History:        Patient has no prior history of Echocardiogram examinations.                 Chronic kidney disease; Risk Factors:Hypertension, Dyslipidemia                 and Diabetes.  Sonographer:    Johny Chess RDCS Referring Phys: [place]  1. Left ventricular ejection fraction, by estimation, is 25 to 30%. The left ventricle has severely decreased function. The left ventricle demonstrates global hypokinesis. There is mild concentric left ventricular hypertrophy. Left ventricular diastolic  parameters are consistent with Grade I diastolic dysfunction (impaired relaxation).  2. Right ventricular systolic function is normal. The right ventricular size is normal. Tricuspid regurgitation signal is inadequate for assessing PA pressure.  3. Left atrial size was moderately dilated.  4. Right atrial size was mildly dilated.  5. The mitral valve is normal in structure. Mild mitral valve regurgitation. No evidence of mitral stenosis.  6. The aortic valve is tricuspid. Aortic valve regurgitation is not visualized.  7. The inferior vena cava is normal in size with greater than 50% respiratory variability, suggesting right atrial pressure of 3 mmHg. Comparison(s): No prior Echocardiogram. FINDINGS  Left Ventricle: Left ventricular ejection fraction, by estimation, is 25 to 30%. The left ventricle has severely decreased function. The left ventricle demonstrates global  hypokinesis. The left ventricular internal cavity size was normal in size. There is mild  concentric left ventricular hypertrophy. Left ventricular diastolic parameters are consistent with Grade I diastolic dysfunction (impaired relaxation). Right Ventricle: The right ventricular size is normal. No increase in right ventricular wall thickness. Right ventricular systolic function is normal. Tricuspid regurgitation signal is inadequate for assessing PA pressure. Left Atrium: Left atrial size was moderately dilated. Right Atrium: Right atrial size was mildly dilated. Pericardium: There is no evidence of pericardial effusion. Mitral Valve: The mitral valve is normal in structure. Mild mitral valve regurgitation. No evidence of mitral valve stenosis. Tricuspid Valve: The tricuspid valve is normal in structure. Tricuspid valve regurgitation is not demonstrated. No evidence of tricuspid stenosis. Aortic Valve: The aortic valve is tricuspid. Aortic valve regurgitation is not visualized. Pulmonic Valve: The pulmonic valve was normal in structure. Pulmonic valve regurgitation is trivial. Aorta: The aortic root and ascending aorta are structurally normal, with no evidence of dilitation. Venous: The inferior vena cava is normal in size with greater than 50% respiratory variability, suggesting right atrial pressure of 3 mmHg. IAS/Shunts: No atrial level shunt detected by color flow Doppler.  LEFT VENTRICLE PLAX 2D LVIDd:         5.40 cm      Diastology LVIDs:         4.90 cm      LV e' medial:    5.66 cm/s LV PW:         1.10 cm      LV E/e' medial:  13.5 LV IVS:        1.10 cm      LV e' lateral:   7.18 cm/s LVOT diam:     2.30 cm      LV E/e' lateral: 10.6 LV SV:         39 LV SV Index:   20 LVOT Area:     4.15 cm  LV Volumes (MOD) LV vol d, MOD A4C: 119.0 ml LV vol s, MOD A4C: 83.5 ml LV SV MOD A4C:     119.0 ml RIGHT VENTRICLE            IVC RV Basal diam:  2.90 cm    IVC diam: 1.40 cm RV S prime:     9.36 cm/s TAPSE  (M-mode): 1.8 cm LEFT ATRIUM             Index        RIGHT ATRIUM           Index LA diam:        4.70 cm 2.48 cm/m   RA Area:     16.10 cm LA Vol (A2C):   83.6 ml 44.07 ml/m  RA Volume:   44.40 ml  23.41 ml/m LA Vol (A4C):   77.1 ml 40.64 ml/m LA Biplane Vol: 81.9 ml 43.17 ml/m  AORTIC VALVE LVOT Vmax:   57.40 cm/s LVOT Vmean:  35.100 cm/s LVOT VTI:    0.094 m  AORTA Ao Root diam: 3.00 cm Ao Asc diam:  3.30 cm MITRAL VALVE MV Area (PHT): 3.91 cm       SHUNTS MV Decel Time: 194 msec       Systemic VTI:  0.09 m MR Peak grad:    99.2 mmHg    Systemic Diam: 2.30 cm MR Mean grad:    63.0 mmHg MR Vmax:         498.00 cm/s MR Vmean:        371.0 cm/s MR PISA:         1.01  cm MR PISA Eff ROA: 8 mm MR PISA Radius:  0.40 cm MV E velocity: 76.30 cm/s MV Malanie Koloski velocity: 50.10 cm/s MV E/Nadezhda Pollitt ratio:  1.52 Rudean Haskell MD Electronically signed by Rudean Haskell MD Signature Date/Time: 03/10/2022/4:07:13 PM    Final    CT ABDOMEN PELVIS W CONTRAST  Result Date: 03/07/2022 CLINICAL DATA:  Acute generalized abdominal pain, nausea, vomiting. EXAM: CT ABDOMEN AND PELVIS WITH CONTRAST TECHNIQUE: Multidetector CT imaging of the abdomen and pelvis was performed using the standard protocol following bolus administration of intravenous contrast. RADIATION DOSE REDUCTION: This exam was performed according to the departmental dose-optimization program which includes automated exposure control, adjustment of the mA and/or kV according to patient size and/or use of iterative reconstruction technique. CONTRAST:  66m OMNIPAQUE IOHEXOL 300 MG/ML  SOLN COMPARISON:  April 26, 2021. FINDINGS: Lower chest: No acute abnormality. Hepatobiliary: No focal liver abnormality is seen. No gallstones, gallbladder wall thickening, or biliary dilatation. Pancreas: Unremarkable. No pancreatic ductal dilatation or surrounding inflammatory changes. Spleen: Normal in size without focal abnormality. Adrenals/Urinary Tract: Adrenal glands are  unremarkable. Kidneys are normal, without renal calculi, focal lesion, or hydronephrosis. Bladder is unremarkable. Stomach/Bowel: Stomach is within normal limits. Appendix appears normal. There is no evidence of bowel obstruction. However, substantial wall thickening is seen involving proximal jejunum concerning for enteritis. Vascular/Lymphatic: No significant vascular findings are present. No enlarged abdominal or pelvic lymph nodes. Reproductive: Prostate is unremarkable. Other: No abdominal wall hernia or abnormality. Small amount of free fluid is noted in the right pericolic gutter and posterior pelvis which may be related to enteritis. Musculoskeletal: No acute or significant osseous findings. IMPRESSION: Wall thickening of proximal jejunum is noted concerning for enteritis. Minimal amount of free fluid is noted in the right pericolic gutter and posterior pelvis which may be secondary to inflammation. Electronically Signed   By: JMarijo ConceptionM.D.   On: 03/07/2022 17:19   CT Angio Chest PE W/Cm &/Or Wo Cm  Result Date: 03/03/2022 CLINICAL DATA:  Shortness of breath for 2 weeks with cough and difficulty breathing since developing Alessa Mazur cold. EXAM: CT ANGIOGRAPHY CHEST WITH CONTRAST TECHNIQUE: Multidetector CT imaging of the chest was performed using the standard protocol during bolus administration of intravenous contrast. Multiplanar CT image reconstructions and MIPs were obtained to evaluate the vascular anatomy. RADIATION DOSE REDUCTION: This exam was performed according to the departmental dose-optimization program which includes automated exposure control, adjustment of the mA and/or kV according to patient size and/or use of iterative reconstruction technique. CONTRAST:  773mOMNIPAQUE IOHEXOL 350 MG/ML SOLN COMPARISON:  Chest x-ray from earlier today FINDINGS: Cardiovascular: Satisfactory opacification of the pulmonary arteries to the segmental level. No evidence of acute pulmonary embolism. Left  lower lobe posterior basilar segmental pulmonary artery is less enhancing and collapse compared to other similar sized vessels. Generous heart size. No pericardial effusion. Mediastinum/Nodes: No worrisome lymph node. Lungs/Pleura: Patchy ground-glass opacity in the bilateral lungs, history favoring atypical infection although there is some interlobular septal thickening at the bases with generalized airway cuffing. Trace pleural effusions. Upper Abdomen: No acute finding Musculoskeletal: No acute or aggressive finding Review of the MIP images confirms the above findings. IMPRESSION: 1. Patchy ground-glass opacity in the bilateral lungs, concerning for atypical pneumonia given the history but there is interstitial edema and trace pleural effusions. Early alveolar edema is also possible. 2. No acute pulmonary embolism. There is Zaven Klemens nonenhancing subsegmental vessel in left lower lobe which is collapsed, possibly remote embolic disease. Electronically Signed  By: Jorje Guild M.D.   On: 03/03/2022 05:00   DG Chest 2 View  Result Date: 03/03/2022 CLINICAL DATA:  Dyspnea, cough EXAM: CHEST - 2 VIEW COMPARISON:  05/15/2021 FINDINGS: The lungs are symmetrically well expanded. Interval development of moderate bilateral perihilar interstitial pulmonary infiltrate, infection versus edema. No pneumothorax or pleural effusion. Cardiac size within normal limits. IMPRESSION: 1. Interval development of moderate bilateral perihilar interstitial pulmonary infiltrate, infection versus edema. Electronically Signed   By: Fidela Salisbury M.D.   On: 03/03/2022 02:51    Microbiology: Recent Results (from the past 240 hour(s))  Resp Panel by RT-PCR (Flu Tonea Leiphart&B, Covid) Anterior Nasal Swab     Status: None   Collection Time: 03/03/22 12:44 AM   Specimen: Anterior Nasal Swab  Result Value Ref Range Status   SARS Coronavirus 2 by RT PCR NEGATIVE NEGATIVE Final    Comment: (NOTE) SARS-CoV-2 target nucleic acids are NOT  DETECTED.  The SARS-CoV-2 RNA is generally detectable in upper respiratory specimens during the acute phase of infection. The lowest concentration of SARS-CoV-2 viral copies this assay can detect is 138 copies/mL. Carra Brindley negative result does not preclude SARS-Cov-2 infection and should not be used as the sole basis for treatment or other patient management decisions. Dreyah Montrose negative result may occur with  improper specimen collection/handling, submission of specimen other than nasopharyngeal swab, presence of viral mutation(s) within the areas targeted by this assay, and inadequate number of viral copies(<138 copies/mL). Janautica Netzley negative result must be combined with clinical observations, patient history, and epidemiological information. The expected result is Negative.  Fact Sheet for Patients:  EntrepreneurPulse.com.au  Fact Sheet for Healthcare Providers:  IncredibleEmployment.be  This test is no t yet approved or cleared by the [place] FDA and  has been authorized for detection and/or diagnosis of SARS-CoV-2 by FDA under an Emergency Use Authorization (EUA). This EUA will remain  in effect (meaning this test can be used) for the duration of the COVID-19 declaration under Section 564(b)(1) of the Act, 21 U.S.C.section 360bbb-3(b)(1), unless the authorization is terminated  or revoked sooner.       Influenza Mistee Soliman by PCR NEGATIVE NEGATIVE Final   Influenza B by PCR NEGATIVE NEGATIVE Final    Comment: (NOTE) The Xpert Xpress SARS-CoV-2/FLU/RSV plus assay is intended as an aid in the diagnosis of influenza from Nasopharyngeal swab specimens and should not be used as Lailynn Southgate sole basis for treatment. Nasal washings and aspirates are unacceptable for Xpert Xpress SARS-CoV-2/FLU/RSV testing.  Fact Sheet for Patients: EntrepreneurPulse.com.au  Fact Sheet for Healthcare Providers: IncredibleEmployment.be  This test is not yet  approved or cleared by the [place] FDA and has been authorized for detection and/or diagnosis of SARS-CoV-2 by FDA under an Emergency Use Authorization (EUA). This EUA will remain in effect (meaning this test can be used) for the duration of the COVID-19 declaration under Section 564(b)(1) of the Act, 21 U.S.C. section 360bbb-3(b)(1), unless the authorization is terminated or revoked.  Performed at [facility], [place] [address] [place], [place] [address]   Resp panel by RT-PCR (RSV, Flu Asheley Hellberg&B, Covid) Anterior Nasal Swab     Status: None   Collection Time: 03/09/22  8:43 AM   Specimen: Anterior Nasal Swab  Result Value Ref Range Status   SARS Coronavirus 2 by RT PCR NEGATIVE NEGATIVE Final    Comment: (NOTE) SARS-CoV-2 target nucleic acids are NOT DETECTED.  The SARS-CoV-2 RNA is generally detectable in upper respiratory specimens during the acute phase of  infection. The lowest concentration of SARS-CoV-2 viral copies this assay can detect is 138 copies/mL. Capri Veals negative result does not preclude SARS-Cov-2 infection and should not be used as the sole basis for treatment or other patient management decisions. Letanya Froh negative result may occur with  improper specimen collection/handling, submission of specimen other than nasopharyngeal swab, presence of viral mutation(s) within the areas targeted by this assay, and inadequate number of viral copies(<138 copies/mL). Prentice Sackrider negative result must be combined with clinical observations, patient history, and epidemiological information. The expected result is Negative.  Fact Sheet for Patients:  EntrepreneurPulse.com.au  Fact Sheet for Healthcare Providers:  IncredibleEmployment.be  This test is no t yet approved or cleared by the [place] FDA and  has been authorized for detection and/or diagnosis of SARS-CoV-2 by FDA under an Emergency Use Authorization (EUA). This EUA will remain  in effect  (meaning this test can be used) for the duration of the COVID-19 declaration under Section 564(b)(1) of the Act, 21 U.S.C.section 360bbb-3(b)(1), unless the authorization is terminated  or revoked sooner.       Influenza Tityana Pagan by PCR NEGATIVE NEGATIVE Final   Influenza B by PCR NEGATIVE NEGATIVE Final    Comment: (NOTE) The Xpert Xpress SARS-CoV-2/FLU/RSV plus assay is intended as an aid in the diagnosis of influenza from Nasopharyngeal swab specimens and should not be used as Denette Hass sole basis for treatment. Nasal washings and aspirates are unacceptable for Xpert Xpress SARS-CoV-2/FLU/RSV testing.  Fact Sheet for Patients: EntrepreneurPulse.com.au  Fact Sheet for Healthcare Providers: IncredibleEmployment.be  This test is not yet approved or cleared by the [place] FDA and has been authorized for detection and/or diagnosis of SARS-CoV-2 by FDA under an Emergency Use Authorization (EUA). This EUA will remain in effect (meaning this test can be used) for the duration of the COVID-19 declaration under Section 564(b)(1) of the Act, 21 U.S.C. section 360bbb-3(b)(1), unless the authorization is terminated or revoked.     Resp Syncytial Virus by PCR NEGATIVE NEGATIVE Final    Comment: (NOTE) Fact Sheet for Patients: EntrepreneurPulse.com.au  Fact Sheet for Healthcare Providers: IncredibleEmployment.be  This test is not yet approved or cleared by the [place] FDA and has been authorized for detection and/or diagnosis of SARS-CoV-2 by FDA under an Emergency Use Authorization (EUA). This EUA will remain in effect (meaning this test can be used) for the duration of the COVID-19 declaration under Section 564(b)(1) of the Act, 21 U.S.C. section 360bbb-3(b)(1), unless the authorization is terminated or revoked.  Performed at [facility], [address], [place], [place] [address]    Respiratory (~20 pathogens) panel by PCR     Status: None   Collection Time: 03/10/22  7:10 PM   Specimen: Nasopharyngeal Swab; Respiratory  Result Value Ref Range Status   Adenovirus NOT DETECTED NOT DETECTED Final   Coronavirus 229E NOT DETECTED NOT DETECTED Final    Comment: (NOTE) The Coronavirus on the Respiratory Panel, DOES NOT test for the novel  Coronavirus (2019 nCoV)    Coronavirus HKU1 NOT DETECTED NOT DETECTED Final   Coronavirus NL63 NOT DETECTED NOT DETECTED Final   Coronavirus OC43 NOT DETECTED NOT DETECTED Final   Metapneumovirus NOT DETECTED NOT DETECTED Final   Rhinovirus / Enterovirus NOT DETECTED NOT DETECTED Final   Influenza Labib Cwynar NOT DETECTED NOT DETECTED Final   Influenza B NOT DETECTED NOT DETECTED Final   Parainfluenza Virus 1 NOT DETECTED NOT DETECTED Final   Parainfluenza Virus 2 NOT DETECTED NOT DETECTED Final  Parainfluenza Virus 3 NOT DETECTED NOT DETECTED Final   Parainfluenza Virus 4 NOT DETECTED NOT DETECTED Final   Respiratory Syncytial Virus NOT DETECTED NOT DETECTED Final   Bordetella pertussis NOT DETECTED NOT DETECTED Final   Bordetella Parapertussis NOT DETECTED NOT DETECTED Final   Chlamydophila pneumoniae NOT DETECTED NOT DETECTED Final   Mycoplasma pneumoniae NOT DETECTED NOT DETECTED Final    Comment: Performed at [facility], [place] [address], [place], [place] [address]     Labs: Basic Metabolic Panel: Recent Labs  Lab 03/07/22 1206 03/09/22 0131 03/09/22 3212 03/10/22 0141 03/11/22 0811 03/11/22 1552 03/11/22 1556 03/11/22 1559 03/12/22 0219  NA 142 136  --  137 136 139 139 140 136  K 4.1 3.8  --  3.1* 3.9 3.7 3.7 3.7 3.8  CL 103 98  --  104 105  --   --   --  106  CO2 24 25  --  27 25  --   --   --  24  GLUCOSE 175* 193*  --  142* 171*  --   --   --  173*  BUN 23* 24*  --  16 11  --   --   --  19  CREATININE 1.25* 1.20  --  1.21 1.19  --   --   --  1.65*  CALCIUM 9.8 9.8  --  8.3* 8.3*  --   --   --  8.4*  MG  --    --  2.1 1.9 1.8  --   --   --  2.2  PHOS  --   --  3.5  --   --   --   --   --   --    Liver Function Tests: Recent Labs  Lab 03/07/22 0540 03/07/22 1206 03/09/22 0131  AST _0 ALT _1 ALKPHOS 59 57 65  BILITOT 0.7 1.2 1.4*  PROT 7.4 7.8 8.5*  ALBUMIN 3.4* 4.2 4.4   Recent Labs  Lab 03/07/22 0540 03/07/22 1206 03/09/22 0131  LIPASE 28 12 <10*   No results for input(s): "AMMONIA" in the last 168 hours. CBC: Recent Labs  Lab 03/07/22 0540 03/07/22 1206 03/09/22 0131 03/10/22 0141 03/11/22 1552 03/11/22 1556 03/11/22 1559 03/12/22 0219  WBC 9.9 8.4 11.3* 8.8  --   --   --  7.1  NEUTROABS 7.1  --   --   --   --   --   --   --   HGB 13.4 14.1 16.0 13.7 13.3 13.6 13.9 13.4  HCT 39.4 42.4 47.3 39.5 39.0 40.0 41.0 39.8  MCV 93.1 92.8 91.0 90.2  --   --   --  92.1  PLT 248 212 279 232  --   --   --  205   Cardiac Enzymes: Recent Labs  Lab 03/09/22 0843  CKTOTAL 225   BNP: BNP (last 3 results) Recent Labs    03/03/22 0210 03/09/22 0843  BNP 576.0* 318.2*    ProBNP (last 3 results) No results for input(s): "PROBNP" in the last 8760 hours.  CBG: Recent Labs  Lab 03/11/22 0755 03/11/22 1148 03/11/22 2123 03/12/22 0739 03/12/22 1228  GLUCAP 155* 103* 186* 138* 200*       Signed:  Fayrene Helper MD.  [facility] 03/12/2022, 12:42 PM

## 2022-03-12 NOTE — TOC Benefit Eligibility Note (Signed)
Patient Product/process development scientist completed.    The patient is currently admitted and upon discharge could be taking Entresto 24-26 mg.  The current 30 day co-pay is $124.26.   The patient is insured through [facility]   Roland Earl, CPHT Pharmacy Patient Advocate Specialist [facility] Patient Advocate Team Direct Number: [phone]  Fax: [phone]

## 2022-03-12 NOTE — Progress Notes (Signed)
   Heart Failure Stewardship Pharmacist Progress Note   PCP: Sharlene Dory, DO PCP-Cardiologist: Donato Schultz, MD    HPI:  56 yo M with PMH of T2DM, HTN, HLD, and asthma.   Presented to the ED on 12/16 with abdominal pain, nausea, vomiting, and diarrhea x 4 days. Recently diagnosed with gastroenteritis on 12/14. Continued to have abdominal discomfort and weakness. Prior to this, he had just recently restarted his PTA medications after months of not taking anything. BNP elevated. ECHO on 12/17 showed LVEF 25-30%, mild concentric LVH, global hypokinesis, G1DD, RV normal. Cardiology consulted. 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 Upper normal to mildly elevated pressures (PA 22, wedge 14) and reduced thermo output/index (CO 3.5, CI 1.9).  Current HF Medications: Beta Blocker: carvedilol 25 mg BID ACE/ARB/ARNI: losartan 25 mg daily MRA: spironolactone 12.5 mg daily SGLT2i: Farxiga 10 mg daily  Prior to admission HF Medications: Beta blocker: carvedilol 25 mg BID ACE/ARB/ARNI: lisinopril 20 mg daily SGLT2i: Farxiga 10 mg daily  Pertinent Lab Values: Serum creatinine 1.65, BUN 19, Potassium 3.8, Sodium 136, BNP 318.2, Magnesium 2.2, A1c 10.7   Vital Signs: Weight: 158 lbs (admission weight: 159 lbs) Blood pressure: 130/80-170/100s  Heart rate: 70-80s  I/O: not well documented  Medication Assistance / Insurance Benefits Check: Does the patient have prescription insurance?  Yes Type of insurance plan: [employer] commercial insurance  Outpatient Pharmacy:  Prior to admission outpatient pharmacy: [facility] Is the patient willing to use [facility] TOC pharmacy at discharge? Yes   Assessment: 1. Acute systolic CHF (LVEF 25-30%), due to NICM. NYHA class II symptoms. - Off IV lasix, likely will need PRN lasix at discharge - Continue carvedilol 25 mg BID - Continue losartan 25 mg daily, last dose of lisinopril 12/17. Consider transitioning to [facility] tomorrow  AM if renal function improved. - Continue spironolactone 12.5 mg daily - Continue Farxiga 10 mg daily   Plan: 1) Medication changes recommended at this time: - Add Entresto 24/26 mg BID tomorrow  2) Patient assistance: Sherryll Burger copay card lowers to $10  3)  Education  - Patient has been educated on current HF medications and potential additions to HF medication regimen - Patient verbalizes understanding that over the next few months, these medication doses may change and more medications may be added to optimize HF regimen - Patient has been educated on basic disease state pathophysiology and goals of therapy   Sharen Hones, PharmD, BCPS Heart Failure Stewardship Pharmacist Phone [phone]

## 2022-03-12 NOTE — Inpatient Diabetes Management (Signed)
Inpatient Diabetes Program Recommendations  AACE/ADA: New Consensus Statement on Inpatient Glycemic Control (2015)  Target Ranges:  Prepandial:   less than 140 mg/dL      Peak postprandial:   less than 180 mg/dL (1-2 hours)      Critically ill patients:  140 - 180 mg/dL   Lab Results  Component Value Date   GLUCAP 200 (H) 03/12/2022   HGBA1C 10.7 (H) 03/10/2022    Freestyle Libre 3 applied to pt right upper arm. Walked pt through the steps of applying. Discussed how to read results when scanning sensor. Discussed when to check CBG.  Thanks,  Christena Deem RN, MSN, BC-ADM Inpatient Diabetes Coordinator Team Pager [phone] (8a-5p)

## 2022-03-12 NOTE — Progress Notes (Signed)
Heart Failure Nurse Navigator Progress Note  PCP: Sharlene Dory, DO PCP-Cardiologist: Anne Fu Admission Diagnosis: None Admitted from: Home  Presentation:   John [name] presented with nausea, vomiting, and diarrhea with abdominal pain. Recent Dx of gastroenteritis on 03/07/22. BNP elevated, R/L HC done on 12/19 showed non obstructive CAD. Patient was educated on the sign and symptoms of heart failure, daily weights, when to call his doctor or go to the ED, diet/ fluid restrictions, taking all medications as prescribed and attending all medical appointments. Patient verbalized his understanding, and is scheduled for a HF TOC appointment on 03/28/22.   ECHO/ LVEF: 25-30% G1DD  Clinical Course:  Past Medical History:  Diagnosis Date   Asthma    Diabetes mellitus without complication (HCC)    History of chicken pox    History of DVT (deep vein thrombosis)    s/p trauma of R leg -- 2014. No other history of DVT   History of kidney stones    Hypertension    Lazy eye of left side      Social History   Socioeconomic History   Marital status: Married    Spouse name: Not on file   Number of children: Not on file   Years of education: Not on file   Highest education level: Not on file  Occupational History   Occupation: Cook    Comment: [employer]   Tobacco Use   Smoking status: Never   Smokeless tobacco: Never  Substance and Sexual Activity   Alcohol use: No   Drug use: No   Sexual activity: Not on file  Other Topics Concern   Not on file  Social History Narrative   Not on file   Social Determinants of Health   Financial Resource Strain: Not on file  Food Insecurity: No Food Insecurity (03/09/2022)   Hunger Vital Sign    Worried About Running Out of Food in the Last Year: Never true    Ran Out of Food in the Last Year: Never true  Transportation Needs: No Transportation Needs (03/09/2022)   PRAPARE - Administrator, Civil Service  (Medical): No    Lack of Transportation (Non-Medical): No  Physical Activity: Not on file  Stress: Not on file  Social Connections: Not on file   Education Assessment and Provision:  Detailed education and instructions provided on heart failure disease management including the following:  Signs and symptoms of Heart Failure When to call the physician Importance of daily weights Low sodium diet Fluid restriction Medication management Anticipated future follow-up appointments  Patient education given on each of the above topics.  Patient acknowledges understanding via teach back method and acceptance of all instructions.  Education Materials:  "Living Better With Heart Failure" Booklet, HF zone tool, & Daily Weight Tracker Tool.  Patient has scale at home: yes Patient has pill box at home: NA    High Risk Criteria for Readmission and/or Poor Patient Outcomes: Heart failure hospital admissions (last 6 months): 0  No Show rate: 15 % Difficult social situation: No Demonstrates medication adherence: Yes Primary Language: english Literacy level: Reading, writing, and comprehension  Barriers of Care:   Continued HF education ( new ) Diet/ fluid restrictions  Considerations/Referrals:   Referral made to Heart Failure Pharmacist Stewardship: yes Referral made to Heart Failure CSW/NCM TOC: no Referral made to Heart & Vascular TOC clinic: yes,   Items for Follow-up on DC/TOC: Continued Hf education ( new) Diet/ fluid restrictions  Earnestine Leys, BSN, Clinical cytogeneticist Only

## 2022-03-12 NOTE — Progress Notes (Addendum)
Rounding Note    Patient Name: John [name] Date of Encounter: 03/12/2022  [facility] Cardiologist: Donato Schultz, MD   Subjective   Patient reports feeling very well this AM. Denies chest pain, palpitations, sob, ankle edema. Right radial cath site is stable, nontender, no bleeding or bruising.   Inpatient Medications    Scheduled Meds:  aspirin  81 mg Oral Daily   atorvastatin  40 mg Oral Daily   carvedilol  25 mg Oral BID WC   dapagliflozin propanediol  10 mg Oral Daily   enoxaparin (LOVENOX) injection  40 mg Subcutaneous Q24H   insulin aspart  0-15 Units Subcutaneous TID WC   insulin aspart  0-5 Units Subcutaneous QHS   insulin glargine-yfgn  12 Units Subcutaneous QHS   losartan  25 mg Oral Daily   sodium chloride flush  3 mL Intravenous Q12H   sodium chloride flush  3 mL Intravenous Q12H   sodium chloride flush  3 mL Intravenous Q12H   spironolactone  12.5 mg Oral Daily   Continuous Infusions:  sodium chloride     PRN Meds: sodium chloride, acetaminophen **OR** acetaminophen, albuterol, morphine injection, oxyCODONE, sodium chloride flush   Vital Signs    Vitals:   03/11/22 1514 03/11/22 1643 03/11/22 2118 03/12/22 0524  BP:  (!) 148/99 135/85 (!) 170/110  Pulse:  78 73 82  Resp:  16 15   Temp:  98.4 F (36.9 C) (!) 97.5 F (36.4 C) 97.9 F (36.6 C)  TempSrc:  Oral Oral Oral  SpO2: 98% 100% 98% 100%  Weight:      Height:       No intake or output data in the 24 hours ending 03/12/22 0722    03/11/2022   10:06 AM 03/09/2022    4:07 PM 03/09/2022    1:06 AM  Last 3 Weights  Weight (lbs) 158 lb 11.7 oz 159 lb 11.2 oz 178 lb  Weight (kg) 72 kg 72.439 kg 80.74 kg      Telemetry    NSR - Personally Reviewed  ECG    No new tracings since 12/16 - Personally Reviewed  Physical Exam   GEN: No acute distress.  Sitting upright on the side of the bed eating breakfast  Neck: No JVD Cardiac: RRR, no murmurs, rubs, or gallops. Radial pulses  2+ bilaterally. Right radial cath site is soft and nontender. No bleeding or bruising  Respiratory: Clear to auscultation bilaterally. Normal WOB on room air  GI: Soft, nontender, non-distended  MS: No edema; No deformity. Neuro:  Nonfocal  Psych: Normal affect   Labs    High Sensitivity Troponin:   Recent Labs  Lab 03/09/22 0843 03/09/22 1118 03/09/22 1752 03/09/22 2015  TROPONINIHS 183* 201* 192* 211*     Chemistry Recent Labs  Lab 03/07/22 0540 03/07/22 1206 03/09/22 0131 03/09/22 0843 03/10/22 0141 03/11/22 0811 03/11/22 1552 03/11/22 1556 03/11/22 1559 03/12/22 0219  NA 141 142 136  --  137 136   < > 139 140 136  K 4.1 4.1 3.8  --  3.1* 3.9   < > 3.7 3.7 3.8  CL 106 103 98  --  104 105  --   --   --  106  CO2 --  27 25  --   --   --  24  GLUCOSE 214* 175* 193*  --  142* 171*  --   --   --  173*  BUN 22* 23*  24*  --  16 11  --   --   --  19  CREATININE 1.52* 1.25* 1.20  --  1.21 1.19  --   --   --  1.65*  CALCIUM 9.4 9.8 9.8  --  8.3* 8.3*  --   --   --  8.4*  MG  --   --   --    < > 1.9 1.8  --   --   --  2.2  PROT 7.4 7.8 8.5*  --   --   --   --   --   --   --   ALBUMIN 3.4* 4.2 4.4  --   --   --   --   --   --   --   AST 21 16 19   --   --   --   --   --   --   --   ALT 22 19 22   --   --   --   --   --   --   --   ALKPHOS 59 57 65  --   --   --   --   --   --   --   BILITOT 0.7 1.2 1.4*  --   --   --   --   --   --   --   GFRNONAA 54* >60 >60  --  >60 >60  --   --   --  49*  ANIONGAP 10 15 13   --  6 6  --   --   --  6   < > = values in this interval not displayed.    Lipids  Recent Labs  Lab 03/10/22 0141  CHOL 183  TRIG 68  HDL 45  LDLCALC 124*  CHOLHDL 4.1    Hematology Recent Labs  Lab 03/09/22 0131 03/10/22 0141 03/11/22 1552 03/11/22 1556 03/11/22 1559 03/12/22 0219  WBC 11.3* 8.8  --   --   --  7.1  RBC 5.20 4.38  --   --   --  4.32  HGB 16.0 13.7   < > 13.6 13.9 13.4  HCT 47.3 39.5   < > 40.0 41.0 39.8  MCV 91.0 90.2  --    --   --  92.1  MCH 30.8 31.3  --   --   --  31.0  MCHC 33.8 34.7  --   --   --  33.7  RDW 12.4 12.2  --   --   --  12.2  PLT 279 232  --   --   --  205   < > = values in this interval not displayed.   Thyroid No results for input(s): "TSH", "FREET4" in the last 168 hours.  BNP Recent Labs  Lab 03/09/22 0843  BNP 318.2*    DDimer No results for input(s): "DDIMER" in the last 168 hours.   Radiology    CARDIAC CATHETERIZATION  Result Date: 03/12/2022 Conclusions: Non-obstructive coronary artery disease, consistent with nonischemic cardiomyopathy.  Most severe lesion is a 60-70% stenosis involving small, non-dominant RCA.  There is a 20-30% proximal LAD stenosis as well as mild luminal irregularities in the ramus intermedius. Upper normal to mildly elevated left heart and pulmonary artery pressures (PCWP 14 mmHg, LVEDP 18 mmHg, mean PA 22 mmHg). Normal right heart filling pressures (RA/RVEDP 6 mmHg). Normal Fick cardiac output/index (CO 4.8 L/min, 2.6 L/min/m^2). Decreased thermodilution cardiac output/index (  CO 3.5 L/min, 1.9 L/min/m^2). Recommendations: Medical therapy and risk factor modification to prevent progression of coronary artery disease. Escalate goal-directed medical therapy for treatment of HFrEF due to NICM, as tolerated. John Kendall, MD [facility]  ECHOCARDIOGRAM COMPLETE  Result Date: 03/10/2022    ECHOCARDIOGRAM REPORT   Patient Name:   John [name] Date of Exam: 03/10/2022 Medical Rec #:  [id number]    Height:       70.0 in Accession #:    [id number]   Weight:       159.7 lb Date of Birth:  07/01/1965     BSA:          1.897 m Patient Age:    56 years     BP:           134/82 mmHg Patient Gender: M            HR:           86 bpm. Exam Location:  Inpatient Procedure: 2D Echo, Color Doppler and Cardiac Doppler Indications:    elevated troponin  History:        Patient has no prior history of Echocardiogram examinations.                 Chronic kidney disease; Risk  Factors:Hypertension, Dyslipidemia                 and Diabetes.  Sonographer:    Delcie Roch RDCS Referring Phys: [phone] A CALDWELL POWELL JR IMPRESSIONS  1. Left ventricular ejection fraction, by estimation, is 25 to 30%. The left ventricle has severely decreased function. The left ventricle demonstrates global hypokinesis. There is mild concentric left ventricular hypertrophy. Left ventricular diastolic  parameters are consistent with Grade I diastolic dysfunction (impaired relaxation).  2. Right ventricular systolic function is normal. The right ventricular size is normal. Tricuspid regurgitation signal is inadequate for assessing PA pressure.  3. Left atrial size was moderately dilated.  4. Right atrial size was mildly dilated.  5. The mitral valve is normal in structure. Mild mitral valve regurgitation. No evidence of mitral stenosis.  6. The aortic valve is tricuspid. Aortic valve regurgitation is not visualized.  7. The inferior vena cava is normal in size with greater than 50% respiratory variability, suggesting right atrial pressure of 3 mmHg. Comparison(s): No prior Echocardiogram. FINDINGS  Left Ventricle: Left ventricular ejection fraction, by estimation, is 25 to 30%. The left ventricle has severely decreased function. The left ventricle demonstrates global hypokinesis. The left ventricular internal cavity size was normal in size. There is mild concentric left ventricular hypertrophy. Left ventricular diastolic parameters are consistent with Grade I diastolic dysfunction (impaired relaxation). Right Ventricle: The right ventricular size is normal. No increase in right ventricular wall thickness. Right ventricular systolic function is normal. Tricuspid regurgitation signal is inadequate for assessing PA pressure. Left Atrium: Left atrial size was moderately dilated. Right Atrium: Right atrial size was mildly dilated. Pericardium: There is no evidence of pericardial effusion. Mitral Valve: The  mitral valve is normal in structure. Mild mitral valve regurgitation. No evidence of mitral valve stenosis. Tricuspid Valve: The tricuspid valve is normal in structure. Tricuspid valve regurgitation is not demonstrated. No evidence of tricuspid stenosis. Aortic Valve: The aortic valve is tricuspid. Aortic valve regurgitation is not visualized. Pulmonic Valve: The pulmonic valve was normal in structure. Pulmonic valve regurgitation is trivial. Aorta: The aortic root and ascending aorta are structurally normal, with no evidence of dilitation. Venous: The inferior vena cava  is normal in size with greater than 50% respiratory variability, suggesting right atrial pressure of 3 mmHg. IAS/Shunts: No atrial level shunt detected by color flow Doppler.  LEFT VENTRICLE PLAX 2D LVIDd:         5.40 cm      Diastology LVIDs:         4.90 cm      LV e' medial:    5.66 cm/s LV PW:         1.10 cm      LV E/e' medial:  13.5 LV IVS:        1.10 cm      LV e' lateral:   7.18 cm/s LVOT diam:     2.30 cm      LV E/e' lateral: 10.6 LV SV:         39 LV SV Index:   20 LVOT Area:     4.15 cm  LV Volumes (MOD) LV vol d, MOD A4C: 119.0 ml LV vol s, MOD A4C: 83.5 ml LV SV MOD A4C:     119.0 ml RIGHT VENTRICLE            IVC RV Basal diam:  2.90 cm    IVC diam: 1.40 cm RV S prime:     9.36 cm/s TAPSE (M-mode): 1.8 cm LEFT ATRIUM             Index        RIGHT ATRIUM           Index LA diam:        4.70 cm 2.48 cm/m   RA Area:     16.10 cm LA Vol (A2C):   83.6 ml 44.07 ml/m  RA Volume:   44.40 ml  23.41 ml/m LA Vol (A4C):   77.1 ml 40.64 ml/m LA Biplane Vol: 81.9 ml 43.17 ml/m  AORTIC VALVE LVOT Vmax:   57.40 cm/s LVOT Vmean:  35.100 cm/s LVOT VTI:    0.094 m  AORTA Ao Root diam: 3.00 cm Ao Asc diam:  3.30 cm MITRAL VALVE MV Area (PHT): 3.91 cm       SHUNTS MV Decel Time: 194 msec       Systemic VTI:  0.09 m MR Peak grad:    99.2 mmHg    Systemic Diam: 2.30 cm MR Mean grad:    63.0 mmHg MR Vmax:         498.00 cm/s MR Vmean:         371.0 cm/s MR PISA:         1.01 cm MR PISA Eff ROA: 8 mm MR PISA Radius:  0.40 cm MV E velocity: 76.30 cm/s MV A velocity: 50.10 cm/s MV E/A ratio:  1.52 Riley Lam MD Electronically signed by Riley Lam MD Signature Date/Time: 03/10/2022/4:07:13 PM    Final     Cardiac Studies   Echocardiogram 03/10/22 1. Left ventricular ejection fraction, by estimation, is 25 to 30%. The  left ventricle has severely decreased function. The left ventricle  demonstrates global hypokinesis. There is mild concentric left ventricular  hypertrophy. Left ventricular diastolic   parameters are consistent with Grade I diastolic dysfunction (impaired  relaxation).   2. Right ventricular systolic function is normal. The right ventricular  size is normal. Tricuspid regurgitation signal is inadequate for assessing  PA pressure.   3. Left atrial size was moderately dilated.   4. Right atrial size was mildly dilated.   5. The mitral valve is normal  in structure. Mild mitral valve  regurgitation. No evidence of mitral stenosis.   6. The aortic valve is tricuspid. Aortic valve regurgitation is not  visualized.   7. The inferior vena cava is normal in size with greater than 50%  respiratory variability, suggesting right atrial pressure of 3 mmHg.   Comparison(s): No prior Echocardiogram.   Right/Left Heart Catheterization 03/11/22 Conclusions: Non-obstructive coronary artery disease, consistent with nonischemic cardiomyopathy.  Most severe lesion is a 60-70% stenosis involving small, non-dominant RCA.  There is a 20-30% proximal LAD stenosis as well as mild luminal irregularities in the ramus intermedius. Upper normal to mildly elevated left heart and pulmonary artery pressures (PCWP 14 mmHg, LVEDP 18 mmHg, mean PA 22 mmHg). Normal right heart filling pressures (RA/RVEDP 6 mmHg). Normal Fick cardiac output/index (CO 4.8 L/min, 2.6 L/min/m^2). Decreased thermodilution cardiac output/index (CO  3.5 L/min, 1.9 L/min/m^2).   Recommendations: Medical therapy and risk factor modification to prevent progression of coronary artery disease. Escalate goal-directed medical therapy for treatment of HFrEF due to NICM, as tolerated  Diagnostic Dominance: Left   Patient Profile     56 y.o. male with newly discovered cardiomyopathy, possibly hypertensive with multiple cardiac risk factors such as diabetes hypertension.   Assessment & Plan    Acute Combined Systolic and Diastolic Heart Failure  Nonischemic Cardiomyopathy  HTN  - Echocardiogram this admission showed EF 25-30% (newly reduced), grade I diastolic dysfunction  -  Patient underwent L/R heart catheterization yesterday that showed non-obstructive CAD, consistent with nonischemic cardiomyopathy. 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 BP improving  - Patient had a dose of lisinopril the AM of 12/17-- has been approx 48 hours since last ACEi - Creatinine increased to 1.65 today, so we will hold off on transitioning to entresto for now. Can further titrate GDMT/transition to entresto at outpatient follow up as renal function allows  - Patient should be discharged with lasix 20 mg PRN for ankle edema, weight gain, SOB. Discussed with patient and wife at bedside who reported understanding when to use PRN lasix   Nonobstructive CAD  - Cath yesterday, results above  - Medical therapy to prevent progression of CAD-- patient on ASA, lipitor, carvedilol  HLD  - Lipid panel this admission showed total cholesterol 183, HDL 45, LDL 124 - Goal cholesterol <70 - Increase lipitor to 80 mg daily  - Needs LFTs and lipid panel in 8  weeks   Type 2 DM  - A1C 10.7 - Needs improved diabetes management - Managed per IM while admitted, will need PCP follow up as an outpatient    DC Planning  - Patient works as a Financial risk analyst, and he is not allowed to life >5 lbs for at least 5 days (ideally 1 week) after his cath. Because of this, he should not return to work until 12/26 - Radial site care instructions provided under discharge instructions    For questions or updates, please contact [facility] Please consult [email or web address] for contact info under        Signed, Jonita Albee, PA-C  03/12/2022, 7:22 AM    Personally seen and examined. Agree with above.  56 year old with newly discovered nonischemic cardiomyopathy EF  25 to 30%.  Had cardiac catheterization this admission.  Mildly elevated left heart and pulmonary pressures.  Likely hypertensive. - Continue with carvedilol 25 twice daily, Farxiga 10 mg a day (he has been taking this and it is affordable for him), losartan increased to 50 mg a day, spironolactone 12.5 mg a day.  Blood pressure will continue to improve.  He has not started [place].  Creatinine is 1.65.  Pharmacist says that it would be close to $130 a month for his co-pay.  We can try as an outpatient.  Patient assistance may be needed.  The cost is disappointing.  Increasing atorvastatin to 80, LDL was 124.  Nonobstructive CAD.  Diabetes A1c 10.7.  He will need more management as an outpatient.  Discussed with his wife who works at [employer] and Automotive engineer.  She worked here for 6 years.  Very nice.  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  Okay for discharge.  Donato Schultz, MD

## 2022-03-12 NOTE — Discharge Instructions (Signed)

## 2022-03-14 ENCOUNTER — Other Ambulatory Visit (HOSPITAL_COMMUNITY): Payer: Self-pay

## 2022-03-14 LAB — LIPOPROTEIN A (LPA): Lipoprotein (a): 351 nmol/L — ABNORMAL HIGH (ref <75.0)

## 2022-03-22 ENCOUNTER — Telehealth: Payer: Self-pay | Admitting: Podiatry

## 2022-03-22 NOTE — Telephone Encounter (Signed)
Left message on vm for patient to pick up orthotics

## 2022-03-26 ENCOUNTER — Other Ambulatory Visit (HOSPITAL_COMMUNITY): Payer: Self-pay

## 2022-03-28 ENCOUNTER — Encounter (HOSPITAL_COMMUNITY): Payer: Self-pay

## 2022-03-28 ENCOUNTER — Ambulatory Visit (HOSPITAL_COMMUNITY)
Admit: 2022-03-28 | Discharge: 2022-03-28 | Disposition: A | Discharge status: Home / Self Care | Payer: Commercial Managed Care - PPO | Attending: Physician Assistant | Admitting: Physician Assistant

## 2022-03-28 VITALS — BP 170/102 | HR 84 | Wt 167.4 lb

## 2022-03-28 DIAGNOSIS — E119 Type 2 diabetes mellitus without complications: Secondary | ICD-10-CM | POA: Insufficient documentation

## 2022-03-28 DIAGNOSIS — N179 Acute kidney failure, unspecified: Secondary | ICD-10-CM | POA: Diagnosis not present

## 2022-03-28 DIAGNOSIS — I2510 Atherosclerotic heart disease of native coronary artery without angina pectoris: Secondary | ICD-10-CM | POA: Diagnosis not present

## 2022-03-28 DIAGNOSIS — J45909 Unspecified asthma, uncomplicated: Secondary | ICD-10-CM | POA: Diagnosis not present

## 2022-03-28 DIAGNOSIS — E782 Mixed hyperlipidemia: Secondary | ICD-10-CM | POA: Diagnosis not present

## 2022-03-28 DIAGNOSIS — Z79899 Other long term (current) drug therapy: Secondary | ICD-10-CM | POA: Insufficient documentation

## 2022-03-28 DIAGNOSIS — E785 Hyperlipidemia, unspecified: Secondary | ICD-10-CM | POA: Diagnosis not present

## 2022-03-28 DIAGNOSIS — I5020 Unspecified systolic (congestive) heart failure: Secondary | ICD-10-CM

## 2022-03-28 DIAGNOSIS — E1165 Type 2 diabetes mellitus with hyperglycemia: Secondary | ICD-10-CM

## 2022-03-28 DIAGNOSIS — I110 Hypertensive heart disease with heart failure: Secondary | ICD-10-CM | POA: Insufficient documentation

## 2022-03-28 DIAGNOSIS — I10 Essential (primary) hypertension: Secondary | ICD-10-CM | POA: Diagnosis not present

## 2022-03-28 DIAGNOSIS — I428 Other cardiomyopathies: Secondary | ICD-10-CM | POA: Diagnosis not present

## 2022-03-28 DIAGNOSIS — Z794 Long term (current) use of insulin: Secondary | ICD-10-CM | POA: Diagnosis not present

## 2022-03-28 LAB — COMPREHENSIVE METABOLIC PANEL
ALT: 21 U/L (ref 0–44)
AST: 20 U/L (ref 15–41)
Albumin: 3.4 g/dL — ABNORMAL LOW (ref 3.5–5.0)
Alkaline Phosphatase: 51 U/L (ref 38–126)
Anion gap: 11 (ref 5–15)
BUN: 35 mg/dL — ABNORMAL HIGH (ref 6–20)
CO2: 18 mmol/L — ABNORMAL LOW (ref 22–32)
Calcium: 9 mg/dL (ref 8.9–10.3)
Chloride: 110 mmol/L (ref 98–111)
Creatinine, Ser: 1.36 mg/dL — ABNORMAL HIGH (ref 0.61–1.24)
GFR, Estimated: >60 mL/min (ref >60)
Glucose, Bld: 124 mg/dL — ABNORMAL HIGH (ref 70–99)
Potassium: 3.6 mmol/L (ref 3.5–5.1)
Sodium: 139 mmol/L (ref 135–145)
Total Bilirubin: 1.2 mg/dL (ref 0.3–1.2)
Total Protein: 6.8 g/dL (ref 6.5–8.1)

## 2022-03-28 LAB — BRAIN NATRIURETIC PEPTIDE: B Natriuretic Peptide: 252.9 pg/mL — ABNORMAL HIGH (ref 0.0–100.0)

## 2022-03-28 LAB — CBC
HCT: 37.4 % — ABNORMAL LOW (ref 39.0–52.0)
Hemoglobin: 12.2 g/dL — ABNORMAL LOW (ref 13.0–17.0)
MCH: 30.7 pg (ref 26.0–34.0)
MCHC: 32.6 g/dL (ref 30.0–36.0)
MCV: 94.2 fL (ref 80.0–100.0)
Platelets: 159 10*3/uL (ref 150–400)
RBC: 3.97 MIL/uL — ABNORMAL LOW (ref 4.22–5.81)
RDW: 12.2 % (ref 11.5–15.5)
WBC: 8.7 10*3/uL (ref 4.0–10.5)
nRBC: 0 % (ref 0.0–0.2)

## 2022-03-28 NOTE — Progress Notes (Addendum)
HEART & VASCULAR TRANSITION OF CARE CONSULT NOTE     Referring Physician: Dr. Florene Glen Primary Care: Dr. Nani Ravens Primary Cardiologist: Dr. Marlou Porch  HPI: Referred to clinic by Dr. Florene Glen with [facility] for heart failure consultation. 57 y.o. male with history of asthma, DM, HTN, hx left great toe amputation, R foot diabetic ulcer.   Had been seen in UC and later ED in December 2023 with elevated BP and cough. Had not taken his BP medications. At ED visit he was treated for possible bronchitis and given IV lasix for suspected CHF.  Later admitted to [facility] 03/09/22 for evaluation of suspected enteritis and elevated troponin. HS troponin mildly elevated with flat trend, 183>201>192>211. Echo 03/10/22: EF 25-30%, mild LVH, grade I DD, RV okay, mild MR  [facility] 03/11/22: Nonobstructive CAD (most severe lesion 60-70% RCA), RA mean 2, PA mean 20, PCWP mean 14, LVEDP 21, Fick CI 2.6, Thermo CI 1.9  Hypertensive cardiomyopathy suspected. GDMT titrated.   He is here today for hospital follow-up. Has been feeling well. Reports taking all meds as prescribed. No dyspnea, orthopnea, PND or lower extremity edema. His weight has been stable around 160 lb. He has not been checking BP at home but states he has a wrist cuff he can use for monitoring. Has cut back sodium intake.  Has a wound on plantar aspect of his right great toe. He has been following with podiatry. His wife believes healing is improving now that he is more adherent with medications.  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 Denies ETOH, tobacco, or drug use.  His mother has hx of CHF, no other family history of heart failure.   Review of Systems: [y] = yes, _0  = no   General: Weight gain _1 ; Weight loss _2 ; Anorexia _3 ; Fatigue _4 ; Fever _5 ; Chills _6 ; Weakness _7   Cardiac: Chest pain/pressure _8 ; Resting SOB _9 ; Exertional SOB _10 ; Orthopnea _11 ; Pedal Edema _12 ; Palpitations _13 ; Syncope _14 ; Presyncope _15 ; Paroxysmal  nocturnal dyspnea_16   Pulmonary: Cough _17 ; Wheezing_18 ; Hemoptysis_19 ; Sputum _20 ; Snoring _21   GI: Vomiting_22 ; Dysphagia_23 ; Melena_24 ; Hematochezia _25 ; Heartburn_26 ; Abdominal pain _27 ; Constipation _28 ; Diarrhea _29 ; BRBPR _30   GU: Hematuria_31 ; Dysuria _32 ; Nocturia_33   Vascular: Pain in legs with walking _34 ; Pain in feet with lying flat _35 ; Non-healing sores [Y]; Stroke _36 ; TIA _37 ; Slurred speech _38 ;  Neuro: Headaches_39 ; Vertigo_40 ; Seizures_41 ; Paresthesias_42 ;Blurred vision _43 ; Diplopia _44 ; Vision changes _45   Ortho/Skin: Arthritis _46 ; Joint pain _47 ; Muscle pain _48 ; Joint swelling _49 ; Back Pain _50 ; Rash _51   Psych: Depression_52 ; Anxiety_53   Heme: Bleeding problems _54 ; Clotting disorders _55 ; Anemia _56   Endocrine: Diabetes [Y]; Thyroid dysfunction_57    Past Medical History:  Diagnosis Date   Asthma    Diabetes mellitus without complication ([place])    History of chicken pox    History of DVT (deep vein thrombosis)    s/p trauma of R leg -- 2014. No other history of DVT   History of kidney stones    Hypertension    Lazy eye of left side     Current Outpatient Medications  Medication Sig Dispense Refill   albuterol (VENTOLIN HFA) 108 (90  Base) MCG/ACT inhaler Inhale 2 puffs into the lungs every 4 (four) hours as needed for wheezing or shortness of breath. 6.7 g 1   aspirin 81 MG chewable tablet Chew 1 tablet (81 mg total) by mouth daily. 30 tablet 0   atorvastatin (LIPITOR) 80 MG tablet Take 1 tablet (80 mg total) by mouth daily. 30 tablet 1   blood glucose meter kit and supplies KIT Dispense based on patient and insurance preference. Use up to four times daily as directed. (FOR ICD-9 250.00, 250.01). 1 each 0   carvedilol (COREG) 25 MG tablet Take 1 tablet (25 mg total) by mouth 2 (two) times daily with a meal. 180 tablet 2   dapagliflozin propanediol (FARXIGA) 10 MG TABS tablet Take 1 tablet (10 mg total) by mouth daily before breakfast. 90 tablet 2   furosemide  (LASIX) 20 MG tablet Take 1 tablet (20 mg total) by mouth daily as needed for edema or fluid. 30 tablet 0   gentamicin cream (GARAMYCIN) 0.1 % Apply 1 Application topically 2 (two) times daily. 30 g 1   glucose blood test strip Use as instructed, Check blood sugars twice daily 100 each 12   insulin glargine-yfgn (SEMGLEE) 100 UNIT/ML Pen Inject 12 Units into the skin at bedtime. 15 mL 2   Insulin Pen Needle (NOVOFINE PLUS PEN NEEDLE) 32G X 4 MM MISC Use daily to insulin 50 each 1   Insulin Pen Needle 32G X 4 MM MISC Use As Directed 100 each 0   Lancets (ONETOUCH ULTRASOFT) lancets Use as instructed, Check blood sugars twice daily 100 each 12   losartan (COZAAR) 50 MG tablet Take 1 tablet (50 mg total) by mouth daily. 30 tablet 1   spironolactone (ALDACTONE) 25 MG tablet Take 0.5 tablets (12.5 mg total) by mouth daily. 15 tablet 1   No current facility-administered medications for this encounter.    Allergies  Allergen Reactions   Zofran [Ondansetron Hcl] Nausea Only    Abdominal pain      Social History   Socioeconomic History   Marital status: Married    Spouse name: Renda Rolls   Number of children: 1   Years of education: Not on file   Highest education level: High school graduate  Occupational History   Occupation: Training and development officer    Comment: [employer] operational officer   Tobacco Use   Smoking status: Never   Smokeless tobacco: Never  Vaping Use   Vaping Use: Never used  Substance and Sexual Activity   Alcohol use: No   Drug use: No   Sexual activity: Not on file  Other Topics Concern   Not on file  Social History Narrative   Not on file   Social Determinants of Health   Financial Resource Strain: Medium Risk (03/12/2022)   Overall Financial Resource Strain (CARDIA)    Difficulty of Paying Living Expenses: Somewhat hard  Food Insecurity: No Food Insecurity (03/09/2022)   Hunger Vital Sign    Worried About Running Out of Food in the Last Year: Never true    Ran Out of Food in  the Last Year: Never true  Transportation Needs: No Transportation Needs (03/12/2022)   PRAPARE - Hydrologist (Medical): No    Lack of Transportation (Non-Medical): No  Physical Activity: Not on file  Stress: Not on file  Social Connections: Not on file  Intimate Partner Violence: Not At Risk (03/09/2022)   Humiliation, Afraid, Rape, and Kick questionnaire    Fear of  Current or Ex-Partner: No    Emotionally Abused: No    Physically Abused: No    Sexually Abused: No      Family History  Problem Relation Age of Onset   Heart disease Mother 54   Arthritis Mother    Heart disease Father 43   Stroke Father    Healthy Brother    Heart disease Maternal Grandmother    Diabetes Maternal Uncle     Vitals:   03/28/22 1358  BP: (!) 170/102  Pulse: 84  SpO2: 100%  Weight: 75.9 kg (167 lb 6.4 oz)    PHYSICAL EXAM: General:  Well appearing. HEENT: normal Neck: supple. no JVD. Carotids 2+ bilat; no bruits.  Cor: PMI nondisplaced. Regular rate & rhythm. No rubs, gallops or murmurs. Lungs: clear Abdomen: soft, nontender, nondistended.  Extremities: no cyanosis, clubbing, rash, edema Neuro: alert & oriented x 3, cranial nerves grossly intact. moves all 4 extremities w/o difficulty. Affect pleasant.  ECG: SR 83 bpm,    ASSESSMENT & PLAN: HFrEF/NICM -Echo 03/10/22: EF 25-30%, mild LVH, grade I DD, RV okay, mild MR -R/LHC 03/11/22: Nonobstructive CAD (most severe lesion 60-70% RCA), RA mean 2, PA mean 20, PCWP mean 14, LVEDP 21, Fick CI 2.6, Thermo CI 1.9 -Suspected etiology HTN.   Will obtain cMRI to r/o other potential etiologies (ie myocarditis, infiltrative disease). No hx substance abuse or family hx of CHF. -Will eventually need sleep study NYHA I-II GDMT  Diuretic-Furosemide 20 mg PRN BB-Coreg 25 BID, would not increase any further d/t concern for low-output on recent RHC. May need to cut dose back in future. Ace/ARB/ARNI-Will switch Losartan  50 mg daily to Entresto 97/103 mg BID if Scr stable on today's labs. He was given copay card. MRA-Continue spiro 12.5 mg daily SGLT2i-Continue Farxiga 10 mg daily, given copay card -CMET, BNP, CBC today  HTN -Not controlled.  -Meds as above  DM II -A1c 10.7% -Insulin dependent. Has been referred to Endocrine but reports trouble getting an appointment -Continue Farxiga  HLD -LDL 124 in 12/23 and Lipoprotein (a) 351 -Atorvastatin recently increased to 80 mg daily -Management per Cardiology  Recent AKI -Scr baseline seems to be around 1.2, up to 1.65 day after LHC (? CIN) -Labs today -Needs better control of HTN and DM  Referred to HFSW (PCP, Medications, Transportation, ETOH Abuse, Drug Abuse, Insurance, Museum/gallery curator ): No Refer to Pharmacy: No Refer to Home Health: Yes on No Refer to Advanced Heart Failure Clinic: Yes  Refer to General Cardiology: No, already established  Follow up Dr. Daniel Nones in 4 weeks, will follow along with General Cardiology

## 2022-03-28 NOTE — Patient Instructions (Addendum)
Labs drawn today - will call if abnormal. If labs ok today, will stop Losartan and start Entresto 97/103 mg twice daily. Provided patient with Entresto 30 day free coupon to present at pharmacy if this should occur.  Provided patient with Wilder Glade co-pay card.  Will need outpatient cardiac MRI. Order placed, will schedule when prior authorization obtained. Return to see Dr. Daniel Nones in [facility] in 4 weeks.  Please call Heart Failure Clinic at [phone] if any questions or concerns prior to next appointment.

## 2022-03-28 NOTE — Telephone Encounter (Signed)
Call attempted to confirm [facility] TOC appt at 2 pm on 03/28/22. HIPPA appropriate VM left with callback number.    Earnestine Leys, BSN, Clinical cytogeneticist Only

## 2022-03-28 NOTE — Addendum Note (Signed)
Encounter addended by: Joette Catching, PA-C on: 03/28/2022 5:14 PM  Actions taken: Clinical Note Signed

## 2022-03-29 ENCOUNTER — Ambulatory Visit (INDEPENDENT_AMBULATORY_CARE_PROVIDER_SITE_OTHER): Payer: Commercial Managed Care - PPO | Admitting: Family Medicine

## 2022-03-29 ENCOUNTER — Other Ambulatory Visit (HOSPITAL_COMMUNITY): Payer: Self-pay

## 2022-03-29 ENCOUNTER — Encounter: Payer: Self-pay | Admitting: Family Medicine

## 2022-03-29 VITALS — BP 130/85 | HR 83 | Temp 98.4°F | Ht 70.0 in | Wt 168.5 lb

## 2022-03-29 DIAGNOSIS — I10 Essential (primary) hypertension: Secondary | ICD-10-CM | POA: Diagnosis not present

## 2022-03-29 DIAGNOSIS — E1165 Type 2 diabetes mellitus with hyperglycemia: Secondary | ICD-10-CM

## 2022-03-29 DIAGNOSIS — Z794 Long term (current) use of insulin: Secondary | ICD-10-CM | POA: Diagnosis not present

## 2022-03-29 MED ORDER — INSULIN GLARGINE-YFGN 100 UNIT/ML ~~LOC~~ SOPN: 15.0000 [IU] | PEN_INJECTOR | Freq: Every day | SUBCUTANEOUS | 2 refills | Status: DC

## 2022-03-29 MED ORDER — FREESTYLE LIBRE 2 SENSOR MISC
0 refills | Status: DC
  Filled 2022-03-29: qty 1, 14d supply, fill #0

## 2022-03-29 MED ORDER — FREESTYLE LIBRE 2 READER DEVI
0 refills | Status: DC
  Filled 2022-03-29: qty 1, 90d supply, fill #0

## 2022-03-29 NOTE — Progress Notes (Signed)
Subjective:   Chief Complaint  Patient presents with   Follow-up    Diabetes    John [name] is a 57 y.o. male here for follow-up of diabetes.   John [name] has not been checking his sugars routinely.  Patient does require insulin.  Semglee 12 u qhs Medications include: Farxiga 10 mg/d Diet is much healthier.  Exercise: wt resistance exercise, walking  Hypertension Patient presents for hypertension follow up. He does not monitor home blood pressures. He is compliant with medications- Coreg 25 mg bid, losartan 50 mg/d, Aldactone 25 mg/d. Patient has these side effects of medication: none Diet/exercise as above. No CP or SOB.   Past Medical History:  Diagnosis Date   Asthma    Diabetes mellitus without complication ([place])    History of chicken pox    History of DVT (deep vein thrombosis)    s/p trauma of R leg -- 2014. No other history of DVT   History of kidney stones    Hypertension    Lazy eye of left side      Related testing: Retinal exam: Done Pneumovax: done  Objective:  BP 130/85 (BP Location: Left Arm, Patient Position: Sitting, Cuff Size: Normal)   Pulse 83   Temp 98.4 F (36.9 C) (Oral)   Ht 5\' 10"  (1.778 m)   Wt 168 lb 8 oz (76.4 kg)   SpO2 97%   BMI 24.18 kg/m  General:  Well developed, well nourished, in no apparent distress Lungs:  CTAB, no access msc use Cardio:  RRR, no bruits, no LE edema Psych: Age appropriate judgment and insight  Assessment:   Type 2 diabetes mellitus with hyperglycemia, with long-term current use of insulin (HCC) - Plan: Comprehensive metabolic panel, Lipid panel, CBC  Essential hypertension   Plan:   Chronic, probably unstable.  We will check above labs in a couple weeks.  We need to follow-up on a few things we would be too soon to check today.  We will increase his basal insulin from 12 units nightly to 15 units nightly.  Continue Farxiga 10 mg daily.  Continue to monitor sugars at home.  Counseled on diet and  exercise. Chronic, stable.  Continue losartan 50 mg daily until he transitions to Entresto, Coreg 25 mg twice daily, spironolactone 25 mg daily. F/u pending the above. The patient voiced understanding and agreement to the plan.  [place], DO 03/29/22 4:01 PM

## 2022-03-29 NOTE — Patient Instructions (Addendum)
Keep the diet clean and stay active.  Send me some readings in 2 weeks.   We are increasing the Semglee (insulin) to 15 units nightly from 12 units.   Let us know if you need anything.

## 2022-04-01 ENCOUNTER — Ambulatory Visit (INDEPENDENT_AMBULATORY_CARE_PROVIDER_SITE_OTHER): Payer: Commercial Managed Care - PPO | Admitting: Podiatry

## 2022-04-01 ENCOUNTER — Ambulatory Visit (INDEPENDENT_AMBULATORY_CARE_PROVIDER_SITE_OTHER): Payer: Commercial Managed Care - PPO

## 2022-04-01 ENCOUNTER — Other Ambulatory Visit (HOSPITAL_COMMUNITY): Payer: Self-pay

## 2022-04-01 VITALS — BP 176/110 | HR 84 | Temp 96.4°F | Resp 18

## 2022-04-01 DIAGNOSIS — E0843 Diabetes mellitus due to underlying condition with diabetic autonomic (poly)neuropathy: Secondary | ICD-10-CM | POA: Diagnosis not present

## 2022-04-01 DIAGNOSIS — L97512 Non-pressure chronic ulcer of other part of right foot with fat layer exposed: Secondary | ICD-10-CM

## 2022-04-01 MED ORDER — DOXYCYCLINE HYCLATE 100 MG PO TABS
100.0000 mg | ORAL_TABLET | Freq: Two times a day (BID) | ORAL | 0 refills | Status: DC
  Filled 2022-04-01: qty 20, 10d supply, fill #0

## 2022-04-01 NOTE — Progress Notes (Signed)
Chief Complaint  Patient presents with   Diabetic Ulcer    Diabetic Ulcer right hallux , A1c-10 BG- not taking, Drainage, and odor     Subjective:  57 y.o. male with PMHx of diabetes mellitus, PSxHx left great toe amputation 05/16/2021 presenting today for follow-up evaluation of an ulcer to the plantar aspect of the right great toe.  Patient states that over the last 2 months there has been improvement of his wound however over the past few weeks the wound has deteriorated significantly.  Now presenting with odor.  Presenting for further treatment evaluation  Past Medical History:  Diagnosis Date   Asthma    Diabetes mellitus without complication ([place])    History of chicken pox    History of DVT (deep vein thrombosis)    s/p trauma of R leg -- 2014. No other history of DVT   History of kidney stones    Hypertension    Lazy eye of left side     Past Surgical History:  Procedure Laterality Date   AMPUTATION TOE Left 05/16/2021   Procedure: AMPUTATION LEFT GREAT  TOE;  Surgeon: Trula Slade, DPM;  Location: [facility];  Service: Podiatry;  Laterality: Left;   EYE SURGERY     RIGHT/LEFT HEART CATH AND CORONARY ANGIOGRAPHY N/A 03/11/2022   Procedure: RIGHT/LEFT HEART CATH AND CORONARY ANGIOGRAPHY;  Surgeon: Nelva Bush, MD;  Location: [facility];  Service: Cardiovascular;  Laterality: N/A;    Allergies  Allergen Reactions   Zofran [Ondansetron Hcl] Nausea Only    Abdominal pain     RT foot 02/04/2022     RT foot 04/01/2022  Objective/Physical Exam General: The patient is alert and oriented x3 in no acute distress.  Dermatology:  Ulcer measures approximately 2.0 x 1.2 x 0.3 cm.  Granular wound base.  Periwound intact and macerated.  No exposed bone muscle tendon ligament joint.  The ulcer also appears to extend around the medial aspect of the toe.  Please see above noted photo.  Vascular: Palpable pedal pulses bilaterally. No edema or erythema noted.  Capillary refill within normal limits.  Clinically there is no indication or concern for vascular compromise  Neurological: Epicritic and protective threshold diminished bilaterally.   Musculoskeletal Exam: History of prior amputation left great toe 05/16/2021  Radiographic exam RT foot 04/01/2022: Normal osseous mineralization.  No obvious sign of osseous irregularity or erosions around the great toe.  There does appear to be an accessory IPJ bone visualized on AP and medial oblique views.    Assessment: 1.  Ulcer right great toe secondary to diabetes mellitus 2. diabetes mellitus w/ peripheral neuropathy 3. PSxHx amputation LT great toe 05/16/2021 4.  Pain due to onychomycosis of toenails both  Plan of Care:  1. Patient was evaluated.  2. medically necessary excisional debridement including subcutaneous tissue was performed using a tissue nipper and a chisel blade. Excisional debridement of all the necrotic nonviable tissue down to healthy bleeding viable tissue was performed with post-debridement measurements same as pre-. 3. Continue diabetic shoes with insoles 4.  Order placed today for MRI toes RT foot without contrast 5.  Prescription for doxycycline 100 mg 2 times daily #20 6.  Return to clinic after MRI to review results and discuss further treatment options which will likely include surgery, either elective to salvage the toe versus amputation depending on the MRI findings  *Working at the [employer] as a [place], DPM [facility] [employer] [facility]  [employer]  Dr. Felecia Shelling, DPM    [address] [place], [place] [address]                Office [phone]  Fax [phone]

## 2022-04-02 ENCOUNTER — Encounter (HOSPITAL_COMMUNITY): Payer: Self-pay

## 2022-04-04 ENCOUNTER — Other Ambulatory Visit (HOSPITAL_COMMUNITY): Payer: Self-pay

## 2022-04-05 ENCOUNTER — Other Ambulatory Visit (HOSPITAL_COMMUNITY): Payer: Self-pay

## 2022-04-07 ENCOUNTER — Encounter: Payer: Self-pay | Admitting: Physician Assistant

## 2022-04-07 NOTE — Progress Notes (Addendum)
Cardiology Office Note    Date:  04/09/2022   ID:  John [name], DOB December 25, 1965, MRN [id number]  PCP:  [name], DO  Cardiologist:  Candee Furbish, MD  Electrophysiologist:  None   Chief Complaint: f/u CHF  History of Present Illness:   John [name] is a 57 y.o. 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 He works as a Training and development officer at [employer]. His wife previously worked in Corporate treasurer here at [employer].  He was seen at urgent care late 2023 and later ED with elevated BP and cough, had not taken his BP medications.  At ED visit he was treated for possible bronchitis and given IV lasix for suspected CHF. Later admitted to [facility] 03/09/22 for evaluation of suspected enteritis and elevated troponin. HS troponin mildly elevated with flat trend, 183>201>192>211.  2D echo 03/10/22 EF 25-30%, global HK, G1DD, moderate LAE, mild RAE, mild MR. Cath 03/11/22 showed nonobstructive CAD with 60-70% small non-dominant RCA, 20-30% prox LAD, mild luminal irregularities in ramus, upper normal to mildly elevated left heart and pulmonary artery pressures, normal R heart filling pressures, normal Fick CO/index, decreased thermodilution cardiac output/index. LV dysfunction felt due to hypertensive cardiomyopathy. No h/o substance abuse or family hx of CHF. During admisison his peak Cr was 1.65, f/u value 03/28/21 1.36 (prior baseline appears 1.2-1.6. He is being followed by the AHF clinic with plan for cMRI. Per their last note, pt is on "Coreg 25 BID, would not increase any further d/t concern for low-output on recent RHC. May need to cut dose back in future." They recommended to change losartan to [facility] once labs proved stable but patient was unable to be reached to arrange  this. He and his wife do not recall getting a call regarding this.  He is seen for follow-up today feeling well without any new chest pain, SOB, edema, weight gain, orthopnea or palpitations. His blood pressure has been running around [id number] systolic. He does snore at night and has not had prior evaluation for sleep apnea, though suggested in last CHF visit. He is adherent with his medications. He has not had to take Lasix at all since last OV (only rx'd PRN).  Labwork independently reviewed: 03/2022 BNP 252, Hgb 12.2, plt 159, K 3.6, Cr 1.36, albumin 3.4, AST ALT OK 02/2022 Lpa 351, Hgb 13.4, Mg 2.2, LDL 124, A1c 10.7, UA with 30 protein, UDS neg.  Past History   Past Medical History:  Diagnosis Date   Asthma    CAD in native artery    Chronic HFrEF (heart failure with reduced ejection fraction) (HCC)    CKD (chronic kidney disease) stage 2, GFR 60-89 ml/min    Diabetic foot ulcer (HCC)    History of chicken pox    History of DVT (deep vein thrombosis)    s/p trauma of R leg -- 2014. No other history of DVT   History of kidney stones    Hypertension    Lazy eye of left side    NICM (nonischemic cardiomyopathy) ([place])    Uncontrolled diabetes mellitus with hyperglycemia [facility])     Past Surgical History:  Procedure Laterality Date   AMPUTATION TOE Left 05/16/2021   Procedure: AMPUTATION LEFT GREAT  TOE;  Surgeon: Trula Slade, DPM;  Location:  [facility] OR;  Service: Podiatry;  Laterality: Left;   EYE SURGERY     RIGHT/LEFT HEART CATH AND CORONARY ANGIOGRAPHY N/A 03/11/2022   Procedure: RIGHT/LEFT HEART CATH AND CORONARY ANGIOGRAPHY;  Surgeon: Yvonne Kendall, MD;  Location: [facility] INVASIVE CV LAB;  Service: Cardiovascular;  Laterality: N/A;    Current Medications: Current Meds  Medication Sig   albuterol (VENTOLIN HFA) 108 (90 Base) MCG/ACT inhaler Inhale 2 puffs into the lungs every 4 (four) hours as needed for wheezing or shortness of breath.   aspirin 81 MG chewable tablet Chew 1  tablet (81 mg total) by mouth daily.   atorvastatin (LIPITOR) 80 MG tablet Take 1 tablet (80 mg total) by mouth daily.   blood glucose meter kit and supplies KIT Dispense based on patient and insurance preference. Use up to four times daily as directed. (FOR ICD-9 250.00, 250.01).   carvedilol (COREG) 25 MG tablet Take 1 tablet (25 mg total) by mouth 2 (two) times daily with a meal.   Continuous Blood Gluc Receiver (FREESTYLE LIBRE 2 READER) DEVI use to monitor blood sugar   Continuous Blood Gluc Sensor (FREESTYLE LIBRE 2 SENSOR) MISC Use daily to check blood sugar.   dapagliflozin propanediol (FARXIGA) 10 MG TABS tablet Take 1 tablet (10 mg total) by mouth daily before breakfast.   doxycycline (VIBRA-TABS) 100 MG tablet Take 1 tablet (100 mg total) by mouth 2 (two) times daily.   furosemide (LASIX) 20 MG tablet Take 1 tablet (20 mg total) by mouth daily as needed for edema or fluid.   gentamicin cream (GARAMYCIN) 0.1 % Apply 1 Application topically 2 (two) times daily.   glucose blood test strip Use as instructed, Check blood sugars twice daily   insulin glargine-yfgn (SEMGLEE) 100 UNIT/ML Pen Inject 15 Units into the skin at bedtime.   Insulin Pen Needle (NOVOFINE PLUS PEN NEEDLE) 32G X 4 MM MISC Use daily to insulin   Insulin Pen Needle 32G X 4 MM MISC Use As Directed   Lancets (ONETOUCH ULTRASOFT) lancets Use as instructed, Check blood sugars twice daily   losartan (COZAAR) 50 MG tablet Take 1 tablet (50 mg total) by mouth daily.   spironolactone (ALDACTONE) 25 MG tablet Take 0.5 tablets (12.5 mg total) by mouth daily.      Allergies:   Zofran Frazier Richards hcl]   Social History   Socioeconomic History   Marital status: Married    Spouse name: [name]   Number of children: 1   Years of education: Not on file   Highest education level: High school graduate  Occupational History   Occupation: Financial risk analyst    Comment: [employer]   Tobacco Use   Smoking status: Never    Smokeless tobacco: Never  Vaping Use   Vaping Use: Never used  Substance and Sexual Activity   Alcohol use: No   Drug use: No   Sexual activity: Not on file  Other Topics Concern   Not on file  Social History Narrative   Not on file   Social Determinants of Health   Financial Resource Strain: Medium Risk (03/12/2022)   Overall Financial Resource Strain (CARDIA)    Difficulty of Paying Living Expenses: Somewhat hard  Food Insecurity: No Food Insecurity (03/09/2022)   Hunger Vital Sign    Worried About Running Out of Food in the Last Year: Never true    Ran Out of Food in the Last Year: Never true  Transportation Needs: No Transportation Needs (03/12/2022)   PRAPARE - Transportation  Lack of Transportation (Medical): No    Lack of Transportation (Non-Medical): No  Physical Activity: Not on file  Stress: Not on file  Social Connections: Not on file     Family History:  The patient's family history includes Arthritis in his mother; Diabetes in his maternal uncle; Healthy in his brother; Heart disease in his maternal grandmother; Heart disease (age of onset: 49) in his father; Heart disease (age of onset: 22) in his mother; Stroke in his father.  ROS:   Please see the history of present illness.  All other systems are reviewed and otherwise negative.    EKG(s)/Additional Testing   EKG:  EKG is not ordered today  CV Studies: Cardiac studies reviewed are outlined and summarized above. Otherwise please see EMR for full report.  Recent Labs: 03/12/2022: Magnesium 2.2 03/28/2022: ALT 21; B Natriuretic Peptide 252.9; BUN 35; Creatinine, Ser 1.36; Hemoglobin 12.2; Platelets 159; Potassium 3.6; Sodium 139  Recent Lipid Panel    Component Value Date/Time   CHOL 183 03/10/2022 0141   TRIG 68 03/10/2022 0141   HDL 45 03/10/2022 0141   CHOLHDL 4.1 03/10/2022 0141   VLDL 14 03/10/2022 0141   LDLCALC 124 (H) 03/10/2022 0141   LDLCALC 119 (H) 10/19/2021 1511    PHYSICAL EXAM:     VS:  BP (!) 148/94   Pulse 78   Ht 5\' 10"  (1.778 m)   Wt 167 lb 3.2 oz (75.8 kg)   SpO2 99%   BMI 23.99 kg/m   BMI: Body mass index is 23.99 kg/m.  GEN: Well nourished, well developed male in no acute distress HEENT: normocephalic, atraumatic Neck: no JVD, carotid bruits, or masses Cardiac: RRR; no murmurs, rubs, or gallops, no edema, 2+ DP pulses bilaterally Respiratory:  clear to auscultation bilaterally, normal work of breathing GI: soft, nontender, nondistended, + BS MS: no deformity or atrophy Skin: warm and dry, no rash Neuro:  Alert and Oriented x 3, Strength and sensation are intact, follows commands Psych: euthymic mood, full affect  Wt Readings from Last 3 Encounters:  04/09/22 167 lb 3.2 oz (75.8 kg)  03/29/22 168 lb 8 oz (76.4 kg)  03/28/22 167 lb 6.4 oz (75.9 kg)     ASSESSMENT & PLAN:   1. Chronic HFrEF/NICM with controlled uncontrolled HTN - euvolemic today and tolerating present med regimen well. Did not recheck BP after first value in clinic as his value today correlates with home BP readings. Continue carvedilol, Farxiga, spironolactone. Change losartan to Entresto 97/103mg  BID and recheck BMET in 2 weeks as previously suggested by CHF team. Has plan for cMRI in process. Regarding HTN, we will check TSH and get sleep study. If these are unrevealing, consider renal artery duplex. Prior UA did not show concern for nephrotic range proteinuria. Will be followed closely by CHF team going forward. Refill spironolactone as requested today.  2. Nonobstructive CAD with HLD - continue ASA, BB, atorvastatin. Refill atorvastatin today. Pt reports that PCP has labs planned later this week for follow-up - looks like they are potentially repeating CMET/CBC/lipid profile at that time. Would recommend attention to goal LDL of <70 especially with concomitant DM.  3. CKD stage 2-3a - recheck BMET today with TSH.  4. Sleep disordered breathing - plan Itamar sleep study.  5.  Diabetic foot ulcer - per podiatry. Will get ABIs for completeness.     Disposition: F/u with CHF team as scheduled in 04/2022. Since primary issue is heart failure we will otherwise plan gen  cards f/u in 6 months.   Medication Adjustments/Labs and Tests Ordered: Current medicines are reviewed at length with the patient today.  Concerns regarding medicines are outlined above. Medication changes, Labs and Tests ordered today are summarized above and listed in the Patient Instructions accessible in Encounters.   Signed, Charlie Pitter, PA-C  04/09/2022 3:49 PM    [place] Phone: [phone]; Fax: [phone]

## 2022-04-08 ENCOUNTER — Other Ambulatory Visit (HOSPITAL_COMMUNITY): Payer: Self-pay

## 2022-04-09 ENCOUNTER — Encounter: Payer: Self-pay | Admitting: Physician Assistant

## 2022-04-09 ENCOUNTER — Other Ambulatory Visit (HOSPITAL_COMMUNITY): Payer: Self-pay

## 2022-04-09 ENCOUNTER — Telehealth: Payer: Self-pay | Admitting: *Deleted

## 2022-04-09 ENCOUNTER — Ambulatory Visit: Payer: Commercial Managed Care - PPO | Attending: Physician Assistant | Admitting: Physician Assistant

## 2022-04-09 VITALS — BP 148/94 | HR 78 | Ht 70.0 in | Wt 167.2 lb

## 2022-04-09 DIAGNOSIS — L97509 Non-pressure chronic ulcer of other part of unspecified foot with unspecified severity: Secondary | ICD-10-CM

## 2022-04-09 DIAGNOSIS — N182 Chronic kidney disease, stage 2 (mild): Secondary | ICD-10-CM

## 2022-04-09 DIAGNOSIS — E785 Hyperlipidemia, unspecified: Secondary | ICD-10-CM

## 2022-04-09 DIAGNOSIS — I428 Other cardiomyopathies: Secondary | ICD-10-CM

## 2022-04-09 DIAGNOSIS — I10 Essential (primary) hypertension: Secondary | ICD-10-CM | POA: Diagnosis not present

## 2022-04-09 DIAGNOSIS — E13621 Other specified diabetes mellitus with foot ulcer: Secondary | ICD-10-CM

## 2022-04-09 DIAGNOSIS — G4730 Sleep apnea, unspecified: Secondary | ICD-10-CM | POA: Diagnosis not present

## 2022-04-09 DIAGNOSIS — I2510 Atherosclerotic heart disease of native coronary artery without angina pectoris: Secondary | ICD-10-CM | POA: Diagnosis not present

## 2022-04-09 DIAGNOSIS — I5022 Chronic systolic (congestive) heart failure: Secondary | ICD-10-CM

## 2022-04-09 MED ORDER — SPIRONOLACTONE 25 MG PO TABS
12.5000 mg | ORAL_TABLET | Freq: Every day | ORAL | 3 refills | Status: DC
  Filled 2022-04-09 – 2022-05-02 (×2): qty 45, 90d supply, fill #0

## 2022-04-09 MED ORDER — ATORVASTATIN CALCIUM 80 MG PO TABS
80.0000 mg | ORAL_TABLET | Freq: Every day | ORAL | 3 refills | Status: DC
  Filled 2022-04-09 – 2022-05-02 (×2): qty 90, 90d supply, fill #0

## 2022-04-09 MED ORDER — ENTRESTO 97-103 MG PO TABS
1.0000 | ORAL_TABLET | Freq: Two times a day (BID) | ORAL | 6 refills | Status: DC
  Filled 2022-04-09: qty 60, 30d supply, fill #0
  Filled 2022-05-02: qty 60, 30d supply, fill #1
  Filled 2022-06-05: qty 60, 30d supply, fill #2
  Filled 2022-07-02: qty 60, 30d supply, fill #3
  Filled 2022-08-04: qty 60, 30d supply, fill #4
  Filled 2022-09-01: qty 60, 30d supply, fill #5
  Filled 2022-10-03: qty 60, 30d supply, fill #6

## 2022-04-09 NOTE — Addendum Note (Signed)
Addended by: Charlie Pitter on: 04/09/2022 04:33 PM   Modules accepted: Orders

## 2022-04-09 NOTE — Patient Instructions (Addendum)
Medication Instructions:  DISCONTINUE Losartan  START Entresto 97/103mg  Take 1 tablet once a day  *If you need a refill on your cardiac medications before your next appointment, please call your pharmacy*   Lab Work: TODAY-BMET & TSH  BMET IN 2 WEEKS  If you have labs (blood work) drawn today and your tests are completely normal, you will receive your results only by: [place] (if you have MyChart) OR A paper copy in the mail If you have any lab test that is abnormal or we need to change your treatment, we will call you to review the results.   Testing/Procedures: Your physician has requested that you have an ankle brachial index (ABI). During this test an ultrasound and blood pressure cuff are used to evaluate the arteries that supply the arms and legs with blood. Allow thirty minutes for this exam. There are no restrictions or special instructions.  Your physician has requested that you have a lower extremity arterial exercise duplex. During this test, exercise and ultrasound are used to evaluate arterial blood flow in the legs. Allow one hour for this exam. There are no restrictions or special instructions.  ITAMAR SLEEP STUDY  Follow-Up: At [facility], you and your health needs are our priority.  As part of our continuing mission to provide you with exceptional heart care, we have created designated Provider Care Teams.  These Care Teams include your primary Cardiologist (physician) and Advanced Practice Providers (APPs -  Physician Assistants and Nurse Practitioners) who all work together to provide you with the care you need, when you need it.  We recommend signing up for the patient portal called "MyChart".  Sign up information is provided on this After Visit Summary.  MyChart is used to connect with patients for Virtual Visits (Telemedicine).  Patients are able to view lab/test results, encounter notes, upcoming appointments, etc.  Non-urgent messages can be sent to  your provider as well.   To learn more about what you can do with MyChart, go to [email or web address].    Your next appointment:   6 month(s)  Provider:   Candee Furbish, MD     Other Instructions

## 2022-04-09 NOTE — Telephone Encounter (Signed)
Itamar ordered by Melina Copa, PA.  Patient was given all instructions and watched educational video. He knows he will receive a call from our office once approved.

## 2022-04-10 ENCOUNTER — Other Ambulatory Visit: Payer: Self-pay | Admitting: Podiatry

## 2022-04-10 DIAGNOSIS — L97512 Non-pressure chronic ulcer of other part of right foot with fat layer exposed: Secondary | ICD-10-CM

## 2022-04-10 DIAGNOSIS — E0843 Diabetes mellitus due to underlying condition with diabetic autonomic (poly)neuropathy: Secondary | ICD-10-CM

## 2022-04-10 LAB — BASIC METABOLIC PANEL
BUN/Creatinine Ratio: 24 — ABNORMAL HIGH (ref 9–20)
BUN: 32 mg/dL — ABNORMAL HIGH (ref 6–24)
CO2: 17 mmol/L — ABNORMAL LOW (ref 20–29)
Calcium: 9.2 mg/dL (ref 8.7–10.2)
Chloride: 106 mmol/L (ref 96–106)
Creatinine, Ser: 1.36 mg/dL — ABNORMAL HIGH (ref 0.76–1.27)
Glucose: 93 mg/dL (ref 70–99)
Potassium: 4.4 mmol/L (ref 3.5–5.2)
Sodium: 138 mmol/L (ref 134–144)
eGFR: 61 mL/min/{1.73_m2} (ref >59)

## 2022-04-10 LAB — TSH: TSH: 1.28 u[IU]/mL (ref 0.450–4.500)

## 2022-04-12 ENCOUNTER — Other Ambulatory Visit (INDEPENDENT_AMBULATORY_CARE_PROVIDER_SITE_OTHER): Payer: Commercial Managed Care - PPO

## 2022-04-12 DIAGNOSIS — E1165 Type 2 diabetes mellitus with hyperglycemia: Secondary | ICD-10-CM | POA: Diagnosis not present

## 2022-04-12 DIAGNOSIS — Z794 Long term (current) use of insulin: Secondary | ICD-10-CM | POA: Diagnosis not present

## 2022-04-12 LAB — CBC
HCT: 43.7 % (ref 39.0–52.0)
Hemoglobin: 14.6 g/dL (ref 13.0–17.0)
MCHC: 33.5 g/dL (ref 30.0–36.0)
MCV: 93.8 fl (ref 78.0–100.0)
Platelets: 232 10*3/uL (ref 150.0–400.0)
RBC: 4.66 Mil/uL (ref 4.22–5.81)
RDW: 13.6 % (ref 11.5–15.5)
WBC: 7.4 10*3/uL (ref 4.0–10.5)

## 2022-04-12 LAB — LIPID PANEL
Cholesterol: 141 mg/dL (ref 0–200)
HDL: 45.9 mg/dL (ref >39.00)
LDL Cholesterol: 85 mg/dL (ref 0–99)
NonHDL: 95.29
Total CHOL/HDL Ratio: 3
Triglycerides: 53 mg/dL (ref 0.0–149.0)
VLDL: 10.6 mg/dL (ref 0.0–40.0)

## 2022-04-12 LAB — COMPREHENSIVE METABOLIC PANEL
ALT: 23 U/L (ref 0–53)
AST: 20 U/L (ref 0–37)
Albumin: 4.3 g/dL (ref 3.5–5.2)
Alkaline Phosphatase: 76 U/L (ref 39–117)
BUN: 36 mg/dL — ABNORMAL HIGH (ref 6–23)
CO2: 22 mEq/L (ref 19–32)
Calcium: 9.2 mg/dL (ref 8.4–10.5)
Chloride: 113 mEq/L — ABNORMAL HIGH (ref 96–112)
Creatinine, Ser: 1.66 mg/dL — ABNORMAL HIGH (ref 0.40–1.50)
GFR: 45.95 mL/min — ABNORMAL LOW (ref >60.00)
Glucose, Bld: 141 mg/dL — ABNORMAL HIGH (ref 70–99)
Potassium: 4.3 mEq/L (ref 3.5–5.1)
Sodium: 144 mEq/L (ref 135–145)
Total Bilirubin: 0.6 mg/dL (ref 0.2–1.2)
Total Protein: 7.9 g/dL (ref 6.0–8.3)

## 2022-04-15 ENCOUNTER — Ambulatory Visit: Payer: Commercial Managed Care - PPO | Admitting: Podiatry

## 2022-04-19 ENCOUNTER — Ambulatory Visit (HOSPITAL_COMMUNITY)
Admission: RE | Admit: 2022-04-19 | Discharge: 2022-04-19 | Disposition: A | Discharge status: Home / Self Care | Payer: Commercial Managed Care - PPO | Source: Ambulatory Visit | Attending: Cardiovascular Disease | Admitting: Cardiovascular Disease

## 2022-04-19 ENCOUNTER — Other Ambulatory Visit: Payer: Self-pay

## 2022-04-19 DIAGNOSIS — I428 Other cardiomyopathies: Secondary | ICD-10-CM | POA: Insufficient documentation

## 2022-04-19 DIAGNOSIS — G4730 Sleep apnea, unspecified: Secondary | ICD-10-CM

## 2022-04-19 DIAGNOSIS — I5022 Chronic systolic (congestive) heart failure: Secondary | ICD-10-CM

## 2022-04-19 DIAGNOSIS — L97509 Non-pressure chronic ulcer of other part of unspecified foot with unspecified severity: Secondary | ICD-10-CM

## 2022-04-19 DIAGNOSIS — E13621 Other specified diabetes mellitus with foot ulcer: Secondary | ICD-10-CM | POA: Insufficient documentation

## 2022-04-19 DIAGNOSIS — E785 Hyperlipidemia, unspecified: Secondary | ICD-10-CM

## 2022-04-19 DIAGNOSIS — I10 Essential (primary) hypertension: Secondary | ICD-10-CM | POA: Diagnosis not present

## 2022-04-19 DIAGNOSIS — N182 Chronic kidney disease, stage 2 (mild): Secondary | ICD-10-CM | POA: Diagnosis not present

## 2022-04-19 DIAGNOSIS — I2510 Atherosclerotic heart disease of native coronary artery without angina pectoris: Secondary | ICD-10-CM | POA: Diagnosis not present

## 2022-04-19 LAB — VAS US ABI WITH/WO TBI
Left ABI: 1.32
Right ABI: 1.31

## 2022-04-20 LAB — BASIC METABOLIC PANEL
BUN/Creatinine Ratio: 27 — ABNORMAL HIGH (ref 9–20)
BUN: 40 mg/dL — ABNORMAL HIGH (ref 6–24)
CO2: 19 mmol/L — ABNORMAL LOW (ref 20–29)
Calcium: 9.5 mg/dL (ref 8.7–10.2)
Chloride: 108 mmol/L — ABNORMAL HIGH (ref 96–106)
Creatinine, Ser: 1.47 mg/dL — ABNORMAL HIGH (ref 0.76–1.27)
Glucose: 116 mg/dL — ABNORMAL HIGH (ref 70–99)
Potassium: 4.2 mmol/L (ref 3.5–5.2)
Sodium: 143 mmol/L (ref 134–144)
eGFR: 56 mL/min/{1.73_m2} — ABNORMAL LOW (ref >59)

## 2022-04-22 NOTE — Telephone Encounter (Signed)
Left voicemail to call the clinic on 04/22/22 @ 0939.

## 2022-04-22 NOTE — Telephone Encounter (Signed)
-----  Message from Charlie Pitter, [place] sent at 04/19/2022  5:27 PM EST ----- Can let pt know LE arterial study was normal, no evidence of blockage in legs. Per 1/16 OV he was supposed to have recheck BMET in 2 weeks so please ensure he is aware to obtain - I see order was placed today but not clear to me if he had this drawn or scheduled. Thanks!

## 2022-04-23 NOTE — Telephone Encounter (Signed)
Left voicemail to call the clinic. 

## 2022-04-23 NOTE — Telephone Encounter (Signed)
-----  Message from Charlie Pitter, PA-C sent at 04/20/2022  9:00 AM EST ----- See also LE arterial duplex result note - the BMET did result. Please let pt know labs have some abnormalities but I think largely stable compared to prior values. Please notify in a phone note or mychart message how home BPs have been running since switching to [place] - would suggest to check at least 3 hours after AM meds to help guide whether we need to add additional medicine/workup between now and CHF clinic visit. Thank you!

## 2022-04-26 ENCOUNTER — Encounter (HOSPITAL_COMMUNITY): Payer: Commercial Managed Care - PPO | Admitting: Cardiology

## 2022-04-28 ENCOUNTER — Ambulatory Visit
Admission: RE | Admit: 2022-04-28 | Discharge: 2022-04-28 | Disposition: A | Discharge status: Home / Self Care | Payer: Commercial Managed Care - PPO | Source: Ambulatory Visit | Attending: Podiatry | Admitting: Podiatry

## 2022-04-28 DIAGNOSIS — R600 Localized edema: Secondary | ICD-10-CM | POA: Diagnosis not present

## 2022-04-28 DIAGNOSIS — L97512 Non-pressure chronic ulcer of other part of right foot with fat layer exposed: Secondary | ICD-10-CM

## 2022-04-30 NOTE — Progress Notes (Signed)
Tried calling patient.  Left message.  Also left a message for our schedulers to schedule a follow-up appointment to review MRI.-Dr. Amalia Hailey

## 2022-05-01 ENCOUNTER — Ambulatory Visit: Payer: Commercial Managed Care - PPO | Admitting: Podiatry

## 2022-05-01 DIAGNOSIS — E0843 Diabetes mellitus due to underlying condition with diabetic autonomic (poly)neuropathy: Secondary | ICD-10-CM

## 2022-05-01 DIAGNOSIS — L97512 Non-pressure chronic ulcer of other part of right foot with fat layer exposed: Secondary | ICD-10-CM | POA: Diagnosis not present

## 2022-05-02 ENCOUNTER — Ambulatory Visit (HOSPITAL_COMMUNITY)
Admission: RE | Admit: 2022-05-02 | Discharge: 2022-05-02 | Disposition: A | Discharge status: Home / Self Care | Payer: Commercial Managed Care - PPO | Source: Ambulatory Visit | Attending: Cardiology | Admitting: Cardiology

## 2022-05-02 ENCOUNTER — Telehealth: Payer: Self-pay | Admitting: Urology

## 2022-05-02 ENCOUNTER — Other Ambulatory Visit (HOSPITAL_COMMUNITY): Payer: Self-pay

## 2022-05-02 ENCOUNTER — Encounter (HOSPITAL_COMMUNITY): Payer: Self-pay | Admitting: Cardiology

## 2022-05-02 VITALS — BP 140/88 | HR 73 | Wt 168.2 lb

## 2022-05-02 DIAGNOSIS — I428 Other cardiomyopathies: Secondary | ICD-10-CM | POA: Diagnosis not present

## 2022-05-02 DIAGNOSIS — K529 Noninfective gastroenteritis and colitis, unspecified: Secondary | ICD-10-CM | POA: Insufficient documentation

## 2022-05-02 DIAGNOSIS — I130 Hypertensive heart and chronic kidney disease with heart failure and stage 1 through stage 4 chronic kidney disease, or unspecified chronic kidney disease: Secondary | ICD-10-CM | POA: Diagnosis not present

## 2022-05-02 DIAGNOSIS — Z79899 Other long term (current) drug therapy: Secondary | ICD-10-CM | POA: Diagnosis not present

## 2022-05-02 DIAGNOSIS — E1122 Type 2 diabetes mellitus with diabetic chronic kidney disease: Secondary | ICD-10-CM | POA: Diagnosis not present

## 2022-05-02 DIAGNOSIS — I5022 Chronic systolic (congestive) heart failure: Secondary | ICD-10-CM | POA: Insufficient documentation

## 2022-05-02 DIAGNOSIS — I2510 Atherosclerotic heart disease of native coronary artery without angina pectoris: Secondary | ICD-10-CM | POA: Diagnosis not present

## 2022-05-02 DIAGNOSIS — I10 Essential (primary) hypertension: Secondary | ICD-10-CM

## 2022-05-02 DIAGNOSIS — I1A0 Resistant hypertension: Secondary | ICD-10-CM | POA: Insufficient documentation

## 2022-05-02 DIAGNOSIS — N182 Chronic kidney disease, stage 2 (mild): Secondary | ICD-10-CM | POA: Diagnosis not present

## 2022-05-02 LAB — BASIC METABOLIC PANEL
Anion gap: 8 (ref 5–15)
BUN: 29 mg/dL — ABNORMAL HIGH (ref 6–20)
CO2: 21 mmol/L — ABNORMAL LOW (ref 22–32)
Calcium: 9.2 mg/dL (ref 8.9–10.3)
Chloride: 108 mmol/L (ref 98–111)
Creatinine, Ser: 1.39 mg/dL — ABNORMAL HIGH (ref 0.61–1.24)
GFR, Estimated: 59 mL/min — ABNORMAL LOW (ref >60)
Glucose, Bld: 104 mg/dL — ABNORMAL HIGH (ref 70–99)
Potassium: 4.1 mmol/L (ref 3.5–5.1)
Sodium: 137 mmol/L (ref 135–145)

## 2022-05-02 LAB — BRAIN NATRIURETIC PEPTIDE: B Natriuretic Peptide: 87.3 pg/mL (ref 0.0–100.0)

## 2022-05-02 MED ORDER — ISOSORB DINITRATE-HYDRALAZINE 20-37.5 MG PO TABS
1.0000 | ORAL_TABLET | Freq: Three times a day (TID) | ORAL | 6 refills | Status: DC
  Filled 2022-05-02: qty 90, 30d supply, fill #0

## 2022-05-02 MED ORDER — SPIRONOLACTONE 25 MG PO TABS
12.5000 mg | ORAL_TABLET | Freq: Every day | ORAL | 3 refills | Status: DC
  Filled 2022-05-02: qty 45, 90d supply, fill #0

## 2022-05-02 MED ORDER — ATORVASTATIN CALCIUM 80 MG PO TABS
80.0000 mg | ORAL_TABLET | Freq: Every day | ORAL | 3 refills | Status: DC
  Filled 2022-05-02: qty 90, 90d supply, fill #0
  Filled 2022-08-04: qty 90, 90d supply, fill #1
  Filled 2022-10-18 – 2022-11-02 (×2): qty 90, 90d supply, fill #2
  Filled 2023-02-03: qty 90, 90d supply, fill #3

## 2022-05-02 NOTE — Patient Instructions (Signed)
START Bidil one tab three times daily  Labs today We will only contact you if something comes back abnormal or we need to make some changes. Otherwise no news is good news!   Your physician has requested that you have a renal artery duplex. During this test, an ultrasound is used to evaluate blood flow to the kidneys. Allow one hour for this exam. Do not eat after midnight the day before and avoid carbonated beverages. Take your medications as you usually do.  Your physician recommends that you schedule a follow-up appointment in: 1 months with the pharmacy team and in 2 months with Dr John [name] and echo  Your physician has requested that you have an echocardiogram. Echocardiography is a painless test that uses sound waves to create images of your heart. It provides your doctor with information about the size and shape of your heart and how well your heart's chambers and valves are working. This procedure takes approximately one hour. There are no restrictions for this procedure. Please do NOT wear cologne, perfume, aftershave, or lotions (deodorant is allowed). Please arrive 15 minutes prior to your appointment time.  At the [facility], you and your health needs are our priority. As part of our continuing mission to provide you with exceptional heart care, we have created designated Provider Care Teams. These Care Teams include your primary Cardiologist (physician) and Advanced Practice Providers (APPs- Physician Assistants and Nurse Practitioners) who all work together to provide you with the care you need, when you need it.   You may see any of the following providers on your designated Care Team at your next follow up: Dr Glori Bickers Dr Loralie Champagne Dr. Roxana Hires, NP Lyda Jester, [place] [facility], [place] Forestine Na, NP Audry Riles, PharmD   Please be sure to bring in all your medications bottles to every appointment.     Thank you for choosing [facility]    If you have any questions or concerns before your next appointment please send [place] a message through [place] or call our office at [phone].    TO LEAVE A MESSAGE FOR THE NURSE SELECT OPTION 2, PLEASE LEAVE A MESSAGE INCLUDING: YOUR NAME DATE OF BIRTH CALL BACK NUMBER REASON FOR CALL**this is important as we prioritize the call backs  YOU WILL RECEIVE A CALL BACK THE SAME DAY AS LONG AS YOU CALL BEFORE 4:00 PM

## 2022-05-02 NOTE — Telephone Encounter (Signed)
Prior Authorization for [facility] sent to [facility] via Phone. Reference # .  READY- NO PA REQ-REF# [id number]

## 2022-05-02 NOTE — Telephone Encounter (Signed)
Left message for the pt he has been approved for Itamar study. Left vm with the PIN# [id number]. Left message if could do the sleep study between tonight and this weekend, if not able to please call me back and let me know when he can do the study.   Called and made the patient aware that he may proceed with the [facility] Sleep Study. PIN # provided to the patient. Patient made aware that he will be contacted after the test has been read with the results and any recommendations. Patient verbalized understanding and thanked me for the call.

## 2022-05-02 NOTE — Progress Notes (Signed)
ADVANCED HEART FAILURE CLINIC NOTE  Referring Physician: [name]*  Primary Care: [name], DO Primary Cardiologist: Candee Furbish, MD  HPI: John [name] is a 57 y.o. male with T2DM, asthma, HTN, CKDII, PAD w/ right diabetic foot ulcer and left great toe amputation that was most recently admitted to [facility] on 03/08/22 for intractable nausea and emesis; during this time he was found have an elevated troponin leading to TTE w/ LVEF 25%-30%; LHC w/ nonobstructive CAD and  TD CI of 1.9L/min/m2. Prior to admission, other than significant nausea/vomiting, John [name] reports no other symptoms; no CP, SOB, LE edema.  Since discharge from the hospital he has been seen in [facility] clinic where GDMT was further uptitrated.    Interval hx:  Since discharge from the hospital he has been doing very well. Asymptomatic with regards to HF; working in the [employer] with no limitations: no SOB, LE edema, CP.   Activity level/exercise tolerance:  NYHA II; doing very well; continuing to work without difficulty.  Orthopnea:  Sleeps on 2 pillows Paroxysmal noctural dyspnea:  no Chest pain/pressure:  no Orthostatic lightheadedness:  no Palpitations:  no Lower extremity edema:  no Presyncope/syncope:  no Cough:  no  Past Medical History:  Diagnosis Date   Asthma    CAD in native artery    Chronic HFrEF (heart failure with reduced ejection fraction) (HCC)    CKD (chronic kidney disease) stage 2, GFR 60-89 ml/min    Diabetic foot ulcer ([place])    History of chicken pox    History of DVT (deep vein thrombosis)    s/p trauma of R leg -- 2014. No other history of DVT   History of kidney stones    Hypertension    Lazy eye of left side    NICM (nonischemic cardiomyopathy) ([place])    Uncontrolled diabetes mellitus with hyperglycemia (HCC)     Current Outpatient Medications  Medication Sig Dispense Refill   albuterol (VENTOLIN HFA) 108 (90 Base) MCG/ACT inhaler Inhale 2 puffs  into the lungs every 4 (four) hours as needed for wheezing or shortness of breath. 6.7 g 1   aspirin 81 MG chewable tablet Chew 81 mg by mouth daily.     atorvastatin (LIPITOR) 80 MG tablet Take 1 tablet (80 mg total) by mouth daily. 90 tablet 3   blood glucose meter kit and supplies KIT Dispense based on patient and insurance preference. Use up to four times daily as directed. (FOR ICD-9 250.00, 250.01). 1 each 0   carvedilol (COREG) 25 MG tablet Take 1 tablet (25 mg total) by mouth 2 (two) times daily with a meal. 180 tablet 2   Continuous Blood Gluc Receiver (FREESTYLE LIBRE 2 READER) DEVI use to monitor blood sugar 1 each 0   Continuous Blood Gluc Sensor (FREESTYLE LIBRE 2 SENSOR) MISC Use daily to check blood sugar. 1 each 0   dapagliflozin propanediol (FARXIGA) 10 MG TABS tablet Take 1 tablet (10 mg total) by mouth daily before breakfast. 90 tablet 2   furosemide (LASIX) 20 MG tablet Take 1 tablet (20 mg total) by mouth daily as needed for edema or fluid. 30 tablet 0   gentamicin cream (GARAMYCIN) 0.1 % Apply 1 Application topically 2 (two) times daily. 30 g 1   glucose blood test strip Use as instructed, Check blood sugars twice daily 100 each 12   insulin glargine-yfgn (SEMGLEE) 100 UNIT/ML Pen Inject 15 Units into the skin at bedtime. 15 mL 2  Insulin Pen Needle (NOVOFINE PLUS PEN NEEDLE) 32G X 4 MM MISC Use daily to insulin 50 each 1   Insulin Pen Needle 32G X 4 MM MISC Use As Directed 100 each 0   Lancets (ONETOUCH ULTRASOFT) lancets Use as instructed, Check blood sugars twice daily 100 each 12   sacubitril-valsartan (ENTRESTO) 97-103 MG Take 1 tablet by mouth 2 (two) times daily. 60 tablet 6   spironolactone (ALDACTONE) 25 MG tablet Take 0.5 tablets (12.5 mg total) by mouth daily. 45 tablet 3   No current facility-administered medications for this encounter.    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   Tobacco Use   Smoking status: Never   Smokeless tobacco: Never  Vaping Use   Vaping Use: Never used  Substance and Sexual Activity   Alcohol use: No   Drug use: No   Sexual activity: Not on file  Other Topics Concern   Not on file  Social History Narrative   Not on file   Social Determinants of Health   Financial Resource Strain: Medium Risk (03/12/2022)   Overall Financial Resource Strain (CARDIA)    Difficulty of Paying Living Expenses: Somewhat hard  Food Insecurity: No Food Insecurity (03/09/2022)   Hunger Vital Sign    Worried About Running Out of Food in the Last Year: Never true    Ran Out of Food in the Last Year: Never true  Transportation Needs: No Transportation Needs (03/12/2022)   PRAPARE - Hydrologist (Medical): No    Lack of Transportation (Non-Medical): No  Physical Activity: Not on file  Stress: Not on file  Social Connections: Not on file  Intimate Partner Violence: Not At Risk (03/09/2022)   Humiliation, Afraid, Rape, and Kick questionnaire    Fear of Current or Ex-Partner: No    Emotionally Abused: No    Physically Abused: No    Sexually Abused: No      Family History  Problem Relation Age of Onset   Heart disease Mother 24   Arthritis Mother    Heart disease Father 10   Stroke Father    Healthy Brother    Heart disease Maternal Grandmother    Diabetes Maternal Uncle     PHYSICAL EXAM: Vitals:   05/02/22 1508  BP: (!) 140/88  Pulse: 73  SpO2: 100%   GENERAL: Well nourished, well developed, and in no apparent distress at rest.  HEENT: Negative for arcus senilis or xanthelasma. There is no scleral icterus.  The mucous membranes are pink and moist.    NECK: Supple, No masses. Normal carotid upstrokes without bruits. No masses or thyromegaly.    CHEST: There are no chest wall deformities. There is no chest wall tenderness. Respirations are unlabored.  Lungs- cta b/l CARDIAC:  JVP: 5-7 cm H2O         Normal S1, S2  Normal rate with regular rhythm. No murmurs, rubs or gallops.  Pulses are 2+ and symmetrical in upper and lower extremities. no edema.  ABDOMEN: Soft, non-tender, non-distended. There are no masses or hepatomegaly. There are normal  bowel sounds.  EXTREMITIES: Warm and well perfused with no cyanosis, clubbing.  LYMPHATIC: No axillary or supraclavicular lymphadenopathy.  NEUROLOGIC: Patient is oriented x3 with no focal or lateralizing neurologic deficits.  PSYCH: Patients affect is appropriate, there is no evidence of anxiety or depression.  SKIN: Warm and dry; no lesions or wounds.   DATA REVIEW  ECG: NSR w/ LVH  ECHO: LVEF 25%-30%; RVF is normal. Mild-moderate LVH.   CATH:03/11/22 Non-obstructive coronary artery disease, consistent with nonischemic cardiomyopathy.  Most severe lesion is a 60-70% stenosis involving small, non-dominant RCA.  There is a 20-30% proximal LAD stenosis as well as mild luminal irregularities in the ramus intermedius. Upper normal to mildly elevated left heart and pulmonary artery pressures (PCWP 14 mmHg, LVEDP 18 mmHg, mean PA 22 mmHg). Normal right heart filling pressures (RA/RVEDP 6 mmHg). Normal Fick cardiac output/index (CO 4.8 L/min, 2.6 L/min/m^2). Decreased thermodilution cardiac output/index (CO 3.5 L/min, 1.9 L/min/m^2).   ASSESSMENT & PLAN:  Heart Failure with reduced EF Etiology of JJ:[id number] cardiomyopathy diagnosed at the time significant enteritis, poor PO intake and intractable N/V; possibly viral, however, cannot r/o hypertensive cardiomyopathy w/ history of uncontrolled HTN and LVH on TTE and ECG. No significant family history; CMR ordered & pending.  NYHA class / AHA  Stage:II, doing very well.  Volume status & Diuretics: Euvolemic.  Vasodilators:Entresto 97/103 BID, start Bidil 1 tab TID Beta-Blocker:coreg 25mg  BID SWF:UXNATFTDDUKGUR 25mg  daily Cardiometabolic:jardiance 10mg  daily Devices therapies & Valvulopathies:not currently indicated; repeat TTE in 2-56m Advanced therapies:not indicated.   2. Resistant HTN - coreg 25mg  BID, entresto 97/103 BID.  - start bidil 1 tab TID  3. CKD - improving - repeat labs today   4. PAD - normal ABIs.  - distal extremity wounds 2/2 diabetes.   5. T2DM - A1C 10.5  Opal Mckellips Advanced Heart Failure Mechanical Circulatory Support

## 2022-05-02 NOTE — Telephone Encounter (Signed)
DOS - 05/10/22  BONE BIOPSY RIGHT GREAT TOE --- 20240 EXOSTECTOMY RIGHT GREAT TOE --- 62836  AETNA EFFECTIVE DATE 03/25/22   PER AETNAS AUTOMATIVE SYSTEM FOR CPT CODES 62947 AND 65465 NO PRIOR AUTH IS REQUIRED.  REF # [id number]

## 2022-05-06 ENCOUNTER — Ambulatory Visit: Payer: Commercial Managed Care - PPO | Admitting: Family Medicine

## 2022-05-06 ENCOUNTER — Other Ambulatory Visit (HOSPITAL_COMMUNITY): Payer: Self-pay

## 2022-05-06 ENCOUNTER — Encounter: Payer: Self-pay | Admitting: Family Medicine

## 2022-05-06 ENCOUNTER — Other Ambulatory Visit: Payer: Self-pay | Admitting: Family Medicine

## 2022-05-06 VITALS — BP 120/82 | HR 76 | Temp 97.7°F | Ht 70.0 in | Wt 165.0 lb

## 2022-05-06 DIAGNOSIS — E0843 Diabetes mellitus due to underlying condition with diabetic autonomic (poly)neuropathy: Secondary | ICD-10-CM

## 2022-05-06 DIAGNOSIS — Z01818 Encounter for other preprocedural examination: Secondary | ICD-10-CM

## 2022-05-06 MED ORDER — TRESIBA FLEXTOUCH 100 UNIT/ML ~~LOC~~ SOPN
15.0000 [IU] | PEN_INJECTOR | Freq: Every day | SUBCUTANEOUS | 1 refills | Status: DC
  Filled 2022-05-06: qty 12, 80d supply, fill #0
  Filled 2022-07-19: qty 12, 80d supply, fill #1

## 2022-05-06 NOTE — Patient Instructions (Signed)
Hold aspirin until surgeons say it is safe to resume.  Keep the diet clean and stay active.  Let us know if you need anything.

## 2022-05-06 NOTE — Progress Notes (Signed)
Subjective:   Chief Complaint  Patient presents with   Medical Clearance    Refill insulin    John [name]  is here for a Pre-operative physical at the request of Dr. Amalia Hailey.   He  is having exostectomy of the right great toe surgery on 05/10/22.  He is here with his spouse.  Personal or family hx of adverse outcome to anesthesia? No  Chipped, cracked, missing, or loose teeth? 1 tooth on upper R side Decreased ROM of neck? No  Able to walk up 2 flights of stairs without becoming significantly short of breath or having chest pain? Yes  Revised Goldman Criteria: High Risk Surgery (intraperitoneal, intrathoracic, aortic): No  Ischemic heart disease (Prior MI, +excercise stress test, angina, nitrate use, Qwave): No  History of heart failure: Yes  History of cerebrovascular disease: No  History of diabetes: Yes  Insulin therapy for DM: Yes  Preoperative Cr >2.0: No    Patient Active Problem List   Diagnosis Date Noted   Acute HFrEF (heart failure with reduced ejection fraction) ([place]) 03/11/2022   Heart failure ([place]) 03/11/2022   Elevated troponin 03/09/2022   Enteritis 03/09/2022   Intermittent asthma 03/09/2022   Nausea & vomiting 05/17/2021   Positive D dimer 05/16/2021   COVID-19 virus infection 05/15/2021   Hyponatremia 05/15/2021   Normocytic anemia 05/15/2021   Osteomyelitis of great toe of left foot ([place]) 05/14/2021   CKD (chronic kidney disease) stage 3, GFR 30-59 ml/min ([place]) 05/14/2021   Diabetes mellitus due to underlying condition with diabetic autonomic neuropathy, unspecified whether long term insulin use ([place]) 07/24/2020   Hyperlipidemia LDL goal <100 12/06/2015   Essential hypertension 04/06/2012   Type 2 diabetes mellitus with hyperglycemia, with long-term current use of insulin ([place]) 03/20/2012   History of DVT (deep vein thrombosis) 03/19/2012   Past Medical History:  Diagnosis Date   Asthma    CAD in native artery    Chronic HFrEF (heart failure with reduced  ejection fraction) (HCC)    CKD (chronic kidney disease) stage 2, GFR 60-89 ml/min    Diabetic foot ulcer ([place])    History of chicken pox    History of DVT (deep vein thrombosis)    s/p trauma of R leg -- 2014. No other history of DVT   History of kidney stones    Hypertension    Lazy eye of left side    NICM (nonischemic cardiomyopathy) ([place])    Uncontrolled diabetes mellitus with hyperglycemia [facility])     Past Surgical History:  Procedure Laterality Date   AMPUTATION TOE Left 05/16/2021   Procedure: AMPUTATION LEFT GREAT  TOE;  Surgeon: Trula Slade, DPM;  Location: [place];  Service: Podiatry;  Laterality: Left;   EYE SURGERY     RIGHT/LEFT HEART CATH AND CORONARY ANGIOGRAPHY N/A 03/11/2022   Procedure: RIGHT/LEFT HEART CATH AND CORONARY ANGIOGRAPHY;  Surgeon: Nelva Bush, MD;  Location: [place] CV LAB;  Service: Cardiovascular;  Laterality: N/A;    Current Outpatient Medications  Medication Sig Dispense Refill   albuterol (VENTOLIN HFA) 108 (90 Base) MCG/ACT inhaler Inhale 2 puffs into the lungs every 4 (four) hours as needed for wheezing or shortness of breath. 6.7 g 1   aspirin 81 MG chewable tablet Chew 81 mg by mouth daily.     atorvastatin (LIPITOR) 80 MG tablet Take 1 tablet (80 mg total) by mouth daily. 90 tablet 3   blood glucose meter kit and supplies KIT Dispense based on patient and insurance  preference. Use up to four times daily as directed. (FOR ICD-9 250.00, 250.01). 1 each 0   carvedilol (COREG) 25 MG tablet Take 1 tablet (25 mg total) by mouth 2 (two) times daily with a meal. 180 tablet 2   Continuous Blood Gluc Receiver (FREESTYLE LIBRE 2 READER) DEVI use to monitor blood sugar 1 each 0   Continuous Blood Gluc Sensor (FREESTYLE LIBRE 2 SENSOR) MISC Use daily to check blood sugar. 1 each 0   dapagliflozin propanediol (FARXIGA) 10 MG TABS tablet Take 1 tablet (10 mg total) by mouth daily before breakfast. 90 tablet 2   furosemide (LASIX) 20 MG tablet Take 1  tablet (20 mg total) by mouth daily as needed for edema or fluid. 30 tablet 0   gentamicin cream (GARAMYCIN) 0.1 % Apply 1 Application topically 2 (two) times daily. 30 g 1   glucose blood test strip Use as instructed, Check blood sugars twice daily 100 each 12   insulin glargine-yfgn (SEMGLEE) 100 UNIT/ML Pen Inject 15 Units into the skin at bedtime. 15 mL 2   Insulin Pen Needle (NOVOFINE PLUS PEN NEEDLE) 32G X 4 MM MISC Use daily to insulin 50 each 1   Insulin Pen Needle 32G X 4 MM MISC Use As Directed 100 each 0   isosorbide-hydrALAZINE (BIDIL) 20-37.5 MG tablet Take 1 tablet by mouth 3 (three) times daily. 90 tablet 6   Lancets (ONETOUCH ULTRASOFT) lancets Use as instructed, Check blood sugars twice daily 100 each 12   sacubitril-valsartan (ENTRESTO) 97-103 MG Take 1 tablet by mouth 2 (two) times daily. 60 tablet 6   spironolactone (ALDACTONE) 25 MG tablet Take 1/2 tablet (12.5 mg total) by mouth daily. 45 tablet 3   Allergies  Allergen Reactions   Zofran [Ondansetron Hcl] Nausea Only    Abdominal pain    Family History  Problem Relation Age of Onset   Heart disease Mother 3   Arthritis Mother    Heart disease Father 36   Stroke Father    Healthy Brother    Heart disease Maternal Grandmother    Diabetes Maternal Uncle      Review of Systems:  Constitutional:  no fevers Eye:  no recent significant change in vision Ear:  no hearing loss Nose/Mouth/Throat:  No dental complaints Neck/Thyroid:  no lumps or masses Pulmonary:  No shortness of breath Cardiovascular:  no chest pain Gastrointestinal:  no abdominal pain GU:  negative for dysuria Musculoskeletal/Extremities:  no pain Skin/Integumentary ROS:  no new abnormal skin lesions reported (has nonhealing ulcer on his right great toe) Neurologic:  no HA   Objective:   Vitals:   05/06/22 1527  BP: 120/82  Pulse: 76  Temp: 97.7 F (36.5 C)  TempSrc: Oral  SpO2: 98%  Weight: 165 lb (74.8 kg)  Height: 5' 10"$  (1.778 m)    Body mass index is 23.68 kg/m.  General:  well developed, well nourished, in no apparent distress Skin:  warm, no pallor or diaphoresis on exposed skin surfaces Head:  normocephalic, atraumatic Eyes:  pupils equal and round, sclera anicteric without injection Ears:  canals without lesions, TMs shiny without retraction, no obvious effusion, no erythema Throat/Pharynx:  lips and gingiva without lesion; tongue and uvula midline; non-inflamed pharynx; no exudates or postnasal drainage Neck: neck supple without adenopathy, thyromegaly, or masses, no bruits, no jugular venous distention Lungs:  clear to auscultation, breath sounds equal bilaterally, no respiratory distress Cardio:  regular rate and rhythm without murmurs Abdomen:  abdomen soft, nontender;  bowel sounds normal; no masses, hepatomegaly or splenomegaly Musculoskeletal:  symmetrical muscle groups noted without atrophy or deformity Extremities:  no clubbing, cyanosis, or edema, no deformities, no skin discoloration Neuro:  gait normal; deep tendon reflexes normal and symmetric and alert and oriented to person, place, and time Psych: Age appropriate judgment and insight; normal mood   Assessment:   Pre-op exam  Diabetes mellitus due to underlying condition with diabetic autonomic neuropathy, unspecified whether long term insulin use ([place]) - Plan: Microalbumin / creatinine urine ratio   Plan:   Orders as above. 2.4% risk of cardiac death based on Revised Goldman's criteria.  He is already holding his aspirin before the procedure.  He will restart at the surgeon's discretion. Form filled out stating he is medically optimized for the proposed procedure.  It was faxed today in addition to providing a copy to the patient. He will follow-up as originally scheduled. The patient and his spouse voiced understanding and agreement to the plan.  [place], DO 05/06/22  4:01 PM

## 2022-05-06 NOTE — Progress Notes (Signed)
Left a voicemail with sally at Dr. Rebekah [place] office to request pre op orders.

## 2022-05-08 NOTE — Progress Notes (Signed)
COVID Vaccine received:  []$  No [x]$  Yes Date of any COVID positive Test in last 90 days: None  PCP - Riki Sheer, DO Clearance 05-06-22 Epic note Cardiologist - [facility] - [place], DO ([place] 05-02-22)  Chest x-ray -  EKG - 05-02-2022  epic  Stress Test -  ECHO - 03-10-2022 Cardiac Cath - 03-11-2022 epic  R/LHC by Dr. Saunders Revel  PCR screen: []$  Ordered & Completed                      []$   No Order but Needs PROFEND                      [x]$   N/A for this surgery  Surgery Plan:  [x]$  Ambulatory                            []$  Outpatient in bed                            []$  Admit  Anesthesia:    []$  General  []$  Spinal                           [x]$   Choice []$   MAC  Pacemaker / ICD device [x]$  No []$  Yes        Device order form faxed [x]$  No    []$   Yes      Faxed to:  Spinal Cord Stimulator:[x]$  No []$  Yes      (Remind patient to bring remote DOS) Other Implants:   History of Sleep Apnea? [x]$  No []$  Yes   sleep study was negative CPAP used?- [x]$  No []$  Yes    Does the patient monitor blood sugar? []$  No []$  Yes  []$  N/A Last A1c? 03-10-2022 was 10.7 while in hospital.  Patient has: []$  Pre-DM   []$  DM1  [x]$   DM2 Does patient have a [employer] or Dexacom? []$  No [x]$  Yes   Fasting Blood Sugar Ranges- 90-150 Checks Blood Sugar _continuous times a day  Last dose of GLP1 agonist-  None GLP1 instructions:  Last dose of SGLT-2 inhibitors- Farxiga - SGLT-2 instructions: Hold 72 hours before surgery  Last;  Sunday 05-05-2022 per patient.  Other Diabetic medications/ instructions:  Tresiba (Insulin Degludec) Takes 15 units q hs  Take 50% dose evening/hs,  Take 50% on DOS.   Blood Thinner / Instructions: none Aspirin Instructions: ASA 81 mg   stopped on Sunday  05-05-2022  ERAS Protocol Ordered: []$  No  []$  Yes PRE-SURGERY []$  ENSURE  []$  G2  []$  No Drink Ordered  Patient is to be NPO after:  No ORDERS from Dr. Amalia Hailey available at PST. The patient said he was told to not eat  anything after midnight but he was told to have clears the morning of surgery. He has uncontrolled DM2. Last A1c: 10.7  Comments: no orders as of 05-08-22 at [place], Tamika has called Gay Filler at Dr. Amalia Hailey office. Still had no orders at PST interview on 05-09-22.  Activity level: Patient cannot climb a flight of stairs without difficulty; []$  No CP  []$  No SOB, but would have ______   Patient can / can not perform ADLs without assistance.   Anesthesia review: DM2, HTN, HFrEF- NYHA-II, NICM, anemia, asthma, CKD3, Hx DVT, Lazy eye (Left)  Patient denies shortness of breath, fever, cough and chest pain at PAT appointment.  Patient verbalized understanding and agreement to the Pre-Surgical Instructions that were given to them at this PAT appointment. Patient was also educated of the need to review these PAT instructions again prior to his/her surgery.I reviewed the appropriate phone numbers to call if they have any and questions or concerns.

## 2022-05-08 NOTE — Patient Instructions (Signed)
SURGICAL WAITING ROOM VISITATION Patients having surgery or a procedure may have no more than 2 support people in the waiting area - these visitors may rotate in the visitor waiting room.   Due to an increase in RSV and influenza rates and associated hospitalizations, children ages 69 and under may not visit patients in [place]. If the patient needs to stay at the hospital during part of their recovery, the visitor guidelines for inpatient rooms apply.  PRE-OP VISITATION  Pre-op nurse will coordinate an appropriate time for 1 support person to accompany the patient in pre-op.  This support person may not rotate.  This visitor will be contacted when the time is appropriate for the visitor to come back in the pre-op area.  Please refer to the [facility] website for the visitor guidelines for Inpatients (after your surgery is over and you are in a regular room).  You are not required to quarantine at this time prior to your surgery. However, you must do this: Hand Hygiene often Do NOT share personal items Notify your provider if you are in close contact with someone who has COVID or you develop fever 100.4 or greater, new onset of sneezing, cough, sore throat, shortness of breath or body aches.  If you test positive for Covid or have been in contact with anyone that has tested positive in the last 10 days please notify you surgeon.    Your procedure is scheduled on:  Friday May 10, 2022  Report to [facility] Main Entrance: [place] entrance where the [employer] is available.   Report to admitting at 1:00 PM  +++++Call this number if you have any questions or problems the morning of surgery [phone]  Do not eat food after Midnight the night prior to your surgery/procedure.  After Midnight you may have the following liquids until 12:15   PM DAY OF SURGERY  Clear Liquid Diet Water Black Coffee (sugar ok, NO MILK/CREAM OR CREAMERS)  Tea (sugar ok, NO  MILK/CREAM OR CREAMERS) regular and decaf                             Plain Jell-O  with no fruit (NO RED)                                           Fruit ices (not with fruit pulp, NO RED)                                     Popsicles (NO RED)                                                                  Juice: apple, WHITE grape, WHITE cranberry Sports drinks like Gatorade or Powerade (NO RED)                FOLLOW  ANY ADDITIONAL PRE OP INSTRUCTIONS YOU RECEIVED FROM YOUR SURGEON'S OFFICE!!!   Oral Hygiene is also important to reduce your risk of infection.  Remember - BRUSH YOUR TEETH THE MORNING OF SURGERY WITH YOUR REGULAR TOOTHPASTE  Do NOT smoke after Midnight the night before surgery.  How to Manage Your Diabetes Before and After Surgery  Why is it important to control my blood sugar before and after surgery? Improving blood sugar levels before and after surgery helps healing and can limit problems. A way of improving blood sugar control is eating a healthy diet by:  Eating less sugar and carbohydrates  Increasing activity/exercise  Talking with your doctor about reaching your blood sugar goals High blood sugars (greater than 180 mg/dL) can raise your risk of infections and slow your recovery, so you will need to focus on controlling your diabetes during the weeks before surgery. Make sure that the doctor who takes care of your diabetes knows about your planned surgery including the date and location.  How do I manage my blood sugar before surgery? Check your blood sugar at least 4 times a day, starting 2 days before surgery, to make sure that the level is not too high or low. Check your blood sugar the morning of your surgery when you wake up and every 2 hours until you get to the Short Stay unit. If your blood sugar is less than 70 mg/dL, you will need to treat for low blood sugar: Do not take insulin. Treat a low blood sugar (less than 70 mg/dL) with  cup of  clear juice (cranberry or apple), 4 glucose tablets, OR glucose gel. Recheck blood sugar in 15 minutes after treatment (to make sure it is greater than 70 mg/dL). If your blood sugar is not greater than 70 mg/dL on recheck, call [phone] for further instructions. Report your blood sugar to the short stay nurse when you get to Short Stay.  If you are admitted to the hospital after surgery: Your blood sugar will be checked by the staff and you will probably be given insulin after surgery (instead of oral diabetes medicines) to make sure you have good blood sugar levels. The goal for blood sugar control after surgery is 80-180 mg/dL.   WHAT DO I DO ABOUT MY DIABETES MEDICATION?  THE DAY/ NIGHT BEFORE SURGERY,         FARXIGA- Do Not take 72 hours prior to your surgery.         TRESIBA (Insulin Degludec)  Take 50% dose (7 units) for bedtime dose.   THE MORNING OF SURGERY,  If your CBG is greater than 220 mg/dL, you may take  of your sliding scale  (correction) dose of insulin.    IF you have any questions, call the nurse at [phone]    Take ONLY these medicines the morning of surgery with A SIP OF WATER: Carvedilol, You may take your Albuterol inhaler if needed.    If You have been diagnosed with Sleep Apnea - Bring CPAP mask and tubing day of surgery. We will provide you with a CPAP machine on the day of your surgery.                   You may not have any metal on your body including jewelry, and body piercing  Do not wear  lotions, powders,  cologne, or deodorant  Men may shave face and neck.  Contacts, Hearing Aids, dentures or bridgework may not be worn into surgery. DENTURES WILL BE REMOVED PRIOR TO SURGERY PLEASE DO NOT APPLY "Poly grip" OR ADHESIVES!!!  Patients discharged on the day of surgery will not  be allowed to drive home.  Someone NEEDS to stay with you for the first 24 hours after anesthesia.  Do not bring your home medications to the hospital. The Pharmacy  will dispense medications listed on your medication list to you during your admission in the Hospital.  Special Instructions: Bring a copy of your healthcare power of attorney and living will documents the day of surgery, if you wish to have them scanned into your [facility] Records- EPIC  Please read over the following fact sheets you were given: IF YOU HAVE QUESTIONS ABOUT YOUR PRE-OP INSTRUCTIONS, PLEASE CALL [phone]  ([place])   [place] - Preparing for Surgery Before surgery, you can play an important role.  Because skin is not sterile, your skin needs to be as free of germs as possible.  You can reduce the number of germs on your skin by washing with CHG (chlorahexidine gluconate) soap before surgery.  CHG is an antiseptic cleaner which kills germs and bonds with the skin to continue killing germs even after washing. Please DO NOT use if you have an allergy to CHG or antibacterial soaps.  If your skin becomes reddened/irritated stop using the CHG and inform your nurse when you arrive at Short Stay. Do not shave (including legs and underarms) for at least 48 hours prior to the first CHG shower.  You may shave your face/neck.  Please follow these instructions carefully:  1.  Shower with CHG Soap the night before surgery and the  morning of surgery.  2.  If you choose to wash your hair, wash your hair first as usual with your normal  shampoo.  3.  After you shampoo, rinse your hair and body thoroughly to remove the shampoo.                             4.  Use CHG as you would any other liquid soap.  You can apply chg directly to the skin and wash.  Gently with a scrungie or clean washcloth.  5.  Apply the CHG Soap to your body ONLY FROM THE NECK DOWN.   Do not use on face/ open                           Wound or open sores. Avoid contact with eyes, ears mouth and genitals (private parts).                       Wash face,  Genitals (private parts) with your normal soap.              6.  Wash thoroughly, paying special attention to the area where your  surgery  will be performed.  7.  Thoroughly rinse your body with warm water from the neck down.  8.  DO NOT shower/wash with your normal soap after using and rinsing off the CHG Soap.            9.  Pat yourself dry with a clean towel.            10.  Wear clean pajamas.            11.  Place clean sheets on your bed the night of your first shower and do not  sleep with pets.  ON THE DAY OF SURGERY : Do not apply any lotions/deodorants the morning of surgery.  Please wear  clean clothes to the hospital/surgery center.    FAILURE TO FOLLOW THESE INSTRUCTIONS MAY RESULT IN THE CANCELLATION OF YOUR SURGERY  PATIENT SIGNATURE_________________________________  NURSE SIGNATURE__________________________________  ________________________________________________________________________

## 2022-05-09 ENCOUNTER — Other Ambulatory Visit: Payer: Self-pay

## 2022-05-09 ENCOUNTER — Other Ambulatory Visit (HOSPITAL_COMMUNITY): Payer: Self-pay

## 2022-05-09 ENCOUNTER — Encounter (HOSPITAL_COMMUNITY): Payer: Self-pay

## 2022-05-09 ENCOUNTER — Encounter (HOSPITAL_COMMUNITY)
Admission: RE | Admit: 2022-05-09 | Discharge: 2022-05-09 | Disposition: A | Discharge status: Home / Self Care | Payer: Commercial Managed Care - PPO | Source: Ambulatory Visit | Attending: Podiatry | Admitting: Podiatry

## 2022-05-09 VITALS — BP 125/73 | HR 77 | Temp 98.0°F | Resp 14 | Ht 70.0 in | Wt 163.0 lb

## 2022-05-09 DIAGNOSIS — M79676 Pain in unspecified toe(s): Secondary | ICD-10-CM

## 2022-05-09 DIAGNOSIS — E1122 Type 2 diabetes mellitus with diabetic chronic kidney disease: Secondary | ICD-10-CM | POA: Insufficient documentation

## 2022-05-09 DIAGNOSIS — N182 Chronic kidney disease, stage 2 (mild): Secondary | ICD-10-CM | POA: Insufficient documentation

## 2022-05-09 DIAGNOSIS — L97519 Non-pressure chronic ulcer of other part of right foot with unspecified severity: Secondary | ICD-10-CM | POA: Diagnosis not present

## 2022-05-09 DIAGNOSIS — Z794 Long term (current) use of insulin: Secondary | ICD-10-CM | POA: Diagnosis not present

## 2022-05-09 DIAGNOSIS — I5022 Chronic systolic (congestive) heart failure: Secondary | ICD-10-CM | POA: Insufficient documentation

## 2022-05-09 DIAGNOSIS — E11621 Type 2 diabetes mellitus with foot ulcer: Secondary | ICD-10-CM | POA: Diagnosis not present

## 2022-05-09 DIAGNOSIS — I428 Other cardiomyopathies: Secondary | ICD-10-CM | POA: Diagnosis not present

## 2022-05-09 DIAGNOSIS — I130 Hypertensive heart and chronic kidney disease with heart failure and stage 1 through stage 4 chronic kidney disease, or unspecified chronic kidney disease: Secondary | ICD-10-CM | POA: Insufficient documentation

## 2022-05-09 DIAGNOSIS — I2510 Atherosclerotic heart disease of native coronary artery without angina pectoris: Secondary | ICD-10-CM | POA: Diagnosis not present

## 2022-05-09 DIAGNOSIS — E119 Type 2 diabetes mellitus without complications: Secondary | ICD-10-CM

## 2022-05-09 DIAGNOSIS — Z01812 Encounter for preprocedural laboratory examination: Secondary | ICD-10-CM | POA: Insufficient documentation

## 2022-05-09 HISTORY — DX: Pneumonia, unspecified organism: J18.9

## 2022-05-09 HISTORY — DX: Heart failure, unspecified: I50.9

## 2022-05-09 LAB — CBC
HCT: 36.3 % — ABNORMAL LOW (ref 39.0–52.0)
Hemoglobin: 11.9 g/dL — ABNORMAL LOW (ref 13.0–17.0)
MCH: 31.1 pg (ref 26.0–34.0)
MCHC: 32.8 g/dL (ref 30.0–36.0)
MCV: 94.8 fL (ref 80.0–100.0)
Platelets: 192 10*3/uL (ref 150–400)
RBC: 3.83 MIL/uL — ABNORMAL LOW (ref 4.22–5.81)
RDW: 12.8 % (ref 11.5–15.5)
WBC: 6.9 10*3/uL (ref 4.0–10.5)
nRBC: 0 % (ref 0.0–0.2)

## 2022-05-09 LAB — BASIC METABOLIC PANEL
Anion gap: 9 (ref 5–15)
BUN: 30 mg/dL — ABNORMAL HIGH (ref 6–20)
CO2: 20 mmol/L — ABNORMAL LOW (ref 22–32)
Calcium: 8.9 mg/dL (ref 8.9–10.3)
Chloride: 113 mmol/L — ABNORMAL HIGH (ref 98–111)
Creatinine, Ser: 1.45 mg/dL — ABNORMAL HIGH (ref 0.61–1.24)
GFR, Estimated: 57 mL/min — ABNORMAL LOW (ref >60)
Glucose, Bld: 128 mg/dL — ABNORMAL HIGH (ref 70–99)
Potassium: 3.7 mmol/L (ref 3.5–5.1)
Sodium: 142 mmol/L (ref 135–145)

## 2022-05-09 LAB — HEMOGLOBIN A1C
Hgb A1c MFr Bld: 8.5 % — ABNORMAL HIGH (ref 4.8–5.6)
Mean Plasma Glucose: 197.25 mg/dL

## 2022-05-09 LAB — GLUCOSE, CAPILLARY: Glucose-Capillary: 136 mg/dL — ABNORMAL HIGH (ref 70–99)

## 2022-05-09 NOTE — Anesthesia Preprocedure Evaluation (Addendum)
Anesthesia Evaluation  Patient identified by MRN, date of birth, ID band Patient awake    Reviewed: Allergy & Precautions, NPO status , Patient's Chart, lab work & pertinent test results, reviewed documented beta blocker date and time   History of Anesthesia Complications Negative for: history of anesthetic complications  Airway Mallampati: II  TM Distance: >3 FB Neck ROM: Full    Dental no notable dental hx. (+) Dental Advisory Given   Pulmonary asthma    Pulmonary exam normal        Cardiovascular hypertension, Pt. on medications and Pt. on home beta blockers + DVT  Normal cardiovascular exam  Cardiac Cath 03/11/2022 Conclusions: 1. Non-obstructive coronary artery disease, consistent with nonischemic cardiomyopathy.  Most severe lesion is a 60-70% stenosis involving small, non-dominant RCA.  There is a 20-30% proximal LAD stenosis as well as mild luminal irregularities in the ramus intermedius. 2. Upper normal to mildly elevated left heart and pulmonary artery pressures (PCWP 14 mmHg, LVEDP 18 mmHg, mean PA 22 mmHg). 3. Normal right heart filling pressures (RA/RVEDP 6 mmHg). 4. Normal Fick cardiac output/index (CO 4.8 L/min, 2.6 L/min/m^2). 5. Decreased thermodilution cardiac output/index (CO 3.5 L/min, 1.9 L/min/m^2).   Recommendations: 1. Medical therapy and risk factor modification to prevent progression of coronary artery disease. 2. Escalate goal-directed medical therapy for treatment of HFrEF due to NICM, as tolerated.   Echo 03/10/2022 1. Left ventricular ejection fraction, by estimation, is 25 to 30%. The  left ventricle has severely decreased function. The left ventricle  demonstrates global hypokinesis. There is mild concentric left ventricular  hypertrophy. Left ventricular diastolic   parameters are consistent with Grade I diastolic dysfunction (impaired  relaxation).   2. Right ventricular systolic function is  normal. The right ventricular  size is normal. Tricuspid regurgitation signal is inadequate for assessing  PA pressure.   3. Left atrial size was moderately dilated.   4. Right atrial size was mildly dilated.   5. The mitral valve is normal in structure. Mild mitral valve  regurgitation. No evidence of mitral stenosis.   6. The aortic valve is tricuspid. Aortic valve regurgitation is not  visualized.   7. The inferior vena cava is normal in size with greater than 50%  respiratory variability, suggesting right atrial pressure of 3 mmHg.     Neuro/Psych negative neurological ROS  negative psych ROS   GI/Hepatic Neg liver ROS,GERD  Medicated and Controlled,,  Endo/Other  diabetes, Poorly Controlled, Type 2, Insulin Dependent, Oral Hypoglycemic Agents  a1c 14  Renal/GU Renal InsufficiencyRenal disease  negative genitourinary   Musculoskeletal Osteo L great toe   Abdominal   Peds  Hematology  (+) Blood dyscrasia, anemia Hb 11.3   Anesthesia Other Findings   Reproductive/Obstetrics negative OB ROS                             Anesthesia Physical Anesthesia Plan  ASA: 3  Anesthesia Plan: MAC   Post-op Pain Management: Minimal or no pain anticipated and Tylenol PO (pre-op)*   Induction: Intravenous  PONV Risk Score and Plan: 2 and Ondansetron, Dexamethasone and Treatment may vary due to age or medical condition  Airway Management Planned: Simple Face Mask and Natural Airway  Additional Equipment: None  Intra-op Plan:   Post-operative Plan: Extubation in OR  Informed Consent: I have reviewed the patients History and Physical, chart, labs and discussed the procedure including the risks, benefits and alternatives for the proposed  anesthesia with the patient or authorized representative who has indicated his/her understanding and acceptance.     Dental advisory given  Plan Discussed with: Anesthesiologist, CRNA and Surgeon  Anesthesia Plan  Comments: (See PAT note 05/09/2022)        Anesthesia Quick Evaluation

## 2022-05-09 NOTE — Progress Notes (Signed)
Anesthesia Chart Review   Case: [id number] Date/Time: 05/10/22 1500   Procedures:      EXOSTECTECTOMY TOE (Right)     BONE BIOPSY (Right)   Anesthesia type: Choice   Pre-op diagnosis: ULCER RIGHT FOOT   Location: [facility] ROOM 02 / [facility]   Surgeons: [name], DPM       DISCUSSION:57 y.o. never smoker with h/o HTN, nonobstructive CAD, nonischemic cardiomyopathy with reduced EF suspected from poorly controlled HTN (medications adjusted during admission in December, seen by cardiology and heart failure clinic for follow up), DM II, CKD Stage II, ulcer right foot scheduled for above procedure 05/10/2022 with Daylene Katayama, DPM.   Pt seen by PCP 05/06/22. Per OV note, "2.4% risk of cardiac death based on Revised Goldman's criteria.  He is already holding his aspirin before the procedure.  He will restart at the surgeon's discretion. Form filled out stating he is medically optimized for the proposed procedure."  H&P on chart.   Newly reduced EF discovered during hospital admission in December, felt to be due to poorly controlled HTN. Seen by cardiology 04/09/2022 for hospital follow up, euvolemic at this visit, tolerating medication regimen, BP improved.   Pt last seen by heart failure clinic 05/02/2022. Euvolemic at this visit.   Poorly controlled diabetes, A1C 10.7 during hospitalization.  Per notes he had not been taking insulin consistently.  Evaluate CBG DOS.   Anticipate pt can proceed with planned procedure barring acute status change and after evaluation DOS.  VS: BP 125/73 Comment: right arm sitting  Pulse 77   Temp 36.7 C (Oral)   Resp 14   Ht 5' 10"$  (1.778 m)   Wt 73.9 kg   SpO2 99%   BMI 23.39 kg/m   PROVIDERS: [name], DO is PCP    LABS: Labs reviewed: Acceptable for surgery. (all labs ordered are listed, but only abnormal results are displayed)  Labs Reviewed  CBC - Abnormal; Notable for the following components:      Result Value   RBC 3.83 (*)     Hemoglobin 11.9 (*)    HCT 36.3 (*)    All other components within normal limits  GLUCOSE, CAPILLARY - Abnormal; Notable for the following components:   Glucose-Capillary 136 (*)    All other components within normal limits  HEMOGLOBIN [id number]  BASIC METABOLIC PANEL     IMAGES:   EKG:   CV: Cardiac Cath 03/11/2022 Conclusions: Non-obstructive coronary artery disease, consistent with nonischemic cardiomyopathy.  Most severe lesion is a 60-70% stenosis involving small, non-dominant RCA.  There is a 20-30% proximal LAD stenosis as well as mild luminal irregularities in the ramus intermedius. Upper normal to mildly elevated left heart and pulmonary artery pressures (PCWP 14 mmHg, LVEDP 18 mmHg, mean PA 22 mmHg). Normal right heart filling pressures (RA/RVEDP 6 mmHg). Normal Fick cardiac output/index (CO 4.8 L/min, 2.6 L/min/m^2). Decreased thermodilution cardiac output/index (CO 3.5 L/min, 1.9 L/min/m^2).   Recommendations: Medical therapy and risk factor modification to prevent progression of coronary artery disease. Escalate goal-directed medical therapy for treatment of HFrEF due to NICM, as tolerated.  Echo 03/10/2022 1. Left ventricular ejection fraction, by estimation, is 25 to 30%. The  left ventricle has severely decreased function. The left ventricle  demonstrates global hypokinesis. There is mild concentric left ventricular  hypertrophy. Left ventricular diastolic   parameters are consistent with Grade I diastolic dysfunction (impaired  relaxation).   2. Right ventricular systolic function is normal. The right ventricular  size is normal. Tricuspid regurgitation signal is inadequate for assessing  PA pressure.   3. Left atrial size was moderately dilated.   4. Right atrial size was mildly dilated.   5. The mitral valve is normal in structure. Mild mitral valve  regurgitation. No evidence of mitral stenosis.   6. The aortic valve is tricuspid. Aortic valve regurgitation  is not  visualized.   7. The inferior vena cava is normal in size with greater than 50%  respiratory variability, suggesting right atrial pressure of 3 mmHg.  Past Medical History:  Diagnosis Date   Asthma    CAD in native artery    CHF (congestive heart failure) (HCC)    Chronic HFrEF (heart failure with reduced ejection fraction) (HCC)    CKD (chronic kidney disease) stage 2, GFR 60-89 ml/min    Diabetic foot ulcer (HCC)    History of chicken pox    History of DVT (deep vein thrombosis)    s/p trauma of R leg -- 2014. No other history of DVT   History of kidney stones    Hypertension    Lazy eye of left side    NICM (nonischemic cardiomyopathy) ([place])    Pneumonia    Uncontrolled diabetes mellitus with hyperglycemia [facility])     Past Surgical History:  Procedure Laterality Date   AMPUTATION TOE Left 05/16/2021   Procedure: AMPUTATION LEFT GREAT  TOE;  Surgeon: Trula Slade, DPM;  Location: [place];  Service: Podiatry;  Laterality: Left;   EYE SURGERY Right    cataract surgery  w/ IOL   RIGHT/LEFT HEART CATH AND CORONARY ANGIOGRAPHY N/A 03/11/2022   Procedure: RIGHT/LEFT HEART CATH AND CORONARY ANGIOGRAPHY;  Surgeon: Nelva Bush, MD;  Location: [place] CV LAB;  Service: Cardiovascular;  Laterality: N/A;    MEDICATIONS:  albuterol (VENTOLIN HFA) 108 (90 Base) MCG/ACT inhaler   aspirin 81 MG chewable tablet   atorvastatin (LIPITOR) 80 MG tablet   carvedilol (COREG) 25 MG tablet   dapagliflozin propanediol (FARXIGA) 10 MG TABS tablet   furosemide (LASIX) 20 MG tablet   gentamicin cream (GARAMYCIN) 0.1 %   glucose blood test strip   insulin degludec (TRESIBA FLEXTOUCH) 100 UNIT/ML FlexTouch Pen   Insulin Pen Needle (NOVOFINE PLUS PEN NEEDLE) 32G X 4 MM MISC   isosorbide-hydrALAZINE (BIDIL) 20-37.5 MG tablet   sacubitril-valsartan (ENTRESTO) 97-103 MG   spironolactone (ALDACTONE) 25 MG tablet   No current facility-administered medications for this encounter.     Konrad Felix Ward, PA-C [facility] [phone]

## 2022-05-10 ENCOUNTER — Ambulatory Visit (HOSPITAL_BASED_OUTPATIENT_CLINIC_OR_DEPARTMENT_OTHER): Payer: Commercial Managed Care - PPO | Admitting: Certified Registered Nurse Anesthetist

## 2022-05-10 ENCOUNTER — Encounter (HOSPITAL_COMMUNITY): Payer: Self-pay | Admitting: Podiatry

## 2022-05-10 ENCOUNTER — Encounter (HOSPITAL_COMMUNITY)
Admission: RE | Disposition: A | Discharge status: Home / Self Care | Payer: Self-pay | Source: Home / Self Care | Attending: Podiatry

## 2022-05-10 ENCOUNTER — Ambulatory Visit (HOSPITAL_COMMUNITY): Payer: Commercial Managed Care - PPO | Admitting: Physician Assistant

## 2022-05-10 ENCOUNTER — Ambulatory Visit (HOSPITAL_COMMUNITY)
Admission: RE | Admit: 2022-05-10 | Discharge: 2022-05-10 | Disposition: A | Discharge status: Home / Self Care | Payer: Commercial Managed Care - PPO | Attending: Podiatry | Admitting: Podiatry

## 2022-05-10 ENCOUNTER — Other Ambulatory Visit: Payer: Self-pay

## 2022-05-10 ENCOUNTER — Other Ambulatory Visit (HOSPITAL_COMMUNITY): Payer: Self-pay

## 2022-05-10 DIAGNOSIS — N182 Chronic kidney disease, stage 2 (mild): Secondary | ICD-10-CM | POA: Insufficient documentation

## 2022-05-10 DIAGNOSIS — I5022 Chronic systolic (congestive) heart failure: Secondary | ICD-10-CM | POA: Insufficient documentation

## 2022-05-10 DIAGNOSIS — Z7984 Long term (current) use of oral hypoglycemic drugs: Secondary | ICD-10-CM | POA: Insufficient documentation

## 2022-05-10 DIAGNOSIS — E11621 Type 2 diabetes mellitus with foot ulcer: Secondary | ICD-10-CM | POA: Insufficient documentation

## 2022-05-10 DIAGNOSIS — L97512 Non-pressure chronic ulcer of other part of right foot with fat layer exposed: Secondary | ICD-10-CM

## 2022-05-10 DIAGNOSIS — J45909 Unspecified asthma, uncomplicated: Secondary | ICD-10-CM | POA: Diagnosis not present

## 2022-05-10 DIAGNOSIS — Z89412 Acquired absence of left great toe: Secondary | ICD-10-CM | POA: Diagnosis not present

## 2022-05-10 DIAGNOSIS — I10 Essential (primary) hypertension: Secondary | ICD-10-CM

## 2022-05-10 DIAGNOSIS — I130 Hypertensive heart and chronic kidney disease with heart failure and stage 1 through stage 4 chronic kidney disease, or unspecified chronic kidney disease: Secondary | ICD-10-CM | POA: Insufficient documentation

## 2022-05-10 DIAGNOSIS — Z794 Long term (current) use of insulin: Secondary | ICD-10-CM | POA: Insufficient documentation

## 2022-05-10 DIAGNOSIS — I2510 Atherosclerotic heart disease of native coronary artery without angina pectoris: Secondary | ICD-10-CM | POA: Diagnosis not present

## 2022-05-10 DIAGNOSIS — K219 Gastro-esophageal reflux disease without esophagitis: Secondary | ICD-10-CM | POA: Diagnosis not present

## 2022-05-10 DIAGNOSIS — Z09 Encounter for follow-up examination after completed treatment for conditions other than malignant neoplasm: Secondary | ICD-10-CM | POA: Diagnosis not present

## 2022-05-10 DIAGNOSIS — E119 Type 2 diabetes mellitus without complications: Secondary | ICD-10-CM

## 2022-05-10 DIAGNOSIS — L97519 Non-pressure chronic ulcer of other part of right foot with unspecified severity: Secondary | ICD-10-CM | POA: Insufficient documentation

## 2022-05-10 DIAGNOSIS — E1122 Type 2 diabetes mellitus with diabetic chronic kidney disease: Secondary | ICD-10-CM | POA: Diagnosis not present

## 2022-05-10 HISTORY — PX: EXOSTECTECTOMY TOE: SHX6618

## 2022-05-10 HISTORY — PX: BONE BIOPSY: SHX375

## 2022-05-10 LAB — GLUCOSE, CAPILLARY
Glucose-Capillary: 94 mg/dL (ref 70–99)
Glucose-Capillary: 102 mg/dL — ABNORMAL HIGH (ref 70–99)

## 2022-05-10 SURGERY — EXCISION, EXOSTOSIS, TOE
Anesthesia: Monitor Anesthesia Care | Laterality: Right

## 2022-05-10 MED ORDER — 0.9 % SODIUM CHLORIDE (POUR BTL) OPTIME
TOPICAL | Status: DC | PRN
  Administered 2022-05-10: 1000 mL

## 2022-05-10 MED ORDER — PROPOFOL 1000 MG/100ML IV EMUL
INTRAVENOUS | Status: AC
  Filled 2022-05-10: qty 100

## 2022-05-10 MED ORDER — LACTATED RINGERS IV SOLN: INTRAVENOUS | Status: DC

## 2022-05-10 MED ORDER — CEFAZOLIN SODIUM-DEXTROSE 2-4 GM/100ML-% IV SOLN
2.0000 g | Freq: Once | INTRAVENOUS | Status: AC
  Administered 2022-05-10: 2 g via INTRAVENOUS

## 2022-05-10 MED ORDER — MIDAZOLAM HCL 2 MG/2ML IJ SOLN
INTRAMUSCULAR | Status: DC | PRN
  Administered 2022-05-10: 2 mg via INTRAVENOUS

## 2022-05-10 MED ORDER — PROPOFOL 10 MG/ML IV BOLUS
INTRAVENOUS | Status: DC | PRN
  Administered 2022-05-10 (×3): 20 mg via INTRAVENOUS

## 2022-05-10 MED ORDER — CEFAZOLIN SODIUM-DEXTROSE 2-4 GM/100ML-% IV SOLN
INTRAVENOUS | Status: AC
  Filled 2022-05-10: qty 100

## 2022-05-10 MED ORDER — PROPOFOL 500 MG/50ML IV EMUL
INTRAVENOUS | Status: DC | PRN
  Administered 2022-05-10: 50 ug/kg/min via INTRAVENOUS

## 2022-05-10 MED ORDER — ORAL CARE MOUTH RINSE: 15.0000 mL | Freq: Once | OROMUCOSAL | Status: AC

## 2022-05-10 MED ORDER — FENTANYL CITRATE PF 50 MCG/ML IJ SOSY: 50.0000 ug | PREFILLED_SYRINGE | INTRAMUSCULAR | Status: DC

## 2022-05-10 MED ORDER — LIDOCAINE HCL (PF) 1 % IJ SOLN
INTRAMUSCULAR | Status: AC
  Filled 2022-05-10: qty 30

## 2022-05-10 MED ORDER — LIDOCAINE HCL (CARDIAC) PF 100 MG/5ML IV SOSY
PREFILLED_SYRINGE | INTRAVENOUS | Status: DC | PRN
  Administered 2022-05-10: 100 mg via INTRATRACHEAL

## 2022-05-10 MED ORDER — LIDOCAINE HCL 1 % IJ SOLN
INTRAMUSCULAR | Status: DC | PRN
  Administered 2022-05-10: 8 mL via INTRAMUSCULAR

## 2022-05-10 MED ORDER — SODIUM CHLORIDE 0.9 % IV SOLN: INTRAVENOUS | Status: DC | PRN

## 2022-05-10 MED ORDER — BUPIVACAINE HCL (PF) 0.5 % IJ SOLN
INTRAMUSCULAR | Status: AC
  Filled 2022-05-10: qty 30

## 2022-05-10 MED ORDER — MIDAZOLAM HCL 2 MG/2ML IJ SOLN: 1.0000 mg | INTRAMUSCULAR | Status: DC

## 2022-05-10 MED ORDER — CHLORHEXIDINE GLUCONATE 0.12 % MT SOLN
15.0000 mL | Freq: Once | OROMUCOSAL | Status: AC
  Administered 2022-05-10: 15 mL via OROMUCOSAL

## 2022-05-10 MED ORDER — MIDAZOLAM HCL 2 MG/2ML IJ SOLN
INTRAMUSCULAR | Status: AC
  Filled 2022-05-10: qty 2

## 2022-05-10 SURGICAL SUPPLY — 39 items
BENZOIN TINCTURE PRP APPL 2/3 (GAUZE/BANDAGES/DRESSINGS) ×1 IMPLANT
BLADE PRESCISION 7.0X.51X18.5 (BLADE) IMPLANT
BLADE SURG 15 STRL LF DISP TIS (BLADE) ×1 IMPLANT
BLADE SURG 15 STRL SS (BLADE) ×1
BNDG CONFORM 6X.82 1P STRL (GAUZE/BANDAGES/DRESSINGS) ×1 IMPLANT
BNDG ELASTIC 3X5.8 VLCR STR LF (GAUZE/BANDAGES/DRESSINGS) IMPLANT
BNDG ELASTIC 4X5.8 VLCR STR LF (GAUZE/BANDAGES/DRESSINGS) ×1 IMPLANT
BNDG ESMARK 4X9 LF (GAUZE/BANDAGES/DRESSINGS) ×1 IMPLANT
CHLORAPREP W/TINT 26 (MISCELLANEOUS) ×1 IMPLANT
CLOTH BEACON ORANGE TIMEOUT ST (SAFETY) ×1 IMPLANT
COVER SURGICAL LIGHT HANDLE (MISCELLANEOUS) ×2 IMPLANT
CUFF TOURN SGL QUICK 18X4 (TOURNIQUET CUFF) ×1 IMPLANT
DRAPE OEC MINIVIEW 54X84 (DRAPES) ×1 IMPLANT
DRSG ADAPTIC 3X8 NADH LF (GAUZE/BANDAGES/DRESSINGS) ×1 IMPLANT
DRSG EMULSION OIL 3X3 NADH (GAUZE/BANDAGES/DRESSINGS) IMPLANT
ELECT REM PT RETURN 15FT ADLT (MISCELLANEOUS) ×1 IMPLANT
GAUZE 4X4 16PLY ~~LOC~~+RFID DBL (SPONGE) ×1 IMPLANT
GAUZE SPONGE 4X4 12PLY STRL (GAUZE/BANDAGES/DRESSINGS) ×1 IMPLANT
GLOVE BIO SURGEON STRL SZ8 (GLOVE) ×1 IMPLANT
GLOVE BIOGEL PI IND STRL 8 (GLOVE) ×1 IMPLANT
GOWN STRL REUS W/ TWL LRG LVL3 (GOWN DISPOSABLE) ×1 IMPLANT
GOWN STRL REUS W/TWL LRG LVL3 (GOWN DISPOSABLE) ×1
KIT BASIN OR (CUSTOM PROCEDURE TRAY) ×1 IMPLANT
MANIFOLD NEPTUNE II (INSTRUMENTS) ×1 IMPLANT
NDL BIOPSY JAMSHIDI 11X6 (NEEDLE) IMPLANT
NDL HYPO 25X1 1.5 SAFETY (NEEDLE) ×3 IMPLANT
NEEDLE BIOPSY JAMSHIDI 11X6 (NEEDLE) ×1 IMPLANT
NEEDLE HYPO 25X1 1.5 SAFETY (NEEDLE) ×3 IMPLANT
NS IRRIG 1000ML POUR BTL (IV SOLUTION) ×1 IMPLANT
PACK ORTHO EXTREMITY (CUSTOM PROCEDURE TRAY) ×1 IMPLANT
PAD ARMBOARD 7.5X6 YLW CONV (MISCELLANEOUS) ×1 IMPLANT
SPIKE FLUID TRANSFER (MISCELLANEOUS) ×2 IMPLANT
STRIP CLOSURE SKIN 1/2X4 (GAUZE/BANDAGES/DRESSINGS) ×1 IMPLANT
SUT PROLENE 4 0 PS 2 18 (SUTURE) IMPLANT
SUT VIC AB 2-0 CT2 27 (SUTURE) ×1 IMPLANT
SUT VIC AB 3-0 SH 27 (SUTURE) ×1
SUT VIC AB 3-0 SH 27X BRD (SUTURE) ×1 IMPLANT
SUT VICRYL AB 3-0 FS1 BRD 27IN (SUTURE) ×1 IMPLANT
SYR CONTROL 10ML LL (SYRINGE) ×2 IMPLANT

## 2022-05-10 NOTE — H&P (View-Only) (Signed)
Chief Complaint  Patient presents with   Diabetic Ulcer     MRI results. Diabetic ulcer  right foot started draining last night. No f/c/n/v. No pain at moment.     Subjective:  57 y.o. male with PMHx of diabetes mellitus, PSxHx left great toe amputation 05/16/2021 presenting today for follow-up evaluation of an ulcer to the plantar aspect of the right great toe.  Patient continues to deal with the wound on a daily basis.  He says there are times where it was improved but then it deteriorates.  Last visit MRI was ordered.  He presents today to review the MRI results and discuss further treatment options  Past Medical History:  Diagnosis Date   Asthma    CAD in native artery    CHF (congestive heart failure) (HCC)    Chronic HFrEF (heart failure with reduced ejection fraction) (HCC)    CKD (chronic kidney disease) stage 2, GFR 60-89 ml/min    Diabetic foot ulcer ([place])    History of chicken pox    History of DVT (deep vein thrombosis)    s/p trauma of R leg -- 2014. No other history of DVT   History of kidney stones    Hypertension    Lazy eye of left side    NICM (nonischemic cardiomyopathy) ([place])    Pneumonia    Uncontrolled diabetes mellitus with hyperglycemia [facility])     Past Surgical History:  Procedure Laterality Date   AMPUTATION TOE Left 05/16/2021   Procedure: AMPUTATION LEFT GREAT  TOE;  Surgeon: Trula Slade, DPM;  Location: [place];  Service: Podiatry;  Laterality: Left;   EYE SURGERY Right    cataract surgery  w/ IOL   RIGHT/LEFT HEART CATH AND CORONARY ANGIOGRAPHY N/A 03/11/2022   Procedure: RIGHT/LEFT HEART CATH AND CORONARY ANGIOGRAPHY;  Surgeon: Nelva Bush, MD;  Location: [place] [facility];  Service: Cardiovascular;  Laterality: N/A;    Allergies  Allergen Reactions   Bidil [Isosorb Dinitrate-Hydralazine] Other (See Comments)    Dropped BP   Zofran [Ondansetron Hcl] Nausea Only    Abdominal pain     RT foot 02/04/2022     RT foot  04/01/2022  Objective/Physical Exam General: The patient is alert and oriented x3 in no acute distress.  Dermatology:  Unchanged.  Ulcer measures approximately 2.0 x 1.2 x 0.3 cm.  Granular wound base.  Periwound intact and macerated.  No exposed bone muscle tendon ligament joint.  The ulcer also appears to extend around the medial aspect of the toe.  Please see above noted photo.  Vascular: Palpable pedal pulses bilaterally. No edema or erythema noted. Capillary refill within normal limits.  Clinically there is no indication or concern for vascular compromise  Neurological: Epicritic and protective threshold diminished bilaterally.   Musculoskeletal Exam: History of prior amputation left great toe 05/16/2021  Radiographic exam RT foot 04/01/2022: Normal osseous mineralization.  No obvious sign of osseous irregularity or erosions around the great toe.  There does appear to be an accessory IPJ bone visualized on AP and medial oblique views.    MR TOES RIGHT WO CONTRAST 04/28/2022 IMPRESSION: 1. Apparent soft tissue ulceration along the plantar aspect of the great toe at the level of the interphalangeal joint with surrounding soft tissue edema, but no focal fluid collection. 2. T2 hyperintensity throughout the distal 1st phalanx with mild associated decreased T1 marrow signal, suspicious for early osteomyelitis. No cortical destruction identified. 3. Probable rupture of the flexor hallucis longus  tendon at the level of the 1st metatarsal.  Assessment: 1.  Ulcer right great toe secondary to diabetes mellitus 2. diabetes mellitus w/ peripheral neuropathy 3. PSxHx amputation LT great toe 05/16/2021 4.  Pain due to onychomycosis of toenails both  Plan of Care:  1. Patient was evaluated.  2. medically necessary excisional debridement including subcutaneous tissue was performed using a tissue nipper and a chisel blade. Excisional debridement of all the necrotic nonviable tissue down to healthy  bleeding viable tissue was performed with post-debridement measurements same as pre-. 3. Continue diabetic shoes with insoles 4.  Unfortunately patient continues to have recurrence of the ulcer to the plantar aspect of the right great toe.  MRI findings do identify a interphalangeal sesamoid which is contributing to the wound and increasing plantigrade pressure to the toe.  I do believe that the best solution to help heal the ulcer and prevent amputation of the toe would be to remove the interphalangeal sesamoid.  Risk benefits advantages and disadvantages were explained in detail to the patient.  Postoperative recovery course was also explained.  No guarantees were expressed or implied.  Patient agrees and would like to consent and proceed with surgery at this time 5.  Surgery will consist of excision of interphalangeal sesamoid right great toe.  The sesamoid will be sent to pathology for bone culture.  Debridement of ulcer right great toe 6.  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 [name], DPM    [address], [place] [address]                Office [phone]  Fax [phone]

## 2022-05-10 NOTE — Transfer of Care (Signed)
Immediate Anesthesia Transfer of Care Note  Patient: John [name]  Procedure(s) Performed: EXOSTECTECTOMY TOE (Right) BONE BIOPSY (Right)  Patient Location: PACU  Anesthesia Type:MAC  Level of Consciousness: awake and alert   Airway & Oxygen Therapy: Patient Spontanous Breathing and Patient connected to face mask oxygen  Post-op Assessment: Report given to RN and Post -op Vital signs reviewed and stable  Post vital signs: Reviewed and stable  Last Vitals:  Vitals Value Taken Time  BP 144/91 05/10/22 1522  Temp    Pulse 70 05/10/22 1522  Resp 14 05/10/22 1522  SpO2 100 % 05/10/22 1522  Vitals shown include unvalidated device data.  Last Pain:  Vitals:   05/10/22 1231  TempSrc: Oral  PainSc: 0-No pain         Complications: No notable events documented.

## 2022-05-10 NOTE — Brief Op Note (Signed)
05/10/2022  3:16 PM  PATIENT:  John [name]  57 y.o. male  PRE-OPERATIVE DIAGNOSIS:  ULCER RIGHT FOOT  POST-OPERATIVE DIAGNOSIS:  ULCER RIGHT FOOT  PROCEDURE:  Procedure(s): EXOSTECTECTOMY TOE (Right) BONE BIOPSY (Right)  SURGEON:  Surgeon(s) and Role:    [name], DPM - Primary  PHYSICIAN ASSISTANT:   ASSISTANTS: none   ANESTHESIA:   local and MAC  EBL:  minimal   BLOOD ADMINISTERED:none  DRAINS: none   LOCAL MEDICATIONS USED:  MARCAINE   , LIDOCAINE , and Amount: 8 ml  SPECIMEN:  Source of Specimen:  Interphalangeal sesmoid right great toe and distal phalanx right great toe sent to pathology for CULTURE  DISPOSITION OF SPECIMEN:  PATHOLOGY  COUNTS:  YES  TOURNIQUET:  CALF  DICTATION: .Dragon Dictation  PLAN OF CARE: Discharge to home after PACU  PATIENT DISPOSITION:  PACU - hemodynamically stable.   Delay start of Pharmacological VTE agent (>24hrs) due to surgical blood loss or risk of bleeding: not applicable  [name], DPM [facility]  Dr. [name], DPM    [address], [place] [address]                Office [phone]  Fax [phone]

## 2022-05-10 NOTE — Anesthesia Procedure Notes (Signed)
Date/Time: 05/10/2022 2:37 PM  Performed by: Sharlette Dense, CRNAOxygen Delivery Method: Simple face mask

## 2022-05-10 NOTE — Progress Notes (Signed)
Chief Complaint  Patient presents with   Diabetic Ulcer     MRI results. Diabetic ulcer  right foot started draining last night. No f/c/n/v. No pain at moment.     Subjective:  57 y.o. male with PMHx of diabetes mellitus, PSxHx left great toe amputation 05/16/2021 presenting today for follow-up evaluation of an ulcer to the plantar aspect of the right great toe.  Patient continues to deal with the wound on a daily basis.  He says there are times where it was improved but then it deteriorates.  Last visit MRI was ordered.  He presents today to review the MRI results and discuss further treatment options  Past Medical History:  Diagnosis Date   Asthma    CAD in native artery    CHF (congestive heart failure) (HCC)    Chronic HFrEF (heart failure with reduced ejection fraction) (HCC)    CKD (chronic kidney disease) stage 2, GFR 60-89 ml/min    Diabetic foot ulcer ([place])    History of chicken pox    History of DVT (deep vein thrombosis)    s/p trauma of R leg -- 2014. No other history of DVT   History of kidney stones    Hypertension    Lazy eye of left side    NICM (nonischemic cardiomyopathy) ([place])    Pneumonia    Uncontrolled diabetes mellitus with hyperglycemia [facility])     Past Surgical History:  Procedure Laterality Date   AMPUTATION TOE Left 05/16/2021   Procedure: AMPUTATION LEFT GREAT  TOE;  Surgeon: Trula Slade, DPM;  Location: [place];  Service: Podiatry;  Laterality: Left;   EYE SURGERY Right    cataract surgery  w/ IOL   RIGHT/LEFT HEART CATH AND CORONARY ANGIOGRAPHY N/A 03/11/2022   Procedure: RIGHT/LEFT HEART CATH AND CORONARY ANGIOGRAPHY;  Surgeon: Nelva Bush, MD;  Location: [place] [facility];  Service: Cardiovascular;  Laterality: N/A;    Allergies  Allergen Reactions   Bidil [Isosorb Dinitrate-Hydralazine] Other (See Comments)    Dropped BP   Zofran [Ondansetron Hcl] Nausea Only    Abdominal pain     RT foot 02/04/2022     RT foot  04/01/2022  Objective/Physical Exam General: The patient is alert and oriented x3 in no acute distress.  Dermatology:  Unchanged.  Ulcer measures approximately 2.0 x 1.2 x 0.3 cm.  Granular wound base.  Periwound intact and macerated.  No exposed bone muscle tendon ligament joint.  The ulcer also appears to extend around the medial aspect of the toe.  Please see above noted photo.  Vascular: Palpable pedal pulses bilaterally. No edema or erythema noted. Capillary refill within normal limits.  Clinically there is no indication or concern for vascular compromise  Neurological: Epicritic and protective threshold diminished bilaterally.   Musculoskeletal Exam: History of prior amputation left great toe 05/16/2021  Radiographic exam RT foot 04/01/2022: Normal osseous mineralization.  No obvious sign of osseous irregularity or erosions around the great toe.  There does appear to be an accessory IPJ bone visualized on AP and medial oblique views.    MR TOES RIGHT WO CONTRAST 04/28/2022 IMPRESSION: 1. Apparent soft tissue ulceration along the plantar aspect of the great toe at the level of the interphalangeal joint with surrounding soft tissue edema, but no focal fluid collection. 2. T2 hyperintensity throughout the distal 1st phalanx with mild associated decreased T1 marrow signal, suspicious for early osteomyelitis. No cortical destruction identified. 3. Probable rupture of the flexor hallucis longus  tendon at the level of the 1st metatarsal.  Assessment: 1.  Ulcer right great toe secondary to diabetes mellitus 2. diabetes mellitus w/ peripheral neuropathy 3. PSxHx amputation LT great toe 05/16/2021 4.  Pain due to onychomycosis of toenails both  Plan of Care:  1. Patient was evaluated.  2. medically necessary excisional debridement including subcutaneous tissue was performed using a tissue nipper and a chisel blade. Excisional debridement of all the necrotic nonviable tissue down to healthy  bleeding viable tissue was performed with post-debridement measurements same as pre-. 3. Continue diabetic shoes with insoles 4.  Unfortunately patient continues to have recurrence of the ulcer to the plantar aspect of the right great toe.  MRI findings do identify a interphalangeal sesamoid which is contributing to the wound and increasing plantigrade pressure to the toe.  I do believe that the best solution to help heal the ulcer and prevent amputation of the toe would be to remove the interphalangeal sesamoid.  Risk benefits advantages and disadvantages were explained in detail to the patient.  Postoperative recovery course was also explained.  No guarantees were expressed or implied.  Patient agrees and would like to consent and proceed with surgery at this time 5.  Surgery will consist of excision of interphalangeal sesamoid right great toe.  The sesamoid will be sent to pathology for bone culture.  Debridement of ulcer right great toe 6.  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 [name], DPM    [address], [place] [address]                Office [phone]  Fax [phone]

## 2022-05-10 NOTE — Interval H&P Note (Signed)
History and Physical Interval Note:  05/10/2022 2:11 PM  John [name]  has presented today for surgery, with the diagnosis of ULCER RIGHT FOOT.  The various methods of treatment have been discussed with the patient and family. After consideration of risks, benefits and other options for treatment, the patient has consented to  Procedure(s): EXOSTECTECTOMY TOE (Right) BONE BIOPSY (Right) as a surgical intervention.  The patient's history has been reviewed, patient examined, no change in status, stable for surgery.  I have reviewed the patient's chart and labs.  Questions were answered to the patient's satisfaction.     [name]

## 2022-05-12 ENCOUNTER — Encounter (HOSPITAL_COMMUNITY): Payer: Self-pay | Admitting: Podiatry

## 2022-05-12 NOTE — Anesthesia Postprocedure Evaluation (Signed)
Anesthesia Post Note  Patient: John [name]  Procedure(s) Performed: EXOSTECTECTOMY TOE (Right) BONE BIOPSY (Right)     Patient location during evaluation: PACU Anesthesia Type: MAC Level of consciousness: awake and alert Pain management: pain level controlled Vital Signs Assessment: post-procedure vital signs reviewed and stable Respiratory status: spontaneous breathing, nonlabored ventilation and respiratory function stable Cardiovascular status: blood pressure returned to baseline Postop Assessment: no apparent nausea or vomiting Anesthetic complications: no   No notable events documented.  Last Vitals:  Vitals:   05/10/22 1552 05/10/22 1607  BP: (!) 149/92 (!) 159/94  Pulse: 63 (!) 57  Resp:    Temp:  36.6 C  SpO2: 100% 100%    Last Pain:  Vitals:   05/10/22 1607  TempSrc: Oral  PainSc:                  Marthenia Rolling

## 2022-05-13 NOTE — Op Note (Signed)
OPERATIVE REPORT Patient name: John [name] MRN: [id number] DOB: December 30, 1965  DOS: 05/13/22  Preop Dx: Ulcer right great toe.  Interphalangeal sesamoid right great toe.  Concern for osteomyelitis right great toe Postop Dx: same  Procedure:  1.  Excision of the interphalangeal sesamoid right great toe 2.  Bone biopsy distal phalanx right great toe 3.  Excisional debridement of ulcer right great toe  Surgeon: [name] DPM  Anesthesia: 50-50 mixture of 2% lidocaine plain with 0.5% Marcaine plain totaling 8 mL infiltrated in the patient's right great toe digital block fashion  Hemostasis: Calf tourniquet inflated to a pressure of 283mHg after esmarch exsanguination   EBL: Minimal mL Materials: None Injectables: None Pathology: Interphalangeal sesamoid AND base of the distal phalanx sent in 2 separate specimens for bone culture  Condition: The patient tolerated the procedure and anesthesia well. No complications noted or reported   Justification for procedure: The patient is a 57y.o. male PMHx diabetes mellitus who presents today for surgical correction of chronic nonhealing ulcer to the plantar aspect of the right great toe with concern for underlying osteomyelitis. All conservative modalities of been unsuccessful in providing any sort of satisfactory alleviation of symptoms with the patient. The patient was told benefits as well as possible side effects of the surgery. The patient consented for surgical correction. The patient consent form was reviewed. All patient questions were answered. No guarantees were expressed or implied. The patient and the surgeon both signed the patient consent form with the witness present and placed in the patient's chart.   Procedure in Detail: The patient was brought to the operating room, placed in the operating table in the supine position at which time an aseptic scrub and drape were performed about the patient's respective lower extremity after  anesthesia was induced as described above. Attention was then directed to the surgical area where procedure number one commenced.  Procedure #1: Excision of interphalangeal sesmoid right A 2 cm linear longitudinal skin incision was planned and made about the plantar medial aspect of the right great toe.  Incision was carried down to the level of bone and FHL tendon with careful dissection to ensure that no neurovascular structures or tendons were cut.  The FHL tendon specifically was identified within the incision and retracted and preserved.  Visualization of the plantar interphalangeal sesamoid was achieved and careful dissection of the surrounding soft tissue was performed using a #15 scalpel.  The sesamoid was removed in toto and placed in sterile specimen container and sent to pathology for culture.  Procedure #2: Exostectomy base of the distal phalanx right great toe Additional soft tissue dissection was made around the plantar medial base of the distal phalanx which was concerning for osteomyelitis based on MRI.  A sagittal blade mounted sagittal saw was utilized to resect a small portion of the plantar medial base of the distal phalanx of the right great toe which was also placed in a separate specimen container and sent to pathology for culture.  Prior to harvesting of the portion of bone and exostectomy, copious irrigation was utilized after which the portion of bone was harvested and additional irrigation was utilized again and the area was dried in preparation for primary closure.  4-0 Prolene suture was utilized to reapproximate superficial skin edges.  Procedure #3: Excisional debridement of ulcer right great toe Attention was then directed to the plantar aspect of the right great toe ulcer where a medically necessary excisional debridement including muscle and deep  fascial tissue was performed using a #15 scalpel.  Excisional debridement of the necrotic nonviable tissue down to healthier  bleeding viable tissue was performed with postdebridement measurement same as pre-.  The wound measures approximately 1.0 x 1.0 x 0.3 cm.  Granular wound base.  There is no exposed bone muscle tendon ligament or joint along the plantar wound.  Dry sterile compressive dressings were then applied to all previously mentioned incision sites about the patient's lower extremity. The tourniquet which was used for hemostasis was deflated. All normal neurovascular responses including pink color and warmth returned all the digits of patient's lower extremity.  The patient was then transferred from the operating room to the recovery room having tolerated the procedure and anesthesia well. All vital signs are stable. After a brief stay in the recovery room the patient was readmitted to inpatient room with postoperative orders placed.    IMPRESSION: Overall the surgical area appeared clean.  Good potential for healing.  Visibly/intraoperatively the osseous structures of the toe appeared healthy and viable despite concern for osteo on MRI.  [name], DPM [facility]  Dr. [name], DPM    [address], [place] [address]                Office [phone]  Fax [phone]

## 2022-05-14 ENCOUNTER — Encounter: Payer: Self-pay | Admitting: Family Medicine

## 2022-05-15 LAB — AEROBIC/ANAEROBIC CULTURE W GRAM STAIN (SURGICAL/DEEP WOUND)
Culture: NO GROWTH
Gram Stain: NONE SEEN

## 2022-05-16 ENCOUNTER — Ambulatory Visit (HOSPITAL_COMMUNITY)
Admission: RE | Admit: 2022-05-16 | Discharge: 2022-05-16 | Disposition: A | Discharge status: Home / Self Care | Payer: Commercial Managed Care - PPO | Source: Ambulatory Visit | Attending: Cardiology | Admitting: Cardiology

## 2022-05-16 ENCOUNTER — Telehealth: Payer: Self-pay | Admitting: Family Medicine

## 2022-05-16 DIAGNOSIS — I1A0 Resistant hypertension: Secondary | ICD-10-CM | POA: Insufficient documentation

## 2022-05-16 NOTE — Telephone Encounter (Signed)
Pt's spouse dropped off document to be filled out by provider (FMLA in yellow folder) Spouse stated would like document to be faxed when ready to Fax [phone] and to also have a copy for her to pick up when ready at [phone] [facility]. Document put at front office tray under providers name.

## 2022-05-17 ENCOUNTER — Ambulatory Visit (INDEPENDENT_AMBULATORY_CARE_PROVIDER_SITE_OTHER): Payer: Commercial Managed Care - PPO

## 2022-05-17 ENCOUNTER — Ambulatory Visit (INDEPENDENT_AMBULATORY_CARE_PROVIDER_SITE_OTHER): Payer: Commercial Managed Care - PPO | Admitting: Podiatry

## 2022-05-17 ENCOUNTER — Other Ambulatory Visit (HOSPITAL_COMMUNITY): Payer: Self-pay

## 2022-05-17 VITALS — BP 154/89 | HR 80

## 2022-05-17 DIAGNOSIS — L97512 Non-pressure chronic ulcer of other part of right foot with fat layer exposed: Secondary | ICD-10-CM

## 2022-05-17 MED ORDER — CLINDAMYCIN HCL 300 MG PO CAPS
300.0000 mg | ORAL_CAPSULE | Freq: Three times a day (TID) | ORAL | 0 refills | Status: DC
  Filled 2022-05-17 – 2022-06-25 (×3): qty 30, 10d supply, fill #0

## 2022-05-17 NOTE — Telephone Encounter (Signed)
Called to schedule appt with PCP, but no answer/no VM

## 2022-05-17 NOTE — Progress Notes (Signed)
   Chief Complaint  Patient presents with   Routine Post Op    POV #1 DOS 05/10/2022 EXCISION OF ACCESSORY OSSICLE RIGHT GREAT TOE, BONE BIOPSY RIGHT GREAT TOE    Subjective:  Patient presents today status post excision of with IPJ sesamoid with debridement of ulcer and bone biopsy right great toe.  DOS: 05/10/2022.  Patient doing well.  Dressings clean dry and intact.  WBAT in the surgical shoe.  No new complaints  Past Medical History:  Diagnosis Date   Asthma    CAD in native artery    CHF (congestive heart failure) (HCC)    Chronic HFrEF (heart failure with reduced ejection fraction) (HCC)    CKD (chronic kidney disease) stage 2, GFR 60-89 ml/min    Diabetic foot ulcer ([place])    History of chicken pox    History of DVT (deep vein thrombosis)    s/p trauma of R leg -- 2014. No other history of DVT   History of kidney stones    Hypertension    Lazy eye of left side    NICM (nonischemic cardiomyopathy) ([place])    Pneumonia    Uncontrolled diabetes mellitus with hyperglycemia [facility])     Past Surgical History:  Procedure Laterality Date   AMPUTATION TOE Left 05/16/2021   Procedure: AMPUTATION LEFT GREAT  TOE;  Surgeon: Trula Slade, DPM;  Location: [facility];  Service: Podiatry;  Laterality: Left;   BONE BIOPSY Right 05/10/2022   Procedure: BONE BIOPSY;  Surgeon: [name], DPM;  [facility];  Service: Podiatry;  Laterality: Right;   EXOSTECTECTOMY TOE Right 05/10/2022   Procedure: EXOSTECTECTOMY TOE;  Surgeon: [name], DPM;  [facility];  Service: Podiatry;  Laterality: Right;   EYE SURGERY Right    cataract surgery  w/ IOL   RIGHT/LEFT HEART CATH AND CORONARY ANGIOGRAPHY N/A 03/11/2022   Procedure: RIGHT/LEFT HEART CATH AND CORONARY ANGIOGRAPHY;  Surgeon: Nelva Bush, MD;  Location: [place] [facility];  Service: Cardiovascular;  Laterality: N/A;    Allergies  Allergen Reactions   Bidil [Isosorb Dinitrate-Hydralazine] Other (See Comments)    Dropped  BP   Zofran [Ondansetron Hcl] Nausea Only    Abdominal pain    Objective/Physical Exam Clinically the surgical toe appears to be healing nicely.  Neurovascular status intact.  Incision well coapted with sutures intact. No sign of infectious process noted. No dehiscence. No active bleeding noted.  The ulcer to the plantar aspect of the toe appears to be healing nicely with significant improvement since prior to surgery  Radiographic Exam RT foot 05/17/2022:  Stable.  Normal osseous mineralization.  No erosions to be concerning for osteomyelitis.  Assessment: 1. s/p IPJ sesamoidectomy with debridement of ulcer and bone biopsy right great toe. DOS: 05/10/2022 -Patient evaluated.  Dressings changed -Continue WBAT postop shoe - Prescription for clindamycin 300 mg TID x 10 days based on IntraOp cultures  -Return to clinic 1 week   [name], DPM [facility]  Dr. [name], DPM    [address], [place] [address]                Office [phone]  Fax [phone]

## 2022-05-17 NOTE — Telephone Encounter (Signed)
Called and scheduled appointment on 05/21/22 at 8:45 AM Paperwork is on my desk in folder.

## 2022-05-20 ENCOUNTER — Ambulatory Visit: Payer: Commercial Managed Care - PPO | Admitting: Podiatry

## 2022-05-21 ENCOUNTER — Ambulatory Visit: Payer: Commercial Managed Care - PPO | Admitting: Family Medicine

## 2022-05-21 ENCOUNTER — Encounter: Payer: Self-pay | Admitting: Family Medicine

## 2022-05-21 VITALS — BP 108/70 | HR 88 | Temp 98.4°F | Ht 70.0 in | Wt 168.1 lb

## 2022-05-21 DIAGNOSIS — E0843 Diabetes mellitus due to underlying condition with diabetic autonomic (poly)neuropathy: Secondary | ICD-10-CM

## 2022-05-21 DIAGNOSIS — I509 Heart failure, unspecified: Secondary | ICD-10-CM

## 2022-05-21 NOTE — Progress Notes (Signed)
Chief Complaint  Patient presents with   complete paperwork    Subjective: Patient is a 57 y.o. male here for completion of a form.  He is here with his spouse.  The patient has been dealing with several health related issues.  He has uncontrolled diabetes leading to diabetic foot ulcer on the right side which limits his ability to walk/drive.  His spouse takes him to his specialty appointments.  He follows with podiatry for the wound on his foot as well.  He recently had a surgery.  He follows with cardiology for CHF with reduced ejection fraction, CAD, and cardiomyopathy.  His wife is requesting FMLA forms to be completed so she may bring him to these appointments.  Past Medical History:  Diagnosis Date   Asthma    CAD in native artery    CHF (congestive heart failure) (HCC)    Chronic HFrEF (heart failure with reduced ejection fraction) (HCC)    CKD (chronic kidney disease) stage 2, GFR 60-89 ml/min    Diabetic foot ulcer (HCC)    History of chicken pox    History of DVT (deep vein thrombosis)    s/p trauma of R leg -- 2014. No other history of DVT   History of kidney stones    Hypertension    Lazy eye of left side    NICM (nonischemic cardiomyopathy) ([place])    Pneumonia    Uncontrolled diabetes mellitus with hyperglycemia (HCC)     Objective: BP 108/70 (BP Location: Right Arm, Patient Position: Sitting, Cuff Size: Normal)   Pulse 88   Temp 98.4 F (36.9 C) (Oral)   Ht '5\' 10"'$  (1.778 m)   Wt 168 lb 2 oz (76.3 kg)   SpO2 93%   BMI 24.12 kg/m  General: Awake, appears stated age Lungs: No accessory muscle use Psych: Age appropriate judgment and insight, normal affect and mood  Assessment and Plan: Chronic heart failure, unspecified heart failure type ([place])  Diabetes mellitus due to underlying condition with diabetic autonomic neuropathy, unspecified whether long term insulin use ([place])  Form completed allowing for intermittently in order to bring her spouse/the patient to  specialty appointments.  A copy of the forms provided to the patient and we will fax it to today.  Follow-up as originally scheduled. The patient and his spouse voiced understanding and agreement to the plan.  I spent 20 minutes with the patient discussing the above plan in addition to reviewing his chart and filling out forms on its wife's behalf on the same day of the visit.  [place], DO 05/21/22  9:59 AM

## 2022-05-24 ENCOUNTER — Ambulatory Visit (INDEPENDENT_AMBULATORY_CARE_PROVIDER_SITE_OTHER): Payer: Commercial Managed Care - PPO | Admitting: Podiatry

## 2022-05-24 DIAGNOSIS — Z9889 Other specified postprocedural states: Secondary | ICD-10-CM

## 2022-05-26 NOTE — Progress Notes (Signed)
Chief Complaint  Patient presents with   Routine Post Op    POV #2 DOS 05/10/2022 EXCISION OF ACCESSORY OSSICLE RIGHT GREAT TOE, BONE BIOPSY RIGHT GREAT TOE    Subjective:  Patient presents today status post excision of with IPJ sesamoid with debridement of ulcer and bone biopsy right great toe.  DOS: 05/10/2022.  Patient continues to do well.  He continues to be WBAT surgical shoe.  No new complaints  Past Medical History:  Diagnosis Date   Asthma    CAD in native artery    CHF (congestive heart failure) (HCC)    Chronic HFrEF (heart failure with reduced ejection fraction) (HCC)    CKD (chronic kidney disease) stage 2, GFR 60-89 ml/min    Diabetic foot ulcer ([place])    History of chicken pox    History of DVT (deep vein thrombosis)    s/p trauma of R leg -- 2014. No other history of DVT   History of kidney stones    Hypertension    Lazy eye of left side    NICM (nonischemic cardiomyopathy) ([place])    Pneumonia    Uncontrolled diabetes mellitus with hyperglycemia [facility])     Past Surgical History:  Procedure Laterality Date   AMPUTATION TOE Left 05/16/2021   Procedure: AMPUTATION LEFT GREAT  TOE;  Surgeon: Trula Slade, DPM;  Location: [place];  Service: Podiatry;  Laterality: Left;   BONE BIOPSY Right 05/10/2022   Procedure: BONE BIOPSY;  Surgeon: [name], DPM;  [facility];  Service: Podiatry;  Laterality: Right;   EXOSTECTECTOMY TOE Right 05/10/2022   Procedure: EXOSTECTECTOMY TOE;  Surgeon: [name], DPM;  [facility];  Service: Podiatry;  Laterality: Right;   EYE SURGERY Right    cataract surgery  w/ IOL   RIGHT/LEFT HEART CATH AND CORONARY ANGIOGRAPHY N/A 03/11/2022   Procedure: RIGHT/LEFT HEART CATH AND CORONARY ANGIOGRAPHY;  Surgeon: Nelva Bush, MD;  Location: [place] [facility];  Service: Cardiovascular;  Laterality: N/A;    Allergies  Allergen Reactions   Bidil [Isosorb Dinitrate-Hydralazine] Other (See Comments)    Dropped BP    Zofran [Ondansetron Hcl] Nausea Only    Abdominal pain    Objective/Physical Exam Clinically the surgical toe appears to continually be healing nicely.  Neurovascular status intact.  Incision well coapted with sutures intact. No sign of infectious process noted. No dehiscence. No active bleeding noted.  The ulcer to the plantar aspect of the toe appears to be healing nicely with continued improvement since prior to surgery  Radiographic Exam RT foot 05/17/2022:  Stable.  Normal osseous mineralization.  No erosions to be concerning for osteomyelitis.  Assessment: 1. s/p IPJ sesamoidectomy with debridement of ulcer and bone biopsy right great toe. DOS: 05/10/2022 -Patient evaluated.  Sutures removed -Continue light dressing to the toe.  Overall the toe appears very stable and significantly improved.  He may begin to transition out of the postsurgical shoe back into his diabetic shoes - Patient has completed clindamycin 300 mg TID x 10 days based on IntraOp cultures  - Return to clinic 4 weeks   [name], DPM [facility]  Dr. [name], DPM    [address], [place] [address]  Office [phone]  Fax [phone]

## 2022-05-29 NOTE — Progress Notes (Incomplete)
***  In Progress***    Advanced Heart Failure Clinic Note   Referring Physician: [name]*  Primary Care: [name], DO Primary Cardiologist: Candee Furbish, MD  HPI:  John [name] is a 57 y.o. male with T2DM, asthma, HTN, CKDII, PAD w/ right diabetic foot ulcer and left great toe amputation that was most recently admitted to [facility] on 03/08/22 for intractable nausea and emesis; during this time he was found have an elevated troponin leading to TTE w/ LVEF 25%-30%; LHC w/ nonobstructive CAD and TD CI of 1.9L/min/m2. Prior to admission, other than significant nausea/vomiting, John [name] reports no other symptoms; no CP, SOB, LE edema. Since discharge from the hospital he has been seen in [facility] clinic where GDMT was further uptitrated.     At last visit with MD on 05/02/22, he had been doing very well. Asymptomatic with regards to HF; working in the [employer] with no limitations: no SOB, LE edema, CP.    Today he returns to HF clinic for pharmacist medication titration. At last visit with MD, BiDil 1 tab TID was initiated.   Shortness of breath/dyspnea on exertion? {YES NO:22349}  Orthopnea/PND? {YES NO:22349} Edema? {YES NO:22349} Lightheadedness/dizziness? {YES NO:22349} Daily weights at home? {YES NO:22349} Blood pressure/heart rate monitoring at home? {YES V2345720 Following low-sodium/fluid-restricted diet? {YES NO:22349}  HF Medications: Carvedilol 25 mg BID Entresto 97/103 mg BID  Spironolactone 25 mg daily  Farxiga 10 mg daily  BiDil 1 tab TID *** Furosemide 20 mg PRN  Has the patient been experiencing any side effects to the medications prescribed?  {YES NO:22349}  Does the patient have any problems obtaining medications due to transportation or finances?   [name]   Understanding of regimen: {excellent/good/fair/poor:19665} Understanding of indications: {excellent/good/fair/poor:19665} Potential of compliance:  {excellent/good/fair/poor:19665} Patient understands to avoid NSAIDs. Patient understands to avoid decongestants.    Pertinent Lab Values: 05/09/22: Serum creatinine 1.45, BUN 30, Potassium 3.7, Sodium 142, BNP 87.2 (05/02/22) Vital Signs: Weight: *** (last clinic weight: 168 lbs) Blood pressure: ***  Heart rate: ***   Assessment/Plan: Heart Failure with reduced EF - Nonischemic cardiomyopathy diagnosed at the time significant enteritis, poor PO intake and intractable N/V; possibly viral, however, cannot r/o hypertensive cardiomyopathy w/ history of uncontrolled HTN and LVH on TTE and ECG. No significant family history; CMR ordered & pending.  - NYHA class II, volume status *** - Continue Lasix PRN. - Continue carvedilol 25 mg BID  - Continue Entresto 97/103 mg BID  - continue spironolactone 25 mg daily  - Continue Farxiga 10 mg daily  - Continue BiDil 1 tablet TID    2. Resistant HTN - Continue carvedilol '25mg'$  BID and Entresto 97/103 BID.  - Contiune BiDil 1 tab TID   3. CKD - improving - repeat labs today    4. PAD - normal ABIs.  - distal extremity wounds 2/2 diabetes.    5. T2DM - A1C 10.5  Follow up ***  Sinda Du, PharmD Candidate  Audry Riles, PharmD, BCPS, BCCP, CPP Heart Failure Clinic Pharmacist [phone]

## 2022-05-30 ENCOUNTER — Other Ambulatory Visit (HOSPITAL_COMMUNITY): Payer: Self-pay

## 2022-05-30 ENCOUNTER — Ambulatory Visit (HOSPITAL_COMMUNITY)
Admission: RE | Admit: 2022-05-30 | Discharge: 2022-05-30 | Disposition: A | Discharge status: Home / Self Care | Payer: Commercial Managed Care - PPO | Source: Ambulatory Visit | Attending: Internal Medicine | Admitting: Internal Medicine

## 2022-05-30 VITALS — BP 148/84 | HR 88 | Wt 171.6 lb

## 2022-05-30 DIAGNOSIS — I5022 Chronic systolic (congestive) heart failure: Secondary | ICD-10-CM | POA: Insufficient documentation

## 2022-05-30 DIAGNOSIS — I1A0 Resistant hypertension: Secondary | ICD-10-CM | POA: Insufficient documentation

## 2022-05-30 DIAGNOSIS — Z79899 Other long term (current) drug therapy: Secondary | ICD-10-CM | POA: Diagnosis not present

## 2022-05-30 DIAGNOSIS — Z7984 Long term (current) use of oral hypoglycemic drugs: Secondary | ICD-10-CM | POA: Diagnosis not present

## 2022-05-30 DIAGNOSIS — E1151 Type 2 diabetes mellitus with diabetic peripheral angiopathy without gangrene: Secondary | ICD-10-CM | POA: Insufficient documentation

## 2022-05-30 DIAGNOSIS — I130 Hypertensive heart and chronic kidney disease with heart failure and stage 1 through stage 4 chronic kidney disease, or unspecified chronic kidney disease: Secondary | ICD-10-CM | POA: Insufficient documentation

## 2022-05-30 DIAGNOSIS — E1122 Type 2 diabetes mellitus with diabetic chronic kidney disease: Secondary | ICD-10-CM | POA: Insufficient documentation

## 2022-05-30 DIAGNOSIS — Z89412 Acquired absence of left great toe: Secondary | ICD-10-CM | POA: Insufficient documentation

## 2022-05-30 DIAGNOSIS — N182 Chronic kidney disease, stage 2 (mild): Secondary | ICD-10-CM | POA: Insufficient documentation

## 2022-05-30 MED ORDER — ISOSORB DINITRATE-HYDRALAZINE 20-37.5 MG PO TABS
0.5000 | ORAL_TABLET | Freq: Two times a day (BID) | ORAL | 3 refills | Status: DC
  Filled 2022-05-30: qty 90, 90d supply, fill #0
  Filled 2022-08-17: qty 90, 90d supply, fill #1
  Filled 2022-11-15: qty 90, 90d supply, fill #2
  Filled 2023-02-17: qty 90, 90d supply, fill #3

## 2022-05-30 MED ORDER — SPIRONOLACTONE 25 MG PO TABS
25.0000 mg | ORAL_TABLET | Freq: Every day | ORAL | 3 refills | Status: DC
  Filled 2022-05-30 – 2022-08-09 (×4): qty 90, 90d supply, fill #0
  Filled 2023-02-03: qty 90, 90d supply, fill #1

## 2022-05-30 NOTE — Patient Instructions (Signed)
It was a pleasure seeing you today!  MEDICATIONS: -We are changing your medications today -Increase spironolactone to 25 mg (1 tablet) daily -Call if you have questions about your medications.   NEXT APPOINTMENT: Return to clinic in 1 month with Dr. Daniel Nones.  In general, to take care of your heart failure: -Limit your fluid intake to 2 Liters (half-gallon) per day.   -Limit your salt intake to ideally 2-3 grams (2000-3000 mg) per day. -Weigh yourself daily and record, and bring that "weight diary" to your next appointment.  (Weight gain of 2-3 pounds in 1 day typically means fluid weight.) -The medications for your heart are to help your heart and help you live longer.   -Please contact us before stopping any of your heart medications.  Call the clinic at [phone] with questions or to reschedule future appointments.

## 2022-05-30 NOTE — Progress Notes (Signed)
Advanced Heart Failure Clinic Note   Referring Physician: Shelda [name]*  Primary Care: John Pal, DO Primary Cardiologist: John Furbish, MD HF Cardiologist: Dr. Daniel [name]  HPI:  John [name] is a 57 y.o. male with T2DM, asthma, HTN, CKDII, PAD w/ right diabetic foot ulcer and left great toe amputation that was most recently admitted to [facility] on 03/08/22 for intractable nausea and emesis; during this time he was found have an elevated troponin leading to TTE w/ LVEF 25%-30%; LHC w/ nonobstructive CAD and TD CI of 1.9L/min/m2. Prior to admission, other than significant nausea/vomiting, John [name] reported no other symptoms; no CP, SOB, LE edema. After discharge from the hospital he was seen in [facility] clinic where GDMT was further uptitrated.     At last visit with MD on 05/02/22, he had been doing very well. Asymptomatic with regards to HF; working in the [employer] with no limitations, no SOB, LE edema, CP.    Today he returns to HF clinic for pharmacist medication titration. At last visit with MD, BiDil 1 tab TID was initiated. However, after initiation of BiDil, it was noted that his BP dropped, endorsing lightheadedness, dizziness, nausea and vision changes, and BiDil was decreased to 1/2 tablet BID. Symptoms have resolved on lower dose of Bidil. Overall reports feeling well today. Denies dizziness, lightheadedness, fatigue, chest pain and palpitations. Denies SOB/DOE. Has not been weighing himself at home. Has not needed any PRN Lasix. No LEE, PND or orthopnea. Appetite has been good and is following a low-sodium diet. Home BP readings around 145/80 about 5-6 hours after taking AM medications. Has never used PRN Lasix at home.   HF Medications: Carvedilol 25 mg BID Entresto 97/103 mg BID  Spironolactone 12.5 mg daily  Farxiga 10 mg daily  BiDil 1/2 tab BID  Lasix 20 mg PRN   Has the patient been experiencing any side effects to the medications prescribed?  Did  experience lightheadedness, dizziness, nausea and blurry vision with BiDil 1 tab TID. This has since resolved with decrease in BiDil to 1/2 tab BID.    Does the patient have any problems obtaining medications due to transportation or finances?   John [name] [employer]  Understanding of regimen: excellent Understanding of indications: excellent Potential of compliance: excellent Patient understands to avoid NSAIDs. Patient understands to avoid decongestants.  Pertinent Lab Values: 05/09/22: Serum creatinine 1.45, BUN 30, Potassium 3.7, Sodium 142, BNP 87.2 (05/02/22)  Vital Signs: Weight: 171.6 lbs (last clinic weight: 168 lbs) Blood pressure: 148/84  Heart rate: 88   Assessment/Plan: Heart Failure with reduced EF - Nonischemic cardiomyopathy diagnosed at the time significant enteritis, poor PO intake and intractable N/V; possibly viral, however, cannot r/o hypertensive cardiomyopathy w/ history of uncontrolled HTN and LVH on TTE and ECG. No significant family history; CMR ordered and scheduled for 06/2022.  - NYHA class II, euvolemic on exam. - Continue Lasix 20 mg PRN. - Continue carvedilol 25 mg BID  - Continue Entresto 97/103 mg BID  - Increase spironolactone to 25 mg daily  - Continue Farxiga 10 mg daily  - Continue BiDil 1/2 tab BID. Can attempt slow uptitration in the future. He did not tolerate 1 tablet TID initially.     2. Resistant HTN - Increase spironolactone as above  3. CKD - improving  4. PAD - normal ABIs.  - distal extremity wounds 2/2 diabetes.   5. T2DM - A1C 10.5  Follow up for cMRI on 06/28/22 and  with MD on 07/15/22.  Sinda Du, PharmD Candidate  Audry Riles, PharmD, BCPS, BCCP, CPP Heart Failure Clinic Pharmacist [phone]

## 2022-05-31 ENCOUNTER — Other Ambulatory Visit: Payer: Self-pay | Admitting: Family Medicine

## 2022-05-31 DIAGNOSIS — E0843 Diabetes mellitus due to underlying condition with diabetic autonomic (poly)neuropathy: Secondary | ICD-10-CM

## 2022-05-31 DIAGNOSIS — Z794 Long term (current) use of insulin: Secondary | ICD-10-CM

## 2022-06-04 ENCOUNTER — Other Ambulatory Visit (HOSPITAL_COMMUNITY): Payer: Self-pay

## 2022-06-05 ENCOUNTER — Telehealth: Payer: Self-pay

## 2022-06-05 NOTE — Telephone Encounter (Signed)
Okay to provide a note for the patient.  He can come into the office and pick it up.  Ammie could you please write this for him?  Thank so much, Dr. Amalia Hailey

## 2022-06-05 NOTE — Telephone Encounter (Signed)
Patient called asking for a note to return to work on this Saturday 06/08/2022

## 2022-06-06 ENCOUNTER — Encounter: Payer: Self-pay | Admitting: Podiatry

## 2022-06-07 ENCOUNTER — Encounter: Payer: Commercial Managed Care - PPO | Admitting: Podiatry

## 2022-06-24 ENCOUNTER — Encounter: Payer: Commercial Managed Care - PPO | Admitting: Podiatry

## 2022-06-25 ENCOUNTER — Other Ambulatory Visit (HOSPITAL_COMMUNITY): Payer: Self-pay

## 2022-06-26 ENCOUNTER — Other Ambulatory Visit (HOSPITAL_COMMUNITY): Payer: Self-pay

## 2022-06-26 ENCOUNTER — Other Ambulatory Visit: Payer: Self-pay

## 2022-06-27 ENCOUNTER — Telehealth (HOSPITAL_COMMUNITY): Payer: Self-pay | Admitting: *Deleted

## 2022-06-27 NOTE — Telephone Encounter (Signed)
Attempted to call patient regarding upcoming cardiac MRI appointment. Left message on voicemail with name and callback number  Cuma Polyakov RN Navigator Cardiac Imaging [facility] [phone] Office [phone] Cell  

## 2022-06-28 ENCOUNTER — Other Ambulatory Visit (HOSPITAL_COMMUNITY): Payer: Self-pay | Admitting: Physician Assistant

## 2022-06-28 ENCOUNTER — Ambulatory Visit (HOSPITAL_COMMUNITY)
Admission: RE | Admit: 2022-06-28 | Discharge: 2022-06-28 | Disposition: A | Discharge status: Home / Self Care | Payer: Commercial Managed Care - PPO | Source: Ambulatory Visit | Attending: Physician Assistant | Admitting: Physician Assistant

## 2022-06-28 DIAGNOSIS — I428 Other cardiomyopathies: Secondary | ICD-10-CM

## 2022-06-28 MED ORDER — GADOBUTROL 1 MMOL/ML IV SOLN
7.0000 mL | Freq: Once | INTRAVENOUS | Status: AC | PRN
  Administered 2022-06-28: 7 mL via INTRAVENOUS

## 2022-07-02 ENCOUNTER — Other Ambulatory Visit (HOSPITAL_COMMUNITY): Payer: Self-pay

## 2022-07-03 ENCOUNTER — Other Ambulatory Visit: Payer: Self-pay

## 2022-07-03 ENCOUNTER — Other Ambulatory Visit (HOSPITAL_COMMUNITY): Payer: Self-pay

## 2022-07-12 ENCOUNTER — Ambulatory Visit: Payer: Commercial Managed Care - PPO | Admitting: Podiatry

## 2022-07-12 DIAGNOSIS — L97512 Non-pressure chronic ulcer of other part of right foot with fat layer exposed: Secondary | ICD-10-CM

## 2022-07-15 ENCOUNTER — Encounter (HOSPITAL_COMMUNITY): Payer: Self-pay | Admitting: Cardiology

## 2022-07-15 ENCOUNTER — Ambulatory Visit (HOSPITAL_COMMUNITY)
Admission: RE | Admit: 2022-07-15 | Discharge: 2022-07-15 | Disposition: A | Discharge status: Home / Self Care | Payer: Commercial Managed Care - PPO | Source: Ambulatory Visit | Attending: Cardiology | Admitting: Cardiology

## 2022-07-15 VITALS — BP 140/80 | HR 73 | Wt 175.0 lb

## 2022-07-15 DIAGNOSIS — Z79899 Other long term (current) drug therapy: Secondary | ICD-10-CM | POA: Diagnosis not present

## 2022-07-15 DIAGNOSIS — I2510 Atherosclerotic heart disease of native coronary artery without angina pectoris: Secondary | ICD-10-CM | POA: Insufficient documentation

## 2022-07-15 DIAGNOSIS — I5022 Chronic systolic (congestive) heart failure: Secondary | ICD-10-CM | POA: Insufficient documentation

## 2022-07-15 DIAGNOSIS — R42 Dizziness and giddiness: Secondary | ICD-10-CM | POA: Diagnosis not present

## 2022-07-15 DIAGNOSIS — I10 Essential (primary) hypertension: Secondary | ICD-10-CM | POA: Diagnosis not present

## 2022-07-15 DIAGNOSIS — G4730 Sleep apnea, unspecified: Secondary | ICD-10-CM | POA: Diagnosis not present

## 2022-07-15 DIAGNOSIS — E1165 Type 2 diabetes mellitus with hyperglycemia: Secondary | ICD-10-CM

## 2022-07-15 DIAGNOSIS — I428 Other cardiomyopathies: Secondary | ICD-10-CM | POA: Diagnosis not present

## 2022-07-15 DIAGNOSIS — R5383 Other fatigue: Secondary | ICD-10-CM | POA: Diagnosis not present

## 2022-07-15 DIAGNOSIS — L97509 Non-pressure chronic ulcer of other part of unspecified foot with unspecified severity: Secondary | ICD-10-CM | POA: Diagnosis not present

## 2022-07-15 DIAGNOSIS — Z794 Long term (current) use of insulin: Secondary | ICD-10-CM | POA: Insufficient documentation

## 2022-07-15 DIAGNOSIS — E13621 Other specified diabetes mellitus with foot ulcer: Secondary | ICD-10-CM

## 2022-07-15 DIAGNOSIS — K529 Noninfective gastroenteritis and colitis, unspecified: Secondary | ICD-10-CM | POA: Insufficient documentation

## 2022-07-15 DIAGNOSIS — Z7984 Long term (current) use of oral hypoglycemic drugs: Secondary | ICD-10-CM | POA: Insufficient documentation

## 2022-07-15 DIAGNOSIS — J45909 Unspecified asthma, uncomplicated: Secondary | ICD-10-CM | POA: Insufficient documentation

## 2022-07-15 DIAGNOSIS — E1122 Type 2 diabetes mellitus with diabetic chronic kidney disease: Secondary | ICD-10-CM | POA: Insufficient documentation

## 2022-07-15 DIAGNOSIS — I130 Hypertensive heart and chronic kidney disease with heart failure and stage 1 through stage 4 chronic kidney disease, or unspecified chronic kidney disease: Secondary | ICD-10-CM | POA: Diagnosis not present

## 2022-07-15 DIAGNOSIS — N182 Chronic kidney disease, stage 2 (mild): Secondary | ICD-10-CM | POA: Insufficient documentation

## 2022-07-15 LAB — BASIC METABOLIC PANEL
Anion gap: 7 (ref 5–15)
BUN: 28 mg/dL — ABNORMAL HIGH (ref 6–20)
CO2: 19 mmol/L — ABNORMAL LOW (ref 22–32)
Calcium: 9 mg/dL (ref 8.9–10.3)
Chloride: 114 mmol/L — ABNORMAL HIGH (ref 98–111)
Creatinine, Ser: 1.52 mg/dL — ABNORMAL HIGH (ref 0.61–1.24)
GFR, Estimated: 53 mL/min — ABNORMAL LOW (ref >60)
Glucose, Bld: 105 mg/dL — ABNORMAL HIGH (ref 70–99)
Potassium: 3.4 mmol/L — ABNORMAL LOW (ref 3.5–5.1)
Sodium: 140 mmol/L (ref 135–145)

## 2022-07-15 LAB — BRAIN NATRIURETIC PEPTIDE: B Natriuretic Peptide: 34.7 pg/mL (ref 0.0–100.0)

## 2022-07-15 NOTE — Patient Instructions (Signed)
There has been no changes to your medications.  Labs done today, your results will be available in MyChart, we will contact you for abnormal readings.  Your physician recommends that you schedule a follow-up appointment in: 3 months (July) ** please call the office in June to arrange your follow up appointment. **  If you have any questions or concerns before your next appointment please send [place] a message through [place] or call our office at [phone].    TO LEAVE A MESSAGE FOR THE NURSE SELECT OPTION 2, PLEASE LEAVE A MESSAGE INCLUDING: YOUR NAME DATE OF BIRTH CALL BACK NUMBER REASON FOR CALL**this is important as we prioritize the call backs  YOU WILL RECEIVE A CALL BACK THE SAME DAY AS LONG AS YOU CALL BEFORE 4:00 PM  At the Advanced Heart Failure Clinic, you and your health needs are our priority. As part of our continuing mission to provide you with exceptional heart care, we have created designated Provider Care Teams. These Care Teams include your primary Cardiologist (physician) and Advanced Practice Providers (APPs- Physician Assistants and Nurse Practitioners) who all work together to provide you with the care you need, when you need it.   You may see any of the following providers on your designated Care Team at your next follow up: Dr Arvilla Meres Dr Marca Ancona Dr. Marcos Eke, NP Robbie Lis, [place] [facility], [place] Brynda Peon, NP Karle Plumber, PharmD   Please be sure to bring in all your medications bottles to every appointment.    Thank you for choosing [facility]-Advanced [facility] Failure [facility]

## 2022-07-15 NOTE — Progress Notes (Signed)
ADVANCED HEART FAILURE CLINIC NOTE  Referring Physician: Sharlene Dory*  Primary Care: Sharlene Dory, DO Primary Cardiologist: Donato Schultz, MD  HPI: John [name] is a 57 y.o. male with T2DM, asthma, HTN, CKDII, PAD w/ right diabetic foot ulcer and left great toe amputation that was most recently admitted to [facility] on 03/08/22 for intractable nausea and emesis; during this time he was found have an elevated troponin leading to TTE w/ LVEF 25%-30%; LHC w/ nonobstructive CAD and  TD CI of 1.9L/min/m2. Prior to admission, other than significant nausea/vomiting, John [name] reports no other symptoms; no CP, SOB, LE edema.  Since discharge from the hospital he has been seen in [facility] clinic where GDMT was further uptitrated.    Interval hx:  From a functional standpoint John [name] is doing remarkably well.  He continues to work in the [employer] with no significant limitations.  He is no longer having shortness of breath.  His only issue at this time is feeling very fatigued and lightheaded after taking BiDil.  After lowering BiDil dose to half tab 3 times daily he has had improvement in the symptoms.  Activity level/exercise tolerance:  NYHA II; doing very well; continuing to work without difficulty.  Orthopnea:  Sleeps on 2 pillows Paroxysmal noctural dyspnea:  no Chest pain/pressure:  no Orthostatic lightheadedness:  no Palpitations:  no Lower extremity edema:  no Presyncope/syncope:  no Cough:  no  Past Medical History:  Diagnosis Date   Asthma    CAD in native artery    CHF (congestive heart failure)    Chronic HFrEF (heart failure with reduced ejection fraction)    CKD (chronic kidney disease) stage 2, GFR 60-89 ml/min    Diabetic foot ulcer    History of chicken pox    History of DVT (deep vein thrombosis)    s/p trauma of R leg -- 2014. No other history of DVT   History of kidney stones    Hypertension    Lazy eye of left side    NICM (nonischemic  cardiomyopathy)    Pneumonia    Uncontrolled diabetes mellitus with hyperglycemia     Current Outpatient Medications  Medication Sig Dispense Refill   albuterol (VENTOLIN HFA) 108 (90 Base) MCG/ACT inhaler Inhale 2 puffs into the lungs every 4 (four) hours as needed for wheezing or shortness of breath. 6.7 g 1   atorvastatin (LIPITOR) 80 MG tablet Take 1 tablet (80 mg total) by mouth daily. 90 tablet 3   carvedilol (COREG) 25 MG tablet Take 1 tablet (25 mg total) by mouth 2 (two) times daily with a meal. 180 tablet 2   clindamycin (CLEOCIN) 300 MG capsule Take 1 capsule (300 mg total) by mouth 3 (three) times daily. 30 capsule 0   dapagliflozin propanediol (FARXIGA) 10 MG TABS tablet Take 1 tablet (10 mg total) by mouth daily before breakfast. 90 tablet 2   furosemide (LASIX) 20 MG tablet Take 1 tablet (20 mg total) by mouth daily as needed for edema or fluid. 30 tablet 0   insulin degludec (TRESIBA FLEXTOUCH) 100 UNIT/ML FlexTouch Pen Inject 15 Units into the skin at bedtime. 15 mL 1   aspirin 81 MG chewable tablet Chew 81 mg by mouth daily.     glucose blood test strip Use as instructed, Check blood sugars twice daily (Patient not taking: Reported on 07/15/2022) 100 each 12   Insulin Pen Needle (NOVOFINE PLUS PEN NEEDLE) 32G X 4 MM MISC Use daily  to insulin 50 each 1   isosorbide-hydrALAZINE (BIDIL) 20-37.5 MG tablet Take 1/2 tablet by mouth 2 (two) times daily. 90 tablet 3   sacubitril-valsartan (ENTRESTO) 97-103 MG Take 1 tablet by mouth 2 (two) times daily. 60 tablet 6   spironolactone (ALDACTONE) 25 MG tablet Take 1 tablet (25 mg total) by mouth daily. 90 tablet 3   No current facility-administered medications for this encounter.    Allergies  Allergen Reactions   Bidil [Isosorb Dinitrate-Hydralazine] Other (See Comments)    Dropped BP - but has tolerated lower dose   Zofran [Ondansetron Hcl] Nausea Only    Abdominal pain      Social History   Socioeconomic History   Marital  status: Married    Spouse name: [name]   Number of children: 1   Years of education: Not on file   Highest education level: High school graduate  Occupational History   Occupation: Financial risk analyst    Comment: [employer]   Tobacco Use   Smoking status: Never   Smokeless tobacco: Never  Vaping Use   Vaping Use: Never used  Substance and Sexual Activity   Alcohol use: No   Drug use: No   Sexual activity: Yes    Partners: Female  Other Topics Concern   Not on file  Social History Narrative   Not on file   Social Determinants of Health   Financial Resource Strain: Medium Risk (03/12/2022)   Overall Financial Resource Strain (CARDIA)    Difficulty of Paying Living Expenses: Somewhat hard  Food Insecurity: No Food Insecurity (03/09/2022)   Hunger Vital Sign    Worried About Running Out of Food in the Last Year: Never true    Ran Out of Food in the Last Year: Never true  Transportation Needs: No Transportation Needs (03/12/2022)   PRAPARE - Administrator, Civil Service (Medical): No    Lack of Transportation (Non-Medical): No  Physical Activity: Not on file  Stress: Not on file  Social Connections: Not on file  Intimate Partner Violence: Not At Risk (03/09/2022)   Humiliation, Afraid, Rape, and Kick questionnaire    Fear of Current or Ex-Partner: No    Emotionally Abused: No    Physically Abused: No    Sexually Abused: No      Family History  Problem Relation Age of Onset   Heart disease Mother 71   Arthritis Mother    Heart disease Father 60   Stroke Father    Healthy Brother    Heart disease Maternal Grandmother    Diabetes Maternal Uncle     PHYSICAL EXAM: Vitals:   07/15/22 1452  BP: (!) 140/80  Pulse: 73  SpO2: 99%   GENERAL: Well nourished, well developed, and in no apparent distress at rest.  HEENT: Negative for arcus senilis or xanthelasma. There is no scleral icterus.  The mucous membranes are pink and moist.   NECK: Supple, No  masses. Normal carotid upstrokes without bruits. No masses or thyromegaly.    CHEST: There are no chest wall deformities. There is no chest wall tenderness. Respirations are unlabored.  Lungs-CTA bilaterally CARDIAC:  JVP: 7 cm          Normal rate with regular rhythm. No murmurs, rubs or gallops.  Pulses are 2+ and symmetrical in upper and lower extremities.  No edema.  ABDOMEN: Soft, non-tender, non-distended. There are no masses or hepatomegaly. There are normal bowel sounds.  EXTREMITIES: Warm and well perfused with no  cyanosis, clubbing.  LYMPHATIC: No axillary or supraclavicular lymphadenopathy.  NEUROLOGIC: Patient is oriented x3 with no focal or lateralizing neurologic deficits.  PSYCH: Patients affect is appropriate, there is no evidence of anxiety or depression.  SKIN: Warm and dry; no lesions or wounds.    DATA REVIEW  ECG: NSR w/ LVH  ECHO: 03/10/22: LVEF 25%-30%; RVF is normal. Mild-moderate LVH.   CATH:03/11/22 Non-obstructive coronary artery disease, consistent with nonischemic cardiomyopathy.  Most severe lesion is a 60-70% stenosis involving small, non-dominant RCA.  There is a 20-30% proximal LAD stenosis as well as mild luminal irregularities in the ramus intermedius. Upper normal to mildly elevated left heart and pulmonary artery pressures (PCWP 14 mmHg, LVEDP 18 mmHg, mean PA 22 mmHg). Normal right heart filling pressures (RA/RVEDP 6 mmHg). Normal Fick cardiac output/index (CO 4.8 L/min, 2.6 L/min/m^2). Decreased thermodilution cardiac output/index (CO 3.5 L/min, 1.9 L/min/m^2).  CMR (06/28/22) 1. Mildly dilated LV with mild LV hypertrophy. EF 43%, global hypokinesis.  2.  Normal RV size with EF 52%.  3. Small area of <50% wall thickness subendocardial LGE in the mid inferior wall. This could be a small area of prior infarction.  ASSESSMENT & PLAN:  Heart Failure with reduced EF Etiology of ZO:[id number] cardiomyopathy diagnosed at the time significant  enteritis, poor PO intake and intractable N/V; possibly viral, however, cannot r/o hypertensive cardiomyopathy w/ history of uncontrolled HTN and LVH on TTE and ECG. No significant family history; CMR ordered & pending.  NYHA class / AHA Stage:II, doing very well.  Volume status & Diuretics: Euvolemic. Lasix  PRN only now.  Vasodilators:Entresto 97/103 BID, taking bidil 1/2 tab twice daily due to symptomatic hypotension.  I have asked him to check his blood pressure throughout the day after taking BiDil and report these values back to [place] so that we can appropriately titrate medications. Beta-Blocker:coreg  BID JXB:[id number]  daily, repeat BMP today Cardiometabolic:jardiance  daily Devices therapies & Valvulopathies:not currently indicated; CMR with improved in LVEF to 43% Advanced therapies:not indicated.   2. Resistant HTN - coreg  BID, entresto 97/103 BID.  - Bidil 1 tab TID - BP in 130s/80s at home before taking meds  3. CKD - improving -Repeat lab today  4. PAD - normal ABIs.  - distal extremity wounds 2/2 diabetes.   5. T2DM - A1C 10.5  Lavada Langsam Advanced Heart Failure Mechanical Circulatory Support

## 2022-07-16 NOTE — Progress Notes (Signed)
Chief Complaint  Patient presents with   Routine Post Op    POV #3 DOS 05/10/2022 EXCISION OF ACCESSORY OSSICLE RIGHT GREAT TOE, BONE BIOPSY RIGHT GREAT TOE    Subjective:  Patient presents today status post excision of with IPJ sesamoid with debridement of ulcer and bone biopsy right great toe.  DOS: 05/10/2022.  Patient is doing well.  He is completely Perrett neuropathic and has no pain associated to the toe.  He continues to be WBAT in the surgical shoe.  No new complaints at this time  Past Medical History:  Diagnosis Date   Asthma    CAD in native artery    CHF (congestive heart failure)    Chronic HFrEF (heart failure with reduced ejection fraction)    CKD (chronic kidney disease) stage 2, GFR 60-89 ml/min    Diabetic foot ulcer    History of chicken pox    History of DVT (deep vein thrombosis)    s/p trauma of R leg -- 2014. No other history of DVT   History of kidney stones    Hypertension    Lazy eye of left side    NICM (nonischemic cardiomyopathy)    Pneumonia    Uncontrolled diabetes mellitus with hyperglycemia     Past Surgical History:  Procedure Laterality Date   AMPUTATION TOE Left 05/16/2021   Procedure: AMPUTATION LEFT GREAT  TOE;  Surgeon: Vivi Barrack, DPM;  Location: [facility] OR;  Service: Podiatry;  Laterality: Left;   BONE BIOPSY Right 05/10/2022   Procedure: BONE BIOPSY;  Surgeon: Felecia Shelling, DPM;  Location: [facility] ORS;  Service: Podiatry;  Laterality: Right;   EXOSTECTECTOMY TOE Right 05/10/2022   Procedure: EXOSTECTECTOMY TOE;  Surgeon: Felecia Shelling, DPM;  Location: [facility] ORS;  Service: Podiatry;  Laterality: Right;   EYE SURGERY Right    cataract surgery  w/ IOL   RIGHT/LEFT HEART CATH AND CORONARY ANGIOGRAPHY N/A 03/11/2022   Procedure: RIGHT/LEFT HEART CATH AND CORONARY ANGIOGRAPHY;  Surgeon: Yvonne Kendall, MD;  Location: [facility] INVASIVE CV LAB;  Service: Cardiovascular;  Laterality: N/A;    Allergies  Allergen Reactions   Bidil [Isosorb  Dinitrate-Hydralazine] Other (See Comments)    Dropped BP - but has tolerated lower dose   Zofran [Ondansetron Hcl] Nausea Only    Abdominal pain    Objective/Physical Exam There continues to be maceration with hyperkeratotic callus tissue along the plantar aspect of the right great toe.  There is some slight maceration as well around this area.  Radiographic Exam RT foot 05/17/2022:  Stable.  Normal osseous mineralization.  No erosions to be concerning for osteomyelitis.  Assessment: 1. s/p IPJ sesamoidectomy with debridement of ulcer and bone biopsy right great toe. DOS: 05/10/2022 -Patient evaluated.   -Excisional debridement of the ulcer to the plantar aspect of the right great toe was performed today using a tissue nipper.  Excisional debridement of the necrotic nonviable tissue down to healthy bleeding viable tissue -Excisional debridement of the preulcerative callus tissue was also performed today using a 312 scalpel and tissue nipper. -I do believe the patient would benefit from hallux IPJ arthrodesis however in the presence of infection I do not want to implant any hardware into the great toe.  The patient understands.  I would also like to see if the patient can reduce his A1c levels below 8.0 prior to surgery. -Repeat A1c levels with PCP in 1 month.  Return to clinic after to discuss possible surgery which would include  arthrodesis of the right hallux IPJ  Felecia Shelling, DPM [facility]  Dr. Felecia Shelling, DPM    [address] [place], [place] [address]                Office [phone]  Fax [phone]

## 2022-07-18 ENCOUNTER — Encounter: Payer: Self-pay | Admitting: "Endocrinology

## 2022-07-19 ENCOUNTER — Other Ambulatory Visit (HOSPITAL_COMMUNITY): Payer: Self-pay

## 2022-08-05 ENCOUNTER — Other Ambulatory Visit: Payer: Self-pay

## 2022-08-05 ENCOUNTER — Other Ambulatory Visit (HOSPITAL_COMMUNITY): Payer: Self-pay

## 2022-08-09 ENCOUNTER — Other Ambulatory Visit (HOSPITAL_COMMUNITY): Payer: Self-pay

## 2022-08-21 ENCOUNTER — Ambulatory Visit (INDEPENDENT_AMBULATORY_CARE_PROVIDER_SITE_OTHER): Payer: Commercial Managed Care - PPO | Admitting: Podiatry

## 2022-08-21 ENCOUNTER — Ambulatory Visit (INDEPENDENT_AMBULATORY_CARE_PROVIDER_SITE_OTHER): Payer: Commercial Managed Care - PPO

## 2022-08-21 ENCOUNTER — Other Ambulatory Visit (HOSPITAL_COMMUNITY): Payer: Self-pay

## 2022-08-21 ENCOUNTER — Telehealth: Payer: Self-pay | Admitting: Podiatry

## 2022-08-21 DIAGNOSIS — L97512 Non-pressure chronic ulcer of other part of right foot with fat layer exposed: Secondary | ICD-10-CM

## 2022-08-21 DIAGNOSIS — E0843 Diabetes mellitus due to underlying condition with diabetic autonomic (poly)neuropathy: Secondary | ICD-10-CM | POA: Diagnosis not present

## 2022-08-21 NOTE — Telephone Encounter (Signed)
Pts wife called and pt was seen today and said an antibiotic was to have been sent in and the pharmacy does not have it. He uses the [facility]. Please let me know and  I can let pt know.

## 2022-08-21 NOTE — Telephone Encounter (Signed)
Patient called and left VM on Rn line that the prescription has not been received. Please contact patient.

## 2022-08-21 NOTE — Progress Notes (Signed)
Chief Complaint  Patient presents with   Diabetic Ulcer    Diabetic ulcer right hallux, A1c- 9.0 BG- not taking, Drainage, swelling, No N/V/F/C/SOB, X-Rays taking today, patient is still waiting on wound care,     Subjective:  Patient presents today status post excision of with IPJ sesamoid with debridement of ulcer and bone biopsy right great toe.  DOS: 05/10/2022.  Patient states continues to have ulcer development to the plantar aspect of the IPJ of the toe. Past Medical History:  Diagnosis Date   Asthma    CAD in native artery    CHF (congestive heart failure) (HCC)    Chronic HFrEF (heart failure with reduced ejection fraction) (HCC)    CKD (chronic kidney disease) stage 2, GFR 60-89 ml/min    Diabetic foot ulcer (HCC)    History of chicken pox    History of DVT (deep vein thrombosis)    s/p trauma of R leg -- 2014. No other history of DVT   History of kidney stones    Hypertension    Lazy eye of left side    NICM (nonischemic cardiomyopathy) (HCC)    Pneumonia    Uncontrolled diabetes mellitus with hyperglycemia [facility])     Past Surgical History:  Procedure Laterality Date   AMPUTATION TOE Left 05/16/2021   Procedure: AMPUTATION LEFT GREAT  TOE;  Surgeon: Vivi Barrack, DPM;  Location: [facility] OR;  Service: Podiatry;  Laterality: Left;   BONE BIOPSY Right 05/10/2022   Procedure: BONE BIOPSY;  Surgeon: Felecia Shelling, DPM;  Location: [facility];  Service: Podiatry;  Laterality: Right;   EXOSTECTECTOMY TOE Right 05/10/2022   Procedure: EXOSTECTECTOMY TOE;  Surgeon: Felecia Shelling, DPM;  Location: [facility];  Service: Podiatry;  Laterality: Right;   EYE SURGERY Right    cataract surgery  w/ IOL   RIGHT/LEFT HEART CATH AND CORONARY ANGIOGRAPHY N/A 03/11/2022   Procedure: RIGHT/LEFT HEART CATH AND CORONARY ANGIOGRAPHY;  Surgeon: Yvonne Kendall, MD;  [facility] INV[facility];  Service: Cardiovascular;  Laterality: N/A;    Allergies  Allergen Reactions   Bidil [Isosorb  Dinitrate-Hydralazine] Other (See Comments)    Dropped BP - but has tolerated lower dose   Zofran [Ondansetron Hcl] Nausea Only    Abdominal pain    Objective/Physical Exam There continues to be maceration with hyperkeratotic callus tissue along the plantar aspect of the right great toe.  There is some slight maceration as well around this area.  Radiographic Exam RT foot 08/21/2022:  Complete dorsal displacement of the IPJ noted to the great toe on lateral view creating retrograde pressure to the IPJ.  No obvious indication of osseous erosions or pathologic fracture.  MR TOES RIGHT WO CONTRAST 04/28/2022: IMPRESSION: 1. Apparent soft tissue ulceration along the plantar aspect of the great toe at the level of the interphalangeal joint with surrounding soft tissue edema, but no focal fluid collection. 2. T2 hyperintensity throughout the distal 1st phalanx with mild associated decreased T1 marrow signal, suspicious for early osteomyelitis. No cortical destruction identified. 3. Probable rupture of the flexor hallucis longus tendon at the level of the 1st metatarsal.  Assessment: 1. s/p IPJ sesamoidectomy with debridement of ulcer and bone biopsy right great toe. DOS: 05/10/2022  -Patient evaluated.   - Medically necessary excisional debridement including subcutaneous tissue was performed today using a tissue nipper.  Medically necessary excisional debridement including subcutaneous tissue was performed today using a tissue nipper.  Excisional debridement of the necrotic nonviable tissue  down to healthier bleeding viable tissue was performed with postdebridement measurement same as pre- -Recommend Betadine wet-to-dry dressings daily -Continue diabetic shoes with custom molded insoles -patient has an appointment with endocrinology for updated A1c and diabetes management.  I am concerned that if the ulcer continues to the plantar aspect of the IPJ this will lead to osteomyelitis and he will  eventually lose the great toe.  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  Will discuss further after appointment with endocrinology and to see what his updated A1c is -Return to clinic 3 weeks   Felecia Shelling, DPM [facility]  Dr. Felecia Shelling, DPM    [address] [place], [place] [address]                Office [phone]  Fax [phone]

## 2022-08-22 ENCOUNTER — Other Ambulatory Visit: Payer: Self-pay | Admitting: Podiatry

## 2022-08-22 ENCOUNTER — Other Ambulatory Visit (HOSPITAL_COMMUNITY): Payer: Self-pay

## 2022-08-22 ENCOUNTER — Telehealth: Payer: Self-pay | Admitting: Podiatry

## 2022-08-22 MED ORDER — CLINDAMYCIN HCL 300 MG PO CAPS
300.0000 mg | ORAL_CAPSULE | Freq: Three times a day (TID) | ORAL | 0 refills | Status: DC
  Filled 2022-08-22: qty 30, 10d supply, fill #0

## 2022-08-22 NOTE — Telephone Encounter (Signed)
Pt called to inquire about antibiotic Rx; it has not been received at the pharmacy. Please advise

## 2022-08-22 NOTE — Telephone Encounter (Signed)
Patient's wife, Wayna Chalet, called this morning, stating that she and her husband were here yesterday for an appointment.  The Rx that was supposed to be sent to her husband's pharmacy wasn't received.    I called her back and spoke to her, inquiring about whether she knew if Dr. Logan Bores mentioned it with them during the appointment which medication it was, or if he planned on ordering more Cleocin since he had prescribed that in the past for them.  And, she was also asked how long he was supposed to be on it.  She wasn't sure and didn't know the name of the antibiotic he planned to send in, so will route this message to Dr. Logan Bores to send in the antibiotic for the patient.  Informed her he was in surgery today, so it may not be until the end of the day that he could respond and take care of this.  She noted this patient does have an upcoming surgery with Dr. Logan Bores.  She was appreciative.  -Addison Freimuth

## 2022-08-22 NOTE — Progress Notes (Signed)
Clindamycin 300mg  TID #30 no refills.

## 2022-08-23 ENCOUNTER — Other Ambulatory Visit: Payer: Self-pay | Admitting: Podiatry

## 2022-08-23 DIAGNOSIS — L97512 Non-pressure chronic ulcer of other part of right foot with fat layer exposed: Secondary | ICD-10-CM

## 2022-08-23 DIAGNOSIS — E0843 Diabetes mellitus due to underlying condition with diabetic autonomic (poly)neuropathy: Secondary | ICD-10-CM

## 2022-08-29 NOTE — Patient Instructions (Signed)

## 2022-08-30 ENCOUNTER — Other Ambulatory Visit (HOSPITAL_COMMUNITY): Payer: Self-pay

## 2022-08-30 ENCOUNTER — Ambulatory Visit: Payer: Commercial Managed Care - PPO | Admitting: Nurse Practitioner

## 2022-08-30 ENCOUNTER — Encounter: Payer: Self-pay | Admitting: Nurse Practitioner

## 2022-08-30 VITALS — BP 148/78 | HR 78 | Ht 70.0 in | Wt 179.0 lb

## 2022-08-30 DIAGNOSIS — E1165 Type 2 diabetes mellitus with hyperglycemia: Secondary | ICD-10-CM

## 2022-08-30 DIAGNOSIS — L97509 Non-pressure chronic ulcer of other part of unspecified foot with unspecified severity: Secondary | ICD-10-CM | POA: Diagnosis not present

## 2022-08-30 DIAGNOSIS — Z794 Long term (current) use of insulin: Secondary | ICD-10-CM

## 2022-08-30 DIAGNOSIS — E11621 Type 2 diabetes mellitus with foot ulcer: Secondary | ICD-10-CM | POA: Diagnosis not present

## 2022-08-30 DIAGNOSIS — Z7984 Long term (current) use of oral hypoglycemic drugs: Secondary | ICD-10-CM

## 2022-08-30 LAB — POCT GLYCOSYLATED HEMOGLOBIN (HGB A1C): Hemoglobin A1C: 7.2 % — AB (ref 4.0–5.6)

## 2022-08-30 MED ORDER — TRESIBA FLEXTOUCH 100 UNIT/ML ~~LOC~~ SOPN
15.0000 [IU] | PEN_INJECTOR | Freq: Every day | SUBCUTANEOUS | 3 refills | Status: DC
  Filled 2022-08-30: qty 15, 100d supply, fill #0
  Filled 2022-09-25: qty 12, 80d supply, fill #0
  Filled 2022-12-05: qty 12, 80d supply, fill #1

## 2022-08-30 MED ORDER — DAPAGLIFLOZIN PROPANEDIOL 10 MG PO TABS
10.0000 mg | ORAL_TABLET | Freq: Every day | ORAL | 3 refills | Status: DC
  Filled 2022-08-30 – 2022-09-02 (×2): qty 30, 30d supply, fill #0
  Filled 2022-09-02: qty 90, 90d supply, fill #0
  Filled 2022-10-03: qty 30, 30d supply, fill #1
  Filled 2022-11-02: qty 30, 30d supply, fill #2
  Filled 2022-12-06 (×2): qty 30, 30d supply, fill #3
  Filled 2023-01-04: qty 30, 30d supply, fill #4
  Filled 2023-02-03: qty 30, 30d supply, fill #5
  Filled 2023-03-12: qty 30, 30d supply, fill #6
  Filled 2023-04-05: qty 30, 30d supply, fill #7
  Filled 2023-05-10: qty 30, 30d supply, fill #8
  Filled 2023-06-10: qty 30, 30d supply, fill #9
  Filled 2023-07-09: qty 30, 30d supply, fill #10

## 2022-08-30 MED ORDER — FREESTYLE LIBRE 3 SENSOR MISC
3 refills | Status: DC
  Filled 2022-08-30: qty 6, 84d supply, fill #0
  Filled 2022-11-21 (×2): qty 6, 84d supply, fill #1
  Filled 2023-02-15: qty 6, 84d supply, fill #2
  Filled 2023-05-06: qty 6, 84d supply, fill #3

## 2022-08-30 NOTE — Progress Notes (Signed)
Endocrinology Consult Note       08/30/2022, 11:38 AM   Subjective:    Patient ID: John [name], male    DOB: 10/15/1965.  Ke[place] is being seen in consultation for management of currently uncontrolled symptomatic diabetes requested by  Sharlene Dory, DO.  He is a [employer], works in [employer].   Past Medical History:  Diagnosis Date   Asthma    CAD in native artery    CHF (congestive heart failure) (HCC)    Chronic HFrEF (heart failure with reduced ejection fraction) (HCC)    CKD (chronic kidney disease) stage 2, GFR 60-89 ml/min    Diabetic foot ulcer (HCC)    History of chicken pox    History of DVT (deep vein thrombosis)    s/p trauma of R leg -- 2014. No other history of DVT   History of kidney stones    Hypertension    Lazy eye of left side    NICM (nonischemic cardiomyopathy) (HCC)    Pneumonia    Uncontrolled diabetes mellitus with hyperglycemia [facility])     Past Surgical History:  Procedure Laterality Date   AMPUTATION TOE Left 05/16/2021   Procedure: AMPUTATION LEFT GREAT  TOE;  Surgeon: Vivi Barrack, DPM;  [facility];  Service: Podiatry;  Laterality: Left;   BONE BIOPSY Right 05/10/2022   Procedure: BONE BIOPSY;  Surgeon: Felecia Shelling, DPM;  [facility];  Service: Podiatry;  Laterality: Right;   EXOSTECTECTOMY TOE Right 05/10/2022   Procedure: EXOSTECTECTOMY TOE;  Surgeon: Felecia Shelling, DPM;  Location[facility];  Service: Podiatry;  Laterality: Right;   EYE SURGERY Right    cataract surgery  w/ IOL   RIGHT/LEFT HEART CATH AND CORONARY ANGIOGRAPHY N/A 03/11/2022   Procedure: RIGHT/LEFT HEART CATH AND CORONARY ANGIOGRAPHY;  Surgeon: Yvonne Kendall, MD;  [facility] INV[facility] LAB;  Service: Cardiovascular;  Laterality: N/A;    Social History   Socioeconomic History   Marital status: Married    Spouse name: [name]   Number of children: 1   Years  of education: Not on file   Highest education level: High school graduate  Occupational History   Occupation: Financial risk analyst    Comment: [employer]   Tobacco Use   Smoking status: Never   Smokeless tobacco: Never  Vaping Use   Vaping Use: Never used  Substance and Sexual Activity   Alcohol use: No   Drug use: No   Sexual activity: Yes    Partners: Female  Other Topics Concern   Not on file  Social History Narrative   Not on file   Social Determinants of Health   Financial Resource Strain: Medium Risk (03/12/2022)   Overall Financial Resource Strain (CARDIA)    Difficulty of Paying Living Expenses: Somewhat hard  Food Insecurity: No Food Insecurity (03/09/2022)   Hunger Vital Sign    Worried About Running Out of Food in the Last Year: Never true    Ran Out of Food in the Last Year: Never true  Transportation Needs: No Transportation Needs (03/12/2022)   PRAPARE - Administrator, Civil Service (Medical): No  Lack of Transportation (Non-Medical): No  Physical Activity: Not on file  Stress: Not on file  Social Connections: Not on file    Family History  Problem Relation Age of Onset   Heart disease Mother 62   Arthritis Mother    Heart disease Father 55   Stroke Father    Healthy Brother    Heart disease Maternal Grandmother    Diabetes Maternal Uncle     Outpatient Encounter Medications as of 08/30/2022  Medication Sig   albuterol (VENTOLIN HFA) 108 (90 Base) MCG/ACT inhaler Inhale 2 puffs into the lungs every 4 (four) hours as needed for wheezing or shortness of breath.   aspirin 81 MG chewable tablet Chew 81 mg by mouth daily.   atorvastatin (LIPITOR) 80 MG tablet Take 1 tablet (80 mg total) by mouth daily.   carvedilol (COREG) 25 MG tablet Take 1 tablet (25 mg total) by mouth 2 (two) times daily with a meal.   clindamycin (CLEOCIN) 300 MG capsule Take 1 capsule (300 mg total) by mouth 3 (three) times daily.   Continuous Glucose Sensor (FREESTYLE  LIBRE 3 SENSOR) MISC Place 1 sensor on the skin every 14 days. Use to check glucose continuously   furosemide (LASIX) 20 MG tablet Take 1 tablet (20 mg total) by mouth daily as needed for edema or fluid.   Insulin Pen Needle (NOVOFINE PLUS PEN NEEDLE) 32G X 4 MM MISC Use daily to insulin   isosorbide-hydrALAZINE (BIDIL) 20-37.5 MG tablet Take 1/2 tablet by mouth 2 (two) times daily.   sacubitril-valsartan (ENTRESTO) 97-103 MG Take 1 tablet by mouth 2 (two) times daily.   spironolactone (ALDACTONE) 25 MG tablet Take 1 tablet (25 mg total) by mouth daily.   [DISCONTINUED] dapagliflozin propanediol (FARXIGA) 10 MG TABS tablet Take 1 tablet (10 mg total) by mouth daily before breakfast.   [DISCONTINUED] insulin degludec (TRESIBA FLEXTOUCH) 100 UNIT/ML FlexTouch Pen Inject 15 Units into the skin at bedtime.   dapagliflozin propanediol (FARXIGA) 10 MG TABS tablet Take 1 tablet (10 mg total) by mouth daily before breakfast.   glucose blood test strip Use as instructed, Check blood sugars twice daily (Patient not taking: Reported on 07/15/2022)   insulin degludec (TRESIBA FLEXTOUCH) 100 UNIT/ML FlexTouch Pen Inject 15 Units into the skin at bedtime.   No facility-administered encounter medications on file as of 08/30/2022.    ALLERGIES: Allergies  Allergen Reactions   Bidil [Isosorb Dinitrate-Hydralazine] Other (See Comments)    Dropped BP - but has tolerated lower dose   Zofran [Ondansetron Hcl] Nausea Only    Abdominal pain    VACCINATION STATUS: Immunization History  Administered Date(s) Administered   Influenza,inj,Quad PF,6+ Mos 01/13/2019, 01/04/2020   Influenza-Unspecified 11/24/2018, 11/28/2021   PFIZER(Purple Top)SARS-COV-2 Vaccination 06/03/2019, 06/24/2019, 01/10/2020, 01/04/2022   Pneumococcal Polysaccharide-23 10/18/2015   Tdap 06/25/2012    Diabetes He presents for his initial diabetic visit. He has type 2 diabetes mellitus. Onset time: Diagnosed at approx age of 57. His disease  course has been improving. There are no hypoglycemic associated symptoms. There are no diabetic associated symptoms. There are no hypoglycemic complications. Diabetic complications include heart disease (CHF), nephropathy and PVD. Risk factors for coronary artery disease include diabetes mellitus, male sex, hypertension, dyslipidemia and family history. Current diabetic treatment includes oral agent (monotherapy) and insulin injections. He is compliant with treatment most of the time. His weight is fluctuating minimally. He is following a generally healthy diet. Meal planning includes avoidance of concentrated sweets. He has not had  a previous visit with a dietitian. He participates in exercise intermittently. (He presents today, accompanied by his wife, for his consultation with no meter or logs to review.  He has been using CGM but had to take it off for MRI and has not reapplied.  His POCT A1c today is 7.2%, improving from last A1c of 8.5%.  He has been dealing with diabetic foot ulcer, needing surgery to correct.  He drinks mostly water (will have occasional zero sugar sprite), eats 2 meals per day with a snack between.  He does not engage in routine physical activity.  He is UTD on eye exam, sees podiatry routinely.) An ACE inhibitor/angiotensin II receptor blocker is being taken. He sees a podiatrist.Eye exam is current.     Review of systems  Constitutional: + Minimally fluctuating body weight, current Body mass index is 25.68 kg/m., no fatigue, no subjective hyperthermia, no subjective hypothermia Eyes: no blurry vision, no xerophthalmia ENT: no sore throat, no nodules palpated in throat, no dysphagia/odynophagia, no hoarseness Cardiovascular: no chest pain, no shortness of breath, no palpitations, no leg swelling Respiratory: no cough, no shortness of breath Gastrointestinal: no nausea/vomiting/diarrhea Musculoskeletal: no muscle/joint aches Skin: no rashes, no hyperemia, DM foot ulcer  (following with podiatry-needing surgical correction) Neurological: no tremors, no numbness, no tingling, no dizziness Psychiatric: no depression, no anxiety  Objective:     BP (!) 148/78 (BP Location: Left Arm, Patient Position: Sitting, Cuff Size: Large) Comment: Retake Manuel Cuff - patient states that he has not eaten  breakfast  Pulse 78   Ht 5\' 10"  (1.778 m)   Wt 179 lb (81.2 kg)   BMI 25.68 kg/m   Wt Readings from Last 3 Encounters:  08/30/22 179 lb (81.2 kg)  07/15/22 175 lb (79.4 kg)  05/30/22 171 lb 9.6 oz (77.8 kg)     BP Readings from Last 3 Encounters:  08/30/22 (!) 148/78  07/15/22 (!) 140/80  05/30/22 (!) 148/84     Physical Exam- Limited  Constitutional:  Body mass index is 25.68 kg/m. , not in acute distress, normal state of mind Eyes:  EOMI, no exophthalmos Neck: Supple Cardiovascular: RRR, no murmurs, rubs, or gallops, no edema Respiratory: Adequate breathing efforts, no crackles, rales, rhonchi, or wheezing Musculoskeletal: no gross deformities, strength intact in all four extremities, no gross restriction of joint movements Skin:  no rashes, no hyperemia Neurological: no tremor with outstretched hands   Diabetic Foot Exam - Simple   No data filed      CMP ( most recent) CMP     Component Value Date/Time   NA 140 07/15/2022 1522   NA 143 04/19/2022 1451   K 3.4 (L) 07/15/2022 1522   CL 114 (H) 07/15/2022 1522   CO2 19 (L) 07/15/2022 1522   GLUCOSE 105 (H) 07/15/2022 1522   BUN 28 (H) 07/15/2022 1522   BUN 40 (H) 04/19/2022 1451   CREATININE 1.52 (H) 07/15/2022 1522   CREATININE 1.62 (H) 10/19/2021 1511   CALCIUM 9.0 07/15/2022 1522   PROT 7.9 04/12/2022 0756   ALBUMIN 4.3 04/12/2022 0756   AST 20 04/12/2022 0756   ALT 23 04/12/2022 0756   ALKPHOS 76 04/12/2022 0756   BILITOT 0.6 04/12/2022 0756   GFR 45.95 (L) 04/12/2022 0756   EGFR 56 (L) 04/19/2022 1451   GFRNONAA 53 (L) 07/15/2022 1522     Diabetic Labs (most recent): Lab  Results  Component Value Date   HGBA1C 7.2 (A) 08/30/2022   HGBA1C 8.5 (H)  05/09/2022   HGBA1C 10.7 (H) 03/10/2022   MICROALBUR 5.0 (H) 07/18/2020   MICROALBUR 28.5 (H) 05/28/2019   MICROALBUR 87.0 (H) 06/16/2017     Lipid Panel ( most recent) Lipid Panel     Component Value Date/Time   CHOL 141 04/12/2022 0756   TRIG 53.0 04/12/2022 0756   HDL 45.90 04/12/2022 0756   CHOLHDL 3 04/12/2022 0756   VLDL 10.6 04/12/2022 0756   LDLCALC 85 04/12/2022 0756   LDLCALC 119 (H) 10/19/2021 1511      Lab Results  Component Value Date   TSH 1.280 04/09/2022   TSH 1.16 08/27/2019   TSH 0.82 05/09/2017   FREET4 0.97 08/27/2019   FREET4 1.1 05/09/2017           Assessment & Plan:   1) Type 2 diabetes mellitus with foot ulcer, with long-term current use of insulin (HCC)  He presents today, accompanied by his wife, for his consultation with no meter or logs to review.  He has been using CGM but had to take it off for MRI and has not reapplied.  His POCT A1c today is 7.2%, improving from last A1c of 8.5%.  He has been dealing with diabetic foot ulcer, needing surgery to correct.  He drinks mostly water (will have occasional zero sugar sprite), eats 2 meals per day with a snack between.  He does not engage in routine physical activity.  He is UTD on eye exam, sees podiatry routinely.  - Naseem Stephan has currently uncontrolled symptomatic type 2 DM since 57 years of age, with most recent A1c of 7.2 %.   -Recent labs reviewed.  - I had a long discussion with him about the progressive nature of diabetes and the pathology behind its complications. -his diabetes is complicated by CHF, CKD stage 3a, diabetic foot ulcer and he remains at a high risk for more acute and chronic complications which include CAD, CVA, CKD, retinopathy, and neuropathy. These are all discussed in detail with him.  The following Lifestyle Medicine recommendations according to American College of Lifestyle Medicine  [facility]) were discussed and offered to patient and he agrees to start the journey:  A. Whole Foods, Plant-based plate comprising of fruits and vegetables, plant-based proteins, whole-grain carbohydrates was discussed in detail with the patient.   A list for source of those nutrients were also provided to the patient.  Patient will use only water or unsweetened tea for hydration. B.  The need to stay away from risky substances including alcohol, smoking; obtaining 7 to 9 hours of restorative sleep, at least 150 minutes of moderate intensity exercise weekly, the importance of healthy social connections,  and stress reduction techniques were discussed. C.  A full color page of  Calorie density of various food groups per pound showing examples of each food groups was provided to the patient.  - I have counseled him on diet and weight management by adopting a carbohydrate restricted/protein rich diet. Patient is encouraged to switch to unprocessed or minimally processed complex starch and increased protein intake (animal or plant source), fruits, and vegetables. -  he is advised to stick to a routine mealtimes to eat 3 meals a day and avoid unnecessary snacks (to snack only to correct hypoglycemia).   - he acknowledges that there is a room for improvement in his food and drink choices. - Suggestion is made for him to avoid simple carbohydrates from his diet including Cakes, Sweet Desserts, Ice Cream, Soda (diet and regular), Sweet Tea,  Candies, Chips, Cookies, Store Bought Juices, Alcohol in Excess of 1-2 drinks a day, Artificial Sweeteners, Coffee Creamer, and "Sugar-free" Products. This will help patient to have more stable blood glucose profile and potentially avoid unintended weight gain.  - I have approached him with the following individualized plan to manage his diabetes and patient agrees:    -he is encouraged to start monitoring glucose 4 times daily, before meals and before bed, to log their  readings on the clinic sheets provided, and bring them to review at follow up appointment in 2 weeks.  - he is warned not to take insulin without proper monitoring per orders. - Adjustment parameters are given to him for hypo and hyperglycemia in writing. - he is encouraged to call clinic for blood glucose levels less than 70 or above 300 mg /dl. - he is advised to continue Tresiba 15 units SQ nightly and Farxiga 10 mg po daily, therapeutically suitable for patient .  - he is not a candidate for Metformin due to concurrent renal insufficiency.  - he is not an ideal candidate for incretin therapy due to body habitus.  - Specific targets for  A1c; LDL, HDL, and Triglycerides were discussed with the patient.  2) Blood Pressure /Hypertension:  his blood pressure is controlled to target.   he is advised to continue his current medications including Coreg 25 mg po twice daily, Lasix 20 mg po daily as needed for fluid, Bidil 20-37.5 mg 1/2 tab po twice daily, Entresto 97-103 mg po twice daily, and Aldactone 25 mg po daily.    3) Lipids/Hyperlipidemia:    Review of his recent lipid panel from 04/12/22 showed controlled LDL at 85 .  he is advised to continue Lipitor 80 mg daily at bedtime.  Side effects and precautions discussed with him.  4)  Weight/Diet:  his Body mass index is 25.68 kg/m.  -  he is NOT a candidate for weight loss.  Exercise, and detailed carbohydrates information provided  -  detailed on discharge instructions.  5) Chronic Care/Health Maintenance: -he is on ACEI/ARB and Statin medications and is encouraged to initiate and continue to follow up with Ophthalmology, Dentist, Podiatrist at least yearly or according to recommendations, and advised to stay away from smoking. I have recommended yearly flu vaccine and pneumonia vaccine at least every 5 years; moderate intensity exercise for up to 150 minutes weekly; and sleep for at least 7 hours a day.  - he is advised to maintain close  follow up with Sharlene Dory, DO for primary care needs, as well as his other providers for optimal and coordinated care.   - Time spent in this patient care: 60 min, of which > 50% was spent in counseling him about his diabetes and the rest reviewing his blood glucose logs, discussing his hypoglycemia and hyperglycemia episodes, reviewing his current and previous labs/studies (including abstraction from other facilities) and medications doses and developing a long term treatment plan based on the latest standards of care/guidelines; and documenting his care.    Please refer to Patient Instructions for Blood Glucose Monitoring and Insulin/Medications Dosing Guide" in media tab for additional information. Please also refer to "Patient Self Inventory" in the Media tab for reviewed elements of pertinent patient history.  Demitrios Dittoe participated in the discussions, expressed understanding, and voiced agreement with the above plans.  All questions were answered to his satisfaction. he is encouraged to contact clinic should he have any questions or concerns prior to his return  visit.     Follow up plan: - Return in about 3 months (around 11/30/2022) for Diabetes F/U with A1c in office, No previsit labs, Bring meter and logs.    Ronny Bacon, [facility] [address] [place], [place] [address] Phone: [phone] Fax: [phone]  08/30/2022, 11:38 AM

## 2022-09-02 ENCOUNTER — Other Ambulatory Visit (HOSPITAL_COMMUNITY): Payer: Self-pay

## 2022-09-25 ENCOUNTER — Other Ambulatory Visit (HOSPITAL_COMMUNITY): Payer: Self-pay

## 2022-09-27 ENCOUNTER — Other Ambulatory Visit (HOSPITAL_COMMUNITY): Payer: Self-pay

## 2022-09-30 ENCOUNTER — Other Ambulatory Visit (HOSPITAL_COMMUNITY): Payer: Self-pay

## 2022-09-30 ENCOUNTER — Ambulatory Visit: Payer: Commercial Managed Care - PPO | Admitting: Podiatry

## 2022-09-30 DIAGNOSIS — E0843 Diabetes mellitus due to underlying condition with diabetic autonomic (poly)neuropathy: Secondary | ICD-10-CM

## 2022-09-30 DIAGNOSIS — L97512 Non-pressure chronic ulcer of other part of right foot with fat layer exposed: Secondary | ICD-10-CM | POA: Diagnosis not present

## 2022-09-30 MED ORDER — CIPROFLOXACIN HCL 500 MG PO TABS
500.0000 mg | ORAL_TABLET | Freq: Two times a day (BID) | ORAL | 0 refills | Status: AC
  Filled 2022-09-30: qty 20, 10d supply, fill #0

## 2022-10-02 ENCOUNTER — Ambulatory Visit: Payer: Commercial Managed Care - PPO | Admitting: Podiatry

## 2022-10-06 NOTE — Progress Notes (Signed)
Chief Complaint  Patient presents with   Diabetic Ulcer    Patient came in today for diabetic ulcer follow-up, right hallux, patient is having more drainage, "green"     Subjective:  Patient presents today status post excision of with IPJ sesamoid with debridement of ulcer and bone biopsy right great toe.  DOS: 05/10/2022.  Patient states continues to have ulcer development to the plantar aspect of the IPJ of the toe. Past Medical History:  Diagnosis Date   Asthma    CAD in native artery    CHF (congestive heart failure) (HCC)    Chronic HFrEF (heart failure with reduced ejection fraction) (HCC)    CKD (chronic kidney disease) stage 2, GFR 60-89 ml/min    Diabetic foot ulcer (HCC)    History of chicken pox    History of DVT (deep vein thrombosis)    s/p trauma of R leg -- 2014. No other history of DVT   History of kidney stones    Hypertension    Lazy eye of left side    NICM (nonischemic cardiomyopathy) (HCC)    Pneumonia    Uncontrolled diabetes mellitus with hyperglycemia [facility])     Past Surgical History:  Procedure Laterality Date   AMPUTATION TOE Left 05/16/2021   Procedure: AMPUTATION LEFT GREAT  TOE;  Surgeon: Vivi Barrack, DPM;  [facility];  Service: Podiatry;  Laterality: Left;   BONE BIOPSY Right 05/10/2022   Procedure: BONE BIOPSY;  Surgeon: Felecia Shelling, DPM;  Location: [facility];  Service: Podiatry;  Laterality: Right;   EXOSTECTECTOMY TOE Right 05/10/2022   Procedure: EXOSTECTECTOMY TOE;  Surgeon: Felecia Shelling, DPM;  [facility]: [facility];  Service: Podiatry;  Laterality: Right;   EYE SURGERY Right    cataract surgery  w/ IOL   RIGHT/LEFT HEART CATH AND CORONARY ANGIOGRAPHY N/A 03/11/2022   Procedure: RIGHT/LEFT HEART CATH AND CORONARY ANGIOGRAPHY;  Surgeon: Yvonne Kendall, MD;  [facility];  Service: Cardiovascular;  Laterality: N/A;    Allergies  Allergen Reactions   Bidil [Isosorb Dinitrate-Hydralazine] Other (See Comments)     Dropped BP - but has tolerated lower dose   Zofran [Ondansetron Hcl] Nausea Only    Abdominal pain    Objective/Physical Exam There continues to be maceration with hyperkeratotic callus tissue along the plantar aspect of the right great toe.  There is some slight maceration as well around this area.  Radiographic Exam RT foot 08/21/2022:  Complete dorsal displacement of the IPJ noted to the great toe on lateral view creating retrograde pressure to the IPJ.  No obvious indication of osseous erosions or pathologic fracture.  MR TOES RIGHT WO CONTRAST 04/28/2022: IMPRESSION: 1. Apparent soft tissue ulceration along the plantar aspect of the great toe at the level of the interphalangeal joint with surrounding soft tissue edema, but no focal fluid collection. 2. T2 hyperintensity throughout the distal 1st phalanx with mild associated decreased T1 marrow signal, suspicious for early osteomyelitis. No cortical destruction identified. 3. Probable rupture of the flexor hallucis longus tendon at the level of the 1st metatarsal.  Assessment: 1. s/p IPJ sesamoidectomy with debridement of ulcer and bone biopsy right great toe. DOS: 05/10/2022 2.  Severe dorsiflexion of the IPJ with retrograde plantar pressure  -Patient evaluated.  Ultimately I do believe the patient would benefit from surgery, IPJ arthrodesis, but recommend this when there is no infection to the foot. - Medically necessary excisional debridement including subcutaneous tissue was performed today using  a tissue nipper.  Medically necessary excisional debridement including subcutaneous tissue was performed today using a tissue nipper.  Excisional debridement of the necrotic nonviable tissue down to healthier bleeding viable tissue was performed with postdebridement measurement same as pre- -continue Betadine wet-to-dry dressings daily -Continue diabetic shoes with custom molded insoles -patient has an appointment with endocrinology for  updated A1c and diabetes management.  I am concerned that if the ulcer continues to the plantar aspect of the IPJ this will lead to osteomyelitis and he will eventually lose the great toe.  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  Will discuss further after appointment with endocrinology and to see what his updated A1c is - Due to the increased maceration and ulcer prescription for ciprofloxacin 500 mg BID based on prior cultures 05/10/2022 -Return to clinic 4 weeks   Felecia Shelling, DPM [facility]  Dr. Felecia Shelling, DPM    [address] [place], [place] [address]                Office [phone]  Fax [phone]

## 2022-10-18 ENCOUNTER — Other Ambulatory Visit (HOSPITAL_COMMUNITY): Payer: Self-pay

## 2022-10-28 ENCOUNTER — Ambulatory Visit (INDEPENDENT_AMBULATORY_CARE_PROVIDER_SITE_OTHER): Payer: Commercial Managed Care - PPO | Admitting: Podiatry

## 2022-10-28 ENCOUNTER — Ambulatory Visit (INDEPENDENT_AMBULATORY_CARE_PROVIDER_SITE_OTHER): Payer: Commercial Managed Care - PPO

## 2022-10-28 DIAGNOSIS — M2031 Hallux varus (acquired), right foot: Secondary | ICD-10-CM | POA: Diagnosis not present

## 2022-10-28 DIAGNOSIS — L97512 Non-pressure chronic ulcer of other part of right foot with fat layer exposed: Secondary | ICD-10-CM

## 2022-10-28 NOTE — Progress Notes (Signed)
Chief Complaint  Patient presents with   Ulcer of great toe, right, with fat layer exposed     Pt stated that it is doing ok     Subjective:  Patient presents today status post excision of with IPJ sesamoid with debridement of ulcer and bone biopsy right great toe.  DOS: 05/10/2022.  Patient states continues to have ulcer development to the plantar aspect of the IPJ of the toe.  Patient states that the wound is very stable and he is doing well.  Recent A1c was 7.2  Past Medical History:  Diagnosis Date   Asthma    CAD in native artery    CHF (congestive heart failure) (HCC)    Chronic HFrEF (heart failure with reduced ejection fraction) (HCC)    CKD (chronic kidney disease) stage 2, GFR 60-89 ml/min    Diabetic foot ulcer (HCC)    History of chicken pox    History of DVT (deep vein thrombosis)    s/p trauma of R leg -- 2014. No other history of DVT   History of kidney stones    Hypertension    Lazy eye of left side    NICM (nonischemic cardiomyopathy) (HCC)    Pneumonia    Uncontrolled diabetes mellitus with hyperglycemia [facility])     Past Surgical History:  Procedure Laterality Date   AMPUTATION TOE Left 05/16/2021   Procedure: AMPUTATION LEFT GREAT  TOE;  Surgeon: Vivi Barrack, DPM;  [facility];  Service: Podiatry;  Laterality: Left;   BONE BIOPSY Right 05/10/2022   Procedure: BONE BIOPSY;  Surgeon: Felecia Shelling, DPM;  [facility];  Service: Podiatry;  Laterality: Right;   EXOSTECTECTOMY TOE Right 05/10/2022   Procedure: EXOSTECTECTOMY TOE;  Surgeon: Felecia Shelling, DPM;  [facility];  Service: Podiatry;  Laterality: Right;   EYE SURGERY Right    cataract surgery  w/ IOL   RIGHT/LEFT HEART CATH AND CORONARY ANGIOGRAPHY N/A 03/11/2022   Procedure: RIGHT/LEFT HEART CATH AND CORONARY ANGIOGRAPHY;  Surgeon: Yvonne Kendall, MD;  [facility];  Service: Cardiovascular;  Laterality: N/A;    Allergies  Allergen Reactions   Bidil [Isosorb  Dinitrate-Hydralazine] Other (See Comments)    Dropped BP - but has tolerated lower dose   Zofran [Ondansetron Hcl] Nausea Only    Abdominal pain    Objective/Physical Exam Overall the wound appears significantly improved.  There continues to be hyperkeratotic callus tissue around the area but there is no drainage today.  It appears very stable and does not probe to bone or joint capsule. The IPJ is dorsiflexed to the great toe secondary to loss of integrity of the flexor hallucis longus tendon  Radiographic Exam RT foot 08/21/2022:  Complete dorsal displacement of the IPJ noted to the great toe on lateral view creating retrograde pressure to the IPJ.  No obvious indication of osseous erosions or pathologic fracture.  MR TOES RIGHT WO CONTRAST 04/28/2022: IMPRESSION: 1. Apparent soft tissue ulceration along the plantar aspect of the great toe at the level of the interphalangeal joint with surrounding soft tissue edema, but no focal fluid collection. 2. T2 hyperintensity throughout the distal 1st phalanx with mild associated decreased T1 marrow signal, suspicious for early osteomyelitis. No cortical destruction identified. 3. Probable rupture of the flexor hallucis longus tendon at the level of the 1st metatarsal.  Assessment: 1. s/p IPJ sesamoidectomy with debridement of ulcer and bone biopsy right great toe. DOS: 05/10/2022 2.  Severe dorsiflexion  of the IPJ with retrograde plantar pressure  -Patient evaluated.  Debridement of the hyperkeratotic callus tissue was performed today using a tissue nipper. -Patient's A1c is significantly improved to 7.2 acceptable range for surgery.  Currently there is no indication of infection or osteomyelitis of the toe.  I do believe it is appropriate at this time to consider IPJ arthrodesis.  We have discussed this on multiple occasions but you the patient's A1c levels were elevated or the soft tissue around the ulcer was not healthy to proceed with  hardware implant.  I do believe that proceeding with surgery is indicated and appropriate at this time since conservative treatment has failed to alleviate pressure from the wound and promote healing.  I do believe that fusing the toe in a rectus alignment will alleviate pressure from the IPJ and allow the wound to heal.  -Risk benefits advantages and disadvantages of the procedure were explained in detail to the patient.  Understands that we will he will be minimal WBAT in the cam boot for about 6 weeks postoperatively.  All patient questions were answered.  No guarantees were expressed or implied. -Authorization for surgery was initiated today.  Surgery will consist of right hallux IPJ arthrodesis -Return to clinic 1 week postop  Felecia Shelling, DPM [facility]  Dr. Felecia Shelling, DPM    [address] [place], [place] [address]                Office [phone]  Fax [phone]

## 2022-11-01 ENCOUNTER — Telehealth: Payer: Self-pay | Admitting: Urology

## 2022-11-01 NOTE — Telephone Encounter (Signed)
DOS - 11/21/22  HALLUX MPJ FUSION RIGHT --- 24401  [employer] EFFECTIVE DATE - 03/25/22  PER [employer]'S AUTOMATED SYSTEM FOR CPT CODE 02725 NO PRIOR AUTH IS REQUIRED.   CALL REF # [id number]

## 2022-11-02 ENCOUNTER — Other Ambulatory Visit: Payer: Self-pay | Admitting: Physician Assistant

## 2022-11-05 ENCOUNTER — Other Ambulatory Visit (HOSPITAL_COMMUNITY): Payer: Self-pay

## 2022-11-05 MED ORDER — ENTRESTO 97-103 MG PO TABS
1.0000 | ORAL_TABLET | Freq: Two times a day (BID) | ORAL | 0 refills | Status: DC
  Filled 2022-11-05: qty 180, 90d supply, fill #0

## 2022-11-05 NOTE — Telephone Encounter (Signed)
This is a CHF pt last seen in April 2024. Please address

## 2022-11-19 DIAGNOSIS — M25579 Pain in unspecified ankle and joints of unspecified foot: Secondary | ICD-10-CM

## 2022-11-21 ENCOUNTER — Other Ambulatory Visit: Payer: Self-pay | Admitting: Podiatry

## 2022-11-21 ENCOUNTER — Other Ambulatory Visit: Payer: Self-pay

## 2022-11-21 ENCOUNTER — Other Ambulatory Visit (HOSPITAL_COMMUNITY): Payer: Self-pay

## 2022-11-21 DIAGNOSIS — M205X1 Other deformities of toe(s) (acquired), right foot: Secondary | ICD-10-CM | POA: Diagnosis not present

## 2022-11-21 DIAGNOSIS — M2031 Hallux varus (acquired), right foot: Secondary | ICD-10-CM | POA: Diagnosis not present

## 2022-11-21 MED ORDER — HYDROCODONE-ACETAMINOPHEN 10-325 MG PO TABS
1.0000 | ORAL_TABLET | ORAL | 0 refills | Status: AC | PRN
  Filled 2022-11-21: qty 30, 5d supply, fill #0

## 2022-11-21 MED ORDER — CLINDAMYCIN HCL 300 MG PO CAPS
300.0000 mg | ORAL_CAPSULE | Freq: Three times a day (TID) | ORAL | 0 refills | Status: DC
  Filled 2022-11-21: qty 30, 10d supply, fill #0

## 2022-11-21 NOTE — Progress Notes (Signed)
PRN postop 

## 2022-11-27 ENCOUNTER — Ambulatory Visit (INDEPENDENT_AMBULATORY_CARE_PROVIDER_SITE_OTHER): Payer: Commercial Managed Care - PPO | Admitting: Podiatry

## 2022-11-27 ENCOUNTER — Encounter: Payer: Self-pay | Admitting: Podiatry

## 2022-11-27 ENCOUNTER — Ambulatory Visit (INDEPENDENT_AMBULATORY_CARE_PROVIDER_SITE_OTHER): Payer: Commercial Managed Care - PPO

## 2022-11-27 DIAGNOSIS — Z9889 Other specified postprocedural states: Secondary | ICD-10-CM

## 2022-11-27 DIAGNOSIS — M2031 Hallux varus (acquired), right foot: Secondary | ICD-10-CM | POA: Diagnosis not present

## 2022-11-27 NOTE — Progress Notes (Signed)
   Chief Complaint  Patient presents with   Routine Post Op    POV # 1 DOS 11/21/22 --- RIGHT GREAT TOE ARTHRODESIS    Subjective:  Patient presents today status post right great toe IPJ arthrodesis.  DOS: 11/21/2022.  This patient doing well.  WBAT in the cam boot.  No new complaints  Past Medical History:  Diagnosis Date   Asthma    CAD in native artery    CHF (congestive heart failure) (HCC)    Chronic HFrEF (heart failure with reduced ejection fraction) (HCC)    CKD (chronic kidney disease) stage 2, GFR 60-89 ml/min    Diabetic foot ulcer (HCC)    History of chicken pox    History of DVT (deep vein thrombosis)    s/p trauma of R leg -- 2014. No other history of DVT   History of kidney stones    Hypertension    Lazy eye of left side    NICM (nonischemic cardiomyopathy) (HCC)    Pneumonia    Uncontrolled diabetes mellitus with hyperglycemia [facility])     Past Surgical History:  Procedure Laterality Date   AMPUTATION TOE Left 05/16/2021   Procedure: AMPUTATION LEFT GREAT  TOE;  Surgeon: Vivi Barrack, DPM;  [facility];  Service: Podiatry;  Laterality: Left;   BONE BIOPSY Right 05/10/2022   Procedure: BONE BIOPSY;  Surgeon: Felecia Shelling, DPM;  [facility];  Service: Podiatry;  Laterality: Right;   EXOSTECTECTOMY TOE Right 05/10/2022   Procedure: EXOSTECTECTOMY TOE;  Surgeon: Felecia Shelling, DPM;  [facility];  Service: Podiatry;  Laterality: Right;   EYE SURGERY Right    cataract surgery  w/ IOL   RIGHT/LEFT HEART CATH AND CORONARY ANGIOGRAPHY N/A 03/11/2022   Procedure: RIGHT/LEFT HEART CATH AND CORONARY ANGIOGRAPHY;  Surgeon: Yvonne Kendall, MD;  [facility];  Service: Cardiovascular;  Laterality: N/A;    Allergies  Allergen Reactions   Bidil [Isosorb Dinitrate-Hydralazine] Other (See Comments)    Dropped BP - but has tolerated lower dose   Zofran [Ondansetron Hcl] Nausea Only    Abdominal pain    Objective/Physical  Exam Neurovascular status intact.  Incision well coapted with sutures intact. No sign of infectious process noted. No dehiscence. No active bleeding noted.  No edema or erythema noted.  The wound to the plantar aspect of the great toe appears to be healing nicely  Radiographic Exam RT foot 11/27/2022:  Rectus alignment of the great toe.  The orthopedic screw does appear to be plantar to the distal phalanx.  There must be some stability at the IPJ and it appears that the screw does cross the base of the proximal phalanx stabilizing the joint.  Assessment: 1. s/p IPJ arthrodesis right great toe. DOS: 11/27/2022   Plan of Care:  -Patient was evaluated. X-rays reviewed -   Felecia Shelling, DPM [facility]  Dr. Felecia Shelling, DPM    [address] [place], [place] [address]                Office [phone]  Fax [phone]

## 2022-12-04 ENCOUNTER — Ambulatory Visit (INDEPENDENT_AMBULATORY_CARE_PROVIDER_SITE_OTHER): Payer: Commercial Managed Care - PPO | Admitting: Podiatry

## 2022-12-04 DIAGNOSIS — L97512 Non-pressure chronic ulcer of other part of right foot with fat layer exposed: Secondary | ICD-10-CM

## 2022-12-04 DIAGNOSIS — B351 Tinea unguium: Secondary | ICD-10-CM | POA: Diagnosis not present

## 2022-12-04 DIAGNOSIS — M79675 Pain in left toe(s): Secondary | ICD-10-CM

## 2022-12-04 DIAGNOSIS — M79674 Pain in right toe(s): Secondary | ICD-10-CM

## 2022-12-04 NOTE — Progress Notes (Signed)
Chief Complaint  Patient presents with   Routine Post Op     POV # 2  DOS 11/21/22 --- RIGHT GREAT TOE ARTHRODESIS, PT STATED THAT HE IS GETTING BETTER.      Subjective:  Patient presents today status post right great toe IPJ arthrodesis.  DOS: 11/21/2022.  This patient doing well.  WBAT in the cam boot.  No new complaints  Past Medical History:  Diagnosis Date   Asthma    CAD in native artery    CHF (congestive heart failure) (HCC)    Chronic HFrEF (heart failure with reduced ejection fraction) (HCC)    CKD (chronic kidney disease) stage 2, GFR 60-89 ml/min    Diabetic foot ulcer (HCC)    History of chicken pox    History of DVT (deep vein thrombosis)    s/p trauma of R leg -- 2014. No other history of DVT   History of kidney stones    Hypertension    Lazy eye of left side    NICM (nonischemic cardiomyopathy) (HCC)    Pneumonia    Uncontrolled diabetes mellitus with hyperglycemia [facility])     Past Surgical History:  Procedure Laterality Date   AMPUTATION TOE Left 05/16/2021   Procedure: AMPUTATION LEFT GREAT  TOE;  Surgeon: Vivi Barrack, DPM;  [facility];  Service: Podiatry;  Laterality: Left;   BONE BIOPSY Right 05/10/2022   Procedure: BONE BIOPSY;  Surgeon: Felecia Shelling, DPM;  [facility];  Service: Podiatry;  Laterality: Right;   EXOSTECTECTOMY TOE Right 05/10/2022   Procedure: EXOSTECTECTOMY TOE;  Surgeon: Felecia Shelling, DPM;  [facility];  Service: Podiatry;  Laterality: Right;   EYE SURGERY Right    cataract surgery  w/ IOL   RIGHT/LEFT HEART CATH AND CORONARY ANGIOGRAPHY N/A 03/11/2022   Procedure: RIGHT/LEFT HEART CATH AND CORONARY ANGIOGRAPHY;  Surgeon: Yvonne Kendall, MD;  [facility];  Service: Cardiovascular;  Laterality: N/A;    Allergies  Allergen Reactions   Bidil [Isosorb Dinitrate-Hydralazine] Other (See Comments)    Dropped BP - but has tolerated lower dose   Zofran [Ondansetron Hcl] Nausea Only    Abdominal  pain    Objective/Physical Exam Neurovascular status intact.  Incision well coapted with sutures intact. No sign of infectious process noted. No dehiscence. No active bleeding noted.  No edema or erythema noted.  The wound to the plantar aspect of the great toe appears to be healing nicely Hyperkeratotic dystrophic nails noted 1-5 bilateral  Radiographic Exam RT foot 11/27/2022:  Rectus alignment of the great toe.  The orthopedic screw does appear to be plantar to the distal phalanx.  There must be some stability at the IPJ and it appears that the screw does cross the base of the proximal phalanx stabilizing the joint.  Assessment: 1. s/p IPJ arthrodesis right great toe. DOS: 11/27/2022 2.  Pain due to onychomycosis of toenails both 3.  Diabetes mellitus with peripheral polyneuropathy  Plan of Care:  -Patient was evaluated.  -Sutures removed -Mechanical debridement of nails 1-5 bilateral was performed using a nail nipper without incident or bleeding.  Smoothed with a rotary bur. -Continue WBAT cam boot -Return to clinic 2 weeks follow-up x-ray  Felecia Shelling, DPM [facility]  Dr. Felecia Shelling, DPM    [address]  [place], [place] [address]                Office [phone]  Fax [phone]

## 2022-12-06 ENCOUNTER — Encounter: Payer: Self-pay | Admitting: Nurse Practitioner

## 2022-12-06 ENCOUNTER — Ambulatory Visit: Payer: Commercial Managed Care - PPO | Admitting: Nurse Practitioner

## 2022-12-06 ENCOUNTER — Other Ambulatory Visit (HOSPITAL_COMMUNITY): Payer: Self-pay

## 2022-12-06 ENCOUNTER — Other Ambulatory Visit: Payer: Self-pay

## 2022-12-06 VITALS — BP 138/80 | HR 73 | Ht 70.0 in | Wt 180.4 lb

## 2022-12-06 DIAGNOSIS — E1165 Type 2 diabetes mellitus with hyperglycemia: Secondary | ICD-10-CM | POA: Diagnosis not present

## 2022-12-06 DIAGNOSIS — Z794 Long term (current) use of insulin: Secondary | ICD-10-CM | POA: Diagnosis not present

## 2022-12-06 DIAGNOSIS — Z7984 Long term (current) use of oral hypoglycemic drugs: Secondary | ICD-10-CM

## 2022-12-06 LAB — POCT GLYCOSYLATED HEMOGLOBIN (HGB A1C): Hemoglobin A1C: 7.2 % — AB (ref 4.0–5.6)

## 2022-12-06 MED ORDER — TRESIBA FLEXTOUCH 100 UNIT/ML ~~LOC~~ SOPN
18.0000 [IU] | PEN_INJECTOR | Freq: Every day | SUBCUTANEOUS | 3 refills | Status: DC
Start: 2022-12-06 — End: 2023-11-10
  Filled 2022-12-06 – 2022-12-09 (×4): qty 15, 83d supply, fill #0
  Filled 2023-02-17 – 2023-02-26 (×2): qty 15, 83d supply, fill #1
  Filled 2023-06-10: qty 15, 83d supply, fill #2

## 2022-12-06 NOTE — Progress Notes (Signed)
Endocrinology Follow UpNote       12/06/2022, 11:50 AM   Subjective:    Patient ID: John [name], male    DOB: 1965/08/25.  John [name] is being seen in follow up after being seen in consultation for management of currently uncontrolled symptomatic diabetes requested by  Sharlene Dory, DO.  He is a [employer], works in [employer].   Past Medical History:  Diagnosis Date   Asthma    CAD in native artery    CHF (congestive heart failure) (HCC)    Chronic HFrEF (heart failure with reduced ejection fraction) (HCC)    CKD (chronic kidney disease) stage 2, GFR 60-89 ml/min    Diabetic foot ulcer (HCC)    History of chicken pox    History of DVT (deep vein thrombosis)    s/p trauma of R leg -- 2014. No other history of DVT   History of kidney stones    Hypertension    Lazy eye of left side    NICM (nonischemic cardiomyopathy) (HCC)    Pneumonia    Uncontrolled diabetes mellitus with hyperglycemia [facility])     Past Surgical History:  Procedure Laterality Date   AMPUTATION TOE Left 05/16/2021   Procedure: AMPUTATION LEFT GREAT  TOE;  Surgeon: Vivi Barrack, DPM;  [facility] OR;  Service: Podiatry;  Laterality: Left;   BONE BIOPSY Right 05/10/2022   Procedure: BONE BIOPSY;  Surgeon: Felecia Shelling, DPM;  Location: [facility];  Service: Podiatry;  Laterality: Right;   EXOSTECTECTOMY TOE Right 05/10/2022   Procedure: EXOSTECTECTOMY TOE;  Surgeon: Felecia Shelling, DPM;  Location: [facility];  Service: Podiatry;  Laterality: Right;   EYE SURGERY Right    cataract surgery  w/ IOL   RIGHT/LEFT HEART CATH AND CORONARY ANGIOGRAPHY N/A 03/11/2022   Procedure: RIGHT/LEFT HEART CATH AND CORONARY ANGIOGRAPHY;  Surgeon: Yvonne Kendall, MD;  [facility];  Service: Cardiovascular;  Laterality: N/A;    Social History   Socioeconomic History   Marital status: Married    Spouse name: [name]    Number of children: 1   Years of education: Not on file   Highest education level: High school graduate  Occupational History   Occupation: Financial risk analyst    Comment: [employer]   Tobacco Use   Smoking status: Never   Smokeless tobacco: Never  Vaping Use   Vaping status: Never Used  Substance and Sexual Activity   Alcohol use: No   Drug use: No   Sexual activity: Yes    Partners: Female  Other Topics Concern   Not on file  Social History Narrative   Not on file   Social Determinants of Health   Financial Resource Strain: Medium Risk (03/12/2022)   Overall Financial Resource Strain (CARDIA)    Difficulty of Paying Living Expenses: Somewhat hard  Food Insecurity: No Food Insecurity (03/09/2022)   Hunger Vital Sign    Worried About Running Out of Food in the Last Year: Never true    Ran Out of Food in the Last Year: Never true  Transportation Needs: No Transportation Needs (03/12/2022)   PRAPARE - Transportation    Lack  of Transportation (Medical): No    Lack of Transportation (Non-Medical): No  Physical Activity: Not on file  Stress: Not on file  Social Connections: Not on file    Family History  Problem Relation Age of Onset   Heart disease Mother 49   Arthritis Mother    Heart disease Father 20   Stroke Father    Healthy Brother    Heart disease Maternal Grandmother    Diabetes Maternal Uncle     Outpatient Encounter Medications as of 12/06/2022  Medication Sig   albuterol (VENTOLIN HFA) 108 (90 Base) MCG/ACT inhaler Inhale 2 puffs into the lungs every 4 (four) hours as needed for wheezing or shortness of breath.   aspirin 81 MG chewable tablet Chew 81 mg by mouth daily.   atorvastatin (LIPITOR) 80 MG tablet Take 1 tablet (80 mg total) by mouth daily.   carvedilol (COREG) 25 MG tablet Take 1 tablet (25 mg total) by mouth 2 (two) times daily with a meal.   clindamycin (CLEOCIN) 300 MG capsule Take 1 capsule (300 mg total) by mouth 3 (three) times daily.    Continuous Glucose Sensor (FREESTYLE LIBRE 3 SENSOR) MISC Place 1 sensor on the skin every 14 days. Use to check glucose continuously   dapagliflozin propanediol (FARXIGA) 10 MG TABS tablet Take 1 tablet (10 mg total) by mouth daily before breakfast.   furosemide (LASIX) 20 MG tablet Take 1 tablet (20 mg total) by mouth daily as needed for edema or fluid.   glucose blood test strip Use as instructed, Check blood sugars twice daily   Insulin Pen Needle (NOVOFINE PLUS PEN NEEDLE) 32G X 4 MM MISC Use daily to insulin   isosorbide-hydrALAZINE (BIDIL) 20-37.5 MG tablet Take 1/2 tablet by mouth 2 (two) times daily.   sacubitril-valsartan (ENTRESTO) 97-103 MG Take 1 tablet by mouth 2 (two) times daily. NEEDS FOLLOW UP APPOINTMENT FOR MORE REFILLS   spironolactone (ALDACTONE) 25 MG tablet Take 1 tablet (25 mg total) by mouth daily.   [DISCONTINUED] insulin degludec (TRESIBA FLEXTOUCH) 100 UNIT/ML FlexTouch Pen Inject 15 Units into the skin at bedtime.   insulin degludec (TRESIBA FLEXTOUCH) 100 UNIT/ML FlexTouch Pen Inject 18 Units into the skin at bedtime.   No facility-administered encounter medications on file as of 12/06/2022.    ALLERGIES: Allergies  Allergen Reactions   Bidil [Isosorb Dinitrate-Hydralazine] Other (See Comments)    Dropped BP - but has tolerated lower dose   Zofran [Ondansetron Hcl] Nausea Only    Abdominal pain    VACCINATION STATUS: Immunization History  Administered Date(s) Administered   Influenza,inj,Quad PF,6+ Mos 01/13/2019, 01/04/2020   Influenza-Unspecified 11/24/2018, 11/28/2021   PFIZER(Purple Top)SARS-COV-2 Vaccination 06/03/2019, 06/24/2019, 01/10/2020, 01/04/2022   Pneumococcal Polysaccharide-23 10/18/2015   Tdap 06/25/2012    Diabetes He presents for his follow-up diabetic visit. He has type 2 diabetes mellitus. Onset time: Diagnosed at approx age of 60. His disease course has been stable. There are no hypoglycemic associated symptoms. There are no  diabetic associated symptoms. There are no hypoglycemic complications. Diabetic complications include heart disease (CHF), nephropathy and PVD. Risk factors for coronary artery disease include diabetes mellitus, male sex, hypertension, dyslipidemia and family history. Current diabetic treatment includes oral agent (monotherapy) and insulin injections. He is compliant with treatment most of the time. His weight is increasing steadily. He is following a generally healthy diet. Meal planning includes avoidance of concentrated sweets. He has not had a previous visit with a dietitian. He participates in exercise intermittently. (He presents  today, accompanied by his wife, with his CGM showing at goal glycemic profile overall.  His POCT A1c today is 7.2%, unchanged from previous visit.  Analysis of his CGM shows TIR 77%, TAR 23%, TBR 0% with a GMI of 7.1%.  He did have right foot surgery since last visit and is healing nicely.  He continues to work on diet, does still consume some SF products such as yogurt, jello, puddings.  ) An ACE inhibitor/angiotensin II receptor blocker is being taken. He sees a podiatrist.Eye exam is current.     Review of systems  Constitutional: + slowly increasing body weight, current Body mass index is 25.88 kg/m., no fatigue, no subjective hyperthermia, no subjective hypothermia Eyes: no blurry vision, no xerophthalmia ENT: no sore throat, no nodules palpated in throat, no dysphagia/odynophagia, no hoarseness Cardiovascular: no chest pain, no shortness of breath, no palpitations, no leg swelling Respiratory: no cough, no shortness of breath Gastrointestinal: no nausea/vomiting/diarrhea Musculoskeletal: no muscle/joint aches, wearing ortho boot to right foot (recent foot surgery) Skin: no rashes, no hyperemia, Neurological: no tremors, no numbness, no tingling, no dizziness Psychiatric: no depression, no anxiety  Objective:     BP 138/80 (BP Location: Right Arm, Patient  Position: Sitting, Cuff Size: Large)   Pulse 73   Ht 5\' 10"  (1.778 m)   Wt 180 lb 6.4 oz (81.8 kg)   BMI 25.88 kg/m   Wt Readings from Last 3 Encounters:  12/06/22 180 lb 6.4 oz (81.8 kg)  08/30/22 179 lb (81.2 kg)  07/15/22 175 lb (79.4 kg)     BP Readings from Last 3 Encounters:  12/06/22 138/80  08/30/22 (!) 148/78  07/15/22 (!) 140/80     Physical Exam- Limited  Constitutional:  Body mass index is 25.88 kg/m. , not in acute distress, normal state of mind Eyes:  EOMI, no exophthalmos Musculoskeletal: no gross deformities, strength intact in all four extremities, no gross restriction of joint movements, right foot in ortho boot from recent surgery Skin:  no rashes, no hyperemia Neurological: no tremor with outstretched hands   Diabetic Foot Exam - Simple   No data filed      CMP ( most recent) CMP     Component Value Date/Time   NA 140 07/15/2022 1522   NA 143 04/19/2022 1451   K 3.4 (L) 07/15/2022 1522   CL 114 (H) 07/15/2022 1522   CO2 19 (L) 07/15/2022 1522   GLUCOSE 105 (H) 07/15/2022 1522   John 28 (H) 07/15/2022 1522   John 40 (H) 04/19/2022 1451   CREATININE 1.52 (H) 07/15/2022 1522   CREATININE 1.62 (H) 10/19/2021 1511   CALCIUM 9.0 07/15/2022 1522   PROT 7.9 04/12/2022 0756   ALBUMIN 4.3 04/12/2022 0756   AST 20 04/12/2022 0756   ALT 23 04/12/2022 0756   ALKPHOS 76 04/12/2022 0756   BILITOT 0.6 04/12/2022 0756   GFR 45.95 (L) 04/12/2022 0756   EGFR 56 (L) 04/19/2022 1451   GFRNONAA 53 (L) 07/15/2022 1522     Diabetic Labs (most recent): Lab Results  Component Value Date   HGBA1C 7.2 (A) 08/30/2022   HGBA1C 8.5 (H) 05/09/2022   HGBA1C 10.7 (H) 03/10/2022   MICROALBUR 5.0 (H) 07/18/2020   MICROALBUR 28.5 (H) 05/28/2019   MICROALBUR 87.0 (H) 06/16/2017     Lipid Panel ( most recent) Lipid Panel     Component Value Date/Time   CHOL 141 04/12/2022 0756   TRIG 53.0 04/12/2022 0756   HDL 45.90 04/12/2022 0756  CHOLHDL 3 04/12/2022 0756    VLDL 10.6 04/12/2022 0756   LDLCALC 85 04/12/2022 0756   LDLCALC 119 (H) 10/19/2021 1511      Lab Results  Component Value Date   TSH 1.280 04/09/2022   TSH 1.16 08/27/2019   TSH 0.82 05/09/2017   FREET4 0.97 08/27/2019   FREET4 1.1 05/09/2017           Assessment & Plan:   1) Type 2 diabetes mellitus with foot ulcer, with long-term current use of insulin (HCC)  He presents today, accompanied by his wife, with his CGM showing at goal glycemic profile overall.  His POCT A1c today is 7.2%, unchanged from previous visit.  Analysis of his CGM shows TIR 77%, TAR 23%, TBR 0% with a GMI of 7.1%.  He did have right foot surgery since last visit and is healing nicely.  He continues to work on diet, does still consume some SF products such as yogurt, jello, puddings.    - John [name] has currently uncontrolled symptomatic type 2 DM since 57 years of age.   -Recent labs reviewed.  - I had a long discussion with him about the progressive nature of diabetes and the pathology behind its complications. -his diabetes is complicated by CHF, CKD stage 3a, diabetic foot ulcer and he remains at a high risk for more acute and chronic complications which include CAD, CVA, CKD, retinopathy, and neuropathy. These are all discussed in detail with him.  The following Lifestyle Medicine recommendations according to American College of Lifestyle Medicine [facility]) were discussed and offered to patient and he agrees to start the journey:  A. Whole Foods, Plant-based plate comprising of fruits and vegetables, plant-based proteins, whole-grain carbohydrates was discussed in detail with the patient.   A list for source of those nutrients were also provided to the patient.  Patient will use only water or unsweetened tea for hydration. B.  The need to stay away from risky substances including alcohol, smoking; obtaining 7 to 9 hours of restorative sleep, at least 150 minutes of moderate intensity exercise weekly,  the importance of healthy social connections,  and stress reduction techniques were discussed. C.  A full color page of  Calorie density of various food groups per pound showing examples of each food groups was provided to the patient.  - Nutritional counseling repeated at each appointment due to patients tendency to fall back in to old habits.  - The patient admits there is a room for improvement in their diet and drink choices. -  Suggestion is made for the patient to avoid simple carbohydrates from their diet including Cakes, Sweet Desserts / Pastries, Ice Cream, Soda (diet and regular), Sweet Tea, Candies, Chips, Cookies, Sweet Pastries, Store Bought Juices, Alcohol in Excess of 1-2 drinks a day, Artificial Sweeteners, Coffee Creamer, and "Sugar-free" Products. This will help patient to have stable blood glucose profile and potentially avoid unintended weight gain.   - I encouraged the patient to switch to unprocessed or minimally processed complex starch and increased protein intake (animal or plant source), fruits, and vegetables.   - Patient is advised to stick to a routine mealtimes to eat 3 meals a day and avoid unnecessary snacks (to snack only to correct hypoglycemia).  - I have approached him with the following individualized plan to manage his diabetes and patient agrees:   - he is advised to increase his [place] to 18 units SQ nightly and continue Farxiga 10 mg po daily.  -he is  encouraged to continue monitoring glucose 4 times daily (using his CGM), before meals and before bed, and to call the clinic if he has readings less than 70 or above 300 for 3 tests in a row.  - he is warned not to take insulin without proper monitoring per orders. - Adjustment parameters are given to him for hypo and hyperglycemia in writing. - he is encouraged to call clinic for blood glucose levels less than 70 or above 300 mg /dl.  - he is not a candidate for Metformin due to concurrent renal  insufficiency.  - he is not an ideal candidate for incretin therapy due to body habitus.  - Specific targets for  A1c; LDL, HDL, and Triglycerides were discussed with the patient.  2) Blood Pressure /Hypertension:  his blood pressure is controlled to target.   he is advised to continue his current medications including Coreg 25 mg po twice daily, Lasix 20 mg po daily as needed for fluid, Bidil 20-37.5 mg 1/2 tab po twice daily, Entresto 97-103 mg po twice daily, and Aldactone 25 mg po daily.    3) Lipids/Hyperlipidemia:    Review of his recent lipid panel from 04/12/22 showed controlled LDL at 85 .  he is advised to continue Lipitor 80 mg daily at bedtime.  Side effects and precautions discussed with him.  4)  Weight/Diet:  his Body mass index is 25.88 kg/m.  -  he is NOT a candidate for weight loss.  Exercise, and detailed carbohydrates information provided  -  detailed on discharge instructions.  5) Chronic Care/Health Maintenance: -he is on ACEI/ARB and Statin medications and is encouraged to initiate and continue to follow up with Ophthalmology, Dentist, Podiatrist at least yearly or according to recommendations, and advised to stay away from smoking. I have recommended yearly flu vaccine and pneumonia vaccine at least every 5 years; moderate intensity exercise for up to 150 minutes weekly; and sleep for at least 7 hours a day.  - he is advised to maintain close follow up with Sharlene Dory, DO for primary care needs, as well as his other providers for optimal and coordinated care.     I spent  40  minutes in the care of the patient today including review of labs from CMP, Lipids, Thyroid Function, Hematology (current and previous including abstractions from other facilities); face-to-face time discussing  his blood glucose readings/logs, discussing hypoglycemia and hyperglycemia episodes and symptoms, medications doses, his options of short and long term treatment based on the  latest standards of care / guidelines;  discussion about incorporating lifestyle medicine;  and documenting the encounter. Risk reduction counseling performed per USPSTF guidelines to reduce obesity and cardiovascular risk factors.     Please refer to Patient Instructions for Blood Glucose Monitoring and Insulin/Medications Dosing Guide"  in media tab for additional information. Please  also refer to " Patient Self Inventory" in the Media  tab for reviewed elements of pertinent patient history.  John [name] participated in the discussions, expressed understanding, and voiced agreement with the above plans.  All questions were answered to his satisfaction. he is encouraged to contact clinic should he have any questions or concerns prior to his return visit.     Follow up plan: - Return in about 4 months (around 04/07/2023) for Diabetes F/U with A1c in office, No previsit labs, Bring meter and logs.   Ronny Bacon, [facility] [address] [place], [place] [address] Phone: [phone] Fax: [phone]  12/06/2022, 11:50 AM

## 2022-12-09 ENCOUNTER — Other Ambulatory Visit (HOSPITAL_COMMUNITY): Payer: Self-pay

## 2022-12-18 ENCOUNTER — Encounter: Payer: Self-pay | Admitting: Podiatry

## 2022-12-18 ENCOUNTER — Ambulatory Visit (INDEPENDENT_AMBULATORY_CARE_PROVIDER_SITE_OTHER): Payer: Commercial Managed Care - PPO | Admitting: Podiatry

## 2022-12-18 ENCOUNTER — Ambulatory Visit (INDEPENDENT_AMBULATORY_CARE_PROVIDER_SITE_OTHER): Payer: Commercial Managed Care - PPO

## 2022-12-18 VITALS — Ht 70.0 in | Wt 180.0 lb

## 2022-12-18 DIAGNOSIS — Z9889 Other specified postprocedural states: Secondary | ICD-10-CM | POA: Diagnosis not present

## 2022-12-18 NOTE — Progress Notes (Signed)
Chief Complaint  Patient presents with   Routine Post Op    RM10: POV # 3 DOS 11/21/22 --- RIGHT GREAT TOE ARTHRODESIS     Subjective:  Patient presents today status post right great toe IPJ arthrodesis.  DOS: 11/21/2022.  This patient doing well.  WBAT in the cam boot.  No new complaints  Past Medical History:  Diagnosis Date   Asthma    CAD in native artery    CHF (congestive heart failure) (HCC)    Chronic HFrEF (heart failure with reduced ejection fraction) (HCC)    CKD (chronic kidney disease) stage 2, GFR 60-89 ml/min    Diabetic foot ulcer (HCC)    History of chicken pox    History of DVT (deep vein thrombosis)    s/p trauma of R leg -- 2014. No other history of DVT   History of kidney stones    Hypertension    Lazy eye of left side    NICM (nonischemic cardiomyopathy) (HCC)    Pneumonia    Uncontrolled diabetes mellitus with hyperglycemia [facility])     Past Surgical History:  Procedure Laterality Date   AMPUTATION TOE Left 05/16/2021   Procedure: AMPUTATION LEFT GREAT  TOE;  Surgeon: Vivi Barrack, DPM;  [facility];  Service: Podiatry;  Laterality: Left;   BONE BIOPSY Right 05/10/2022   Procedure: BONE BIOPSY;  Surgeon: Felecia Shelling, DPM;  [facility]: [facility];  Service: Podiatry;  Laterality: Right;   EXOSTECTECTOMY TOE Right 05/10/2022   Procedure: EXOSTECTECTOMY TOE;  Surgeon: Felecia Shelling, DPM;  Location: [facility];  Service: Podiatry;  Laterality: Right;   EYE SURGERY Right    cataract surgery  w/ IOL   RIGHT/LEFT HEART CATH AND CORONARY ANGIOGRAPHY N/A 03/11/2022   Procedure: RIGHT/LEFT HEART CATH AND CORONARY ANGIOGRAPHY;  Surgeon: Yvonne Kendall, MD;  [facility];  Service: Cardiovascular;  Laterality: N/A;    Allergies  Allergen Reactions   Bidil [Isosorb Dinitrate-Hydralazine] Other (See Comments)    Dropped BP - but has tolerated lower dose   Zofran [Ondansetron Hcl] Nausea Only    Abdominal pain    Objective/Physical  Exam Neurovascular status intact.  Incision healed.  Heavy hyperkeratotic callus tissue noted on the plantar aspect of the toe.  No erythema or edema.  Clinically no indication or concern for infection  Radiographic Exam RT foot 12/18/2022:  Unchanged.  Rectus alignment of the great toe.  The orthopedic screw does appear to be plantar to the distal phalanx.  There must be some stability at the IPJ and it appears that the screw does cross the base of the proximal phalanx stabilizing the joint.  Assessment: 1. s/p IPJ arthrodesis right great toe. DOS: 11/27/2022 2.  Pain due to onychomycosis of toenails both 3.  Diabetes mellitus with peripheral polyneuropathy  Plan of Care:  -Patient was evaluated.  X-rays reviewed.  I am somewhat unsatisfied with the placement of the orthopedic screw.  The screw is plantar to the distal phalanx of the toe.  It does appear to be holding the arthrodesis site intact however.  Explained to the patient that the screw may need to be removed down the road - Debridement of the hyperkeratotic callus tissue around the plantar aspect of the great toe performed today using a tissue nipper.  There is significant improvement of the ulcer to the plantar aspect of the toe -Recommend Silvadene cream and a light dressing daily.  Supplies were provided -Patient may slowly  transition out of the cam boot into good supportive tennis shoes over the next 4 weeks -Continue reduced activity -Return to clinic 4 weeks follow-up x-ray  Felecia Shelling, DPM [facility]  Dr. Felecia Shelling, DPM    [address] [place], [place] [address]                Office [phone]  Fax [phone]

## 2022-12-26 ENCOUNTER — Ambulatory Visit
Admission: RE | Admit: 2022-12-26 | Discharge: 2022-12-26 | Disposition: A | Discharge status: Home / Self Care | Payer: Commercial Managed Care - PPO | Source: Ambulatory Visit | Attending: Physician Assistant | Admitting: Physician Assistant

## 2022-12-26 ENCOUNTER — Other Ambulatory Visit: Payer: Self-pay

## 2022-12-26 VITALS — BP 151/91 | HR 78 | Temp 97.9°F | Resp 18

## 2022-12-26 DIAGNOSIS — H6123 Impacted cerumen, bilateral: Secondary | ICD-10-CM

## 2022-12-26 MED ORDER — CARBAMIDE PEROXIDE 6.5 % OT SOLN
5.0000 [drp] | Freq: Once | OTIC | Status: AC
  Administered 2022-12-26: 5 [drp] via OTIC

## 2022-12-26 NOTE — ED Triage Notes (Signed)
Pt reports ear wax build up bilaterally with muffled hearing x 2 days. States he has had this before

## 2022-12-26 NOTE — ED Provider Notes (Signed)
[facility]    CSN: [id number] Arrival date & time: 12/26/22  1004      History   Chief Complaint Chief Complaint  Patient presents with   Ear Fullness    Entered by patient    HPI John [name] is a 57 y.o. male.   Patient here today for evaluation of possible earwax buildup bilaterally.  He reports he has had decreased hearing.  He notes this has occurred in the past to him.  He denies any pain.  The history is provided by the patient.  Ear Fullness Pertinent negatives include no shortness of breath.    Past Medical History:  Diagnosis Date   Asthma    CAD in native artery    CHF (congestive heart failure) (HCC)    Chronic HFrEF (heart failure with reduced ejection fraction) (HCC)    CKD (chronic kidney disease) stage 2, GFR 60-89 ml/min    Diabetic foot ulcer (HCC)    History of chicken pox    History of DVT (deep vein thrombosis)    s/p trauma of R leg -- 2014. No other history of DVT   History of kidney stones    Hypertension    Lazy eye of left side    NICM (nonischemic cardiomyopathy) (HCC)    Pneumonia    Uncontrolled diabetes mellitus with hyperglycemia [facility])     Patient Active Problem List   Diagnosis Date Noted   Acute HFrEF (heart failure with reduced ejection fraction) (HCC) 03/11/2022   Heart failure (HCC) 03/11/2022   Elevated troponin 03/09/2022   Enteritis 03/09/2022   Intermittent asthma 03/09/2022   Nausea & vomiting 05/17/2021   Positive D dimer 05/16/2021   COVID-19 virus infection 05/15/2021   Hyponatremia 05/15/2021   Normocytic anemia 05/15/2021   Osteomyelitis of great toe of left foot (HCC) 05/14/2021   CKD (chronic kidney disease) stage 3, GFR 30-59 ml/min (HCC) 05/14/2021   Diabetes mellitus due to underlying condition with diabetic autonomic neuropathy, unspecified whether long term insulin use (HCC) 07/24/2020   Hyperlipidemia LDL goal <100 12/06/2015   Essential hypertension 04/06/2012   Type 2 diabetes mellitus  with hyperglycemia, with long-term current use of insulin (HCC) 03/20/2012   History of DVT (deep vein thrombosis) 03/19/2012    Past Surgical History:  Procedure Laterality Date   AMPUTATION TOE Left 05/16/2021   Procedure: AMPUTATION LEFT GREAT  TOE;  Surgeon: Vivi Barrack, DPM;  Location: [facility] OR;  Service: Podiatry;  Laterality: Left;   BONE BIOPSY Right 05/10/2022   Procedure: BONE BIOPSY;  Surgeon: Felecia Shelling, DPM;  Location: [facility] ORS;  Service: Podiatry;  Laterality: Right;   EXOSTECTECTOMY TOE Right 05/10/2022   Procedure: EXOSTECTECTOMY TOE;  Surgeon: Felecia Shelling, DPM;  Location: [facility];  Service: Podiatry;  Laterality: Right;   EYE SURGERY Right    cataract surgery  w/ IOL   RIGHT/LEFT HEART CATH AND CORONARY ANGIOGRAPHY N/A 03/11/2022   Procedure: RIGHT/LEFT HEART CATH AND CORONARY ANGIOGRAPHY;  Surgeon: Yvonne Kendall, MD;  Location: [facility] INVASIVE CV LAB;  Service: Cardiovascular;  Laterality: N/A;       Home Medications    Prior to Admission medications   Medication Sig Start Date End Date Taking? Authorizing Provider  albuterol (VENTOLIN HFA) 108 (90 Base) MCG/ACT inhaler Inhale 2 puffs into the lungs every 4 (four) hours as needed for wheezing or shortness of breath. 03/05/22  Yes Sharlene Dory, DO  aspirin 81 MG chewable tablet Chew 81 mg by  mouth daily.   Yes [provider]  atorvastatin (LIPITOR) 80 MG tablet Take 1 tablet (80 mg total) by mouth daily. 05/02/22  Yes Sabharwal, Aditya, DO  carvedilol (COREG) 25 MG tablet Take 1 tablet (25 mg total) by mouth 2 (two) times daily with a meal. 03/05/22  Yes Wendling, Jilda Roche, DO  Continuous Glucose Sensor (FREESTYLE LIBRE 3 SENSOR) MISC Place 1 sensor on the skin every 14 days. Use to check glucose continuously 08/30/22  Yes Dani Gobble, NP  dapagliflozin propanediol (FARXIGA) 10 MG TABS tablet Take 1 tablet (10 mg total) by mouth daily before breakfast. 08/30/22  Yes Dani Gobble, NP   furosemide (LASIX) 20 MG tablet Take 1 tablet (20 mg total) by mouth daily as needed for edema or fluid. 03/12/22 05/03/23 Yes Zigmund Daniel., MD  insulin degludec (TRESIBA FLEXTOUCH) 100 UNIT/ML FlexTouch Pen Inject 18 Units into the skin at bedtime. 12/06/22  Yes Reardon, Freddi Starr, NP  sacubitril-valsartan (ENTRESTO) 97-103 MG Take 1 tablet by mouth 2 (two) times daily. NEEDS FOLLOW UP APPOINTMENT FOR MORE REFILLS 11/05/22  Yes Sabharwal, Aditya, DO  spironolactone (ALDACTONE) 25 MG tablet Take 1 tablet (25 mg total) by mouth daily. 05/30/22  Yes Sabharwal, Aditya, DO  Insulin Pen Needle (NOVOFINE PLUS PEN NEEDLE) 32G X 4 MM MISC Use daily to insulin 05/19/21   Regalado, Belkys A, MD    Family History Family History  Problem Relation Age of Onset   Heart disease Mother 63   Arthritis Mother    Heart disease Father 6   Stroke Father    Healthy Brother    Heart disease Maternal Grandmother    Diabetes Maternal Uncle     Social History Social History   Tobacco Use   Smoking status: Never   Smokeless tobacco: Never  Vaping Use   Vaping status: Never Used  Substance Use Topics   Alcohol use: No   Drug use: No     Allergies   Bidil [isosorb dinitrate-hydralazine] and Zofran [ondansetron hcl]   Review of Systems Review of Systems  Constitutional:  Negative for chills and fever.  HENT:  Positive for hearing loss. Negative for ear pain.   Eyes:  Negative for discharge and redness.  Respiratory:  Negative for shortness of breath.   Neurological:  Negative for numbness.     Physical Exam Triage Vital Signs ED Triage Vitals  Encounter Vitals Group     BP      Systolic BP Percentile      Diastolic BP Percentile      Pulse      Resp      Temp      Temp src      SpO2      Weight      Height      Head Circumference      Peak Flow      Pain Score      Pain Loc      Pain Education      Exclude from Growth Chart    No data found.  Updated Vital Signs BP (!)  151/91 (BP Location: Right Arm)   Pulse 78   Temp 97.9 F (36.6 C) (Oral)   Resp 18   SpO2 97%       Physical Exam Vitals and nursing note reviewed.  Constitutional:      General: He is not in acute distress.    Appearance: Normal appearance. He is not ill-appearing.  HENT:     Head: Normocephalic and atraumatic.     Right Ear: Tympanic membrane normal.     Ears:     Comments: Initially cerumen impaction present to bilateral EACs.  After irrigation some mild cerumen still present to left EAC.    Nose: Nose normal. No congestion or rhinorrhea.  Eyes:     Conjunctiva/sclera: Conjunctivae normal.  Cardiovascular:     Rate and Rhythm: Normal rate.  Pulmonary:     Effort: Pulmonary effort is normal. No respiratory distress.  Neurological:     Mental Status: He is alert.  Psychiatric:        Mood and Affect: Mood normal.        Behavior: Behavior normal.        Thought Content: Thought content normal.      UC Treatments / Results  Labs (all labs ordered are listed, but only abnormal results are displayed) Labs Reviewed - No data to display  EKG   Radiology No results found.  Procedures Procedures (including critical care time)  Medications Ordered in UC Medications  carbamide peroxide (DEBROX) 6.5 % OTIC (EAR) solution 5 drop (5 drops Both EARS Given 12/26/22 1101)    Initial Impression / Assessment and Plan / UC Course  I have reviewed the triage vital signs and the nursing notes.  Pertinent labs & imaging results that were available during my care of the patient were reviewed by me and considered in my medical decision making (see chart for details).   Okay bilateral ears irrigated.  You bilateral ears irrigated in office with successful removal of cerumen in right EAC.  Debrox drops used in office and given to patient for left cerumen impaction  Final Clinical Impressions(s) / UC Diagnoses   Final diagnoses:  Hearing loss due to cerumen impaction,  bilateral   Discharge Instructions   None    ED Prescriptions   None    PDMP not reviewed this encounter.   Tomi Bamberger, PA-C 12/26/22 [phone]

## 2023-01-01 ENCOUNTER — Encounter: Payer: Self-pay | Admitting: Family Medicine

## 2023-01-01 ENCOUNTER — Ambulatory Visit: Payer: Commercial Managed Care - PPO | Admitting: Family Medicine

## 2023-01-01 VITALS — BP 108/78 | HR 48 | Temp 98.3°F | Ht 70.0 in | Wt 181.4 lb

## 2023-01-01 DIAGNOSIS — H6122 Impacted cerumen, left ear: Secondary | ICD-10-CM

## 2023-01-01 NOTE — Patient Instructions (Signed)
OK to use Debrox (peroxide) in the ear to loosen up wax. Also recommend using a bulb syringe (for removing boogers from baby's noses) to flush through warm water and vinegar (3-4:1 ratio). An alternative, though more expensive, is an elephant ear washer wax removal kit. Do not use Q-tips as this can impact wax further.  Let us know if you need anything.  

## 2023-01-01 NOTE — Progress Notes (Signed)
Chief Complaint  Patient presents with   Ear Fullness    Left ear Flushed out at UC a few days ago    Pt is here for left ear pain. Duration: 7 days Progression:  a little better Associated symptoms: slight hearing loss, slight discomfort Denies: fevers, nasal congestion/dc, bleeding, or discharge from ear Treatment to date: Debrox  Past Medical History:  Diagnosis Date   Asthma    CAD in native artery    CHF (congestive heart failure) (HCC)    Chronic HFrEF (heart failure with reduced ejection fraction) (HCC)    CKD (chronic kidney disease) stage 2, GFR 60-89 ml/min    Diabetic foot ulcer (HCC)    History of chicken pox    History of DVT (deep vein thrombosis)    s/p trauma of R leg -- 2014. No other history of DVT   History of kidney stones    Hypertension    Lazy eye of left side    NICM (nonischemic cardiomyopathy) (HCC)    Pneumonia    Uncontrolled diabetes mellitus with hyperglycemia (HCC)     BP 108/78 (BP Location: Right Arm, Patient Position: Sitting, Cuff Size: Normal)   Pulse (!) 48   Temp 98.3 F (36.8 C) (Oral)   Ht 5\' 10"  (1.778 m)   Wt 181 lb 6 oz (82.3 kg)   SpO2 98%   BMI 26.02 kg/m  General: Awake, alert, appearing stated age HEENT:  L ear- Canal 100% obstructed w cerumen R ear- canal patent without drainage or erythema, TM is neg Nose- nares patent and without discharge Mouth- Lips, gums and dentition unremarkable, pharynx is without erythema or exudate Neck: No adenopathy Lungs: Normal effort, no accessory muscle use Psych: Age appropriate judgment and insight, normal mood and affect  Procedure note: Cerumen removal instrumentation Verbal consent obtained. An alligator forceps was used to remove wax visible in canal.  Cerumen successfully removed. Irrigation was then performed to get the deeper wax. Pt reported immediate improvement. Pt tolerated procedure well. There were no immediate complications noted.  Impacted cerumen of left  ear  Don't use Q tips. Instrumentation and irrigation today. Home instructions provided in AVS.  F/u prn.  Pt voiced understanding and agreement to the plan.  Jilda Roche [place], DO 01/01/23 1:44 PM

## 2023-01-04 ENCOUNTER — Other Ambulatory Visit: Payer: Self-pay | Admitting: Family Medicine

## 2023-01-04 DIAGNOSIS — I10 Essential (primary) hypertension: Secondary | ICD-10-CM

## 2023-01-06 ENCOUNTER — Other Ambulatory Visit: Payer: Self-pay

## 2023-01-06 ENCOUNTER — Other Ambulatory Visit (HOSPITAL_COMMUNITY): Payer: Self-pay

## 2023-01-06 MED ORDER — CARVEDILOL 25 MG PO TABS
25.0000 mg | ORAL_TABLET | Freq: Two times a day (BID) | ORAL | 2 refills | Status: DC
  Filled 2023-01-06: qty 180, 90d supply, fill #0
  Filled 2023-02-03 (×3): qty 180, 90d supply, fill #1
  Filled 2023-08-06: qty 180, 90d supply, fill #2

## 2023-01-07 ENCOUNTER — Encounter: Payer: Self-pay | Admitting: Cardiology

## 2023-01-07 ENCOUNTER — Ambulatory Visit: Payer: Commercial Managed Care - PPO | Attending: Cardiology | Admitting: Cardiology

## 2023-01-07 VITALS — BP 146/88 | HR 70 | Ht 70.0 in | Wt 182.0 lb

## 2023-01-07 DIAGNOSIS — I5022 Chronic systolic (congestive) heart failure: Secondary | ICD-10-CM

## 2023-01-07 DIAGNOSIS — I10 Essential (primary) hypertension: Secondary | ICD-10-CM | POA: Diagnosis not present

## 2023-01-07 DIAGNOSIS — Z79899 Other long term (current) drug therapy: Secondary | ICD-10-CM

## 2023-01-07 NOTE — Progress Notes (Signed)
Cardiology Office Note:  .   Date:  01/07/2023  ID:  John [name], DOB [birth year], MRN [id number] PCP: Sharlene Dory, DO  [facility] Providers Cardiologist:  Donato Schultz, MD     History of Present Illness: .   John [name] is a 57 y.o. male Discussed with the use of AI scribe  History of Present Illness   The patient is a 57 year old individual with a history of chronic systolic heart failure, possibly secondary to difficult-to-control hypertension, and non-ischemic cardiomyopathy. He is classified as NYHA class two. The patient is currently on Coreg 25mg  twice daily, Spironolactone 25mg  daily, and Jardiance 10mg  daily. An MRI demonstrated an improvement in ejection fraction to 43%. He also has type 2 diabetes with a prior hemoglobin A1c of 10.5.  The patient reports feeling well with no complaints of shortness of breath, fainting, or chest pain. He has been under the care of an advanced heart failure team and has seen significant improvement in his condition.  The patient's most recent blood work in April showed a creatinine level of 1.5 and potassium of 3.4. His A1c has improved to 7.2.          ROS: No CP, no syncope  Studies Reviewed: .        Results LABS HbA1c: 10.5% Cr: 1.5 mg/dL (40/9811) K: 3.4 mmol/L (06/2022)  RADIOLOGY MRI: Improvement in ejection fraction to 43%  Risk Assessment/Calculations:      STOP-Bang Score:  5      Physical Exam:   VS:  BP (!) 146/88   Pulse 70   Ht 5\' 10"  (1.778 m)   Wt 182 lb (82.6 kg)   SpO2 97%   BMI 26.11 kg/m    Wt Readings from Last 3 Encounters:  01/07/23 182 lb (82.6 kg)  01/01/23 181 lb 6 oz (82.3 kg)  12/18/22 180 lb (81.6 kg)    GEN: Well nourished, well developed in no acute distress NECK: No JVD; No carotid bruits CARDIAC: RRR, no murmurs, no rubs, no gallops RESPIRATORY:  Clear to auscultation without rales, wheezing or rhonchi  ABDOMEN: Soft, non-tender, non-distended EXTREMITIES:  No  edema; No deformity   ASSESSMENT AND PLAN: .    Assessment and Plan    Non-ischemic cardiomyopathy (possibly hypertensive) NYHA class II, improved with medication regimen (Coreg 25mg  BID, Spironolactone 25mg  daily, Jardiance 10mg  daily). MRI showed improved ejection fraction of 43%. No symptoms of heart failure reported. -Continue current medication regimen. -Order CBC and BMET today. -Six month follow-up with APP and one year follow-up with me. -Appreciate Advanced HF clinic assistance in his care to assist with workup and medication strategy.   Type 2 Diabetes Improved control with recent A1C of 7.2, down from 10.5. -Continue current management. -Check blood glucose levels today.             Signed, Donato Schultz, MD

## 2023-01-07 NOTE — Patient Instructions (Signed)
Medication Instructions:  The current medical regimen is effective;  continue present plan and medications.  *If you need a refill on your cardiac medications before your next appointment, please call your pharmacy*   Lab Work: Please have blood work today (CBC, BMP) If you have labs (blood work) drawn today and your tests are completely normal, you will receive your results only by: MyChart Message (if you have MyChart) OR A paper copy in the mail If you have any lab test that is abnormal or we need to change your treatment, we will call you to review the results.  Follow-Up: At [facility], you and your health needs are our priority.  As part of our continuing mission to provide you with exceptional heart care, we have created designated Provider Care Teams.  These Care Teams include your primary Cardiologist (physician) and Advanced Practice Providers (APPs -  Physician Assistants and Nurse Practitioners) who all work together to provide you with the care you need, when you need it.  We recommend signing up for the patient portal called "MyChart".  Sign up information is provided on this After Visit Summary.  MyChart is used to connect with patients for Virtual Visits (Telemedicine).  Patients are able to view lab/test results, encounter notes, upcoming appointments, etc.  Non-urgent messages can be sent to your provider as well.   To learn more about what you can do with MyChart, go to [email or web address].    Your next appointment:   6 month(s)  Provider:   Jari Favre, PA-C, Robin Searing, NP, Eligha Bridegroom, NP, Tereso Newcomer, PA-C, or Perlie Gold, PA-C     Then, Donato Schultz, MD will plan to see you again in 1 year(s).

## 2023-01-08 LAB — BASIC METABOLIC PANEL
BUN/Creatinine Ratio: 16 (ref 9–20)
BUN: 27 mg/dL — ABNORMAL HIGH (ref 6–24)
CO2: 20 mmol/L (ref 20–29)
Calcium: 9.3 mg/dL (ref 8.7–10.2)
Chloride: 107 mmol/L — ABNORMAL HIGH (ref 96–106)
Creatinine, Ser: 1.68 mg/dL — ABNORMAL HIGH (ref 0.76–1.27)
Glucose: 165 mg/dL — ABNORMAL HIGH (ref 70–99)
Potassium: 4 mmol/L (ref 3.5–5.2)
Sodium: 142 mmol/L (ref 134–144)
eGFR: 47 mL/min/{1.73_m2} — ABNORMAL LOW (ref >59)

## 2023-01-08 LAB — CBC
Hematocrit: 39.4 % (ref 37.5–51.0)
Hemoglobin: 12.8 g/dL — ABNORMAL LOW (ref 13.0–17.7)
MCH: 31.1 pg (ref 26.6–33.0)
MCHC: 32.5 g/dL (ref 31.5–35.7)
MCV: 96 fL (ref 79–97)
Platelets: 134 10*3/uL — ABNORMAL LOW (ref 150–450)
RBC: 4.11 x10E6/uL — ABNORMAL LOW (ref 4.14–5.80)
RDW: 12.8 % (ref 11.6–15.4)
WBC: 6.2 10*3/uL (ref 3.4–10.8)

## 2023-01-14 DIAGNOSIS — M79673 Pain in unspecified foot: Secondary | ICD-10-CM

## 2023-01-22 ENCOUNTER — Ambulatory Visit (INDEPENDENT_AMBULATORY_CARE_PROVIDER_SITE_OTHER): Payer: Commercial Managed Care - PPO | Admitting: Podiatry

## 2023-01-22 ENCOUNTER — Encounter: Payer: Self-pay | Admitting: Podiatry

## 2023-01-22 ENCOUNTER — Ambulatory Visit (INDEPENDENT_AMBULATORY_CARE_PROVIDER_SITE_OTHER): Payer: Commercial Managed Care - PPO

## 2023-01-22 DIAGNOSIS — M2031 Hallux varus (acquired), right foot: Secondary | ICD-10-CM

## 2023-01-22 DIAGNOSIS — L97512 Non-pressure chronic ulcer of other part of right foot with fat layer exposed: Secondary | ICD-10-CM | POA: Diagnosis not present

## 2023-01-22 DIAGNOSIS — Z9889 Other specified postprocedural states: Secondary | ICD-10-CM

## 2023-01-22 NOTE — Progress Notes (Signed)
Chief Complaint  Patient presents with   Routine Post Op    RIGHT FOOT Denies N/V/F/C/SOB, NO PAIN, DRAINAGE OR SWELLING   Callouses    CALLUS LEFT FOOT    Subjective:  Patient presents today status post right great toe IPJ arthrodesis.  DOS: 11/21/2022.  This patient doing well.  WBAT surgical shoe.  Overall he is doing very well with no new complaints at this time  Past Medical History:  Diagnosis Date   Asthma    CAD in native artery    CHF (congestive heart failure) (HCC)    Chronic HFrEF (heart failure with reduced ejection fraction) (HCC)    CKD (chronic kidney disease) stage 2, GFR 60-89 ml/min    Diabetic foot ulcer (HCC)    History of chicken pox    History of DVT (deep vein thrombosis)    s/p trauma of R leg -- 2014. No other history of DVT   History of kidney stones    Hypertension    Lazy eye of left side    NICM (nonischemic cardiomyopathy) (HCC)    Pneumonia    Uncontrolled diabetes mellitus with hyperglycemia [facility])     Past Surgical History:  Procedure Laterality Date   AMPUTATION TOE Left 05/16/2021   Procedure: AMPUTATION LEFT GREAT  TOE;  Surgeon: Vivi Barrack, DPM;  Location: [facility];  Service: Podiatry;  Laterality: Left;   BONE BIOPSY Right 05/10/2022   Procedure: BONE BIOPSY;  Surgeon: Felecia Shelling, DPM;  Location: [facility];  Service: Podiatry;  Laterality: Right;   EXOSTECTECTOMY TOE Right 05/10/2022   Procedure: EXOSTECTECTOMY TOE;  Surgeon: Felecia Shelling, DPM;  Location: [facility];  Service: Podiatry;  Laterality: Right;   EYE SURGERY Right    cataract surgery  w/ IOL   RIGHT/LEFT HEART CATH AND CORONARY ANGIOGRAPHY N/A 03/11/2022   Procedure: RIGHT/LEFT HEART CATH AND CORONARY ANGIOGRAPHY;  Surgeon: Yvonne Kendall, MD;  [facility]: [facility];  Service: Cardiovascular;  Laterality: N/A;    Allergies  Allergen Reactions   Bidil [Isosorb Dinitrate-Hydralazine] Other (See Comments)    Dropped BP - but has tolerated lower dose   Zofran  [Ondansetron Hcl] Nausea Only    Abdominal pain    RT great toe 01/22/2023  RT great toe 01/22/2023  Objective/Physical Exam Neurovascular status intact.  Incision nicely healed.  The wound to the plantar aspect of the toe has healed and resolved completely.  There is no erythema or edema around the toe.  Overall successful surgery with well-healed toe  Radiographic Exam RT foot 01/22/2023:  Unchanged.  Rectus alignment of the great toe.  The orthopedic screw does appear to be plantar to the distal phalanx.  There must be some stability at the IPJ and it appears that the screw does cross the base of the proximal phalanx stabilizing the joint.  Assessment: 1. s/p IPJ arthrodesis right great toe. DOS: 11/27/2022 2.  Pain due to onychomycosis of toenails both 3.  Diabetes mellitus with peripheral polyneuropathy 4.  Preulcerative callus lesions left  Plan of Care:  -Patient was evaluated.  X-rays reviewed -Although there is mild placement of the orthopedic screw currently the toe looks great without any complaints or complications.  Will plan to simply observe for now -Patient may return to work full activity no restrictions -Continue diabetic shoes and insoles -Excisional debridement of the preulcerative callus tissue was performed to the left foot today without incident or bleeding -Return to clinic 3 months routine footcare  and follow-up x-rays of the right foot  Felecia Shelling, DPM [facility]  Dr. Felecia Shelling, DPM    [address] [place], [place] [address]                Office [phone]  Fax [phone]

## 2023-02-03 ENCOUNTER — Other Ambulatory Visit: Payer: Self-pay | Admitting: Cardiology

## 2023-02-03 ENCOUNTER — Other Ambulatory Visit (HOSPITAL_COMMUNITY): Payer: Self-pay

## 2023-02-03 ENCOUNTER — Other Ambulatory Visit: Payer: Self-pay

## 2023-02-03 MED ORDER — ENTRESTO 97-103 MG PO TABS
1.0000 | ORAL_TABLET | Freq: Two times a day (BID) | ORAL | 0 refills | Status: DC
  Filled 2023-02-03: qty 180, 90d supply, fill #0

## 2023-02-16 ENCOUNTER — Other Ambulatory Visit: Payer: Self-pay

## 2023-02-17 ENCOUNTER — Other Ambulatory Visit (HOSPITAL_COMMUNITY): Payer: Self-pay

## 2023-02-18 ENCOUNTER — Other Ambulatory Visit (HOSPITAL_COMMUNITY): Payer: Self-pay

## 2023-02-26 ENCOUNTER — Other Ambulatory Visit (HOSPITAL_COMMUNITY): Payer: Self-pay

## 2023-02-26 ENCOUNTER — Other Ambulatory Visit: Payer: Self-pay

## 2023-02-27 ENCOUNTER — Other Ambulatory Visit (HOSPITAL_COMMUNITY): Payer: Self-pay

## 2023-04-07 ENCOUNTER — Ambulatory Visit: Payer: Commercial Managed Care - PPO | Admitting: Nurse Practitioner

## 2023-04-07 ENCOUNTER — Encounter: Payer: Self-pay | Admitting: Nurse Practitioner

## 2023-04-07 VITALS — BP 154/94 | HR 76 | Ht 70.0 in | Wt 179.6 lb

## 2023-04-07 DIAGNOSIS — Z7984 Long term (current) use of oral hypoglycemic drugs: Secondary | ICD-10-CM | POA: Diagnosis not present

## 2023-04-07 DIAGNOSIS — E1165 Type 2 diabetes mellitus with hyperglycemia: Secondary | ICD-10-CM

## 2023-04-07 DIAGNOSIS — N1832 Chronic kidney disease, stage 3b: Secondary | ICD-10-CM | POA: Diagnosis not present

## 2023-04-07 DIAGNOSIS — E1122 Type 2 diabetes mellitus with diabetic chronic kidney disease: Secondary | ICD-10-CM | POA: Diagnosis not present

## 2023-04-07 DIAGNOSIS — Z794 Long term (current) use of insulin: Secondary | ICD-10-CM

## 2023-04-07 LAB — POCT UA - MICROALBUMIN: Albumin/Creatinine Ratio, Urine, POC: >300

## 2023-04-07 LAB — POCT GLYCOSYLATED HEMOGLOBIN (HGB A1C): Hemoglobin A1C: 6.7 % — AB (ref 4.0–5.6)

## 2023-04-07 NOTE — Progress Notes (Signed)
 Endocrinology Follow UpNote       04/07/2023, 3:58 PM   Subjective:    Patient ID: John [name], male    DOB: [birth year].  John [name] is being seen in follow up after being seen in consultation for management of currently uncontrolled symptomatic diabetes requested by  Frann Mabel Mt, DO.  He is a [employer], works in [employer].   Past Medical History:  Diagnosis Date   Asthma    CAD in native artery    CHF (congestive heart failure) (HCC)    Chronic HFrEF (heart failure with reduced ejection fraction) (HCC)    CKD (chronic kidney disease) stage 2, GFR 60-89 ml/min    Diabetic foot ulcer (HCC)    History of chicken pox    History of DVT (deep vein thrombosis)    s/p trauma of R leg -- 2014. No other history of DVT   History of kidney stones    Hypertension    Lazy eye of left side    NICM (nonischemic cardiomyopathy) (HCC)    Pneumonia    Uncontrolled diabetes mellitus with hyperglycemia [facility])     Past Surgical History:  Procedure Laterality Date   AMPUTATION TOE Left 05/16/2021   Procedure: AMPUTATION LEFT GREAT  TOE;  Surgeon: Gershon Donnice SAUNDERS, DPM;  [facility] OR;  Service: Podiatry;  Laterality: Left;   BONE BIOPSY Right 05/10/2022   Procedure: BONE BIOPSY;  Surgeon: Janit Thresa HERO, DPM;  [facility];  Service: Podiatry;  Laterality: Right;   EXOSTECTECTOMY TOE Right 05/10/2022   Procedure: EXOSTECTECTOMY TOE;  Surgeon: Janit Thresa HERO, DPM;  [facility];  Service: Podiatry;  Laterality: Right;   EYE SURGERY Right    cataract surgery  w/ IOL   RIGHT/LEFT HEART CATH AND CORONARY ANGIOGRAPHY N/A 03/11/2022   Procedure: RIGHT/LEFT HEART CATH AND CORONARY ANGIOGRAPHY;  Surgeon: Mady Bruckner, MD;  [facility] INV[facility];  Service: Cardiovascular;  Laterality: N/A;    Social History   Socioeconomic History   Marital status: Married    Spouse name: Cathi    Number of children: 1   Years of education: Not on file   Highest education level: High school graduate  Occupational History   Occupation: Financial Risk Analyst    Comment: [employer]   Tobacco Use   Smoking status: Never   Smokeless tobacco: Never  Vaping Use   Vaping status: Never Used  Substance and Sexual Activity   Alcohol use: No   Drug use: No   Sexual activity: Yes    Partners: Female  Other Topics Concern   Not on file  Social History Narrative   Not on file   Social Drivers of Health   Financial Resource Strain: Medium Risk (03/12/2022)   Overall Financial Resource Strain (CARDIA)    Difficulty of Paying Living Expenses: Somewhat hard  Food Insecurity: No Food Insecurity (03/09/2022)   Hunger Vital Sign    Worried About Running Out of Food in the Last Year: Never true    Ran Out of Food in the Last Year: Never true  Transportation Needs: No Transportation Needs (03/12/2022)   PRAPARE - Transportation    Lack  of Transportation (Medical): No    Lack of Transportation (Non-Medical): No  Physical Activity: Not on file  Stress: Not on file  Social Connections: Not on file    Family History  Problem Relation Age of Onset   Heart disease Mother 38   Arthritis Mother    Heart disease Father 37   Stroke Father    Healthy Brother    Heart disease Maternal Grandmother    Diabetes Maternal Uncle     Outpatient Encounter Medications as of 04/07/2023  Medication Sig   albuterol  (VENTOLIN  HFA) 108 (90 Base) MCG/ACT inhaler Inhale 2 puffs into the lungs every 4 (four) hours as needed for wheezing or shortness of breath.   aspirin  81 MG chewable tablet Chew 81 mg by mouth daily.   atorvastatin  (LIPITOR) 80 MG tablet Take 1 tablet (80 mg total) by mouth daily.   carvedilol  (COREG ) 25 MG tablet Take 1 tablet (25 mg total) by mouth 2 (two) times daily with a meal.   Continuous Glucose Sensor (FREESTYLE LIBRE 3 SENSOR) MISC Place 1 sensor on the skin every 14 days. Use to  check glucose continuously   dapagliflozin  propanediol (FARXIGA ) 10 MG TABS tablet Take 1 tablet (10 mg total) by mouth daily before breakfast.   insulin  degludec (TRESIBA  FLEXTOUCH) 100 UNIT/ML FlexTouch Pen Inject 18 Units into the skin at bedtime.   Insulin  Pen Needle (NOVOFINE PLUS PEN NEEDLE) 32G X 4 MM MISC Use daily to insulin    isosorbide -hydrALAZINE  (BIDIL ) 20-37.5 MG tablet Take 1/2 tablet by mouth 2 (two) times daily.   sacubitril -valsartan  (ENTRESTO ) 97-103 MG Take 1 tablet by mouth 2 (two) times daily. NEEDS FOLLOW UP APPOINTMENT FOR MORE REFILLS   spironolactone  (ALDACTONE ) 25 MG tablet Take 1 tablet (25 mg total) by mouth daily.   [DISCONTINUED] furosemide  (LASIX ) 20 MG tablet Take 1 tablet (20 mg total) by mouth daily as needed for edema or fluid.   No facility-administered encounter medications on file as of 04/07/2023.    ALLERGIES: Allergies  Allergen Reactions   Bidil  [Isosorb Dinitrate-Hydralazine ] Other (See Comments)    Dropped BP - but has tolerated lower dose   Zofran  [Ondansetron  Hcl] Nausea Only    Abdominal pain    VACCINATION STATUS: Immunization History  Administered Date(s) Administered   Influenza,inj,Quad PF,6+ Mos 01/13/2019, 01/04/2020   Influenza-Unspecified 11/24/2018, 11/28/2021   PFIZER(Purple Top)SARS-COV-2 Vaccination 06/03/2019, 06/24/2019, 01/10/2020, 01/04/2022   Pneumococcal Polysaccharide-23 10/18/2015   Tdap 06/25/2012    Diabetes He presents for his follow-up diabetic visit. He has type 2 diabetes mellitus. Onset time: Diagnosed at approx age of 67. His disease course has been improving. There are no hypoglycemic associated symptoms. There are no diabetic associated symptoms. There are no hypoglycemic complications. Diabetic complications include heart disease (CHF), nephropathy and PVD. Risk factors for coronary artery disease include diabetes mellitus, male sex, hypertension, dyslipidemia and family history. Current diabetic treatment  includes oral agent (monotherapy) and insulin  injections. He is compliant with treatment most of the time. His weight is fluctuating minimally. He is following a generally healthy diet. Meal planning includes avoidance of concentrated sweets. He has not had a previous visit with a dietitian. He participates in exercise intermittently. His home blood glucose trend is decreasing steadily. His overall blood glucose range is 110-130 mg/dl. (He presents today, with his wife on FaceTime, with his CGM showing at target glycemic profile.  His POCT A1c today is 6.7%, improving from last visit of 7.2%.  Analysis of his CGM shows TIR 93%,  TAR 6%, TBR 1% with a GMI of 6.4%.  He notes he does get alarms at night for low glucose for which he has a tendency to overcorrect. ) An ACE inhibitor/angiotensin II receptor blocker is being taken. He sees a podiatrist.Eye exam is current.     Review of systems  Constitutional: + stable body weight, current Body mass index is 25.77 kg/m., no fatigue, no subjective hyperthermia, no subjective hypothermia Eyes: no blurry vision, no xerophthalmia ENT: no sore throat, no nodules palpated in throat, no dysphagia/odynophagia, no hoarseness Cardiovascular: no chest pain, no shortness of breath, no palpitations, no leg swelling Respiratory: no cough, no shortness of breath Gastrointestinal: no nausea/vomiting/diarrhea Musculoskeletal: no muscle/joint aches Skin: no rashes, no hyperemia, Neurological: no tremors, no numbness, no tingling, no dizziness Psychiatric: no depression, no anxiety  Objective:     BP (!) 154/94 (BP Location: Right Arm, Patient Position: Sitting, Cuff Size: Large) Comment: Retake with Manuel Cuff. Patient states that he has taken his HTN Medication today. States that today stressful. He follows Dr. Frann ROSALEA Folks made aware.  Pulse 76   Ht 5' 10 (1.778 m)   Wt 179 lb 9.6 oz (81.5 kg)   BMI 25.77 kg/m   Wt Readings from Last 3 Encounters:   04/07/23 179 lb 9.6 oz (81.5 kg)  01/07/23 182 lb (82.6 kg)  01/01/23 181 lb 6 oz (82.3 kg)     BP Readings from Last 3 Encounters:  04/07/23 (!) 154/94  01/07/23 (!) 146/88  01/01/23 108/78     Physical Exam- Limited  Constitutional:  Body mass index is 25.77 kg/m. , not in acute distress, normal state of mind Eyes:  EOMI, no exophthalmos Musculoskeletal: no gross deformities, strength intact in all four extremities, no gross restriction of joint movements Skin:  no rashes, no hyperemia Neurological: no tremor with outstretched hands   Diabetic Foot Exam - Simple   No data filed      CMP ( most recent) CMP     Component Value Date/Time   NA 142 01/07/2023 1609   K 4.0 01/07/2023 1609   CL 107 (H) 01/07/2023 1609   CO2 20 01/07/2023 1609   GLUCOSE 165 (H) 01/07/2023 1609   GLUCOSE 105 (H) 07/15/2022 1522   BUN 27 (H) 01/07/2023 1609   CREATININE 1.68 (H) 01/07/2023 1609   CREATININE 1.62 (H) 10/19/2021 1511   CALCIUM  9.3 01/07/2023 1609   PROT 7.9 04/12/2022 0756   ALBUMIN 4.3 04/12/2022 0756   AST 20 04/12/2022 0756   ALT 23 04/12/2022 0756   ALKPHOS 76 04/12/2022 0756   BILITOT 0.6 04/12/2022 0756   GFR 45.95 (L) 04/12/2022 0756   EGFR 47 (L) 01/07/2023 1609   GFRNONAA 53 (L) 07/15/2022 1522     Diabetic Labs (most recent): Lab Results  Component Value Date   HGBA1C 6.7 (A) 04/07/2023   HGBA1C 7.2 (A) 12/06/2022   HGBA1C 7.2 (A) 08/30/2022   MICROALBUR 150mg /L 04/07/2023   MICROALBUR 5.0 (H) 07/18/2020   MICROALBUR 28.5 (H) 05/28/2019     Lipid Panel ( most recent) Lipid Panel     Component Value Date/Time   CHOL 141 04/12/2022 0756   TRIG 53.0 04/12/2022 0756   HDL 45.90 04/12/2022 0756   CHOLHDL 3 04/12/2022 0756   VLDL 10.6 04/12/2022 0756   LDLCALC 85 04/12/2022 0756   LDLCALC 119 (H) 10/19/2021 1511      Lab Results  Component Value Date   TSH 1.280 04/09/2022   TSH 1.16  08/27/2019   TSH 0.82 05/09/2017   FREET4 0.97  08/27/2019   FREET4 1.1 05/09/2017           Assessment & Plan:   1) Type 2 diabetes mellitus with foot ulcer, with long-term current use of insulin  (HCC)  He presents today, with his wife on FaceTime, with his CGM showing at target glycemic profile.  His POCT A1c today is 6.7%, improving from last visit of 7.2%.  Analysis of his CGM shows TIR 93%, TAR 6%, TBR 1% with a GMI of 6.4%.  He notes he does get alarms at night for low glucose for which he has a tendency to overcorrect.   - John [name] has currently uncontrolled symptomatic type 2 DM since 58 years of age.   -Recent labs reviewed.  - I had a long discussion with him about the progressive nature of diabetes and the pathology behind its complications. -his diabetes is complicated by CHF, CKD stage 3a, diabetic foot ulcer and he remains at a high risk for more acute and chronic complications which include CAD, CVA, CKD, retinopathy, and neuropathy. These are all discussed in detail with him.  The following Lifestyle Medicine recommendations according to American College of Lifestyle Medicine [facility]) were discussed and offered to patient and he agrees to start the journey:  A. Whole Foods, Plant-based plate comprising of fruits and vegetables, plant-based proteins, whole-grain carbohydrates was discussed in detail with the patient.   A list for source of those nutrients were also provided to the patient.  Patient will use only water or unsweetened tea for hydration. B.  The need to stay away from risky substances including alcohol, smoking; obtaining 7 to 9 hours of restorative sleep, at least 150 minutes of moderate intensity exercise weekly, the importance of healthy social connections,  and stress reduction techniques were discussed. C.  A full color page of  Calorie density of various food groups per pound showing examples of each food groups was provided to the patient.  - Nutritional counseling repeated at each appointment due to  patients tendency to fall back in to old habits.  - The patient admits there is a room for improvement in their diet and drink choices. -  Suggestion is made for the patient to avoid simple carbohydrates from their diet including Cakes, Sweet Desserts / Pastries, Ice Cream, Soda (diet and regular), Sweet Tea, Candies, Chips, Cookies, Sweet Pastries, Store Bought Juices, Alcohol in Excess of 1-2 drinks a day, Artificial Sweeteners, Coffee Creamer, and Sugar-free Products. This will help patient to have stable blood glucose profile and potentially avoid unintended weight gain.   - I encouraged the patient to switch to unprocessed or minimally processed complex starch and increased protein intake (animal or plant source), fruits, and vegetables.   - Patient is advised to stick to a routine mealtimes to eat 3 meals a day and avoid unnecessary snacks (to snack only to correct hypoglycemia).  - I have approached him with the following individualized plan to manage his diabetes and patient agrees:   - he is advised to lower his Tresiba  to 10 units SQ nightly and continue Farxiga  10 mg po daily.  -he is encouraged to continue monitoring glucose 4 times daily (using his CGM), before meals and before bed, and to call the clinic if he has readings less than 70 or above 300 for 3 tests in a row.  - he is warned not to take insulin  without proper monitoring per orders. - Adjustment parameters  are given to him for hypo and hyperglycemia in writing. - he is encouraged to call clinic for blood glucose levels less than 70 or above 300 mg /dl.  - he is not a candidate for Metformin  due to concurrent renal insufficiency.  - he is not an ideal candidate for incretin therapy due to body habitus.  - Specific targets for  A1c; LDL, HDL, and Triglycerides were discussed with the patient.  2) Blood Pressure /Hypertension:  his blood pressure is NOT controlled to target but he notes a particularly stressful day at  work.   he is advised to continue his current medications including Coreg  25 mg po twice daily, Bidil  20-37.5 mg 1/2 tab po twice daily, Entresto  97-103 mg po twice daily, and Aldactone  25 mg po daily.  I did enter referral to nephrology given decline in kidney function (CKD 3b) to be proactive.  3) Lipids/Hyperlipidemia:    Review of his recent lipid panel from 04/12/22 showed controlled LDL at 85 .  he is advised to continue Lipitor 80 mg daily at bedtime.  Side effects and precautions discussed with him.  Will recheck lipid panel prior to next visit.  4)  Weight/Diet:  his Body mass index is 25.77 kg/m.  -  he is NOT a candidate for weight loss.  Exercise, and detailed carbohydrates information provided  -  detailed on discharge instructions.  5) Chronic Care/Health Maintenance: -he is on ACEI/ARB and Statin medications and is encouraged to initiate and continue to follow up with Ophthalmology, Dentist, Podiatrist at least yearly or according to recommendations, and advised to stay away from smoking. I have recommended yearly flu vaccine and pneumonia vaccine at least every 5 years; moderate intensity exercise for up to 150 minutes weekly; and sleep for at least 7 hours a day.  - he is advised to maintain close follow up with Frann Mabel Mt, DO for primary care needs, as well as his other providers for optimal and coordinated care.     I spent  50  minutes in the care of the patient today including review of labs from CMP, Lipids, Thyroid  Function, Hematology (current and previous including abstractions from other facilities); face-to-face time discussing  his blood glucose readings/logs, discussing hypoglycemia and hyperglycemia episodes and symptoms, medications doses, his options of short and long term treatment based on the latest standards of care / guidelines;  discussion about incorporating lifestyle medicine;  and documenting the encounter. Risk reduction counseling performed per  USPSTF guidelines to reduce obesity and cardiovascular risk factors.     Please refer to Patient Instructions for Blood Glucose Monitoring and Insulin /Medications Dosing Guide  in media tab for additional information. Please  also refer to  Patient Self Inventory in the Media  tab for reviewed elements of pertinent patient history.  John [name] participated in the discussions, expressed understanding, and voiced agreement with the above plans.  All questions were answered to his satisfaction. he is encouraged to contact clinic should he have any questions or concerns prior to his return visit.     Follow up plan: - Return in about 3 months (around 07/06/2023) for Diabetes F/U with A1c in office.  Benton Rio, [facility] [address] [place], [place] [address] Phone: [phone] Fax: [phone]  04/07/2023, 3:58 PM

## 2023-04-25 ENCOUNTER — Other Ambulatory Visit (HOSPITAL_COMMUNITY): Payer: Self-pay | Admitting: Cardiology

## 2023-04-25 ENCOUNTER — Other Ambulatory Visit (HOSPITAL_COMMUNITY): Payer: Self-pay

## 2023-04-25 MED ORDER — ATORVASTATIN CALCIUM 80 MG PO TABS
80.0000 mg | ORAL_TABLET | Freq: Every day | ORAL | 0 refills | Status: DC
  Filled 2023-04-25: qty 90, 90d supply, fill #0

## 2023-04-28 ENCOUNTER — Encounter: Payer: Self-pay | Admitting: Podiatry

## 2023-04-28 ENCOUNTER — Ambulatory Visit: Payer: Commercial Managed Care - PPO | Admitting: Podiatry

## 2023-04-28 ENCOUNTER — Ambulatory Visit (INDEPENDENT_AMBULATORY_CARE_PROVIDER_SITE_OTHER): Payer: Commercial Managed Care - PPO

## 2023-04-28 DIAGNOSIS — M778 Other enthesopathies, not elsewhere classified: Secondary | ICD-10-CM | POA: Diagnosis not present

## 2023-04-28 DIAGNOSIS — T84498D Other mechanical complication of other internal orthopedic devices, implants and grafts, subsequent encounter: Secondary | ICD-10-CM | POA: Diagnosis not present

## 2023-04-28 DIAGNOSIS — L97512 Non-pressure chronic ulcer of other part of right foot with fat layer exposed: Secondary | ICD-10-CM | POA: Diagnosis not present

## 2023-04-28 NOTE — Progress Notes (Unsigned)
Chief Complaint  Patient presents with   Routine Post Op    Patient states everything has been good since last visit    Subjective:  Patient presents today status post right great toe IPJ arthrodesis.  DOS: 11/21/2022.  This patient doing well.  WBAT surgical shoe.  Overall he is doing very well with no new complaints at this time  Past Medical History:  Diagnosis Date   Asthma    CAD in native artery    CHF (congestive heart failure) (HCC)    Chronic HFrEF (heart failure with reduced ejection fraction) (HCC)    CKD (chronic kidney disease) stage 2, GFR 60-89 ml/min    Diabetic foot ulcer (HCC)    History of chicken pox    History of DVT (deep vein thrombosis)    s/p trauma of R leg -- 2014. No other history of DVT   History of kidney stones    Hypertension    Lazy eye of left side    NICM (nonischemic cardiomyopathy) (HCC)    Pneumonia    Uncontrolled diabetes mellitus with hyperglycemia [facility])     Past Surgical History:  Procedure Laterality Date   AMPUTATION TOE Left 05/16/2021   Procedure: AMPUTATION LEFT GREAT  TOE;  Surgeon: Vivi Barrack, DPM;  [facility];  Service: Podiatry;  Laterality: Left;   BONE BIOPSY Right 05/10/2022   Procedure: BONE BIOPSY;  Surgeon: Felecia Shelling, DPM;  [facility];  Service: Podiatry;  Laterality: Right;   EXOSTECTECTOMY TOE Right 05/10/2022   Procedure: EXOSTECTECTOMY TOE;  Surgeon: Felecia Shelling, DPM;  [facility];  Service: Podiatry;  Laterality: Right;   EYE SURGERY Right    cataract surgery  w/ IOL   RIGHT/LEFT HEART CATH AND CORONARY ANGIOGRAPHY N/A 03/11/2022   Procedure: RIGHT/LEFT HEART CATH AND CORONARY ANGIOGRAPHY;  Surgeon: Yvonne Kendall, MD;  [facility];  Service: Cardiovascular;  Laterality: N/A;    Allergies  Allergen Reactions   Bidil [Isosorb Dinitrate-Hydralazine] Other (See Comments)    Dropped BP - but has tolerated lower dose   Zofran [Ondansetron Hcl] Nausea Only     Abdominal pain    RT great toe 01/22/2023  RT great toe 01/22/2023  Objective/Physical Exam Neurovascular status intact.  Incision nicely healed.  The wound to the plantar aspect of the toe has healed and resolved completely.  There is no erythema or edema around the toe.  Overall successful surgery with well-healed toe  Radiographic Exam RT foot 01/22/2023:  Unchanged.  Rectus alignment of the great toe.  The orthopedic screw does appear to be plantar to the distal phalanx.  There must be some stability at the IPJ and it appears that the screw does cross the base of the proximal phalanx stabilizing the joint.  Assessment: 1. s/p IPJ arthrodesis right great toe. DOS: 11/27/2022 2.  Pain due to onychomycosis of toenails both 3.  Diabetes mellitus with peripheral polyneuropathy 4.  Preulcerative callus lesions left  Plan of Care:  -Patient was evaluated.  X-rays reviewed -Although there is mild placement of the orthopedic screw currently the toe looks great without any complaints or complications.  Will plan to simply observe for now -Patient may return to work full activity no restrictions -Continue diabetic shoes and insoles -Excisional debridement of the preulcerative callus tissue was performed to the left foot today without incident or bleeding -Return to clinic 3 months routine footcare and follow-up x-rays of the right foot  Kipp Brood  Arlyce Dice, DPM [facility]  Dr. Felecia Shelling, DPM    [address] [place], [place] [address]                Office [phone]  Fax [phone]

## 2023-04-29 ENCOUNTER — Other Ambulatory Visit (HOSPITAL_COMMUNITY): Payer: Self-pay

## 2023-04-29 ENCOUNTER — Ambulatory Visit: Payer: Commercial Managed Care - PPO | Admitting: Podiatry

## 2023-04-29 ENCOUNTER — Encounter: Payer: Self-pay | Admitting: Podiatry

## 2023-04-29 DIAGNOSIS — T84498D Other mechanical complication of other internal orthopedic devices, implants and grafts, subsequent encounter: Secondary | ICD-10-CM | POA: Diagnosis not present

## 2023-04-29 MED ORDER — DOXYCYCLINE HYCLATE 100 MG PO TABS
100.0000 mg | ORAL_TABLET | Freq: Two times a day (BID) | ORAL | 0 refills | Status: DC
  Filled 2023-04-29: qty 20, 10d supply, fill #0

## 2023-04-29 NOTE — Progress Notes (Signed)
 Chief Complaint  Patient presents with   Wound Check    You're going to pull the screw out.    Subjective:  Patient presents today status post right great toe IPJ arthrodesis.  DOS: 11/21/2022.  Presenting today to have the orthopedic screw removed from the distal tip of the toe.  Past Medical History:  Diagnosis Date   Asthma    CAD in native artery    CHF (congestive heart failure) (HCC)    Chronic HFrEF (heart failure with reduced ejection fraction) (HCC)    CKD (chronic kidney disease) stage 2, GFR 60-89 ml/min    Diabetic foot ulcer (HCC)    History of chicken pox    History of DVT (deep vein thrombosis)    s/p trauma of R leg -- 2014. No other history of DVT   History of kidney stones    Hypertension    Lazy eye of left side    NICM (nonischemic cardiomyopathy) (HCC)    Pneumonia    Uncontrolled diabetes mellitus with hyperglycemia [facility])     Past Surgical History:  Procedure Laterality Date   AMPUTATION TOE Left 05/16/2021   Procedure: AMPUTATION LEFT GREAT  TOE;  Surgeon: Gershon Donnice SAUNDERS, DPM;  [facility];  Service: Podiatry;  Laterality: Left;   BONE BIOPSY Right 05/10/2022   Procedure: BONE BIOPSY;  Surgeon: Janit Thresa HERO, DPM;  [facility];  Service: Podiatry;  Laterality: Right;   EXOSTECTECTOMY TOE Right 05/10/2022   Procedure: EXOSTECTECTOMY TOE;  Surgeon: Janit Thresa HERO, DPM;  [facility];  Service: Podiatry;  Laterality: Right;   EYE SURGERY Right    cataract surgery  w/ IOL   RIGHT/LEFT HEART CATH AND CORONARY ANGIOGRAPHY N/A 03/11/2022   Procedure: RIGHT/LEFT HEART CATH AND CORONARY ANGIOGRAPHY;  Surgeon: Mady Bruckner, MD;  [facility];  Service: Cardiovascular;  Laterality: N/A;    Allergies  Allergen Reactions   Bidil  [Isosorb Dinitrate-Hydralazine ] Other (See Comments)    Dropped BP - but has tolerated lower dose   Zofran  [Ondansetron  Hcl] Nausea Only    Abdominal pain    RT great toe 01/22/2023  RT  great toe 01/22/2023  Objective/Physical Exam Vascular status intact.  Complete neuropathy with loss of sensation and protective threshold noted to the feet bilateral. Stable.  Unchanged from yesterday  Radiographic Exam RT foot 04/28/2023:  Increased loss of bone purchase to the distal phalanx of the great toe.  The orthopedic screw was actually plantigrade to the phalanx.  It is well-seated and embedded within the proximal phalanx.  Dorsal subluxation of the IPJ arthrodesis site  Assessment: 1. s/p IPJ arthrodesis right great toe. DOS: 11/27/2022 2.   Diabetes mellitus with peripheral polyneuropathy 3.  Ulcer left great toe with fat layer exposed as well as the orthopedic screw head  Plan of Care:  -Patient was evaluated.  -The orthopedic screw protruding out of the tip of the toe was removed today without complication -Dressings applied -Patient has Betadine at home.  Recommend Betadine with a light dressing daily -Prescription for doxycycline  100 mg 2 times daily #20 -Return to clinic in 2 weeks  Thresa EMERSON Janit, DPM [facility]  Dr. Thresa EMERSON Janit, DPM    [address]                                    [place], [place]  [address]                Office [phone]  Fax [phone]

## 2023-05-02 ENCOUNTER — Other Ambulatory Visit: Payer: Self-pay | Admitting: Cardiology

## 2023-05-02 ENCOUNTER — Other Ambulatory Visit: Payer: Self-pay | Admitting: Family Medicine

## 2023-05-06 ENCOUNTER — Other Ambulatory Visit (HOSPITAL_COMMUNITY): Payer: Self-pay

## 2023-05-07 ENCOUNTER — Other Ambulatory Visit (HOSPITAL_COMMUNITY): Payer: Self-pay

## 2023-05-07 MED ORDER — SACUBITRIL-VALSARTAN 97-103 MG PO TABS
1.0000 | ORAL_TABLET | Freq: Two times a day (BID) | ORAL | 0 refills | Status: DC
  Filled 2023-05-07: qty 60, 30d supply, fill #0

## 2023-05-12 ENCOUNTER — Other Ambulatory Visit (HOSPITAL_COMMUNITY): Payer: Self-pay

## 2023-05-12 ENCOUNTER — Ambulatory Visit: Payer: Commercial Managed Care - PPO | Admitting: Podiatry

## 2023-05-12 ENCOUNTER — Encounter: Payer: Self-pay | Admitting: Podiatry

## 2023-05-12 DIAGNOSIS — L97512 Non-pressure chronic ulcer of other part of right foot with fat layer exposed: Secondary | ICD-10-CM

## 2023-05-12 MED ORDER — SILVER SULFADIAZINE 1 % EX CREA
1.0000 | TOPICAL_CREAM | Freq: Every day | CUTANEOUS | 0 refills | Status: DC
  Filled 2023-05-12: qty 50, 30d supply, fill #0

## 2023-05-12 NOTE — Progress Notes (Signed)
Chief Complaint  Patient presents with   Routine Post Op    Patient states that everything has been ok since last visit no discomfort     Subjective:  Patient presents today status post right great toe IPJ arthrodesis.  DOS: 11/21/2022 with subsequent removal of orthopedic screw to the right great toe performed in the office on 04/29/2023.  Patient doing well.  No new complaints  Past Medical History:  Diagnosis Date   Asthma    CAD in native artery    CHF (congestive heart failure) (HCC)    Chronic HFrEF (heart failure with reduced ejection fraction) (HCC)    CKD (chronic kidney disease) stage 2, GFR 60-89 ml/min    Diabetic foot ulcer (HCC)    History of chicken pox    History of DVT (deep vein thrombosis)    s/p trauma of R leg -- 2014. No other history of DVT   History of kidney stones    Hypertension    Lazy eye of left side    NICM (nonischemic cardiomyopathy) (HCC)    Pneumonia    Uncontrolled diabetes mellitus with hyperglycemia [facility])     Past Surgical History:  Procedure Laterality Date   AMPUTATION TOE Left 05/16/2021   Procedure: AMPUTATION LEFT GREAT  TOE;  Surgeon: Vivi Barrack, DPM;  Location: [facility] OR;  Service: Podiatry;  Laterality: Left;   BONE BIOPSY Right 05/10/2022   Procedure: BONE BIOPSY;  Surgeon: Felecia Shelling, DPM;  Location: [facility] ORS;  Service: Podiatry;  Laterality: Right;   EXOSTECTECTOMY TOE Right 05/10/2022   Procedure: EXOSTECTECTOMY TOE;  Surgeon: Felecia Shelling, DPM;  Location: [facility] ORS;  Service: Podiatry;  Laterality: Right;   EYE SURGERY Right    cataract surgery  w/ IOL   RIGHT/LEFT HEART CATH AND CORONARY ANGIOGRAPHY N/A 03/11/2022   Procedure: RIGHT/LEFT HEART CATH AND CORONARY ANGIOGRAPHY;  Surgeon: Yvonne Kendall, MD;  Location: [facility] INVASIVE CV LAB;  Service: Cardiovascular;  Laterality: N/A;    Allergies  Allergen Reactions   Bidil [Isosorb Dinitrate-Hydralazine] Other (See Comments)    Dropped BP - but has tolerated lower dose    Zofran [Ondansetron Hcl] Nausea Only    Abdominal pain    RT great toe 01/22/2023  RT great toe 01/22/2023  Objective/Physical Exam Vascular status intact.  Callused area to the plantar aspect of the right great toe.  The wound to the distal tip of the toe where the orthopedic screw was removed has healed and resolved completely.  Radiographic Exam RT foot 01/22/2023:  Unchanged.  Rectus alignment of the great toe.  The orthopedic screw does appear to be plantar to the distal phalanx.  There must be some stability at the IPJ and it appears that the screw does cross the base of the proximal phalanx stabilizing the joint.  Assessment: 1. s/p IPJ arthrodesis right great toe. DOS: 11/27/2022.  Subsequent ROH in office.  04/29/2023 2.  Diabetes mellitus with peripheral polyneuropathy 4.  Preulcerative callus lesion right great toe  Plan of Care:  -Patient was evaluated.  -Excisional debridement of the callus area was performed today using a 312 scalpel to the right great toe.  The exit wound from the screw is completely healed and resolved -Continue light dressing when going to work -The skin is very dry so prescription for Silvadene cream was sent to the pharmacy to apply daily -Return to clinic 2 months  Felecia Shelling, DPM [facility]  Dr. Larena Glassman.  Logan Bores, DPM    [address] [place], [place] [address]                Office [phone]  Fax [phone]

## 2023-05-12 NOTE — Addendum Note (Signed)
Addended by: Felecia Shelling on: 05/12/2023 01:41 PM   Modules accepted: Orders

## 2023-05-27 ENCOUNTER — Other Ambulatory Visit (HOSPITAL_COMMUNITY): Payer: Self-pay

## 2023-05-27 ENCOUNTER — Ambulatory Visit: Admitting: Family Medicine

## 2023-05-27 ENCOUNTER — Encounter: Payer: Self-pay | Admitting: Family Medicine

## 2023-05-27 VITALS — BP 154/78 | HR 79 | Resp 18 | Ht 70.0 in | Wt 176.0 lb

## 2023-05-27 DIAGNOSIS — I10 Essential (primary) hypertension: Secondary | ICD-10-CM

## 2023-05-27 DIAGNOSIS — R300 Dysuria: Secondary | ICD-10-CM | POA: Diagnosis not present

## 2023-05-27 LAB — POCT URINALYSIS DIPSTICK
Bilirubin, UA: NEGATIVE
Glucose, UA: POSITIVE — AB
Ketones, UA: NEGATIVE
Nitrite, UA: NEGATIVE
Protein, UA: POSITIVE — AB
Spec Grav, UA: 1.01 (ref 1.010–1.025)
Urobilinogen, UA: 0.2 U/dL
pH, UA: 5 (ref 5.0–8.0)

## 2023-05-27 MED ORDER — AMLODIPINE BESYLATE 5 MG PO TABS
5.0000 mg | ORAL_TABLET | Freq: Every day | ORAL | 3 refills | Status: DC
  Filled 2023-05-27: qty 30, 30d supply, fill #0

## 2023-05-27 MED ORDER — SULFAMETHOXAZOLE-TRIMETHOPRIM 800-160 MG PO TABS
1.0000 | ORAL_TABLET | Freq: Two times a day (BID) | ORAL | 0 refills | Status: AC
  Filled 2023-05-27: qty 14, 7d supply, fill #0

## 2023-05-27 NOTE — Progress Notes (Signed)
 Chief Complaint  Patient presents with   Dysuria    John [name] is a 57 y.o. male here for possible UTI.  Duration: 3 days. Symptoms: Dysuria, urinary frequency, urinary retention, and urgency Denies: hematuria, urinary hesitancy, fever, nausea, vomiting, flank pain, discharge Hx of recurrent UTI? Yes Denies new sexual partners. Does not practice insertive anal intercourse.   Hypertension Patient presents for hypertension follow up. He does monitor home blood pressures. Blood pressures ranging on average from 140's/80-90's. He is compliant with medications- Entresto and Bidil from cards, Coreg 25 mg bid, Aldactone 25 mg/d. Patient has these side effects of medication: none He is adhering to a healthy diet overall. Exercise: active at work No CP or SOB.   Past Medical History:  Diagnosis Date   Asthma    CAD in native artery    CHF (congestive heart failure) (HCC)    Chronic HFrEF (heart failure with reduced ejection fraction) (HCC)    CKD (chronic kidney disease) stage 2, GFR 60-89 ml/min    Diabetic foot ulcer (HCC)    History of chicken pox    History of DVT (deep vein thrombosis)    s/p trauma of R leg -- 2014. No other history of DVT   History of kidney stones    Hypertension    Lazy eye of left side    NICM (nonischemic cardiomyopathy) (HCC)    Pneumonia    Uncontrolled diabetes mellitus with hyperglycemia (HCC)      BP (!) 154/78 (BP Location: Right Arm, Cuff Size: Normal)   Pulse 79   Resp 18   Ht 5\' 10"  (1.778 m)   Wt 176 lb (79.8 kg)   SpO2 98%   BMI 25.25 kg/m  General: Awake, alert, appears stated age Heart: RRR, no LE edema, no bruits Lungs: CTAB, normal respiratory effort, no accessory muscle usage Abd: BS+, soft, NT, ND, no masses or organomegaly MSK: No CVA tenderness, neg Lloyd's sign Psych: Age appropriate judgment and insight  Dysuria - Plan: Urine Culture, Urine Microalbumin w/creat. ratio, POCT Urinalysis Dipstick  Essential  hypertension  UA suggestive of infection.  7-day course of Bactrim twice daily.  Stay hydrated. Seek immediate care if pt starts to develop fevers, new/worsening symptoms, uncontrollable N/V. Chronic, uncontrolled.  He is going to continue Entresto and BiDil from cardiology, Coreg 25 mg twice daily, spironolactone 25 mg daily.  Had amlodipine 5 mg daily.  He has a follow-up appointment with the cardiology team next month.  He will follow-up with them.  Could consider thiazide diuretic. The patient voiced understanding and agreement to the plan.  Jilda Roche [place], DO 05/27/23 3:48 PM

## 2023-05-27 NOTE — Patient Instructions (Signed)
 Stay hydrated.   Warning signs/symptoms: Uncontrollable nausea/vomiting, fevers, worsening symptoms despite treatment, confusion.  Give [place] around 2 business days to get culture back to you.  Check your blood pressures 2-3 times per week, alternating the time of day you check it. If it is high, considering waiting 1-2 minutes and rechecking. If it gets higher, your anxiety is likely creeping up and we should avoid rechecking.   Keep the diet clean and stay active.  Let us know if you need anything.

## 2023-05-28 ENCOUNTER — Encounter: Payer: Self-pay | Admitting: Family Medicine

## 2023-05-28 LAB — MICROALBUMIN / CREATININE URINE RATIO
Creatinine,U: 53.3 mg/dL
Microalb Creat Ratio: 781.8 mg/g — ABNORMAL HIGH (ref 0.0–30.0)
Microalb, Ur: 41.7 mg/dL — ABNORMAL HIGH (ref 0.0–1.9)

## 2023-05-29 LAB — URINE CULTURE
MICRO NUMBER:: 16156613
SPECIMEN QUALITY:: ADEQUATE

## 2023-06-05 DIAGNOSIS — I129 Hypertensive chronic kidney disease with stage 1 through stage 4 chronic kidney disease, or unspecified chronic kidney disease: Secondary | ICD-10-CM | POA: Diagnosis not present

## 2023-06-05 DIAGNOSIS — D631 Anemia in chronic kidney disease: Secondary | ICD-10-CM | POA: Diagnosis not present

## 2023-06-05 DIAGNOSIS — E785 Hyperlipidemia, unspecified: Secondary | ICD-10-CM | POA: Diagnosis not present

## 2023-06-05 DIAGNOSIS — E1122 Type 2 diabetes mellitus with diabetic chronic kidney disease: Secondary | ICD-10-CM | POA: Diagnosis not present

## 2023-06-05 DIAGNOSIS — N1831 Chronic kidney disease, stage 3a: Secondary | ICD-10-CM | POA: Diagnosis not present

## 2023-06-09 LAB — LAB REPORT - SCANNED
Albumin, Urine POC: 120.6
Calcium: 9.6
Creatinine, POC: 57.8 mg/dL
EGFR: 57
PTH: 24

## 2023-06-10 ENCOUNTER — Other Ambulatory Visit (HOSPITAL_COMMUNITY): Payer: Self-pay

## 2023-06-10 ENCOUNTER — Other Ambulatory Visit (HOSPITAL_COMMUNITY): Payer: Self-pay | Admitting: Cardiology

## 2023-06-10 ENCOUNTER — Other Ambulatory Visit: Payer: Self-pay

## 2023-06-10 MED ORDER — AMLODIPINE BESYLATE 10 MG PO TABS
10.0000 mg | ORAL_TABLET | Freq: Every day | ORAL | 3 refills | Status: DC
  Filled 2023-06-10: qty 90, 90d supply, fill #0

## 2023-06-12 ENCOUNTER — Other Ambulatory Visit (HOSPITAL_COMMUNITY): Payer: Self-pay

## 2023-06-12 MED ORDER — SPIRONOLACTONE 25 MG PO TABS
25.0000 mg | ORAL_TABLET | Freq: Every day | ORAL | 3 refills | Status: AC
  Filled 2023-06-12 – 2023-06-24 (×2): qty 90, 90d supply, fill #0
  Filled 2023-09-15: qty 90, 90d supply, fill #1
  Filled 2023-12-22: qty 90, 90d supply, fill #2
  Filled 2024-03-18 – 2024-04-02 (×2): qty 90, 90d supply, fill #3

## 2023-06-18 ENCOUNTER — Other Ambulatory Visit: Payer: Self-pay | Admitting: Cardiology

## 2023-06-18 ENCOUNTER — Telehealth (HOSPITAL_COMMUNITY): Payer: Self-pay | Admitting: Cardiology

## 2023-06-18 ENCOUNTER — Other Ambulatory Visit (HOSPITAL_COMMUNITY): Payer: Self-pay

## 2023-06-18 MED ORDER — ENTRESTO 97-103 MG PO TABS
1.0000 | ORAL_TABLET | Freq: Two times a day (BID) | ORAL | 0 refills | Status: DC
  Filled 2023-06-18: qty 60, 30d supply, fill #0

## 2023-06-18 NOTE — Telephone Encounter (Signed)
 Pt overdue for follow up [facility] to schedule appt Refill sent Mychart letter sent

## 2023-06-18 NOTE — Telephone Encounter (Signed)
 Front office left patient a voice mail to schedule a follow up appt with Dr. Gasper Lloyd. Patient left a voice mail on front office telephone in regards to scheduling an appt with Dr. Gasper Lloyd.

## 2023-06-19 ENCOUNTER — Telehealth: Payer: Self-pay

## 2023-06-19 NOTE — Telephone Encounter (Signed)
 Initial Comment Caller recently had lab work done that showed he was anemic. He feels lightheaded with a headache and wants some iron pills. Translation No Nurse Assessment Nurse: Jearld Fenton, RN, Alvy Beal Date/Time Lamount Cohen Time): 06/19/2023 9:31:49 AM Confirm and document reason for call. If symptomatic, describe symptoms. ---Caller states has a headache and lightheadness onset yesterday. Denies N/V. Does have mild dizziness. Lab work was done and seen kidney doctor last week. Does the patient have any new or worsening symptoms? ---Yes Will a triage be completed? ---Yes Related visit to physician within the last 2 weeks? ---Yes Does the PT have any chronic conditions? (i.e. diabetes, asthma, this includes High risk factors for pregnancy, etc.) ---Yes List chronic conditions. ---Kidney problems, DM, HTN Is this a behavioral health or substance abuse call? ---No Guidelines Guideline Title Affirmed Question Affirmed Notes Nurse Date/Time (Eastern Time) Headache [1] New-onset headache AND [2] age > 99 years Lyna Poser 06/19/2023 9:34:13 AM Disp. Time Lamount Cohen Time) Disposition Final User 06/19/2023 9:41:45 AM SEE PCP WITHIN 3 DAYS Yes Jearld Fenton RN, Alvy Beal Final Disposition 06/19/2023 9:41:45 AM SEE PCP WITHIN 3 DAYS Yes Jearld Fenton, RN, Alvy Beal PLEASE NOTE: All timestamps contained within this report are represented as [place] Standard Time. CONFIDENTIALTY NOTICE: This fax transmission is intended only for the addressee. It contains information that is legally privileged, confidential or otherwise protected from use or disclosure. If you are not the intended recipient, you are strictly prohibited from reviewing, disclosing, copying using or disseminating any of this information or taking any action in reliance on or regarding this information. If you have received this fax in error, please notify us immediately by telephone so that we can arrange for its return to [place]. Phone: [phone],  Toll-Free: [phone], Fax: [phone] John [name] 1966-02-05 Page: 2 of 2 CallId: [id number] Caller Disagree/Comply Comply Caller Understands Yes PreDisposition Call Doctor Care Advice Given Per Guideline SEE PCP WITHIN 3 DAYS: * You need to be seen within 2 or 3 days. * PCP VISIT: Call your doctor (or NP/PA) during regular office hours and make an appointment. A clinic or urgent care center are good places to go for care if your doctor's office is closed or you can't get an appointment. NOTE: If office will be open tomorrow, tell caller to call then, not in 3 days. PAIN MEDICINES: * For pain relief, you can take either acetaminophen, ibuprofen, or naproxen. * ACETAMINOPHEN - EXTRA STRENGTH TYLENOL: Take 1,000 mg (two 500 mg pills) every 6 to 8 hours as needed. Each Extra Strength Tylenol pill has 500 mg of acetaminophen. The most you should take is 6 pills a day (3,000 mg total). Note: In [place], the maximum is 8 pills a day (4,000 mg total). * ACETAMINOPHEN - REGULAR STRENGTH TYLENOL: Take 650 mg (two 325 mg pills) by mouth every 4 to 6 hours as needed. Each Regular Strength Tylenol pill has 325 mg of acetaminophen. The most you should take is 10 pills a day (3,250 mg total). Note: In [place], the maximum is 12 pills a day (3,900 mg total). * They are over-the-counter (OTC) pain drugs. You can buy them at the drugstore. * IBUPROFEN (SUCH AS MOTRIN, ADVIL): Take 400 mg (two 200 mg pills) by mouth every 6 hours. The most you should take is 6 pills a day (1,200 mg total). REST FOR HEADACHE: * Lie down in a dark quiet place and relax until feeling better. * Close your eyes and imagine your entire body relaxing. COLD PACK FOR HEADACHE: * Put a  cold pack or an ice bag (wrapped in a moist towel) on the forehead. * Do this for 20 minutes. CALL BACK IF: * Severe headache lasts over 2 hours after pain medicineModerate headache lasts over 24 hours * Blurred vision or double vision occurs * Numbness or  weakness of the face, arm or leg * Difficulty with speaking * You become worse CARE ADVICE given per Headache (Adult) guideline. Comments User: Blanch Media, RN Date/Time Lamount Cohen Time): 06/19/2023 9:33:44 AM c/o fatigue. User: Blanch Media, RN Date/Time Lamount Cohen Time): 06/19/2023 9:35:12 AM Feels like in fog and dizzy User: Blanch Media, RN Date/Time Lamount Cohen Time): 06/19/2023 9:36:18 AM Rates pain 2/10 User: Blanch Media, RN Date/Time (Eastern Time): 06/19/2023 9:38:25 AM Lightheadness is difficulty to work with d/t feeling like sugar is low but sugar is 90. User: Blanch Media, RN Date/Time Lamount Cohen Time): 06/19/2023 9:40:02 AM Appointment made for Monday 1:45pm

## 2023-06-23 ENCOUNTER — Ambulatory Visit: Admitting: Family Medicine

## 2023-06-23 ENCOUNTER — Encounter: Payer: Self-pay | Admitting: Family Medicine

## 2023-06-23 VITALS — BP 132/76 | HR 63 | Ht 70.0 in | Wt 168.8 lb

## 2023-06-23 DIAGNOSIS — I10 Essential (primary) hypertension: Secondary | ICD-10-CM

## 2023-06-23 DIAGNOSIS — D649 Anemia, unspecified: Secondary | ICD-10-CM | POA: Diagnosis not present

## 2023-06-23 NOTE — Patient Instructions (Addendum)
 Keep the diet clean and stay active.  Aim to do some physical exertion for 150 minutes per week. This is typically divided into 5 days per week, 30 minutes per day. The activity should be enough to get your heart rate up. Anything is better than nothing if you have time constraints.  Keep an eye on your blood pressure at home.   I want your blood pressure less than 140 on the top and less than 90 on the bottom consistently. Both goals must be met (ie, 150/70 is too high even though the 70 on the bottom is desirable).   Give [place] 2-3 business days to get the results of your labs back.   Let us know if you need anything.

## 2023-06-23 NOTE — Progress Notes (Signed)
 Chief Complaint  Patient presents with   Hypertension    Patient presents today for hypertension follow-up. He stopped taking the amlodipine due to side effects.    Subjective John [name] is a 58 y.o. male who presents for hypertension follow up. He does not monitor home blood pressures. He is compliant with medications- Coreg 25 mg bid, Entresto 97-103 mg bid, Aldactone 25 mg/d, Bidil 10-18.75 mg bid. Patient has these side effects of medication: none He is sometimes adhering to a healthy diet overall. Current exercise: active at work No CP or SOB.    Past Medical History:  Diagnosis Date   Asthma    CAD in native artery    CHF (congestive heart failure) (HCC)    Chronic HFrEF (heart failure with reduced ejection fraction) (HCC)    CKD (chronic kidney disease) stage 2, GFR 60-89 ml/min    Diabetic foot ulcer (HCC)    History of chicken pox    History of DVT (deep vein thrombosis)    s/p trauma of R leg -- 2014. No other history of DVT   History of kidney stones    Hypertension    Lazy eye of left side    NICM (nonischemic cardiomyopathy) (HCC)    Pneumonia    Uncontrolled diabetes mellitus with hyperglycemia (HCC)     Exam BP 132/76 (BP Location: Left Arm, Cuff Size: Normal)   Pulse 63   Ht 5\' 10"  (1.778 m)   Wt 168 lb 12.8 oz (76.6 kg)   SpO2 97%   BMI 24.22 kg/m  General:  well developed, well nourished, in no apparent distress Heart: RRR, no bruits, no LE edema Lungs: clear to auscultation, no accessory muscle use Psych: well oriented with normal range of affect and appropriate judgment/insight  Essential hypertension  Anemia, unspecified type - Plan: CBC w/Diff, IBC + Ferritin  Chronic, stable. Cont Bidil 1/2 tab of 20-37.5 mg/d, Entresto 97-103 mg/d, Coreg 25 mg bid. Counseled on diet and exercise.  He probably has a component of whitecoat syndrome as he reportedly was nervous.  I checked his blood pressure several more times and each time it decreased.   Given his report of lightheadedness with the new medication, we will stop it (amlodipine) and have him monitor his blood pressure at home.  If it stays under reasonable level, this will confirm our suspicion. History of anemia, we will recheck this today to make sure he does not have any iron deficiency. F/u in 6 mo. The patient voiced understanding and agreement to the plan.  Jilda Roche [place], DO 06/23/23  3:08 PM

## 2023-06-24 ENCOUNTER — Encounter: Payer: Self-pay | Admitting: Family Medicine

## 2023-06-24 ENCOUNTER — Other Ambulatory Visit (HOSPITAL_COMMUNITY): Payer: Self-pay

## 2023-06-24 LAB — CBC WITH DIFFERENTIAL/PLATELET
Basophils Absolute: 0.1 10*3/uL (ref 0.0–0.1)
Basophils Relative: 1.1 % (ref 0.0–3.0)
Eosinophils Absolute: 0.1 10*3/uL (ref 0.0–0.7)
Eosinophils Relative: 2.2 % (ref 0.0–5.0)
HCT: 39.1 % (ref 39.0–52.0)
Hemoglobin: 12.9 g/dL — ABNORMAL LOW (ref 13.0–17.0)
Lymphocytes Relative: 37.7 % (ref 12.0–46.0)
Lymphs Abs: 2 10*3/uL (ref 0.7–4.0)
MCHC: 32.9 g/dL (ref 30.0–36.0)
MCV: 95.4 fl (ref 78.0–100.0)
Monocytes Absolute: 0.5 10*3/uL (ref 0.1–1.0)
Monocytes Relative: 9.1 % (ref 3.0–12.0)
Neutro Abs: 2.7 10*3/uL (ref 1.4–7.7)
Neutrophils Relative %: 49.9 % (ref 43.0–77.0)
Platelets: 135 10*3/uL — ABNORMAL LOW (ref 150.0–400.0)
RBC: 4.1 Mil/uL — ABNORMAL LOW (ref 4.22–5.81)
RDW: 13.5 % (ref 11.5–15.5)
WBC: 5.4 10*3/uL (ref 4.0–10.5)

## 2023-06-24 LAB — IBC + FERRITIN
Ferritin: 92.3 ng/mL (ref 22.0–322.0)
Iron: 102 ug/dL (ref 42–165)
Saturation Ratios: 33.1 % (ref 20.0–50.0)
TIBC: 308 ug/dL (ref 250.0–450.0)
Transferrin: 220 mg/dL (ref 212.0–360.0)

## 2023-06-25 ENCOUNTER — Other Ambulatory Visit: Payer: Self-pay

## 2023-06-25 ENCOUNTER — Other Ambulatory Visit

## 2023-06-25 DIAGNOSIS — E538 Deficiency of other specified B group vitamins: Secondary | ICD-10-CM

## 2023-06-25 DIAGNOSIS — D649 Anemia, unspecified: Secondary | ICD-10-CM | POA: Diagnosis not present

## 2023-06-25 NOTE — Progress Notes (Unsigned)
 John [name]

## 2023-06-26 ENCOUNTER — Encounter: Payer: Self-pay | Admitting: Family Medicine

## 2023-06-27 ENCOUNTER — Other Ambulatory Visit: Payer: Self-pay | Admitting: *Deleted

## 2023-06-27 DIAGNOSIS — D649 Anemia, unspecified: Secondary | ICD-10-CM

## 2023-06-29 LAB — LACTATE DEHYDROGENASE: LDH: 140 U/L (ref 120–250)

## 2023-06-29 LAB — TEST AUTHORIZATION

## 2023-06-29 LAB — VITAMIN B12: Vitamin B-12: 725 pg/mL (ref 200–1100)

## 2023-07-09 ENCOUNTER — Encounter: Payer: Self-pay | Admitting: Nurse Practitioner

## 2023-07-09 ENCOUNTER — Ambulatory Visit: Payer: Commercial Managed Care - PPO | Admitting: Nurse Practitioner

## 2023-07-09 ENCOUNTER — Other Ambulatory Visit (HOSPITAL_COMMUNITY): Payer: Self-pay

## 2023-07-09 VITALS — BP 132/80 | HR 89 | Ht 70.0 in | Wt 170.2 lb

## 2023-07-09 DIAGNOSIS — E1122 Type 2 diabetes mellitus with diabetic chronic kidney disease: Secondary | ICD-10-CM | POA: Diagnosis not present

## 2023-07-09 DIAGNOSIS — E1165 Type 2 diabetes mellitus with hyperglycemia: Secondary | ICD-10-CM | POA: Diagnosis not present

## 2023-07-09 DIAGNOSIS — Z794 Long term (current) use of insulin: Secondary | ICD-10-CM

## 2023-07-09 DIAGNOSIS — N1831 Chronic kidney disease, stage 3a: Secondary | ICD-10-CM

## 2023-07-09 DIAGNOSIS — Z7984 Long term (current) use of oral hypoglycemic drugs: Secondary | ICD-10-CM | POA: Diagnosis not present

## 2023-07-09 LAB — POCT GLYCOSYLATED HEMOGLOBIN (HGB A1C): Hemoglobin A1C: 7 % — AB (ref 4.0–5.6)

## 2023-07-09 MED ORDER — DAPAGLIFLOZIN PROPANEDIOL 10 MG PO TABS
10.0000 mg | ORAL_TABLET | Freq: Every day | ORAL | 3 refills | Status: DC
  Filled 2023-07-18: qty 90, 90d supply, fill #0
  Filled 2023-07-30 – 2023-10-16 (×2): qty 90, 90d supply, fill #1
  Filled 2024-01-16: qty 90, 90d supply, fill #2
  Filled 2024-01-18: qty 90, 90d supply, fill #0
  Filled 2024-01-18: qty 90, 90d supply, fill #2
  Filled 2024-01-18: qty 90, 90d supply, fill #0

## 2023-07-09 MED ORDER — FREESTYLE LIBRE 3 SENSOR MISC
3 refills | Status: DC
  Filled 2023-07-09 – 2023-07-30 (×2): qty 6, 84d supply, fill #0
  Filled 2023-10-29: qty 6, 84d supply, fill #1

## 2023-07-09 NOTE — Progress Notes (Incomplete)
 ADVANCED HEART FAILURE CLINIC NOTE  Referring Physician: Sharlene [name]*  Primary Care: John Dory, DO Primary Cardiologist: John Schultz, MD  CC: HFrEF  HPI: John [name] is a 58 y.o. male with T2DM, asthma, HTN, CKDII, PAD w/ right diabetic foot ulcer and left great toe amputation that was most recently admitted to [facility] on 03/08/22 for intractable nausea and emesis; during this time he was found have an elevated troponin leading to TTE w/ LVEF 25%-30%; LHC w/ nonobstructive CAD and  TD CI of 1.9L/min/m2. Prior to admission, other than significant nausea/vomiting, John [name] reports no other symptoms; no CP, SOB, LE edema.  Since discharge from the hospital he has been seen in [facility] clinic where GDMT was further uptitrated.    Works at the [employer] hx:  From a functional standpoint John [name] is doing remarkably well.  He continues to work in the [employer] with no significant limitations.  He is no longer having shortness of breath.  His only issue at this time is feeling very fatigued and lightheaded after taking BiDil.  After lowering BiDil dose to half tab 3 times daily he has had improvement in the symptoms.  Activity level/exercise tolerance:  NYHA II; doing very well; continuing to work without difficulty.  Orthopnea:  Sleeps on 2 pillows Paroxysmal noctural dyspnea:  no Chest pain/pressure:  no Orthostatic lightheadedness:  no Palpitations:  no Lower extremity edema:  no Presyncope/syncope:  no Cough:  no    Current Outpatient Medications  Medication Sig Dispense Refill   albuterol (VENTOLIN HFA) 108 (90 Base) MCG/ACT inhaler Inhale 2 puffs into the lungs every 4 (four) hours as needed for wheezing or shortness of breath. 6.7 g 1   aspirin 81 MG chewable tablet Chew 81 mg by mouth daily.     atorvastatin (LIPITOR) 80 MG tablet Take 1 tablet (80 mg total) by mouth daily. NEEDS FOLLOW UP APPOINTMENT FOR MORE REFILLS 90 tablet  0   carvedilol (COREG) 25 MG tablet Take 1 tablet (25 mg total) by mouth 2 (two) times daily with a meal. 180 tablet 2   Continuous Glucose Sensor (FREESTYLE LIBRE 3 SENSOR) MISC Place 1 sensor on the skin every 14 days. Use to check glucose continuously 6 each 3   dapagliflozin propanediol (FARXIGA) 10 MG TABS tablet Take 1 tablet (10 mg total) by mouth daily before breakfast. 90 tablet 3   insulin degludec (TRESIBA FLEXTOUCH) 100 UNIT/ML FlexTouch Pen Inject 18 Units into the skin at bedtime. 15 mL 3   Insulin Pen Needle (NOVOFINE PLUS PEN NEEDLE) 32G X 4 MM MISC Use daily to insulin 50 each 1   isosorbide-hydrALAZINE (BIDIL) 20-37.5 MG tablet Take 1/2 tablet by mouth 2 (two) times daily. 90 tablet 3   sacubitril-valsartan (ENTRESTO) 97-103 MG Take 1 tablet by mouth 2 (two) times daily. Please call [phone] to schedule a follow up 60 tablet 0   silver sulfADIAZINE (SILVADENE) 1 % cream Apply 1 Application topically daily. 50 g 0   spironolactone (ALDACTONE) 25 MG tablet Take 1 tablet (25 mg total) by mouth daily. 90 tablet 3   No current facility-administered medications for this encounter.    PHYSICAL EXAM: Vitals:   07/10/23 1338  BP: (!) 138/94  Pulse: 84  SpO2: 95%   GENERAL: NAD Lungs-CTA CARDIAC:  JVP: 7 cm          Normal rate with regular rhythm.  No murmur.  Pulses 2+.  No edema.  ABDOMEN: Soft, non-tender, non-distended.  EXTREMITIES: Warm and well perfused.  NEUROLOGIC: No obvious FND    DATA REVIEW  ECG: NSR w/ LVH  ECHO: 03/10/22: LVEF 25%-30%; RVF is normal. Mild-moderate LVH.   CATH:03/11/22 Non-obstructive coronary artery disease, consistent with nonischemic cardiomyopathy.  Most severe lesion is a 60-70% stenosis involving small, non-dominant RCA.  There is a 20-30% proximal LAD stenosis as well as mild luminal irregularities in the ramus intermedius. Upper normal to mildly elevated left heart and pulmonary artery pressures (PCWP 14 mmHg, LVEDP 18 mmHg,  mean PA 22 mmHg). Normal right heart filling pressures (RA/RVEDP 6 mmHg). Normal Fick cardiac output/index (CO 4.8 L/min, 2.6 L/min/m^2). Decreased thermodilution cardiac output/index (CO 3.5 L/min, 1.9 L/min/m^2).  CMR (06/28/22) 1. Mildly dilated LV with mild LV hypertrophy. EF 43%, global hypokinesis.  2.  Normal RV size with EF 52%.  3. Small area of <50% wall thickness subendocardial LGE in the mid inferior wall. This could be a small area of prior infarction.  ASSESSMENT & PLAN:  Heart Failure with reduced EF Etiology of ZO:[id number] cardiomyopathy diagnosed at the time significant enteritis, poor PO intake and intractable N/V; possibly viral, however, cannot r/o hypertensive cardiomyopathy w/ history of uncontrolled HTN and LVH on TTE and ECG. No significant family history; CMR ordered & pending.  NYHA class / AHA Stage:II, doing very well.  Volume status & Diuretics: Euvolemic. Lasix 20mg  PRN only now.  Vasodilators:Entresto 97/103 BID, continue BiDil half tab 3 times daily.  Reports symptomatic hypotension with increased doses.  Also reports higher blood pressures at clinic.  Will continue home monitoring.  Repeat echocardiogram ordered Beta-Blocker:coreg 25mg  BID MRA:spironolactone 25mg  daily, repeat BMP today Cardiometabolic:jardiance 10mg  daily Devices therapies & Valvulopathies:not currently indicated; CMR with improved in LVEF to 43%.  Will repeat echocardiogram at follow-up. Advanced therapies:not indicated.   2. Resistant HTN - coreg 25mg  BID, entresto 97/103 BID.  - 1/2 Bidil  TID - SBP 138 today; he has not taken his Bidil yet.   3. CKD - Improving.  Reviewed labs from 06/09/2023.  4. PAD - normal ABIs.  - distal extremity wounds 2/2 diabetes.   5. T2DM - A1C 10.5  John [name] Advanced Heart Failure Mechanical Circulatory Support

## 2023-07-09 NOTE — Progress Notes (Signed)
 Endocrinology Follow UpNote       07/09/2023, 3:18 PM   Subjective:    Patient ID: John [name], male    DOB: 1965-06-21.  John [name] is being seen in follow up after being seen in consultation for management of currently uncontrolled symptomatic diabetes requested by  Jobe Mulder, DO.  He is a [employer], works in [employer].   Past Medical History:  Diagnosis Date   Asthma    CAD in native artery    CHF (congestive heart failure) (HCC)    Chronic HFrEF (heart failure with reduced ejection fraction) (HCC)    CKD (chronic kidney disease) stage 2, GFR 60-89 ml/min    Diabetic foot ulcer (HCC)    History of chicken pox    History of DVT (deep vein thrombosis)    s/p trauma of R leg -- 2014. No other history of DVT   History of kidney stones    Hypertension    Lazy eye of left side    NICM (nonischemic cardiomyopathy) (HCC)    Pneumonia    Uncontrolled diabetes mellitus with hyperglycemia [facility])     Past Surgical History:  Procedure Laterality Date   AMPUTATION TOE Left 05/16/2021   Procedure: AMPUTATION LEFT GREAT  TOE;  Surgeon: Charity Conch, DPM;  Location: [facility];  Service: Podiatry;  Laterality: Left;   BONE BIOPSY Right 05/10/2022   Procedure: BONE BIOPSY;  Surgeon: Dot Gazella, DPM;  Location: [facility];  Service: Podiatry;  Laterality: Right;   EXOSTECTECTOMY TOE Right 05/10/2022   Procedure: EXOSTECTECTOMY TOE;  Surgeon: Dot Gazella, DPM;  Location: [facility];  Service: Podiatry;  Laterality: Right;   EYE SURGERY Right    cataract surgery  w/ IOL   RIGHT/LEFT HEART CATH AND CORONARY ANGIOGRAPHY N/A 03/11/2022   Procedure: RIGHT/LEFT HEART CATH AND CORONARY ANGIOGRAPHY;  Surgeon: Sammy Crisp, MD;  Location: [facility] INVASIVE CV [facility];  Service: Cardiovascular;  Laterality: N/A;    Social History   Socioeconomic History   Marital status: Married    Spouse name: Alecia Ames    Number of children: 1   Years of education: Not on file   Highest education level: Some college, no degree  Occupational History   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   Tobacco Use   Smoking status: Never   Smokeless tobacco: Never  Vaping Use   Vaping status: Never Used  Substance and Sexual Activity   Alcohol use: No   Drug use: No   Sexual activity: Yes    Partners: Female  Other Topics Concern   Not on file  Social History Narrative   Not on file   Social Drivers of Health   Financial Resource Strain: Low Risk  (06/21/2023)   Overall Financial Resource Strain (CARDIA)    Difficulty of Paying Living Expenses: Not hard at all  Food Insecurity: No Food Insecurity (06/21/2023)   Hunger Vital Sign    Worried About Running Out of Food in the Last Year: Never true    Ran Out of Food in the Last Year: Never true  Transportation Needs: No Transportation Needs (06/21/2023)   PRAPARE - Transportation  Lack of Transportation (Medical): No    Lack of Transportation (Non-Medical): No  Physical Activity: Insufficiently Active (06/21/2023)   Exercise Vital Sign    Days of Exercise per Week: 6 days    Minutes of Exercise per Session: 20 min  Stress: No Stress Concern Present (06/21/2023)   [employer] of Occupational Health - Occupational Stress Questionnaire    Feeling of Stress : Not at all  Social Connections: Moderately Integrated (06/21/2023)   Social Connection and Isolation Panel [NHANES]    Frequency of Communication with Friends and Family: More than three times a week    Frequency of Social Gatherings with Friends and Family: Twice a week    Attends Religious Services: Never    Database administrator or Organizations: Yes    Attends Banker Meetings: Never    Marital Status: Married    Family History  Problem Relation Age of Onset   Heart disease Mother 77   Arthritis Mother    Heart disease Father 63   Stroke Father    Healthy  Brother    Heart disease Maternal Grandmother    Diabetes Maternal Uncle     Outpatient Encounter Medications as of 07/09/2023  Medication Sig   albuterol (VENTOLIN HFA) 108 (90 Base) MCG/ACT inhaler Inhale 2 puffs into the lungs every 4 (four) hours as needed for wheezing or shortness of breath.   aspirin 81 MG chewable tablet Chew 81 mg by mouth daily.   atorvastatin (LIPITOR) 80 MG tablet Take 1 tablet (80 mg total) by mouth daily. NEEDS FOLLOW UP APPOINTMENT FOR MORE REFILLS   carvedilol (COREG) 25 MG tablet Take 1 tablet (25 mg total) by mouth 2 (two) times daily with a meal.   insulin degludec (TRESIBA FLEXTOUCH) 100 UNIT/ML FlexTouch Pen Inject 18 Units into the skin at bedtime.   Insulin Pen Needle (NOVOFINE PLUS PEN NEEDLE) 32G X 4 MM MISC Use daily to insulin   isosorbide-hydrALAZINE (BIDIL) 20-37.5 MG tablet Take 1/2 tablet by mouth 2 (two) times daily.   sacubitril-valsartan (ENTRESTO) 97-103 MG Take 1 tablet by mouth 2 (two) times daily. Please call [phone] to schedule a follow up   silver sulfADIAZINE (SILVADENE) 1 % cream Apply 1 Application topically daily.   spironolactone (ALDACTONE) 25 MG tablet Take 1 tablet (25 mg total) by mouth daily.   [DISCONTINUED] Continuous Glucose Sensor (FREESTYLE LIBRE 3 SENSOR) MISC Place 1 sensor on the skin every 14 days. Use to check glucose continuously   [DISCONTINUED] dapagliflozin propanediol (FARXIGA) 10 MG TABS tablet Take 1 tablet (10 mg total) by mouth daily before breakfast.   Continuous Glucose Sensor (FREESTYLE LIBRE 3 SENSOR) MISC Place 1 sensor on the skin every 14 days. Use to check glucose continuously   dapagliflozin propanediol (FARXIGA) 10 MG TABS tablet Take 1 tablet (10 mg total) by mouth daily before breakfast.   No facility-administered encounter medications on file as of 07/09/2023.    ALLERGIES: Allergies  Allergen Reactions   Bidil [Isosorb Dinitrate-Hydralazine] Other (See Comments)    Dropped BP - but has  tolerated lower dose   Zofran [Ondansetron Hcl] Nausea Only    Abdominal pain    VACCINATION STATUS: Immunization History  Administered Date(s) Administered   Influenza,inj,Quad PF,6+ Mos 01/13/2019, 01/04/2020   Influenza-Unspecified 11/24/2018, 11/28/2021, 12/27/2022   PFIZER(Purple Top)SARS-COV-2 Vaccination 06/03/2019, 06/24/2019, 01/10/2020, 01/04/2022   Pneumococcal Polysaccharide-23 10/18/2015   Tdap 06/25/2012    Diabetes He presents for his follow-up diabetic visit. He has type 2  diabetes mellitus. Onset time: Diagnosed at approx age of 68. His disease course has been improving. There are no hypoglycemic associated symptoms. There are no diabetic associated symptoms. There are no hypoglycemic complications. Diabetic complications include heart disease (CHF), nephropathy and PVD. Risk factors for coronary artery disease include diabetes mellitus, male sex, hypertension, dyslipidemia and family history. Current diabetic treatment includes oral agent (monotherapy) and insulin injections. He is compliant with treatment most of the time. His weight is fluctuating minimally. He is following a generally healthy diet. Meal planning includes avoidance of concentrated sweets. He has not had a previous visit with a dietitian. He participates in exercise intermittently. His home blood glucose trend is decreasing steadily. His overall blood glucose range is 110-130 mg/dl. (He presents today with his CGM showing at target glycemic profile.  His POCT A1c today is 7%, increasing slightly from last visit of 6.7%.  Analysis of his CGM shows TIR 96%, TAR 3%, TBR 1% with a GMI of 6.3%.  He does have mild mid-day hypoglycemia noted.) An ACE inhibitor/angiotensin II receptor blocker is being taken. He sees a podiatrist.Eye exam is current.     Review of systems  Constitutional: + stable body weight, current Body mass index is 24.42 kg/m., no fatigue, no subjective hyperthermia, no subjective  hypothermia Eyes: no blurry vision, no xerophthalmia ENT: no sore throat, no nodules palpated in throat, no dysphagia/odynophagia, no hoarseness Cardiovascular: no chest pain, no shortness of breath, no palpitations, no leg swelling Respiratory: no cough, no shortness of breath Gastrointestinal: no nausea/vomiting/diarrhea Musculoskeletal: no muscle/joint aches Skin: no rashes, no hyperemia, Neurological: no tremors, no numbness, no tingling, no dizziness Psychiatric: no depression, no anxiety  Objective:     BP 132/80 (BP Location: Right Arm, Patient Position: Sitting, Cuff Size: Large)   Pulse 89   Ht 5\' 10"  (1.778 m)   Wt 170 lb 3.2 oz (77.2 kg)   BMI 24.42 kg/m   Wt Readings from Last 3 Encounters:  07/09/23 170 lb 3.2 oz (77.2 kg)  06/23/23 168 lb 12.8 oz (76.6 kg)  05/27/23 176 lb (79.8 kg)     BP Readings from Last 3 Encounters:  07/09/23 132/80  06/23/23 132/76  05/27/23 (!) 154/78      Physical Exam- Limited  Constitutional:  Body mass index is 24.42 kg/m. , not in acute distress, normal state of mind Eyes:  EOMI, no exophthalmos Musculoskeletal: no gross deformities, strength intact in all four extremities, no gross restriction of joint movements Skin:  no rashes, no hyperemia Neurological: no tremor with outstretched hands   Diabetic Foot Exam - Simple   No data filed      CMP ( most recent) CMP     Component Value Date/Time   NA 142 01/07/2023 1609   K 4.0 01/07/2023 1609   CL 107 (H) 01/07/2023 1609   CO2 20 01/07/2023 1609   GLUCOSE 165 (H) 01/07/2023 1609   GLUCOSE 105 (H) 07/15/2022 1522   BUN 27 (H) 01/07/2023 1609   CREATININE 1.68 (H) 01/07/2023 1609   CREATININE 1.62 (H) 10/19/2021 1511   CALCIUM 9.6 06/09/2023 0914   PROT 7.9 04/12/2022 0756   ALBUMIN 4.3 04/12/2022 0756   AST 20 04/12/2022 0756   ALT 23 04/12/2022 0756   ALKPHOS 76 04/12/2022 0756   BILITOT 0.6 04/12/2022 0756   GFR 45.95 (L) 04/12/2022 0756   EGFR 57.0  06/09/2023 0914   EGFR 47 (L) 01/07/2023 1609   GFRNONAA 53 (L) 07/15/2022 1522  Diabetic Labs (most recent): Lab Results  Component Value Date   HGBA1C 7.0 (A) 07/09/2023   HGBA1C 6.7 (A) 04/07/2023   HGBA1C 7.2 (A) 12/06/2022   MICROALBUR 41.7 (H) 05/27/2023   MICROALBUR 150mg /L 04/07/2023   MICROALBUR 5.0 (H) 07/18/2020     Lipid Panel ( most recent) Lipid Panel     Component Value Date/Time   CHOL 141 04/12/2022 0756   TRIG 53.0 04/12/2022 0756   HDL 45.90 04/12/2022 0756   CHOLHDL 3 04/12/2022 0756   VLDL 10.6 04/12/2022 0756   LDLCALC 85 04/12/2022 0756   LDLCALC 119 (H) 10/19/2021 1511      Lab Results  Component Value Date   TSH 1.280 04/09/2022   TSH 1.16 08/27/2019   TSH 0.82 05/09/2017   FREET4 0.97 08/27/2019   FREET4 1.1 05/09/2017           Assessment & Plan:   1) Type 2 diabetes mellitus with foot ulcer, with long-term current use of insulin (HCC)  He presents today with his CGM showing at target glycemic profile.  His POCT A1c today is 7%, increasing slightly from last visit of 6.7%.  Analysis of his CGM shows TIR 96%, TAR 3%, TBR 1% with a GMI of 6.3%.  He does have mild mid-day hypoglycemia noted.  - John [name] has currently uncontrolled symptomatic type 2 DM since 58 years of age.   -Recent labs reviewed.  - I had a long discussion with him about the progressive nature of diabetes and the pathology behind its complications. -his diabetes is complicated by CHF, CKD stage 3a, diabetic foot ulcer and he remains at a high risk for more acute and chronic complications which include CAD, CVA, CKD, retinopathy, and neuropathy. These are all discussed in detail with him.  The following Lifestyle Medicine recommendations according to American College of Lifestyle Medicine [facility]) were discussed and offered to patient and he agrees to start the journey:  A. Whole Foods, Plant-based plate comprising of fruits and vegetables, plant-based proteins,  whole-grain carbohydrates was discussed in detail with the patient.   A list for source of those nutrients were also provided to the patient.  Patient will use only water or unsweetened tea for hydration. B.  The need to stay away from risky substances including alcohol, smoking; obtaining 7 to 9 hours of restorative sleep, at least 150 minutes of moderate intensity exercise weekly, the importance of healthy social connections,  and stress reduction techniques were discussed. C.  A full color page of  Calorie density of various food groups per pound showing examples of each food groups was provided to the patient.  - Nutritional counseling repeated at each appointment due to patients tendency to fall back in to old habits.  - The patient admits there is a room for improvement in their diet and drink choices. -  Suggestion is made for the patient to avoid simple carbohydrates from their diet including Cakes, Sweet Desserts / Pastries, Ice Cream, Soda (diet and regular), Sweet Tea, Candies, Chips, Cookies, Sweet Pastries, Store Bought Juices, Alcohol in Excess of 1-2 drinks a day, Artificial Sweeteners, Coffee Creamer, and "Sugar-free" Products. This will help patient to have stable blood glucose profile and potentially avoid unintended weight gain.   - I encouraged the patient to switch to unprocessed or minimally processed complex starch and increased protein intake (animal or plant source), fruits, and vegetables.   - Patient is advised to stick to a routine mealtimes to eat 3 meals a  day and avoid unnecessary snacks (to snack only to correct hypoglycemia).  - I have approached him with the following individualized plan to manage his diabetes and patient agrees:   - he is advised to lower his Tresiba to 5 units SQ nightly (may be able to stop this between visits or at next visit) and continue Farxiga 10 mg po daily.  -he is encouraged to continue monitoring glucose 2 times daily (using his CGM),  before breakfast and before bed, and to call the clinic if he has readings less than 70 or above 300 for 3 tests in a row.  - he is warned not to take insulin without proper monitoring per orders. - Adjustment parameters are given to him for hypo and hyperglycemia in writing. - he is encouraged to call clinic for blood glucose levels less than 70 or above 300 mg /dl.  - he is not a candidate for Metformin due to concurrent renal insufficiency.  - he is not an ideal candidate for incretin therapy due to body habitus.  - Specific targets for  A1c; LDL, HDL, and Triglycerides were discussed with the patient.  2) Blood Pressure /Hypertension:  his blood pressure is controlled to target.  He is advised to continue meds as prescribed by PCP/cardiology.  3) Lipids/Hyperlipidemia:    Review of his recent lipid panel from 04/12/22 showed controlled LDL at 85 .  he is advised to continue Lipitor 80 mg daily at bedtime.  Side effects and precautions discussed with him.  He did not have his lipid panel checked between visits as requested.  Will defer to PCP.  4)  Weight/Diet:  his Body mass index is 24.42 kg/m.  -  he is NOT a candidate for weight loss.  Exercise, and detailed carbohydrates information provided  -  detailed on discharge instructions.  5) Chronic Care/Health Maintenance: -he is on ACEI/ARB and Statin medications and is encouraged to initiate and continue to follow up with Ophthalmology, Dentist, Podiatrist at least yearly or according to recommendations, and advised to stay away from smoking. I have recommended yearly flu vaccine and pneumonia vaccine at least every 5 years; moderate intensity exercise for up to 150 minutes weekly; and sleep for at least 7 hours a day.  - he is advised to maintain close follow up with Sharlene Dory, DO for primary care needs, as well as his other providers for optimal and coordinated care.    I spent  27  minutes in the care of the patient  today including review of labs from CMP, Lipids, Thyroid Function, Hematology (current and previous including abstractions from other facilities); face-to-face time discussing  his blood glucose readings/logs, discussing hypoglycemia and hyperglycemia episodes and symptoms, medications doses, his options of short and long term treatment based on the latest standards of care / guidelines;  discussion about incorporating lifestyle medicine;  and documenting the encounter. Risk reduction counseling performed per USPSTF guidelines to reduce obesity and cardiovascular risk factors.     Please refer to Patient Instructions for Blood Glucose Monitoring and Insulin/Medications Dosing Guide"  in media tab for additional information. Please  also refer to " Patient Self Inventory" in the Media  tab for reviewed elements of pertinent patient history.  John [name] participated in the discussions, expressed understanding, and voiced agreement with the above plans.  All questions were answered to his satisfaction. he is encouraged to contact clinic should he have any questions or concerns prior to his return visit.  Follow up plan: - Return in about 4 months (around 11/08/2023) for Diabetes F/U with A1c in office, No previsit labs, Bring meter and logs.  John [name], [facility] [address] [place], [place] [address] Phone: [phone] Fax: [phone]  07/09/2023, 3:18 PM

## 2023-07-10 ENCOUNTER — Encounter (HOSPITAL_COMMUNITY): Payer: Self-pay | Admitting: Cardiology

## 2023-07-10 ENCOUNTER — Ambulatory Visit (HOSPITAL_COMMUNITY)
Admission: RE | Admit: 2023-07-10 | Discharge: 2023-07-10 | Disposition: A | Discharge status: Home / Self Care | Source: Ambulatory Visit | Attending: Cardiology | Admitting: Cardiology

## 2023-07-10 ENCOUNTER — Ambulatory Visit: Payer: Commercial Managed Care - PPO | Admitting: Nurse Practitioner

## 2023-07-10 VITALS — BP 138/94 | HR 84 | Ht 70.0 in | Wt 169.2 lb

## 2023-07-10 DIAGNOSIS — I2510 Atherosclerotic heart disease of native coronary artery without angina pectoris: Secondary | ICD-10-CM | POA: Diagnosis not present

## 2023-07-10 DIAGNOSIS — N182 Chronic kidney disease, stage 2 (mild): Secondary | ICD-10-CM | POA: Diagnosis not present

## 2023-07-10 DIAGNOSIS — Z89412 Acquired absence of left great toe: Secondary | ICD-10-CM | POA: Insufficient documentation

## 2023-07-10 DIAGNOSIS — E1151 Type 2 diabetes mellitus with diabetic peripheral angiopathy without gangrene: Secondary | ICD-10-CM | POA: Insufficient documentation

## 2023-07-10 DIAGNOSIS — I428 Other cardiomyopathies: Secondary | ICD-10-CM | POA: Insufficient documentation

## 2023-07-10 DIAGNOSIS — Z794 Long term (current) use of insulin: Secondary | ICD-10-CM | POA: Insufficient documentation

## 2023-07-10 DIAGNOSIS — Z7984 Long term (current) use of oral hypoglycemic drugs: Secondary | ICD-10-CM | POA: Insufficient documentation

## 2023-07-10 DIAGNOSIS — N1831 Chronic kidney disease, stage 3a: Secondary | ICD-10-CM

## 2023-07-10 DIAGNOSIS — I1A0 Resistant hypertension: Secondary | ICD-10-CM | POA: Insufficient documentation

## 2023-07-10 DIAGNOSIS — I130 Hypertensive heart and chronic kidney disease with heart failure and stage 1 through stage 4 chronic kidney disease, or unspecified chronic kidney disease: Secondary | ICD-10-CM | POA: Diagnosis not present

## 2023-07-10 DIAGNOSIS — Z79899 Other long term (current) drug therapy: Secondary | ICD-10-CM | POA: Diagnosis not present

## 2023-07-10 DIAGNOSIS — I5022 Chronic systolic (congestive) heart failure: Secondary | ICD-10-CM | POA: Diagnosis not present

## 2023-07-10 DIAGNOSIS — J45909 Unspecified asthma, uncomplicated: Secondary | ICD-10-CM | POA: Diagnosis not present

## 2023-07-10 DIAGNOSIS — E1122 Type 2 diabetes mellitus with diabetic chronic kidney disease: Secondary | ICD-10-CM | POA: Diagnosis not present

## 2023-07-10 NOTE — Patient Instructions (Signed)
 Echocardiogram as scheduled   Follow-Up in: 6 months with Dr. Bruce Caper   At the [facility], you and your health needs are our priority. We have a designated team specialized in the treatment of Heart Failure. This Care Team includes your primary Heart Failure Specialized Cardiologist (physician), Advanced Practice Providers (APPs- Physician Assistants and Nurse Practitioners), and Pharmacist who all work together to provide you with the care you need, when you need it.   You may see any of the following providers on your designated Care Team at your next follow up:  Dr. Jules Oar Dr. Peder Bourdon Dr. Alwin Baars Dr. Judyth Nunnery Nieves Bars, NP Ruddy Corral, [place] [facility] [place], [place] Dennise Fitz, NP [place], NP Luster Salters, PharmD   Please be sure to bring in all your medications bottles to every appointment.   Need to Contact Us :  If you have any questions or concerns before your next appointment please send us  a message through [place] or call our office at [phone].    TO LEAVE A MESSAGE FOR THE NURSE SELECT OPTION 2, PLEASE LEAVE A MESSAGE INCLUDING: YOUR NAME DATE OF BIRTH CALL BACK NUMBER REASON FOR CALL**this is important as we prioritize the call backs  YOU WILL RECEIVE A CALL BACK THE SAME DAY AS LONG AS YOU CALL BEFORE 4:00 PM

## 2023-07-11 ENCOUNTER — Ambulatory Visit: Payer: Commercial Managed Care - PPO | Attending: Cardiology | Admitting: Cardiology

## 2023-07-14 ENCOUNTER — Ambulatory Visit (INDEPENDENT_AMBULATORY_CARE_PROVIDER_SITE_OTHER): Payer: Commercial Managed Care - PPO | Admitting: Podiatry

## 2023-07-14 DIAGNOSIS — M79675 Pain in left toe(s): Secondary | ICD-10-CM

## 2023-07-14 DIAGNOSIS — L97512 Non-pressure chronic ulcer of other part of right foot with fat layer exposed: Secondary | ICD-10-CM | POA: Diagnosis not present

## 2023-07-14 DIAGNOSIS — M79674 Pain in right toe(s): Secondary | ICD-10-CM | POA: Diagnosis not present

## 2023-07-14 DIAGNOSIS — B351 Tinea unguium: Secondary | ICD-10-CM

## 2023-07-14 NOTE — Progress Notes (Signed)
 Chief Complaint  Patient presents with   Diabetes    Patient is here for RFC    Subjective:  Patient presents today status post right great toe IPJ arthrodesis.  DOS: 11/21/2022 with subsequent removal of orthopedic screw to the right great toe performed in the office on 04/29/2023.  Patient doing well.  Presenting for routine footcare  Past Medical History:  Diagnosis Date   Asthma    CAD in native artery    CHF (congestive heart failure) (HCC)    Chronic HFrEF (heart failure with reduced ejection fraction) (HCC)    CKD (chronic kidney disease) stage 2, GFR 60-89 ml/min    Diabetic foot ulcer (HCC)    History of chicken pox    History of DVT (deep vein thrombosis)    s/p trauma of R leg -- 2014. No other history of DVT   History of kidney stones    Hypertension    Lazy eye of left side    NICM (nonischemic cardiomyopathy) (HCC)    Pneumonia    Uncontrolled diabetes mellitus with hyperglycemia [facility])     Past Surgical History:  Procedure Laterality Date   AMPUTATION TOE Left 05/16/2021   Procedure: AMPUTATION LEFT GREAT  TOE;  Surgeon: Charity Conch, DPM;  [facility]: [facility] OR;  Service: Podiatry;  Laterality: Left;   BONE BIOPSY Right 05/10/2022   Procedure: BONE BIOPSY;  Surgeon: Dot Gazella, DPM;  Location: [facility];  Service: Podiatry;  Laterality: Right;   EXOSTECTECTOMY TOE Right 05/10/2022   Procedure: EXOSTECTECTOMY TOE;  Surgeon: Dot Gazella, DPM;  Location: [facility];  Service: Podiatry;  Laterality: Right;   EYE SURGERY Right    cataract surgery  w/ IOL   RIGHT/LEFT HEART CATH AND CORONARY ANGIOGRAPHY N/A 03/11/2022   Procedure: RIGHT/LEFT HEART CATH AND CORONARY ANGIOGRAPHY;  Surgeon: Sammy Crisp, MD;  [facility]: [facility] INV[facility];  Service: Cardiovascular;  Laterality: N/A;    Allergies  Allergen Reactions   Bidil  [Isosorb Dinitrate-Hydralazine ] Other (See Comments)    Dropped BP - but has tolerated lower dose   Zofran  [Ondansetron  Hcl] Nausea Only     Abdominal pain    RT great toe 01/22/2023  RT great toe 01/22/2023  Objective/Physical Exam Vascular status intact.  Callused area to the plantar aspect of the right great toe.  Hyperkeratotic dystrophic nails noted 1-5 bilateral.  History of left great toe amputation  Radiographic Exam RT foot 01/22/2023:  Unchanged.  Rectus alignment of the great toe.  The orthopedic screw does appear to be plantar to the distal phalanx.  There must be some stability at the IPJ and it appears that the screw does cross the base of the proximal phalanx stabilizing the joint.  Assessment: 1. s/p IPJ arthrodesis right great toe. DOS: 11/27/2022.  Subsequent ROH in office.  04/29/2023 2.  Diabetes mellitus with peripheral polyneuropathy 3.  History of left great toe amputation 4.  Preulcerative callus lesion right great toe 5.  Pain due to onychomycosis of toenails both   Plan of Care:  -Patient was evaluated.  -Excisional debridement of the callus area was performed today using a 312 scalpel to the right great toe.   - Mechanical debridement of nails 1-5 bilateral's performed using a nail nipper without incident or bleeding. -Continue to maintain good foot hygiene -Return to clinic 3 months routine footcare  Dot Gazella, DPM [facility]  Dr. Dot Gazella, DPM    [address]  [place], [place] [address]                Office [phone]  Fax [phone]

## 2023-07-18 ENCOUNTER — Other Ambulatory Visit (HOSPITAL_COMMUNITY): Payer: Self-pay

## 2023-07-29 ENCOUNTER — Other Ambulatory Visit: Payer: Self-pay | Admitting: Cardiology

## 2023-07-30 ENCOUNTER — Other Ambulatory Visit: Payer: Self-pay | Admitting: Cardiology

## 2023-07-30 ENCOUNTER — Other Ambulatory Visit (HOSPITAL_COMMUNITY): Payer: Self-pay

## 2023-07-30 ENCOUNTER — Other Ambulatory Visit: Payer: Self-pay

## 2023-08-01 ENCOUNTER — Other Ambulatory Visit (HOSPITAL_COMMUNITY): Payer: Self-pay

## 2023-08-01 MED ORDER — ENTRESTO 97-103 MG PO TABS
1.0000 | ORAL_TABLET | Freq: Two times a day (BID) | ORAL | 0 refills | Status: DC
  Filled 2023-08-01: qty 60, 30d supply, fill #0

## 2023-08-06 ENCOUNTER — Other Ambulatory Visit (HOSPITAL_COMMUNITY): Payer: Self-pay | Admitting: Cardiology

## 2023-08-07 ENCOUNTER — Other Ambulatory Visit: Payer: Self-pay

## 2023-08-07 ENCOUNTER — Other Ambulatory Visit (HOSPITAL_COMMUNITY): Payer: Self-pay

## 2023-08-07 MED ORDER — ATORVASTATIN CALCIUM 80 MG PO TABS
80.0000 mg | ORAL_TABLET | Freq: Every day | ORAL | 3 refills | Status: AC
  Filled 2023-08-07: qty 90, 90d supply, fill #0
  Filled 2023-11-10: qty 90, 90d supply, fill #1
  Filled 2024-02-16: qty 90, 90d supply, fill #2

## 2023-08-13 ENCOUNTER — Ambulatory Visit (HOSPITAL_COMMUNITY)
Admission: RE | Admit: 2023-08-13 | Discharge: 2023-08-13 | Disposition: A | Discharge status: Home / Self Care | Source: Ambulatory Visit | Attending: Cardiology | Admitting: Cardiology

## 2023-08-13 DIAGNOSIS — I429 Cardiomyopathy, unspecified: Secondary | ICD-10-CM | POA: Insufficient documentation

## 2023-08-13 DIAGNOSIS — I358 Other nonrheumatic aortic valve disorders: Secondary | ICD-10-CM | POA: Insufficient documentation

## 2023-08-13 DIAGNOSIS — E119 Type 2 diabetes mellitus without complications: Secondary | ICD-10-CM | POA: Diagnosis not present

## 2023-08-13 DIAGNOSIS — N1831 Chronic kidney disease, stage 3a: Secondary | ICD-10-CM | POA: Diagnosis not present

## 2023-08-13 DIAGNOSIS — I5022 Chronic systolic (congestive) heart failure: Secondary | ICD-10-CM

## 2023-08-13 DIAGNOSIS — I110 Hypertensive heart disease with heart failure: Secondary | ICD-10-CM | POA: Insufficient documentation

## 2023-08-13 DIAGNOSIS — I2510 Atherosclerotic heart disease of native coronary artery without angina pectoris: Secondary | ICD-10-CM | POA: Insufficient documentation

## 2023-08-13 DIAGNOSIS — E1122 Type 2 diabetes mellitus with diabetic chronic kidney disease: Secondary | ICD-10-CM | POA: Diagnosis not present

## 2023-08-13 DIAGNOSIS — E785 Hyperlipidemia, unspecified: Secondary | ICD-10-CM | POA: Diagnosis not present

## 2023-08-13 DIAGNOSIS — D631 Anemia in chronic kidney disease: Secondary | ICD-10-CM | POA: Diagnosis not present

## 2023-08-13 DIAGNOSIS — I129 Hypertensive chronic kidney disease with stage 1 through stage 4 chronic kidney disease, or unspecified chronic kidney disease: Secondary | ICD-10-CM | POA: Diagnosis not present

## 2023-08-13 LAB — ECHOCARDIOGRAM COMPLETE
AR max vel: 2.93 cm2
AV Peak grad: 5.5 mmHg
Ao pk vel: 1.17 m/s
Area-P 1/2: 2.53 cm2
MV VTI: 3.12 cm2
S' Lateral: 3.2 cm

## 2023-08-14 LAB — LAB REPORT - SCANNED
Albumin, Urine POC: 127.2
Creatinine, POC: 68.1 mg/dL
EGFR: 43
Microalb Creat Ratio: 187

## 2023-09-02 DIAGNOSIS — E119 Type 2 diabetes mellitus without complications: Secondary | ICD-10-CM | POA: Diagnosis not present

## 2023-09-02 DIAGNOSIS — H524 Presbyopia: Secondary | ICD-10-CM | POA: Diagnosis not present

## 2023-09-02 LAB — HM DIABETES EYE EXAM

## 2023-09-03 ENCOUNTER — Other Ambulatory Visit: Payer: Self-pay | Admitting: Cardiology

## 2023-09-05 ENCOUNTER — Other Ambulatory Visit (HOSPITAL_COMMUNITY): Payer: Self-pay

## 2023-09-05 MED ORDER — ENTRESTO 97-103 MG PO TABS
1.0000 | ORAL_TABLET | Freq: Two times a day (BID) | ORAL | 3 refills | Status: AC
  Filled 2023-09-05: qty 180, 90d supply, fill #0
  Filled 2023-12-22: qty 180, 90d supply, fill #1
  Filled 2024-03-18: qty 180, 90d supply, fill #2
  Filled 2024-04-18 (×2): qty 180, 90d supply, fill #0

## 2023-09-15 ENCOUNTER — Other Ambulatory Visit (HOSPITAL_COMMUNITY): Payer: Self-pay | Admitting: Cardiology

## 2023-09-16 ENCOUNTER — Other Ambulatory Visit: Payer: Self-pay

## 2023-09-17 ENCOUNTER — Other Ambulatory Visit (HOSPITAL_COMMUNITY): Payer: Self-pay

## 2023-09-17 MED ORDER — ISOSORB DINITRATE-HYDRALAZINE 20-37.5 MG PO TABS
0.5000 | ORAL_TABLET | Freq: Two times a day (BID) | ORAL | 3 refills | Status: AC
  Filled 2023-09-17: qty 90, 90d supply, fill #0
  Filled 2024-01-16: qty 90, 90d supply, fill #1
  Filled 2024-04-18 (×2): qty 90, 90d supply, fill #0

## 2023-10-06 DIAGNOSIS — N1831 Chronic kidney disease, stage 3a: Secondary | ICD-10-CM | POA: Diagnosis not present

## 2023-10-13 ENCOUNTER — Encounter: Payer: Self-pay | Admitting: Podiatry

## 2023-10-13 ENCOUNTER — Ambulatory Visit (INDEPENDENT_AMBULATORY_CARE_PROVIDER_SITE_OTHER): Admitting: Podiatry

## 2023-10-13 VITALS — Ht 70.0 in | Wt 169.2 lb

## 2023-10-13 DIAGNOSIS — M79674 Pain in right toe(s): Secondary | ICD-10-CM

## 2023-10-13 DIAGNOSIS — M79675 Pain in left toe(s): Secondary | ICD-10-CM | POA: Diagnosis not present

## 2023-10-13 DIAGNOSIS — L97512 Non-pressure chronic ulcer of other part of right foot with fat layer exposed: Secondary | ICD-10-CM | POA: Diagnosis not present

## 2023-10-13 DIAGNOSIS — B351 Tinea unguium: Secondary | ICD-10-CM | POA: Diagnosis not present

## 2023-10-13 NOTE — Progress Notes (Signed)
 Chief Complaint  Patient presents with   Nail Problem    DFC.    Subjective:  Patient presents today status post right great toe IPJ arthrodesis.  DOS: 11/21/2022 with subsequent removal of orthopedic screw to the right great toe performed in the office on 04/29/2023.  Patient doing well.  Presenting for routine footcare  Past Medical History:  Diagnosis Date   Asthma    CAD in native artery    CHF (congestive heart failure) (HCC)    Chronic HFrEF (heart failure with reduced ejection fraction) (HCC)    CKD (chronic kidney disease) stage 2, GFR 60-89 ml/min    Diabetic foot ulcer (HCC)    History of chicken pox    History of DVT (deep vein thrombosis)    s/p trauma of R leg -- 2014. No other history of DVT   History of kidney stones    Hypertension    Lazy eye of left side    NICM (nonischemic cardiomyopathy) (HCC)    Pneumonia    Uncontrolled diabetes mellitus with hyperglycemia [facility])     Past Surgical History:  Procedure Laterality Date   AMPUTATION TOE Left 05/16/2021   Procedure: AMPUTATION LEFT GREAT  TOE;  Surgeon: Gershon Donnice SAUNDERS, DPM;  Location: [facility] OR;  Service: Podiatry;  Laterality: Left;   BONE BIOPSY Right 05/10/2022   Procedure: BONE BIOPSY;  Surgeon: Janit Thresa HERO, DPM;  [facility];  Service: Podiatry;  Laterality: Right;   EXOSTECTECTOMY TOE Right 05/10/2022   Procedure: EXOSTECTECTOMY TOE;  Surgeon: Janit Thresa HERO, DPM;  [facility];  Service: Podiatry;  Laterality: Right;   EYE SURGERY Right    cataract surgery  w/ IOL   RIGHT/LEFT HEART CATH AND CORONARY ANGIOGRAPHY N/A 03/11/2022   Procedure: RIGHT/LEFT HEART CATH AND CORONARY ANGIOGRAPHY;  Surgeon: Mady Bruckner, MD;  [facility];  Service: Cardiovascular;  Laterality: N/A;    Allergies  Allergen Reactions   Bidil  [Isosorb Dinitrate-Hydralazine ] Other (See Comments)    Dropped BP - but has tolerated lower dose   Zofran  [Ondansetron  Hcl] Nausea Only    Abdominal pain     RT great toe 01/22/2023  RT great toe 01/22/2023  Objective/Physical Exam Vascular status intact.  Callused area to the plantar aspect of the right great toe.  Hyperkeratotic dystrophic nails noted 1-5 bilateral.  History of left great toe amputation  Radiographic Exam RT foot 01/22/2023:  Unchanged.  Rectus alignment of the great toe.  The orthopedic screw does appear to be plantar to the distal phalanx.  There must be some stability at the IPJ and it appears that the screw does cross the base of the proximal phalanx stabilizing the joint.  Assessment: 1. s/p IPJ arthrodesis right great toe. DOS: 11/27/2022.  Subsequent ROH in office.  04/29/2023 2.  Diabetes mellitus with peripheral polyneuropathy 3.  History of left great toe amputation 4.  Preulcerative callus lesion right great toe 5.  Pain due to onychomycosis of toenails both   Plan of Care:  -Patient was evaluated.  -Excisional debridement of the callus area was performed today using a 312 scalpel to the right great toe.   - Mechanical debridement of nails 1-5 bilateral's performed using a nail nipper without incident or bleeding. -Continue to maintain good foot hygiene -Return to clinic 3 months routine footcare  Thresa EMERSON Janit, DPM [facility]  Dr. Thresa EMERSON Janit, DPM    [address]  [place], [place] [address]                Office [phone]  Fax [phone]

## 2023-10-15 DIAGNOSIS — N1831 Chronic kidney disease, stage 3a: Secondary | ICD-10-CM | POA: Diagnosis not present

## 2023-10-15 DIAGNOSIS — E785 Hyperlipidemia, unspecified: Secondary | ICD-10-CM | POA: Diagnosis not present

## 2023-10-15 DIAGNOSIS — I129 Hypertensive chronic kidney disease with stage 1 through stage 4 chronic kidney disease, or unspecified chronic kidney disease: Secondary | ICD-10-CM | POA: Diagnosis not present

## 2023-10-15 DIAGNOSIS — D631 Anemia in chronic kidney disease: Secondary | ICD-10-CM | POA: Diagnosis not present

## 2023-10-15 DIAGNOSIS — E1122 Type 2 diabetes mellitus with diabetic chronic kidney disease: Secondary | ICD-10-CM | POA: Diagnosis not present

## 2023-10-30 ENCOUNTER — Other Ambulatory Visit (HOSPITAL_COMMUNITY): Payer: Self-pay

## 2023-11-10 ENCOUNTER — Other Ambulatory Visit: Payer: Self-pay

## 2023-11-10 ENCOUNTER — Ambulatory Visit: Admitting: Nurse Practitioner

## 2023-11-10 ENCOUNTER — Encounter: Payer: Self-pay | Admitting: Nurse Practitioner

## 2023-11-10 ENCOUNTER — Other Ambulatory Visit (HOSPITAL_COMMUNITY): Payer: Self-pay

## 2023-11-10 ENCOUNTER — Other Ambulatory Visit: Payer: Self-pay | Admitting: Family Medicine

## 2023-11-10 VITALS — BP 118/80 | HR 73 | Ht 70.0 in | Wt 171.4 lb

## 2023-11-10 DIAGNOSIS — N1831 Chronic kidney disease, stage 3a: Secondary | ICD-10-CM | POA: Diagnosis not present

## 2023-11-10 DIAGNOSIS — Z7984 Long term (current) use of oral hypoglycemic drugs: Secondary | ICD-10-CM

## 2023-11-10 DIAGNOSIS — Z794 Long term (current) use of insulin: Secondary | ICD-10-CM

## 2023-11-10 DIAGNOSIS — E1122 Type 2 diabetes mellitus with diabetic chronic kidney disease: Secondary | ICD-10-CM

## 2023-11-10 DIAGNOSIS — I10 Essential (primary) hypertension: Secondary | ICD-10-CM

## 2023-11-10 LAB — POCT GLYCOSYLATED HEMOGLOBIN (HGB A1C): Hemoglobin A1C: 7.7 % — AB (ref 4.0–5.6)

## 2023-11-10 MED ORDER — FREESTYLE LIBRE 3 PLUS SENSOR MISC
3 refills | Status: AC
  Filled 2023-11-10 – 2024-04-18 (×4): qty 6, 90d supply, fill #0

## 2023-11-10 MED ORDER — TRESIBA FLEXTOUCH 100 UNIT/ML ~~LOC~~ SOPN
5.0000 [IU] | PEN_INJECTOR | Freq: Every day | SUBCUTANEOUS | 3 refills | Status: AC
  Filled 2023-11-10: qty 3, 56d supply, fill #0
  Filled 2024-01-16: qty 3, 56d supply, fill #1
  Filled 2024-04-18: qty 3, 56d supply, fill #0
  Filled 2024-04-18: qty 6, 90d supply, fill #0

## 2023-11-10 MED ORDER — NOVOFINE PLUS PEN NEEDLE 32G X 4 MM MISC
1.0000 | Freq: Every day | 1 refills | Status: AC
  Filled 2023-11-10: qty 100, 90d supply, fill #0

## 2023-11-10 NOTE — Progress Notes (Signed)
 Endocrinology Follow UpNote       11/10/2023, 3:29 PM   Subjective:    Patient ID: Sami Froh, male    DOB: 1965-11-27.  Nuno Brubacher is being seen in follow up after being seen in consultation for management of currently uncontrolled symptomatic diabetes requested by  Frann Mabel Mt, DO.  He is a [employer], works in [employer].   Past Medical History:  Diagnosis Date   Asthma    CAD in native artery    CHF (congestive heart failure) (HCC)    Chronic HFrEF (heart failure with reduced ejection fraction) (HCC)    CKD (chronic kidney disease) stage 2, GFR 60-89 ml/min    Diabetic foot ulcer (HCC)    History of chicken pox    History of DVT (deep vein thrombosis)    s/p trauma of R leg -- 2014. No other history of DVT   History of kidney stones    Hypertension    Lazy eye of left side    NICM (nonischemic cardiomyopathy) (HCC)    Pneumonia    Uncontrolled diabetes mellitus with hyperglycemia [facility])     Past Surgical History:  Procedure Laterality Date   AMPUTATION TOE Left 05/16/2021   Procedure: AMPUTATION LEFT GREAT  TOE;  Surgeon: Gershon Donnice SAUNDERS, DPM;  [facility];  Service: Podiatry;  Laterality: Left;   BONE BIOPSY Right 05/10/2022   Procedure: BONE BIOPSY;  Surgeon: Janit Thresa HERO, DPM;  [facility];  Service: Podiatry;  Laterality: Right;   EXOSTECTECTOMY TOE Right 05/10/2022   Procedure: EXOSTECTECTOMY TOE;  Surgeon: Janit Thresa HERO, DPM;  [facility];  Service: Podiatry;  Laterality: Right;   EYE SURGERY Right    cataract surgery  w/ IOL   RIGHT/LEFT HEART CATH AND CORONARY ANGIOGRAPHY N/A 03/11/2022   Procedure: RIGHT/LEFT HEART CATH AND CORONARY ANGIOGRAPHY;  Surgeon: Mady Bruckner, MD;  [facility];  Service: Cardiovascular;  Laterality: N/A;    Social History   Socioeconomic History   Marital status: Married    Spouse name: Cathi    Number of children: 1   Years of education: Not on file   Highest education level: Some college, no degree  Occupational History   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   Tobacco Use   Smoking status: Never   Smokeless tobacco: Never  Vaping Use   Vaping status: Never Used  Substance and Sexual Activity   Alcohol use: No   Drug use: No   Sexual activity: Yes    Partners: Female  Other Topics Concern   Not on file  Social History Narrative   Not on file   Social Drivers of Health   Financial Resource Strain: Low Risk  (06/21/2023)   Overall Financial Resource Strain (CARDIA)    Difficulty of Paying Living Expenses: Not hard at all  Food Insecurity: No Food Insecurity (06/21/2023)   Hunger Vital Sign    Worried About Running Out of Food in the Last Year: Never true    Ran Out of Food in the Last Year: Never true  Transportation Needs: No Transportation Needs (06/21/2023)   PRAPARE - Transportation  Lack of Transportation (Medical): No    Lack of Transportation (Non-Medical): No  Physical Activity: Insufficiently Active (06/21/2023)   Exercise Vital Sign    Days of Exercise per Week: 6 days    Minutes of Exercise per Session: 20 min  Stress: No Stress Concern Present (06/21/2023)   [employer] of Occupational Health - Occupational Stress Questionnaire    Feeling of Stress : Not at all  Social Connections: Moderately Integrated (06/21/2023)   Social Connection and Isolation Panel    Frequency of Communication with Friends and Family: More than three times a week    Frequency of Social Gatherings with Friends and Family: Twice a week    Attends Religious Services: Never    Database administrator or Organizations: Yes    Attends Banker Meetings: Never    Marital Status: Married    Family History  Problem Relation Age of Onset   Heart disease Mother 79   Arthritis Mother    Heart disease Father 1   Stroke Father    Healthy Brother     Heart disease Maternal Grandmother    Diabetes Maternal Uncle     Outpatient Encounter Medications as of 11/10/2023  Medication Sig   albuterol  (VENTOLIN  HFA) 108 (90 Base) MCG/ACT inhaler Inhale 2 puffs into the lungs every 4 (four) hours as needed for wheezing or shortness of breath.   aspirin  81 MG chewable tablet Chew 81 mg by mouth daily.   atorvastatin  (LIPITOR) 80 MG tablet Take 1 tablet (80 mg total) by mouth daily.   carvedilol  (COREG ) 25 MG tablet Take 1 tablet (25 mg total) by mouth 2 (two) times daily with a meal.   Continuous Glucose Sensor (FREESTYLE LIBRE 3 PLUS SENSOR) MISC Change sensor every 15 days.   dapagliflozin  propanediol (FARXIGA ) 10 MG TABS tablet Take 1 tablet (10 mg total) by mouth daily before breakfast.   isosorbide -hydrALAZINE  (BIDIL ) 20-37.5 MG tablet Take 1/2 tablet by mouth 2 (two) times daily.   sacubitril -valsartan  (ENTRESTO ) 97-103 MG Take 1 tablet by mouth 2 (two) times daily.   silver  sulfADIAZINE  (SILVADENE ) 1 % cream Apply 1 Application topically daily.   spironolactone  (ALDACTONE ) 25 MG tablet Take 1 tablet (25 mg total) by mouth daily.   [DISCONTINUED] Continuous Glucose Sensor (FREESTYLE LIBRE 3 SENSOR) MISC Place 1 sensor on the skin every 14 days. Use to check glucose continuously   [DISCONTINUED] insulin  degludec (TRESIBA  FLEXTOUCH) 100 UNIT/ML FlexTouch Pen Inject 18 Units into the skin at bedtime. (Patient taking differently: Inject 5 Units into the skin at bedtime.)   [DISCONTINUED] Insulin  Pen Needle (NOVOFINE PLUS PEN NEEDLE) 32G X 4 MM MISC Use daily to insulin    insulin  degludec (TRESIBA  FLEXTOUCH) 100 UNIT/ML FlexTouch Pen Inject 5 Units into the skin at bedtime.   Insulin  Pen Needle (NOVOFINE PLUS PEN NEEDLE) 32G X 4 MM MISC Use daily to insulin    No facility-administered encounter medications on file as of 11/10/2023.    ALLERGIES: Allergies  Allergen Reactions   Bidil  [Isosorb Dinitrate-Hydralazine ] Other (See Comments)    Dropped  BP - but has tolerated lower dose   Zofran  [Ondansetron  Hcl] Nausea Only    Abdominal pain    VACCINATION STATUS: Immunization History  Administered Date(s) Administered   Influenza,inj,Quad PF,6+ Mos 01/13/2019, 01/04/2020   Influenza-Unspecified 11/24/2018, 11/28/2021, 12/27/2022   PFIZER(Purple Top)SARS-COV-2 Vaccination 06/03/2019, 06/24/2019, 01/10/2020, 01/04/2022   Pneumococcal Polysaccharide-23 10/18/2015   Tdap 06/25/2012    Diabetes He presents for his follow-up diabetic visit.  He has type 2 diabetes mellitus. Onset time: Diagnosed at approx age of 42. His disease course has been stable. There are no hypoglycemic associated symptoms. There are no diabetic associated symptoms. There are no hypoglycemic complications. Diabetic complications include heart disease (CHF), nephropathy and PVD. Risk factors for coronary artery disease include diabetes mellitus, male sex, hypertension, dyslipidemia and family history. Current diabetic treatment includes oral agent (monotherapy) and insulin  injections. He is compliant with treatment most of the time. His weight is fluctuating minimally. He is following a generally healthy diet. Meal planning includes avoidance of concentrated sweets. He has not had a previous visit with a dietitian. He participates in exercise intermittently. His home blood glucose trend is fluctuating minimally. His overall blood glucose range is 140-180 mg/dl. (He presents today with his CGM showing mostly at target glycemic profile.  His POCT A1c today is 7.7%, increasing slightly from last visit of 7%.  Analysis of his CGM shows TIR 72%, TAR 28%, TBR 0% with a GMI of 7.2%.  He denies any hypoglycemia.  He continues to eat healthy.) An ACE inhibitor/angiotensin II receptor blocker is being taken. He sees a podiatrist.Eye exam is current.     Review of systems  Constitutional: + stable body weight, current Body mass index is 24.59 kg/m., no fatigue, no subjective  hyperthermia, no subjective hypothermia Eyes: no blurry vision, no xerophthalmia ENT: no sore throat, no nodules palpated in throat, no dysphagia/odynophagia, no hoarseness Cardiovascular: no chest pain, no shortness of breath, no palpitations, no leg swelling Respiratory: no cough, no shortness of breath Gastrointestinal: no nausea/vomiting/diarrhea Musculoskeletal: no muscle/joint aches Skin: no rashes, no hyperemia, Neurological: no tremors, no numbness, no tingling, no dizziness Psychiatric: no depression, no anxiety  Objective:     BP 118/80 (BP Location: Right Arm, Patient Position: Sitting, Cuff Size: Large)   Pulse 73   Ht 5' 10 (1.778 m)   Wt 171 lb 6.4 oz (77.7 kg)   BMI 24.59 kg/m   Wt Readings from Last 3 Encounters:  11/10/23 171 lb 6.4 oz (77.7 kg)  10/13/23 169 lb 3.2 oz (76.7 kg)  07/10/23 169 lb 3.2 oz (76.7 kg)     BP Readings from Last 3 Encounters:  11/10/23 118/80  07/10/23 (!) 138/94  07/09/23 132/80      Physical Exam- Limited  Constitutional:  Body mass index is 24.59 kg/m. , not in acute distress, normal state of mind Eyes:  EOMI, no exophthalmos Musculoskeletal: no gross deformities, strength intact in all four extremities, no gross restriction of joint movements Skin:  no rashes, no hyperemia Neurological: no tremor with outstretched hands   Diabetic Foot Exam - Simple   No data filed      CMP ( most recent) CMP     Component Value Date/Time   NA 142 01/07/2023 1609   K 4.0 01/07/2023 1609   CL 107 (H) 01/07/2023 1609   CO2 20 01/07/2023 1609   GLUCOSE 165 (H) 01/07/2023 1609   GLUCOSE 105 (H) 07/15/2022 1522   BUN 27 (H) 01/07/2023 1609   CREATININE 1.68 (H) 01/07/2023 1609   CREATININE 1.62 (H) 10/19/2021 1511   CALCIUM  9.6 06/09/2023 0914   PROT 7.9 04/12/2022 0756   ALBUMIN 4.3 04/12/2022 0756   AST 20 04/12/2022 0756   ALT 23 04/12/2022 0756   ALKPHOS 76 04/12/2022 0756   BILITOT 0.6 04/12/2022 0756   GFR 45.95 (L)  04/12/2022 0756   EGFR 43.0 08/14/2023 0724   EGFR 47 (L) 01/07/2023  1609   GFRNONAA 53 (L) 07/15/2022 1522     Diabetic Labs (most recent): Lab Results  Component Value Date   HGBA1C 7.7 (A) 11/10/2023   HGBA1C 7.0 (A) 07/09/2023   HGBA1C 6.7 (A) 04/07/2023   MICROALBUR 41.7 (H) 05/27/2023   MICROALBUR 150mg /L 04/07/2023   MICROALBUR 13.56 (H) 03/20/2012     Lipid Panel ( most recent) Lipid Panel     Component Value Date/Time   CHOL 141 04/12/2022 0756   TRIG 53.0 04/12/2022 0756   HDL 45.90 04/12/2022 0756   CHOLHDL 3 04/12/2022 0756   VLDL 10.6 04/12/2022 0756   LDLCALC 85 04/12/2022 0756   LDLCALC 119 (H) 10/19/2021 1511      Lab Results  Component Value Date   TSH 1.280 04/09/2022   TSH 1.16 08/27/2019   TSH 0.82 05/09/2017   FREET4 0.97 08/27/2019   FREET4 1.1 05/09/2017           Assessment & Plan:   1) Type 2 diabetes mellitus with foot ulcer, with long-term current use of insulin  (HCC)  He presents today with his CGM showing mostly at target glycemic profile.  His POCT A1c today is 7.7%, increasing slightly from last visit of 7%.  Analysis of his CGM shows TIR 72%, TAR 28%, TBR 0% with a GMI of 7.2%.  He denies any hypoglycemia.  He continues to eat healthy.  - Miko Sirico has currently uncontrolled symptomatic type 2 DM since 58 years of age.   -Recent labs reviewed.  - I had a long discussion with him about the progressive nature of diabetes and the pathology behind its complications. -his diabetes is complicated by CHF, CKD stage 3a, diabetic foot ulcer and he remains at a high risk for more acute and chronic complications which include CAD, CVA, CKD, retinopathy, and neuropathy. These are all discussed in detail with him.  The following Lifestyle Medicine recommendations according to American College of Lifestyle Medicine [facility]) were discussed and offered to patient and he agrees to start the journey:  A. Whole Foods, Plant-based plate  comprising of fruits and vegetables, plant-based proteins, whole-grain carbohydrates was discussed in detail with the patient.   A list for source of those nutrients were also provided to the patient.  Patient will use only water or unsweetened tea for hydration. B.  The need to stay away from risky substances including alcohol, smoking; obtaining 7 to 9 hours of restorative sleep, at least 150 minutes of moderate intensity exercise weekly, the importance of healthy social connections,  and stress reduction techniques were discussed. C.  A full color page of  Calorie density of various food groups per pound showing examples of each food groups was provided to the patient.  - Nutritional counseling repeated at each appointment due to patients tendency to fall back in to old habits.  - The patient admits there is a room for improvement in their diet and drink choices. -  Suggestion is made for the patient to avoid simple carbohydrates from their diet including Cakes, Sweet Desserts / Pastries, Ice Cream, Soda (diet and regular), Sweet Tea, Candies, Chips, Cookies, Sweet Pastries, Store Bought Juices, Alcohol in Excess of 1-2 drinks a day, Artificial Sweeteners, Coffee Creamer, and Sugar-free Products. This will help patient to have stable blood glucose profile and potentially avoid unintended weight gain.   - I encouraged the patient to switch to unprocessed or minimally processed complex starch and increased protein intake (animal or plant source), fruits, and vegetables.   -  Patient is advised to stick to a routine mealtimes to eat 3 meals a day and avoid unnecessary snacks (to snack only to correct hypoglycemia).  - I have approached him with the following individualized plan to manage his diabetes and patient agrees:   - he is advised to continue his Tresiba  to 5 units SQ nightly (not quite ready to discontinue this just yet) and continue Farxiga  10 mg po daily.  -he is encouraged to continue  monitoring glucose 2 times daily (using his CGM), before breakfast and before bed, and to call the clinic if he has readings less than 70 or above 300 for 3 tests in a row.  - he is warned not to take insulin  without proper monitoring per orders. - Adjustment parameters are given to him for hypo and hyperglycemia in writing.  - he is not a candidate for Metformin  due to concurrent renal insufficiency.  - he is not an ideal candidate for incretin therapy due to body habitus.  - Specific targets for  A1c; LDL, HDL, and Triglycerides were discussed with the patient.  2) Blood Pressure /Hypertension:  his blood pressure is controlled to target.  He is advised to continue meds as prescribed by PCP/cardiology.  3) Lipids/Hyperlipidemia:    Review of his recent lipid panel from 04/12/22 showed controlled LDL at 85 .  he is advised to continue Lipitor 80 mg daily at bedtime.  Side effects and precautions discussed with him.  He did not have his lipid panel checked between visits as requested.  Will defer to PCP.  4)  Weight/Diet:  his Body mass index is 24.59 kg/m.  -  he is NOT a candidate for weight loss.  Exercise, and detailed carbohydrates information provided  -  detailed on discharge instructions.  5) Chronic Care/Health Maintenance: -he is on ACEI/ARB and Statin medications and is encouraged to initiate and continue to follow up with Ophthalmology, Dentist, Podiatrist at least yearly or according to recommendations, and advised to stay away from smoking. I have recommended yearly flu vaccine and pneumonia vaccine at least every 5 years; moderate intensity exercise for up to 150 minutes weekly; and sleep for at least 7 hours a day.  - he is advised to maintain close follow up with Frann Mabel Mt, DO for primary care needs, as well as his other providers for optimal and coordinated care.     I spent  30  minutes in the care of the patient today including review of labs from CMP,  Lipids, Thyroid  Function, Hematology (current and previous including abstractions from other facilities); face-to-face time discussing  his blood glucose readings/logs, discussing hypoglycemia and hyperglycemia episodes and symptoms, medications doses, his options of short and long term treatment based on the latest standards of care / guidelines;  discussion about incorporating lifestyle medicine;  and documenting the encounter. Risk reduction counseling performed per USPSTF guidelines to reduce obesity and cardiovascular risk factors.     Please refer to Patient Instructions for Blood Glucose Monitoring and Insulin /Medications Dosing Guide  in media tab for additional information. Please  also refer to  Patient Self Inventory in the Media  tab for reviewed elements of pertinent patient history.  Isrrael Fluckiger participated in the discussions, expressed understanding, and voiced agreement with the above plans.  All questions were answered to his satisfaction. he is encouraged to contact clinic should he have any questions or concerns prior to his return visit.     Follow up plan: - Return in about  4 months (around 03/11/2024) for Diabetes F/U with A1c in office, No previsit labs, Bring meter and logs.  Benton Rio, [facility] [address] [place], [place] [address] Phone: [phone] Fax: [phone]  11/10/2023, 3:29 PM

## 2023-11-11 ENCOUNTER — Other Ambulatory Visit (HOSPITAL_COMMUNITY): Payer: Self-pay

## 2023-11-11 MED ORDER — CARVEDILOL 25 MG PO TABS
25.0000 mg | ORAL_TABLET | Freq: Two times a day (BID) | ORAL | 2 refills | Status: AC
  Filled 2023-11-11: qty 180, 90d supply, fill #0
  Filled 2024-02-16: qty 180, 90d supply, fill #1

## 2023-12-10 ENCOUNTER — Ambulatory Visit (INDEPENDENT_AMBULATORY_CARE_PROVIDER_SITE_OTHER): Admitting: Podiatry

## 2023-12-10 ENCOUNTER — Encounter: Payer: Self-pay | Admitting: Podiatry

## 2023-12-10 VITALS — Ht 70.0 in | Wt 171.4 lb

## 2023-12-10 DIAGNOSIS — M79675 Pain in left toe(s): Secondary | ICD-10-CM

## 2023-12-10 DIAGNOSIS — B351 Tinea unguium: Secondary | ICD-10-CM | POA: Diagnosis not present

## 2023-12-10 DIAGNOSIS — D492 Neoplasm of unspecified behavior of bone, soft tissue, and skin: Secondary | ICD-10-CM

## 2023-12-10 DIAGNOSIS — M79674 Pain in right toe(s): Secondary | ICD-10-CM

## 2023-12-10 NOTE — Patient Instructions (Signed)

## 2023-12-10 NOTE — Progress Notes (Signed)
 Chief Complaint  Patient presents with   Nail Problem    Pt is here for [facility]. Also concern with ulcer on the right great toe that he wants to get check out.    Subjective:  Patient presents today status post right great toe IPJ arthrodesis.  DOS: 11/21/2022 with subsequent removal of orthopedic screw to the right great toe performed in the office on 04/29/2023.  Patient doing well.  Presenting for routine footcare  Past Medical History:  Diagnosis Date   Asthma    CAD in native artery    CHF (congestive heart failure) (HCC)    Chronic HFrEF (heart failure with reduced ejection fraction) (HCC)    CKD (chronic kidney disease) stage 2, GFR 60-89 ml/min    Diabetic foot ulcer (HCC)    History of chicken pox    History of DVT (deep vein thrombosis)    s/p trauma of R leg -- 2014. No other history of DVT   History of kidney stones    Hypertension    Lazy eye of left side    NICM (nonischemic cardiomyopathy) (HCC)    Pneumonia    Uncontrolled diabetes mellitus with hyperglycemia [facility])     Past Surgical History:  Procedure Laterality Date   AMPUTATION TOE Left 05/16/2021   Procedure: AMPUTATION LEFT GREAT  TOE;  Surgeon: Gershon Donnice SAUNDERS, DPM;  [facility];  Service: Podiatry;  Laterality: Left;   BONE BIOPSY Right 05/10/2022   Procedure: BONE BIOPSY;  Surgeon: Janit Thresa HERO, DPM;  [facility];  Service: Podiatry;  Laterality: Right;   EXOSTECTECTOMY TOE Right 05/10/2022   Procedure: EXOSTECTECTOMY TOE;  Surgeon: Janit Thresa HERO, DPM;  [facility];  Service: Podiatry;  Laterality: Right;   EYE SURGERY Right    cataract surgery  w/ IOL   RIGHT/LEFT HEART CATH AND CORONARY ANGIOGRAPHY N/A 03/11/2022   Procedure: RIGHT/LEFT HEART CATH AND CORONARY ANGIOGRAPHY;  Surgeon: Mady Bruckner, MD;  [facility];  Service: Cardiovascular;  Laterality: N/A;    Allergies  Allergen Reactions   Bidil  [Isosorb Dinitrate-Hydralazine ] Other (See Comments)    Dropped  BP - but has tolerated lower dose   Zofran  [Ondansetron  Hcl] Nausea Only    Abdominal pain    RT great toe 01/22/2023  RT great toe 01/22/2023  Objective/Physical Exam Vascular status intact.  Callused area to the plantar aspect of the right great toe.  Hyperkeratotic dystrophic nails noted 1-5 bilateral.  History of left great toe amputation  Radiographic Exam RT foot 01/22/2023:  Unchanged.  Rectus alignment of the great toe.  The orthopedic screw does appear to be plantar to the distal phalanx.  There must be some stability at the IPJ and it appears that the screw does cross the base of the proximal phalanx stabilizing the joint.  Assessment: 1. s/p IPJ arthrodesis right great toe. DOS: 11/27/2022.  Subsequent ROH in office.  04/29/2023 2.  Diabetes mellitus with peripheral polyneuropathy 3.  History of left great toe amputation 4.  Preulcerative callus/benign skin lesion lesion bilateral feet 5.  Pain due to onychomycosis of toenails both   Plan of Care:  -Patient was evaluated.  -Excisional debridement of the callus area was performed today using a 312 scalpel to the right great toe.   - Mechanical debridement of nails 1-5 bilateral's performed using a nail nipper without incident or bleeding. -Continue to maintain good foot hygiene -Return to clinic 3 months routine footcare  Thresa EMERSON Janit, DPM  [facility]  Dr. Thresa EMERSON Sar, DPM    [address] [place], [place] [address]                Office [phone]  Fax [phone]

## 2023-12-17 DIAGNOSIS — D631 Anemia in chronic kidney disease: Secondary | ICD-10-CM | POA: Diagnosis not present

## 2023-12-17 DIAGNOSIS — E785 Hyperlipidemia, unspecified: Secondary | ICD-10-CM | POA: Diagnosis not present

## 2023-12-17 DIAGNOSIS — E1122 Type 2 diabetes mellitus with diabetic chronic kidney disease: Secondary | ICD-10-CM | POA: Diagnosis not present

## 2023-12-17 DIAGNOSIS — I129 Hypertensive chronic kidney disease with stage 1 through stage 4 chronic kidney disease, or unspecified chronic kidney disease: Secondary | ICD-10-CM | POA: Diagnosis not present

## 2023-12-17 DIAGNOSIS — N1831 Chronic kidney disease, stage 3a: Secondary | ICD-10-CM | POA: Diagnosis not present

## 2023-12-22 ENCOUNTER — Other Ambulatory Visit: Payer: Self-pay

## 2023-12-22 ENCOUNTER — Ambulatory Visit: Admitting: Family Medicine

## 2023-12-22 ENCOUNTER — Other Ambulatory Visit (HOSPITAL_COMMUNITY): Payer: Self-pay

## 2023-12-22 VITALS — BP 116/78 | HR 74 | Temp 97.8°F | Resp 16 | Ht 70.0 in | Wt 167.0 lb

## 2023-12-22 DIAGNOSIS — Z23 Encounter for immunization: Secondary | ICD-10-CM | POA: Diagnosis not present

## 2023-12-22 DIAGNOSIS — J4520 Mild intermittent asthma, uncomplicated: Secondary | ICD-10-CM | POA: Diagnosis not present

## 2023-12-22 DIAGNOSIS — I10 Essential (primary) hypertension: Secondary | ICD-10-CM

## 2023-12-22 MED ORDER — AIRSUPRA 90-80 MCG/ACT IN AERO
2.0000 | INHALATION_SPRAY | Freq: Four times a day (QID) | RESPIRATORY_TRACT | 2 refills | Status: AC | PRN
  Filled 2023-12-22: qty 10.7, 30d supply, fill #0

## 2023-12-22 NOTE — Patient Instructions (Signed)
 Let me know if there are cost issues with the replacement rescue inhaler.   Keep the diet clean and stay active.  Let us  know if you need anything.

## 2023-12-22 NOTE — Addendum Note (Signed)
 Addended by: Melo Stauber M on: 12/22/2023 02:27 PM   Modules accepted: Orders

## 2023-12-22 NOTE — Progress Notes (Signed)
 Chief Complaint  Patient presents with   Follow-up    Follow Up    Subjective John [name] is a 58 y.o. male who presents for hypertension follow up. He does monitor home blood pressures. Blood pressures ranging from 120-130's/70's on average. He is compliant with medications- Entresto  from cards. Coreg  25 mg bid Patient has these side effects of medication: none He is adhering to a healthy diet overall. Current exercise: active at work No CP or SOB.   Asthma Mild intermittent. Taking albuterol  prn. Rarely takes unless sick. No SOB, coughing, wheezing.    Past Medical History:  Diagnosis Date   Asthma    CAD in native artery    CHF (congestive heart failure) (HCC)    Chronic HFrEF (heart failure with reduced ejection fraction) (HCC)    CKD (chronic kidney disease) stage 2, GFR 60-89 ml/min    Diabetic foot ulcer (HCC)    History of chicken pox    History of DVT (deep vein thrombosis)    s/p trauma of R leg -- 2014. No other history of DVT   History of kidney stones    Hypertension    Lazy eye of left side    NICM (nonischemic cardiomyopathy) (HCC)    Pneumonia    Uncontrolled diabetes mellitus with hyperglycemia (HCC)     Exam BP 116/78 (BP Location: Left Arm, Patient Position: Sitting)   Pulse 74   Temp 97.8 F (36.6 C) (Temporal)   Resp 16   Ht 5' 10 (1.778 m)   Wt 167 lb (75.8 kg)   SpO2 99%   BMI 23.96 kg/m  General:  well developed, well nourished, in no apparent distress HEENT: Ears neg, no sinus ttp, nares patent w/o rhinorrhea, MMM, no exudate/erythema Heart: RRR, no bruits, no LE edema Lungs: clear to auscultation, no accessory muscle use Psych: well oriented with normal range of affect and appropriate judgment/insight  Essential hypertension  Mild intermittent asthma without complication  Chronic, stable.  Continue Entresto  through cardiology.  Continue Coreg  25 mg twice daily.  Counseled on diet and exercise. Chronic, stable.  We will change  albuterol  to Airsupra.  He will let me know if there are cost issues.  Rinse out mouth after use.  PCV 20 today F/u in 6 mo. The patient voiced understanding and agreement to the plan.  Mabel Mt [place], DO 12/22/23  2:20 PM

## 2023-12-23 ENCOUNTER — Other Ambulatory Visit (HOSPITAL_COMMUNITY): Payer: Self-pay

## 2023-12-29 ENCOUNTER — Other Ambulatory Visit: Payer: Self-pay | Admitting: Podiatry

## 2023-12-29 ENCOUNTER — Other Ambulatory Visit (HOSPITAL_COMMUNITY): Payer: Self-pay

## 2023-12-29 MED ORDER — SILVER SULFADIAZINE 1 % EX CREA
1.0000 | TOPICAL_CREAM | Freq: Every day | CUTANEOUS | 0 refills | Status: AC
  Filled 2023-12-29: qty 50, 30d supply, fill #0
  Filled 2024-01-16: qty 50, 25d supply, fill #0

## 2024-01-05 ENCOUNTER — Other Ambulatory Visit (HOSPITAL_COMMUNITY): Payer: Self-pay

## 2024-01-08 ENCOUNTER — Other Ambulatory Visit (HOSPITAL_COMMUNITY): Payer: Self-pay

## 2024-01-14 ENCOUNTER — Telehealth: Payer: Self-pay | Admitting: Family Medicine

## 2024-01-14 ENCOUNTER — Other Ambulatory Visit: Payer: Self-pay

## 2024-01-14 DIAGNOSIS — E0843 Diabetes mellitus due to underlying condition with diabetic autonomic (poly)neuropathy: Secondary | ICD-10-CM

## 2024-01-14 NOTE — Telephone Encounter (Signed)
 Referral was placed

## 2024-01-14 NOTE — Telephone Encounter (Signed)
 OK. Thx

## 2024-01-14 NOTE — Telephone Encounter (Signed)
 Copied from CRM [id number]. Topic: Referral - Request for Referral >> Jan 14, 2024 12:47 PM Ashley R wrote: Did the patient discuss referral with their provider in the last year? Yes  Appointment offered? Yes  Type of order/referral and detailed reason for visit: Neuropathy  Preference of office, provider, location: [facility] (call [phone])  If referral order, have you been seen by this specialty before? No  Can we respond through MyChart? Yes

## 2024-01-16 ENCOUNTER — Other Ambulatory Visit (HOSPITAL_COMMUNITY): Payer: Self-pay

## 2024-01-18 ENCOUNTER — Other Ambulatory Visit: Payer: Self-pay

## 2024-01-19 ENCOUNTER — Other Ambulatory Visit (HOSPITAL_BASED_OUTPATIENT_CLINIC_OR_DEPARTMENT_OTHER): Payer: Self-pay

## 2024-01-19 ENCOUNTER — Other Ambulatory Visit: Payer: Self-pay

## 2024-01-19 ENCOUNTER — Other Ambulatory Visit (HOSPITAL_COMMUNITY): Payer: Self-pay

## 2024-01-20 ENCOUNTER — Other Ambulatory Visit: Payer: Self-pay

## 2024-03-02 ENCOUNTER — Other Ambulatory Visit (HOSPITAL_COMMUNITY): Payer: Self-pay

## 2024-03-02 DIAGNOSIS — G629 Polyneuropathy, unspecified: Secondary | ICD-10-CM | POA: Diagnosis not present

## 2024-03-10 ENCOUNTER — Encounter: Payer: Self-pay | Admitting: Podiatry

## 2024-03-10 ENCOUNTER — Ambulatory Visit: Admitting: Podiatry

## 2024-03-10 VITALS — Ht 70.0 in | Wt 167.0 lb

## 2024-03-10 DIAGNOSIS — B351 Tinea unguium: Secondary | ICD-10-CM

## 2024-03-10 DIAGNOSIS — M79674 Pain in right toe(s): Secondary | ICD-10-CM

## 2024-03-10 DIAGNOSIS — L84 Corns and callosities: Secondary | ICD-10-CM

## 2024-03-10 DIAGNOSIS — M79675 Pain in left toe(s): Secondary | ICD-10-CM | POA: Diagnosis not present

## 2024-03-10 NOTE — Progress Notes (Signed)
 Chief Complaint  Patient presents with   Nail Problem    Pt is here for [facility].    Subjective:  Patient presents today status post right great toe IPJ arthrodesis.  DOS: 11/21/2022 with subsequent removal of orthopedic screw to the right great toe performed in the office on 04/29/2023.  Patient doing well.  Presenting for routine footcare  Past Medical History:  Diagnosis Date   Asthma    CAD in native artery    CHF (congestive heart failure) (HCC)    Chronic HFrEF (heart failure with reduced ejection fraction) (HCC)    CKD (chronic kidney disease) stage 2, GFR 60-89 ml/min    Diabetic foot ulcer (HCC)    History of chicken pox    History of DVT (deep vein thrombosis)    s/p trauma of R leg -- 2014. No other history of DVT   History of kidney stones    Hypertension    Lazy eye of left side    NICM (nonischemic cardiomyopathy) (HCC)    Pneumonia    Uncontrolled diabetes mellitus with hyperglycemia [facility])     Past Surgical History:  Procedure Laterality Date   AMPUTATION TOE Left 05/16/2021   Procedure: AMPUTATION LEFT GREAT  TOE;  Surgeon: Gershon Donnice SAUNDERS, DPM;  Location: [facility] OR;  Service: Podiatry;  Laterality: Left;   BONE BIOPSY Right 05/10/2022   Procedure: BONE BIOPSY;  Surgeon: Janit Thresa HERO, DPM;  Location: [facility] ORS;  Service: Podiatry;  Laterality: Right;   EXOSTECTECTOMY TOE Right 05/10/2022   Procedure: EXOSTECTECTOMY TOE;  Surgeon: Janit Thresa HERO, DPM;  Location: [facility];  Service: Podiatry;  Laterality: Right;   EYE SURGERY Right    cataract surgery  w/ IOL   RIGHT/LEFT HEART CATH AND CORONARY ANGIOGRAPHY N/A 03/11/2022   Procedure: RIGHT/LEFT HEART CATH AND CORONARY ANGIOGRAPHY;  Surgeon: Mady Bruckner, MD;  Location: [facility] INVASIVE CV LAB;  Service: Cardiovascular;  Laterality: N/A;    Allergies  Allergen Reactions   Bidil  [Isosorb Dinitrate-Hydralazine ] Other (See Comments)    Dropped BP - but has tolerated lower dose   Zofran  [Ondansetron  Hcl] Nausea Only     Abdominal pain    RT great toe 01/22/2023  RT great toe 01/22/2023  Objective/Physical Exam Vascular status intact.  Callused area to the plantar aspect of the right great toe.  Hyperkeratotic dystrophic nails noted 1-5 bilateral.  History of left great toe amputation  Radiographic Exam RT foot 01/22/2023:  Unchanged.  Rectus alignment of the great toe.  The orthopedic screw does appear to be plantar to the distal phalanx.  There must be some stability at the IPJ and it appears that the screw does cross the base of the proximal phalanx stabilizing the joint.  Assessment: 1. s/p IPJ arthrodesis right great toe. DOS: 11/27/2022.  Subsequent ROH in office.  04/29/2023 2.  Diabetes mellitus with peripheral polyneuropathy 3.  History of left great toe amputation 4.  Preulcerative callus/benign skin lesion lesion bilateral feet 5.  Pain due to onychomycosis of toenails both   Plan of Care:  -Patient was evaluated.  -Excisional debridement of the callus area was performed today using a 312 scalpel to the right great toe.   - Mechanical debridement of nails 1-5 bilateral's performed using a nail nipper without incident or bleeding. -Continue to maintain good foot hygiene -Return to clinic 3 months routine footcare  Thresa EMERSON Janit, DPM [facility]  Dr. Thresa EMERSON Janit, DPM    [address] [place], [place] [address]                Office [phone]  Fax [phone]

## 2024-03-11 ENCOUNTER — Ambulatory Visit: Admitting: Nurse Practitioner

## 2024-03-24 ENCOUNTER — Other Ambulatory Visit (HOSPITAL_COMMUNITY): Payer: Self-pay

## 2024-03-24 ENCOUNTER — Encounter: Payer: Self-pay | Admitting: Nurse Practitioner

## 2024-03-24 ENCOUNTER — Ambulatory Visit (INDEPENDENT_AMBULATORY_CARE_PROVIDER_SITE_OTHER): Admitting: Nurse Practitioner

## 2024-03-24 VITALS — BP 138/86 | HR 68 | Ht 70.0 in | Wt 174.6 lb

## 2024-03-24 DIAGNOSIS — N1831 Chronic kidney disease, stage 3a: Secondary | ICD-10-CM

## 2024-03-24 DIAGNOSIS — Z7984 Long term (current) use of oral hypoglycemic drugs: Secondary | ICD-10-CM | POA: Diagnosis not present

## 2024-03-24 DIAGNOSIS — E1122 Type 2 diabetes mellitus with diabetic chronic kidney disease: Secondary | ICD-10-CM

## 2024-03-24 DIAGNOSIS — E1165 Type 2 diabetes mellitus with hyperglycemia: Secondary | ICD-10-CM | POA: Diagnosis not present

## 2024-03-24 DIAGNOSIS — Z794 Long term (current) use of insulin: Secondary | ICD-10-CM | POA: Diagnosis not present

## 2024-03-24 LAB — POCT GLYCOSYLATED HEMOGLOBIN (HGB A1C): Hemoglobin A1C: 7.7 % — AB (ref 4.0–5.6)

## 2024-03-24 MED ORDER — DAPAGLIFLOZIN PROPANEDIOL 10 MG PO TABS
10.0000 mg | ORAL_TABLET | Freq: Every day | ORAL | 3 refills | Status: AC
  Filled 2024-03-24 – 2024-04-18 (×4): qty 90, 90d supply, fill #0

## 2024-03-24 NOTE — Progress Notes (Signed)
 "                                                                        Endocrinology Follow UpNote       03/24/2024, 3:32 PM   Subjective:    Patient ID: John [name], male    DOB: Nov 23, 1965.  John [name] is being seen in follow up after being seen in consultation for management of currently uncontrolled symptomatic diabetes requested by  Frann Mabel Mt, DO.  He is a [employer], works in [employer].   Past Medical History:  Diagnosis Date   Asthma    CAD in native artery    CHF (congestive heart failure) (HCC)    Chronic HFrEF (heart failure with reduced ejection fraction) (HCC)    CKD (chronic kidney disease) stage 2, GFR 60-89 ml/min    Diabetic foot ulcer (HCC)    History of chicken pox    History of DVT (deep vein thrombosis)    s/p trauma of R leg -- 2014. No other history of DVT   History of kidney stones    Hypertension    Lazy eye of left side    NICM (nonischemic cardiomyopathy) (HCC)    Pneumonia    Uncontrolled diabetes mellitus with hyperglycemia [facility])     Past Surgical History:  Procedure Laterality Date   AMPUTATION TOE Left 05/16/2021   Procedure: AMPUTATION LEFT GREAT  TOE;  Surgeon: Gershon Donnice SAUNDERS, DPM;  [facility];  Service: Podiatry;  Laterality: Left;   BONE BIOPSY Right 05/10/2022   Procedure: BONE BIOPSY;  Surgeon: Janit Thresa HERO, DPM;  [facility];  Service: Podiatry;  Laterality: Right;   EXOSTECTECTOMY TOE Right 05/10/2022   Procedure: EXOSTECTECTOMY TOE;  Surgeon: Janit Thresa HERO, DPM;  Location[facility];  Service: Podiatry;  Laterality: Right;   EYE SURGERY Right    cataract surgery  w/ IOL   RIGHT/LEFT HEART CATH AND CORONARY ANGIOGRAPHY N/A 03/11/2022   Procedure: RIGHT/LEFT HEART CATH AND CORONARY ANGIOGRAPHY;  Surgeon: Mady Bruckner, MD;  [facility] INV[facility] CV LAB;  Service: Cardiovascular;  Laterality: N/A;    Social History   Socioeconomic History   Marital status: Married    Spouse name: Cathi    Number of children: 1   Years of education: Not on file   Highest education level: 12th grade  Occupational History   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   Tobacco Use   Smoking status: Never   Smokeless tobacco: Never  Vaping Use   Vaping status: Never Used  Substance and Sexual Activity   Alcohol use: No   Drug use: No   Sexual activity: Yes    Partners: Female  Other Topics Concern   Not on file  Social History Narrative   Not on file   Social Drivers of Health   Tobacco Use: Low Risk (03/24/2024)   Patient History    Smoking Tobacco Use: Never    Smokeless Tobacco Use: Never    Passive Exposure: Not on file  Financial Resource Strain: Low Risk (12/22/2023)   Overall Financial Resource Strain (CARDIA)    Difficulty of Paying Living Expenses: Not hard at all  Food Insecurity: No  Food Insecurity (12/22/2023)   Epic    Worried About Programme Researcher, Broadcasting/film/video in the Last Year: Never true    Ran Out of Food in the Last Year: Never true  Transportation Needs: No Transportation Needs (12/22/2023)   Epic    Lack of Transportation (Medical): No    Lack of Transportation (Non-Medical): No  Physical Activity: Inactive (12/22/2023)   Exercise Vital Sign    Days of Exercise per Week: 0 days    Minutes of Exercise per Session: Not on file  Stress: No Stress Concern Present (12/22/2023)   [employer] of Occupational Health - Occupational Stress Questionnaire    Feeling of Stress: Not at all  Social Connections: Socially Isolated (12/22/2023)   Social Connection and Isolation Panel    Frequency of Communication with Friends and Family: Once a week    Frequency of Social Gatherings with Friends and Family: Never    Attends Religious Services: Never    Database Administrator or Organizations: No    Attends Engineer, Structural: Not on file    Marital Status: Married  Depression (PHQ2-9): Low Risk (12/22/2023)   Depression (PHQ2-9)    PHQ-2 Score: 0   Alcohol Screen: Low Risk (03/12/2022)   Alcohol Screen    Last Alcohol Screening Score (AUDIT): 0  Housing: Low Risk (12/22/2023)   Epic    Unable to Pay for Housing in the Last Year: No    Number of Times Moved in the Last Year: 0    Homeless in the Last Year: No  Utilities: Not At Risk (03/09/2022)   AHC Utilities    Threatened with loss of utilities: No  Health Literacy: Not on file    Family History  Problem Relation Age of Onset   Heart disease Mother 4   Arthritis Mother    Heart disease Father 15   Stroke Father    Healthy Brother    Heart disease Maternal Grandmother    Diabetes Maternal Uncle     Outpatient Encounter Medications as of 03/24/2024  Medication Sig   Albuterol -Budesonide  (AIRSUPRA ) 90-80 MCG/ACT AERO Inhale 2 puffs into the lungs every 6 (six) hours as needed (Coughing/shortness of breath/wheezing). Rinse mouth out after use.   aspirin  81 MG chewable tablet Chew 81 mg by mouth daily.   atorvastatin  (LIPITOR) 80 MG tablet Take 1 tablet (80 mg total) by mouth daily.   carvedilol  (COREG ) 25 MG tablet Take 1 tablet (25 mg total) by mouth 2 (two) times daily with a meal.   Continuous Glucose Sensor (FREESTYLE LIBRE 3 PLUS SENSOR) MISC Use to continuously monitor blood glucose, changing the sensor every 15 days.   insulin  degludec (TRESIBA  FLEXTOUCH) 100 UNIT/ML FlexTouch Pen Inject 5 Units into the skin at bedtime.   Insulin  Pen Needle (NOVOFINE PLUS PEN NEEDLE) 32G X 4 MM MISC Use daily to administer insulin .   isosorbide -hydrALAZINE  (BIDIL ) 20-37.5 MG tablet Take 1/2 tablet by mouth 2 (two) times daily.   sacubitril -valsartan  (ENTRESTO ) 97-103 MG Take 1 tablet by mouth 2 (two) times daily.   silver  sulfADIAZINE  (SSD) 1 % cream Apply 1 Application topically daily.   spironolactone  (ALDACTONE ) 25 MG tablet Take 1 tablet (25 mg total) by mouth daily.   [DISCONTINUED] dapagliflozin  propanediol (FARXIGA ) 10 MG TABS tablet Take 1 tablet (10 mg total) by mouth  daily before breakfast.   dapagliflozin  propanediol (FARXIGA ) 10 MG TABS tablet Take 1 tablet (10 mg total) by mouth daily before breakfast.   No facility-administered  encounter medications on file as of 03/24/2024.    ALLERGIES: Allergies  Allergen Reactions   Bidil  [Isosorb Dinitrate-Hydralazine ] Other (See Comments)    Dropped BP - but has tolerated lower dose   Zofran  [Ondansetron  Hcl] Nausea Only    Abdominal pain    VACCINATION STATUS: Immunization History  Administered Date(s) Administered   Influenza,inj,Quad PF,6+ Mos 01/13/2019, 01/04/2020   Influenza-Unspecified 11/24/2018, 11/28/2021, 12/27/2022, 12/20/2023   PFIZER(Purple Top)SARS-COV-2 Vaccination 06/03/2019, 06/24/2019, 01/10/2020, 01/04/2022   PNEUMOCOCCAL CONJUGATE-20 12/22/2023   Pneumococcal Polysaccharide-23 10/18/2015   Tdap 06/25/2012    Diabetes He presents for his follow-up diabetic visit. He has type 2 diabetes mellitus. Onset time: Diagnosed at approx age of 53. His disease course has been stable. There are no hypoglycemic associated symptoms. There are no diabetic associated symptoms. There are no hypoglycemic complications. Diabetic complications include heart disease (CHF), nephropathy and PVD. Risk factors for coronary artery disease include diabetes mellitus, male sex, hypertension, dyslipidemia and family history. Current diabetic treatment includes oral agent (monotherapy) and insulin  injections. He is compliant with treatment most of the time. His weight is fluctuating minimally. He is following a generally healthy diet. Meal planning includes avoidance of concentrated sweets. He has not had a previous visit with a dietitian. He participates in exercise intermittently. His home blood glucose trend is fluctuating minimally. His overall blood glucose range is 140-180 mg/dl. (He presents today with his CGM showing mostly at target glycemic profile.  His POCT A1c today is 7.7%, unchanged from last visit.   Analysis of his CGM shows TIR 71%, TAR 29%, TBR 0% with a GMI of 7.2%.  He denies any hypoglycemia.  He continues to eat healthy.) An ACE inhibitor/angiotensin II receptor blocker is being taken. He sees a podiatrist.Eye exam is current.     Review of systems  Constitutional: + stable body weight, current Body mass index is 25.05 kg/m., no fatigue, no subjective hyperthermia, no subjective hypothermia Eyes: no blurry vision, no xerophthalmia ENT: no sore throat, no nodules palpated in throat, no dysphagia/odynophagia, no hoarseness Cardiovascular: no chest pain, no shortness of breath, no palpitations, no leg swelling Respiratory: no cough, no shortness of breath Gastrointestinal: no nausea/vomiting/diarrhea Musculoskeletal: no muscle/joint aches Skin: no rashes, no hyperemia, Neurological: no tremors, no numbness, no tingling, no dizziness Psychiatric: no depression, no anxiety  Objective:     BP 138/86 (BP Location: Right Arm, Patient Position: Sitting, Cuff Size: Large)   Pulse 68   Ht 5' 10 (1.778 m)   Wt 174 lb 9.6 oz (79.2 kg)   BMI 25.05 kg/m   Wt Readings from Last 3 Encounters:  03/24/24 174 lb 9.6 oz (79.2 kg)  03/10/24 167 lb (75.8 kg)  12/22/23 167 lb (75.8 kg)     BP Readings from Last 3 Encounters:  03/24/24 138/86  12/22/23 116/78  11/10/23 118/80     Physical Exam- Limited  Constitutional:  Body mass index is 25.05 kg/m. , not in acute distress, normal state of mind Eyes:  EOMI, no exophthalmos Musculoskeletal: no gross deformities, strength intact in all four extremities, no gross restriction of joint movements Skin:  no rashes, no hyperemia Neurological: no tremor with outstretched hands   Diabetic Foot Exam - Simple   No data filed      CMP ( most recent) CMP     Component Value Date/Time   NA 142 01/07/2023 1609   K 4.0 01/07/2023 1609   CL 107 (H) 01/07/2023 1609   CO2 20 01/07/2023 1609  GLUCOSE 165 (H) 01/07/2023 1609   GLUCOSE  105 (H) 07/15/2022 1522   BUN 27 (H) 01/07/2023 1609   CREATININE 1.68 (H) 01/07/2023 1609   CREATININE 1.62 (H) 10/19/2021 1511   CALCIUM  9.6 06/09/2023 0914   PROT 7.9 04/12/2022 0756   ALBUMIN 4.3 04/12/2022 0756   AST 20 04/12/2022 0756   ALT 23 04/12/2022 0756   ALKPHOS 76 04/12/2022 0756   BILITOT 0.6 04/12/2022 0756   GFR 45.95 (L) 04/12/2022 0756   EGFR 43.0 08/14/2023 0724   EGFR 47 (L) 01/07/2023 1609   GFRNONAA 53 (L) 07/15/2022 1522     Diabetic Labs (most recent): Lab Results  Component Value Date   HGBA1C 7.7 (A) 03/24/2024   HGBA1C 7.7 (A) 11/10/2023   HGBA1C 7.0 (A) 07/09/2023   MICROALBUR 41.7 (H) 05/27/2023   MICROALBUR 150mg /L 04/07/2023   MICROALBUR 13.56 (H) 03/20/2012     Lipid Panel ( most recent) Lipid Panel     Component Value Date/Time   CHOL 141 04/12/2022 0756   TRIG 53.0 04/12/2022 0756   HDL 45.90 04/12/2022 0756   CHOLHDL 3 04/12/2022 0756   VLDL 10.6 04/12/2022 0756   LDLCALC 85 04/12/2022 0756   LDLCALC 119 (H) 10/19/2021 1511      Lab Results  Component Value Date   TSH 1.280 04/09/2022   TSH 1.16 08/27/2019   TSH 0.82 05/09/2017   FREET4 0.97 08/27/2019   FREET4 1.1 05/09/2017           Assessment & Plan:   1) Type 2 diabetes mellitus with foot ulcer, with long-term current use of insulin  (HCC)  He presents today with his CGM showing mostly at target glycemic profile.  His POCT A1c today is 7.7%, unchanged from last visit.  Analysis of his CGM shows TIR 71%, TAR 29%, TBR 0% with a GMI of 7.2%.  He denies any hypoglycemia.  He continues to eat healthy.  - John [name] has currently uncontrolled symptomatic type 2 DM since 58 years of age.   -Recent labs reviewed.  - I had a long discussion with him about the progressive nature of diabetes and the pathology behind its complications. -his diabetes is complicated by CHF, CKD stage 3a, diabetic foot ulcer and he remains at a high risk for more acute and chronic  complications which include CAD, CVA, CKD, retinopathy, and neuropathy. These are all discussed in detail with him.  The following Lifestyle Medicine recommendations according to American College of Lifestyle Medicine [facility]) were discussed and offered to patient and he agrees to start the journey:  A. Whole Foods, Plant-based plate comprising of fruits and vegetables, plant-based proteins, whole-grain carbohydrates was discussed in detail with the patient.   A list for source of those nutrients were also provided to the patient.  Patient will use only water or unsweetened tea for hydration. B.  The need to stay away from risky substances including alcohol, smoking; obtaining 7 to 9 hours of restorative sleep, at least 150 minutes of moderate intensity exercise weekly, the importance of healthy social connections,  and stress reduction techniques were discussed. C.  A full color page of  Calorie density of various food groups per pound showing examples of each food groups was provided to the patient.  - Nutritional counseling repeated/built upon at each appointment.  - The patient admits there is a room for improvement in their diet and drink choices. -  Suggestion is made for the patient to avoid simple carbohydrates from their  diet including Cakes, Sweet Desserts / Pastries, Ice Cream, Soda (diet and regular), Sweet Tea, Candies, Chips, Cookies, Sweet Pastries, Store Bought Juices, Alcohol in Excess of 1-2 drinks a day, Artificial Sweeteners, Coffee Creamer, and Sugar-free Products. This will help patient to have stable blood glucose profile and potentially avoid unintended weight gain.   - I encouraged the patient to switch to unprocessed or minimally processed complex starch and increased protein intake (animal or plant source), fruits, and vegetables.   - Patient is advised to stick to a routine mealtimes to eat 3 meals a day and avoid unnecessary snacks (to snack only to correct  hypoglycemia).  - I have approached him with the following individualized plan to manage his diabetes and patient agrees:   - he is advised to continue his Tresiba  5 units SQ nightly (not quite ready to discontinue this just yet) and continue Farxiga  10 mg po daily.  May look to D/c insulin  at next visit.  -he is encouraged to continue monitoring glucose 2 times daily (using his CGM), before breakfast and before bed, and to call the clinic if he has readings less than 70 or above 300 for 3 tests in a row.  - he is warned not to take insulin  without proper monitoring per orders. - Adjustment parameters are given to him for hypo and hyperglycemia in writing.  - he is not a candidate for Metformin  due to concurrent renal insufficiency.  - he is not an ideal candidate for incretin therapy due to body habitus.  - Specific targets for  A1c; LDL, HDL, and Triglycerides were discussed with the patient.  2) Blood Pressure /Hypertension:  his blood pressure is controlled to target.  He is advised to continue meds as prescribed by PCP/cardiology.  3) Lipids/Hyperlipidemia:    Review of his recent lipid panel from 04/12/22 showed controlled LDL at 85 .  he is advised to continue Lipitor 80 mg daily at bedtime.  Side effects and precautions discussed with him.  Will check lipid panel prior to next visit.  4)  Weight/Diet:  his Body mass index is 25.05 kg/m.  -  he is NOT a candidate for weight loss.  Exercise, and detailed carbohydrates information provided  -  detailed on discharge instructions.  5) Chronic Care/Health Maintenance: -he is on ACEI/ARB and Statin medications and is encouraged to initiate and continue to follow up with Ophthalmology, Dentist, Podiatrist at least yearly or according to recommendations, and advised to stay away from smoking. I have recommended yearly flu vaccine and pneumonia vaccine at least every 5 years; moderate intensity exercise for up to 150 minutes weekly; and  sleep for at least 7 hours a day.  - he is advised to maintain close follow up with Frann Mabel Mt, DO for primary care needs, as well as his other providers for optimal and coordinated care.     I spent  31  minutes in the care of the patient today including review of labs from CMP, Lipids, Thyroid  Function, Hematology (current and previous including abstractions from other facilities); face-to-face time discussing  his blood glucose readings/logs, discussing hypoglycemia and hyperglycemia episodes and symptoms, medications doses, his options of short and long term treatment based on the latest standards of care / guidelines;  discussion about incorporating lifestyle medicine;  and documenting the encounter. Risk reduction counseling performed per USPSTF guidelines to reduce obesity and cardiovascular risk factors.     Please refer to Patient Instructions for Blood Glucose Monitoring and Insulin /Medications  Dosing Guide  in media tab for additional information. Please  also refer to  Patient Self Inventory in the Media  tab for reviewed elements of pertinent patient history.  John [name] participated in the discussions, expressed understanding, and voiced agreement with the above plans.  All questions were answered to his satisfaction. he is encouraged to contact clinic should he have any questions or concerns prior to his return visit.     Follow up plan: - Return in about 6 months (around 09/21/2024) for Diabetes F/U with A1c in office, Previsit labs, Bring meter and logs.  Benton Rio, [facility] [address] [place], [place] [address] Phone: [phone] Fax: [phone]  03/24/2024, 3:32 PM    "

## 2024-03-31 ENCOUNTER — Other Ambulatory Visit (HOSPITAL_COMMUNITY): Payer: Self-pay

## 2024-04-02 ENCOUNTER — Other Ambulatory Visit (HOSPITAL_COMMUNITY): Payer: Self-pay

## 2024-04-18 ENCOUNTER — Other Ambulatory Visit (HOSPITAL_COMMUNITY): Payer: Self-pay

## 2024-04-19 ENCOUNTER — Encounter: Payer: Self-pay | Admitting: Pharmacy Technician

## 2024-04-19 ENCOUNTER — Other Ambulatory Visit (HOSPITAL_COMMUNITY): Payer: Self-pay

## 2024-04-19 ENCOUNTER — Other Ambulatory Visit: Payer: Self-pay

## 2024-04-21 ENCOUNTER — Other Ambulatory Visit (HOSPITAL_COMMUNITY): Payer: Self-pay

## 2024-04-22 ENCOUNTER — Other Ambulatory Visit: Payer: Self-pay

## 2024-04-27 ENCOUNTER — Other Ambulatory Visit: Payer: Self-pay

## 2024-04-27 ENCOUNTER — Other Ambulatory Visit (HOSPITAL_COMMUNITY): Payer: Self-pay

## 2024-06-09 ENCOUNTER — Ambulatory Visit: Admitting: Podiatry

## 2024-09-22 ENCOUNTER — Ambulatory Visit: Admitting: Nurse Practitioner
